# Patient Record
Sex: Female | Born: 1955
Health system: Southern US, Community
[De-identification: ages and names within clinical notes are randomized; demographics above are authoritative.]

## PROBLEM LIST (undated history)

## (undated) DIAGNOSIS — D649 Anemia, unspecified: Secondary | ICD-10-CM

## (undated) DIAGNOSIS — Z87442 Personal history of urinary calculi: Secondary | ICD-10-CM

## (undated) DIAGNOSIS — J45909 Unspecified asthma, uncomplicated: Secondary | ICD-10-CM

## (undated) DIAGNOSIS — J449 Chronic obstructive pulmonary disease, unspecified: Secondary | ICD-10-CM

## (undated) DIAGNOSIS — F32A Depression, unspecified: Secondary | ICD-10-CM

## (undated) DIAGNOSIS — I10 Essential (primary) hypertension: Secondary | ICD-10-CM

## (undated) DIAGNOSIS — K219 Gastro-esophageal reflux disease without esophagitis: Secondary | ICD-10-CM

## (undated) DIAGNOSIS — F419 Anxiety disorder, unspecified: Secondary | ICD-10-CM

## (undated) DIAGNOSIS — E039 Hypothyroidism, unspecified: Secondary | ICD-10-CM

## (undated) HISTORY — PX: ABDOMINAL HYSTERECTOMY: SHX81

## (undated) HISTORY — PX: CHOLECYSTECTOMY: SHX55

---

## 2002-11-28 ENCOUNTER — Encounter: Admission: RE | Admit: 2002-11-28 | Discharge: 2002-11-28 | Payer: Self-pay | Admitting: Family Medicine

## 2002-11-28 ENCOUNTER — Encounter: Payer: Self-pay | Admitting: Family Medicine

## 2002-12-03 ENCOUNTER — Encounter: Payer: Self-pay | Admitting: Family Medicine

## 2002-12-03 ENCOUNTER — Encounter: Admission: RE | Admit: 2002-12-03 | Discharge: 2002-12-03 | Payer: Self-pay | Admitting: Family Medicine

## 2006-11-20 ENCOUNTER — Inpatient Hospital Stay (HOSPITAL_COMMUNITY): Admission: EM | Admit: 2006-11-20 | Discharge: 2006-11-30 | Payer: Self-pay | Admitting: Emergency Medicine

## 2006-11-23 ENCOUNTER — Ambulatory Visit: Payer: Self-pay | Admitting: Internal Medicine

## 2006-11-27 ENCOUNTER — Encounter (INDEPENDENT_AMBULATORY_CARE_PROVIDER_SITE_OTHER): Payer: Self-pay | Admitting: *Deleted

## 2006-12-12 ENCOUNTER — Ambulatory Visit: Payer: Self-pay | Admitting: Internal Medicine

## 2006-12-12 LAB — CONVERTED CEMR LAB
ALT: 32 units/L (ref 0–40)
AST: 22 units/L (ref 0–37)
Alkaline Phosphatase: 129 units/L — ABNORMAL HIGH (ref 39–117)
Total Protein: 7.1 g/dL (ref 6.0–8.3)

## 2007-05-17 ENCOUNTER — Ambulatory Visit: Payer: Self-pay | Admitting: Internal Medicine

## 2007-06-14 ENCOUNTER — Encounter: Admission: RE | Admit: 2007-06-14 | Discharge: 2007-06-14 | Payer: Self-pay | Admitting: Family Medicine

## 2010-08-17 ENCOUNTER — Emergency Department (HOSPITAL_COMMUNITY): Admission: EM | Admit: 2010-08-17 | Discharge: 2010-08-18 | Payer: Self-pay | Admitting: Emergency Medicine

## 2011-03-25 NOTE — Discharge Summary (Signed)
NAME:  Gwendolyn [name], Gwendolyn [name]            ACCOUNT NO.:  [id number]   MEDICAL RECORD NO.:  [id number]          PATIENT TYPE:  INP   LOCATION:  5731                         FACILITY:  [facility]   PHYSICIAN:  Madaline Savage, MD        DATE OF BIRTH:  Aug 22, 1956   DATE OF ADMISSION:  11/19/2006  DATE OF DISCHARGE:                               DISCHARGE SUMMARY   INTERIM DISCHARGE SUMMARY   Date discharge yet to be determined.   PRIMARY CARE PHYSICIAN:  Dr. Artis Flock.   CONSULTATION IN THE HOSPITAL:  1. Dr. Rennis Harding from surgery.  2. Dr. Leone Payor from gastroenterology.   DISCHARGE DIAGNOSES:  1. Acute biliary pancreatitis.  2. Cholelithiasis status post laparoscopic cholecystectomy.  3. Hypothyroidism.  4. Malnutrition.  5. Tobacco abuse.   DISCHARGE MEDICATIONS:  This will be dictated at the time of discharge.   HISTORY OF PRESENT ILLNESS:  Gwendolyn [name] is a 55 year old lady who  presented with epigastric pain and nausea and vomiting which started the  day prior to admission. She states she had a similar episode around  Christmas, but it was a lesser intensity.   The initial work up showed a lipase of 1700 and her white count was  elevated at 19.2.   She had an ultrasound of the gallbladder which showed gallstones.   She was admitted with the diagnosis of gallstone pancreatitis and GI and  surgery was consulted.   PROCEDURES DONE IN THE HOSPITAL:  1. She had laparoscopic cholecystectomy done on the 20th of January      2008 by Dr. Jamey Ripa.  2. She had an ultrasound of the abdomen done on the 14th of January      2008 which showed cholelithiasis, left kidney lower pole calculus.      She had a CT of the abdomen done on the 14th of January 2008 which      showed edema and fluid within the anterior hepatorenal space which      is most likely secondary to pancreatitis. There was no evidence of      necrosis or peripancreatic fluid collection. Gallstones without      evidence of cholecystitis.  Bilateral nonobstructive renal calculi      and small amount of ascites.  3. She had a repeat CT scan done on the 16th of January 2008 which      showed interval worsening of acute pancreatitis compared to the CT      2 days ago.  4. In the intraoperative cholangiogram done on the 20th of January      2008, it showed lack of opacification of the distal common duct,      but there was no flow of contrast into the duodenum. There is a      suggestion of multiple, small poorly visualized filling defects in      the distal common duct suspicious for retained ductal stones.   PROBLEM LIST:  1. Biliary pancreatitis. This is a 55 year old lady who came in with      abdominal pain, nausea and vomiting, elevated amylase, lipase. She  was admitted. She was given bowel rest. Gastroenterology and      surgery was consulted. Surgery wanted to go ahead and take the      gallbladder out as she had gallstones. She was given intravenous      fluids and we started total parenteral nutrition on her. We kept      her nothing by mouth. We repeated a second CT scan which showed      worsening of the pancreatitis, but she clinically improved and on      the 20th of January 2008, the cholecystectomy was done. An      intraoperative cholangiogram was done which showed multiple, small      filling defects which is suspicious of retained stones. So,      gastroenterology was reconsulted to do a possible ERCP. At this      point of time, gastroenterology is planning to do the ERCP and we      will await the results of the ERCP at this point of time.  2. Cholelithiasis. She did not have any evidence of cholecystitis, but      with her biliary pancreatitis, we did a laparoscopic      cholecystectomy on her. She is doing well postoperatively.  3. Hypothyroidism. Will continue on Synthroid as before.  4. Nutrition. At this point of time, she is on total parenteral      nutrition. We are waiting for the ERCP to be  done. Once the ERCP is      done and she clinically improves, we will start her on by mouth      diet and we will wean off the total parenteral nutrition as she      increases her by mouth intake.   DISPOSITION:  This will be dictated in detail at the time of discharge.  At this time, we are waiting on the ERCP and for to improve her by mouth  intake. Please see the final dictation at the time of discharge for a  complete list of discharge diagnoses and discharge medications.      Madaline Savage, MD  Electronically Signed     PKN/MEDQ  D:  11/28/2006  T:  11/28/2006  Job:  [id number]

## 2011-03-25 NOTE — Consult Note (Signed)
NAME:  Gwendolyn [name], Gwendolyn [name]            ACCOUNT NO.:  [id number]   MEDICAL RECORD NO.:  [id number]          PATIENT TYPE:  INP   LOCATION:  5731                         FACILITY:  [facility]   PHYSICIAN:  Iva Boop, MD,FACGDATE OF BIRTH:  31-Oct-1956   DATE OF CONSULTATION:  11/20/2006  DATE OF DISCHARGE:                                 CONSULTATION   Requesting physician is Dr. Corky Downs, [facility] hospitalist, who is  admitting for  Dr. Artis Flock.   REASON FOR CONSULTATION:  Pancreatitis.   ASSESSMENT:  A 55 year old white woman with cholelithiasis and what  looks like gallstone pancreatitis. She does not appear to have  choledochal lithiasis. CT scan has shown changes of pancreatitis but no  dilated bile duct either.   RECOMMENDATIONS/PLAN:  1. Agree with your current care.  2. Would proceed with surgical consultation as I think cholecystectomy      would be appropriate.  3. No role for ERCP at this time, but we will monitor.  4. She is over 55 and colon cancer screening ought to be considered at      some point when she recovers. She does have a family history of      carcinoid tumors of the gut in her mother, maternal grandfather,      and an uncle. I do not think this changes any of  her screening      indications. The chart did indicate a family history of colon      cancer, but it is carcinoid.   HISTORY OF PRESENT ILLNESS:  A 55 year old white woman that presented  with epigastric pain that started the day before admission (January 13  or January 14) associated with nausea and vomiting. She had been having  some symptoms on and off postprandial in nature. She vomited twice on  the day of admission. Her symptoms really all began intermittently with  the pain at around Christmastime. She was brought to the emergency room  complaining of severe epigastric and left upper quadrant pain. She says  she feels about the same at this point though somewhat better perhaps.  She did vomit  today after drinking CT contrast.   DRUG ALLERGIES:  MORPHINE.   MEDICATIONS:  1. Lovenox 40 mg daily.  2. Synthroid 150 mcg daily.  3. Nicoderm patch 21 mg daily.  4. Protonix 40 mg IV daily.  5. Dilaudid.  6. Zofran,  7. Phenergan p.r.n.   PAST MEDICAL HISTORY:  Hypothyroidism, smoker, cesarean section x2,  total abdominal hysterectomy, prior lithotripsy for kidney stones.   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 She smokes a pack per day.   FAMILY HISTORY:  Carcinoid tumor is as above.   REVIEW OF SYSTEMS:  She feels a little weak. She has those things  mentioned above. All other systems are negative.   PHYSICAL EXAMINATION:  GENERAL:  Reveals a slightly ill-appearing middle-  aged white woman.  VITAL SIGNS:  Temperature 98.7, pulse 87, respirations 16, blood  pressure 118/68.  HEENT:  Eyes anicteric. ENT:  Normal mouth and  pharynx.  NECK:  Supple, no thyroid mass.  CHEST:  Clear.  HEART:  S1, S2, no murmurs, rubs or gallop.  ABDOMEN:  Soft. It is mildly distended. Bowel sounds are  quiet/hypoactive. She is tender in the epigastrium and left upper  quadrant without guarding or rebound.  EXTREMITIES:  Show no cyanosis, clubbing or edema.  NEUROLOGICAL/PSYCHIATRIC:  She is alert and oriented x3.  LYMPH NODES:  No neck or supraclavicular nodes palpated.   LABORATORY STUDIES:  Lipase was 1796 and 1185 today. Bilirubin 0.8-0.7,  alk phos 89-70. AST 88-51, ALT 132-96. White blood cell count is 19,000,  hemoglobin 16, platelets 248. BMET was normal except glucose 112. She  has a cardiac panel that is negative. Urinalysis shows 3-6 white cells,  a few squames, small amount of leukocytes on the dip, 30 mg of protein.  Specific gravity 1.023.   I appreciate the opportunity to care for this patient.      Iva Boop, MD,FACG  Electronically Signed     CEG/MEDQ  D:  11/20/2006  T:  11/21/2006  Job:  [id number]   cc:   Mobolaji  B. Corky Downs, M.D.  Hulan Saas, M.D.

## 2011-03-25 NOTE — Discharge Summary (Signed)
NAME:  Gwendolyn [name], Gwendolyn [name]            ACCOUNT NO.:  [id number]   MEDICAL RECORD NO.:  [id number]          PATIENT TYPE:  INP   LOCATION:  5734                         FACILITY:  [facility]   PHYSICIAN:  Beckey Rutter, MD  DATE OF BIRTH:  1956/01/28   DATE OF ADMISSION:  11/19/2006  DATE OF DISCHARGE:  11/30/2006                               DISCHARGE SUMMARY   ADDENDUM:   HOSPITAL COURSE:  This is the addendum to the discharge summary dictated  previously by Dr. Oneita Hurt from the period of November 28, 2006 up to  the discharge date on December 01, 2006.  The patient was continued in TNA.  The TNA was stopped when the patient  was eating and she was tolerating p.o. intake in a good manner.  The  patient had ERCP done with removal of common bile duct stone.   DISCHARGE DIAGNOSIS:  Please refer to the previously dictated discharge  summary.   DISCHARGE MEDICATIONS:  1. Vicodin for pain control p.r.n.  2. Prevacid 30 mg p.o. daily.   PLAN:  The patient is stable for discharge today to follow up with her  primary and her gastroenterologist as discussed with her.      Beckey Rutter, MD  Electronically Signed     EME/MEDQ  D:  02/02/2007  T:  02/03/2007  Job:  [id number]

## 2011-03-25 NOTE — Op Note (Signed)
NAME:  Gwendolyn [name], Gwendolyn [name]            ACCOUNT NO.:  [id number]   MEDICAL RECORD NO.:  [id number]          PATIENT TYPE:  INP   LOCATION:  5731                         FACILITY:  [facility]   PHYSICIAN:  Currie Paris, M.D.DATE OF BIRTH:  02-18-1956   DATE OF PROCEDURE:  11/26/2006  DATE OF DISCHARGE:                               OPERATIVE REPORT   PREOPERATIVE DIAGNOSIS:  Biliary pancreatitis, cholelithiasis.   POSTOPERATIVE DIAGNOSIS:  Biliary pancreatitis, cholelithiasis with  acute cholecystitis.   OPERATION:  Laparoscopic cholecystectomy with operative cholangiogram.   SURGEON:  Dr. Jamey Ripa   ASSISTANT:  Dr. Abbey Chatters   ANESTHESIA:  General.   CLINICAL HISTORY:  This is a 50-year lady recently presented with  biliary pancreatitis.  Clinically she was improving, although her lipase  and white count were still slightly elevated.  She was having no pain,  was hungry, and abdomen was benign.  She is known to have gallstones.  We elected at this time to proceed with her laparoscopic  cholecystectomy.   DESCRIPTION OF PROCEDURE:  The patient was seen in the holding area.  She had no further questions.  She is taken into the operating room  after satisfactory general endotracheal anesthesia had been obtained the  abdomen was prepped and draped.  The time-out occurred.   0.25% plain Marcaine was used for each incision.  The umbilical incision  was made, the fascia opened and the peritoneal cavity entered.  A  pursestring was placed, Hasson introduced and the abdomen insufflated to  15.   There were a few omental adhesions to the midline from prior surgery but  I was able to get around those easily and visualized the upper abdomen.  With the patient reverse Trendelenburg and tilted to the left I was able  to place a 10/11 trocar in the epigastrium and two 5 mm laterally under  direct vision.  The gallbladder was chronically inflamed with some edema  and some signs of early  inflammation as well.   Multiple omental adhesions were taken down and the gallbladder was able  to be retracted over the liver.  I was able to open the peritoneum on  either side of the triangle of Calot and identify nice long cystic duct  and cystic artery.  I placed a single clip on the cystic artery in its  mid position and one on the cystic duct at the junction with the  gallbladder.   A Cook catheter was introduced percutaneously.  The cystic duct was  opened and the catheter placed in it and held with a clip.   Angiography showed good filling of the common duct and hepatic radicals.  We had no definite filling defects and a fairly long segment of cystic  duct.  There was initially no flow of dye into the duodenum.  I could  not see definite stone distally however.   We injected 1 ampule of Glucagon and I redid the films at 2 and 4  minutes post injection and again found no dye going into the duodenum.  I thought this represented spasm but there could have been some small  stones as well.   The cystic duct catheter was removed and three clips placed across the  stay side of the cystic duct being sure we had these well across.  The  cystic duct was divided.  Additional clips were placed on the cystic  artery and it was divided.  Small posterior branch was identified,  clipped and divided and the gallbladder removed from below to above with  coagulation current cautery.   I placed it in a bag temporarily and then irrigated and made sure the  bed of the gallbladder was completely dry.  Once that was the case.  I  brought this out the umbilical port.   We reoccluded the umbilical port and did a final irrigation final check  for hemostasis and again things remained dry with no evidence of  bleeding or bile leaks.  The lateral ports were removed under direct  vision.  There was no bleeding.  The umbilical port was closed with the  pursestring keeping the camera in epigastric port  to be sure no bowel  got incorporated in the closure.  The abdomen was then deflated through  the epigastric port.  Skin was closed with 4-0 Monocryl subcuticular  plus Dermabond.   The patient tolerated the procedure well.  There were no operative  complications.  All counts were correct.      Currie Paris, M.D.  Electronically Signed     CJS/MEDQ  D:  11/26/2006  T:  11/26/2006  Job:  [id number]   cc:   Iva Boop, MD,FACG  Quita Skye. Artis Flock, M.D.

## 2011-03-25 NOTE — Consult Note (Signed)
NAME:  Gwendolyn [name], Gwendolyn [name]            ACCOUNT NO.:  [id number]   MEDICAL RECORD NO.:  [id number]          PATIENT TYPE:  INP   LOCATION:  5731                         FACILITY:  [facility]   PHYSICIAN:  Revonda Standard L. Rennis Harding, N.P. DATE OF BIRTH:  December 09, 1955   DATE OF CONSULTATION:  DATE OF DISCHARGE:                                 CONSULTATION   CONSULTING SURGEON:  Dr. Zachery Dakins.   PRIMARY CARE PHYSICIAN:  Dr. Penni Bombard.   GASTROINTESTINAL PHYSICIAN:  Dr. Leone Payor.   REASON FOR CONSULTATION:  Biliary pancreatitis.   HISTORY OF PRESENT ILLNESS:  Gwendolyn [name] is a 50-year female patient,  history of hypothyroidism, who has been experiencing intermittent  abdominal pain with nausea, at least 4 to 5 episodes since Christmas of  this year.  She had a significant episode on November 19, 2006 after  eating.  This prompted an evaluation at the ER, because of severe left  upper quadrant pain.  Evaluation there revealed a lipase greater than  1,700, white count 19,200.  The patient was admitted by [facility].  Diagnostic workup included ultrasound and CT of the  abdomen and pelvis on January 14.  Both of these were consistent with  pancreatitis, no necrosis, and cholelithiasis without evidence of acute  cholecystitis.  Gastroenterology was consulted, and after their  evaluation they recommended surgical evaluation for biliary  pancreatitis.   REVIEW OF SYSTEMS:  As above, the patient was having fevers at home  without chills or myalgias, otherwise noncontributory.   PAST MEDICAL HISTORY:  Hypothyroidism.   PAST SURGICAL HISTORY:  C section x2 and hysterectomy.   SOCIAL HISTORY:  Positive for tobacco.  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   FAMILY HISTORY:  Noncontributory.   ALLERGIES:  Morphine.   CURRENT MEDICATIONS:  Include:  1. Synthroid.  2. Nicotine patch.  3. Subcu Lovenox for DVT prophylaxis/  4. Protonix IV and several p.r.n. medications.   PHYSICAL EXAM:   GENERAL:  Pleasant female patient still complaining of  left upper quadrant pain, although decreased from prior to admission.  VITAL SIGNS:  Temperature 98.5, BP 133/63, pulse 89 and regular,  respirations 20.  NEURO:  The patient is alert, oriented x3, moving all extremities x4  without focal deficits.  HEENT:  Head normocephalic, sclera non-injected, nonicteric.  NECK:  Supple.  No adenopathy.  CHEST:  Bilateral lung sounds clear to auscultation.  Respiratory effort  is non-labored.  She is on room air.  CARDIAC:  S1, S2 without rubs, murmurs, thrills or gallops.  No JVD.  Pulses is regular, non-tachycardiac.  ABDOMEN:  Soft, nondistended, tender in the left upper quadrant with  mild guarding, no rebounding.  Bowel sounds are present.  No obvious  hepatoplenomegaly, masses or bruits on clinical exam.  EXTREMITIES:  Symmetrical in appearance without edema, cyanosis or  clubbing.  Pulses are palpable bilaterally.   LABORATORY:  Hemoglobin A1c is 5.4.  Urine cultures pending.  Urinalysis  is consistent with borderline UTI.  Lipase is down to 1,185.  AST was  88, now 51.  ALT 132, now 96.  Total bilirubin  has been normal today to  0.7.  Sodium 137, potassium 4.0, CO2 26, glucose 112, BUN 15, creatinine  0.72.  White count on admission 19,200, hemoglobin 16.1, platelets  248,000.  Diagnostic CT and ultrasound of the abdomen and pelvis region  again showed stones without evidence of cholecystitis and pancreatitis.   IMPRESSION:  1. Acute biliary pancreatitis.  2. Cholelithiasis without evidence of acute cholecystitis.  3. Hypothyroidism.   PLAN:  1. Will proceed to the OR later this week, after the patient's      pancreatitis improves.  2. Will go ahead and check CMET, CBC, lipase and amylase today, as      well as in the morning.  3. Recommend continuing n.p.o. status for now.      Allison L. Rennis Harding, N.P.     ALE/MEDQ  D:  11/21/2006  T:  11/21/2006  Job:  [id number]   cc:    Dr. Penni Bombard

## 2011-03-25 NOTE — Consult Note (Signed)
NAME:  Gwendolyn [name], Gwendolyn [name]            ACCOUNT NO.:  [id number]   MEDICAL RECORD NO.:  [id number]          PATIENT TYPE:  INP   LOCATION:  5731                         FACILITY:  [facility]   PHYSICIAN:  Hedwig Morton. Juanda Chance, MD     DATE OF BIRTH:  1956/06/08   DATE OF CONSULTATION:  DATE OF DISCHARGE:                                 CONSULTATION   NAME OF PROCEDURE:  ERCP, sphincterotomy, and stone extraction.   INDICATIONS:  This 55 year old female was hospitalized with acute  biliary pancreatitis.  She was treated with conservative measures, which  included bowel rest, antibiotics, and central TNA.  She underwent  cholecystectomy, laparoscopic cholecystotomy for cholelithiasis 3 days  ago.  Intraoperative cholangiogram showed failure of the radial PEG  material to reach the duodenum raising a question of retaining common  bile duct stones versus pancreatitis obstructing the common bile duct.  Preoperatively, her liver function test and pancreatic enzymes improved,  but since the surgery the amylase as well as the enzymes have been  elevated.  Patient is undergoing ERCP to rule out possibility of  retained common bile duct stone.   ENDOSCOPE:  Olympus single-channel side-viewing duodenoscope.   SEDATION:  Versed 18 mg IV, fentanyl 175 mcg IV, and Glucagon 0.5 mg IV.   PROCEDURE IN DETAIL:  Olympus single-channel side-viewing duodenoscope  passed blindly through the posterior pharynx to the esophagus, through  the stomach, and into the duodenum.  A minor papilla was identified  lying proximally to the major papilla.  Major papilla was visualized  immediately and was rather edematous.  The duodenum itself appeared  normal.  It was cannulated without difficulty.  Bile was exuding from  the papilla.  Common bile duct was cannulated with a guidewire, and a  cholangiogram showed normal-sized common bile duct with questionable  retained distal common bile duct stone.  A standard sphincterotomy was  done over the guidewire, and a 15-mm Wilson-Coojk  balloon passed into  the common bile duct under fluoroscopic guidance. The bile duct was  swept several time with the recovery of a solitary 8-mm stone.  Photographs of the stone were obtained.  Additionally, 2 sweeps of the  bile duct showed the common bile duct to be free of any retained  material.  At that point, procedure was terminated.  Patient tolerated  the procedure well.   IMPRESSION:  1. Choledocholithiasis.  2. Status post endoscopic retrograde cholangiopancreatography with      sphincterotomy and sweep of the bile duct      with a 15-mm balloon.  3. Intentional non visualization of the main pancreatic duct.   PLAN:  See patient's chart for further plan of treatment.      Hedwig Morton. Juanda Chance, MD  Electronically Signed     DMB/MEDQ  D:  11/28/2006  T:  11/28/2006  Job:  [id number]   cc:   Currie Paris, M.D.  Quita Skye Artis Flock, M.D.

## 2011-03-25 NOTE — Assessment & Plan Note (Signed)
[facility]                         GASTROENTEROLOGY OFFICE NOTE   NAME:[name], Gwendolyn LAFAVE                   MRN:          [id number]  DATE:12/12/2006                            DOB:          12-28-1955    CHIEF COMPLAINT:  Followup after pancreatitis, chololithiasis, and  choledocholithiasis.   Gwendolyn [name] had a cholecystectomy. She had a stone removed at ERCP by  Dr. Juanda Chance on November 28, 2006. After that hospitalization from January  13, to January 23 she has done well. She feels well at this point. Her  LFTs were up at the time of discharge and have not yet been rechecked.  She is back to work with Dr. Artis Flock.   PAST MEDICAL HISTORY:  1. Recent pancreatitis, secondary to chololithiasis and      choledocholithiasis status post successful cholecystectomy in ERCP      with removal of common bile duct stone.  2. Hypothyroidism.  3. Smoker.  4. Caesarian section x2.  5. Total abdominal hysterectomy.  6. Prior lithotripsy for nephrolithiasis.   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   FAMILY HISTORY:  Mother had a carcinoid tumor, father has had colon  polyps. Her maternal grandfather and an uncle had carcinoid as well.   PHYSICAL EXAMINATION:  She is in no acute distress, height 4 feet 11.5  inches, weight 143 pounds, blood pressure 110/70, pulse 85.   ASSESSMENT:  1. As above she is recovering from her pancreatitis and      choledocholithiasis, and chololithiasis and is recovering from      surgery.  2. She is at an appropriate age to have a screening colonoscopy.   PLAN:  1. Check LFTs today.  2. Keep her follow up with Dr. Jamey Ripa as planned.  3. She will call back to arrange a colonoscopy. She does not need to      see me again, she can come to the nurse and set that up directly.     Iva Boop, MD,FACG  Electronically Signed    CEG/MedQ  DD: 12/12/2006  DT:  12/12/2006  Job #: [id number]   cc:   Currie Paris, M.D.

## 2011-03-25 NOTE — Consult Note (Signed)
NAME:  Gwendolyn [name], Gwendolyn [name]            ACCOUNT NO.:  [id number]   MEDICAL RECORD NO.:  [id number]          PATIENT TYPE:  INP   LOCATION:  5731                         FACILITY:  [facility]   PHYSICIAN:  Anselm Pancoast. Weatherly, M.D.DATE OF BIRTH:  [birth year]   DATE OF CONSULTATION:  DATE OF DISCHARGE:                                 CONSULTATION   CHIEF COMPLAINT:  Pancreatitis/gallstones.   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  The patient states ever  since Christmas, she has had episodes of epigastric pain that would be  quite uncomfortable, and then the pain would appear to subside.  Because  of the increasing pain, she was admitted by Dr. Penni Bombard on the 13th with  laboratory studies that showed an elevated lipase.  She had an  ultrasound of the abdomen which showed gallstones, but a very small  common bile duct and no definite stone in her common bile duct.  As far  as on the initial ultrasound, the pancreas body was thought to be  unremarkable, but she was quite gaseous and it was not visualized very  well.  She has a past history of having problems with a goiter, from  which she was on hyperthyroid treatment of radioactive iodine and is  presently controlled with Synthroid 150 mcg a day.  She says __________  hyperactivity.  The patient is widowed, she has 2 grandchildren, and she  works with Dr. Penni Bombard.  She does smoke and does not use alcohol.  She  has no history of hypertension, hyperlipidemia or __________.   On laboratory studies on admission, she had a markedly elevated white  count of 19,200 __________ was not elevated, and her alkaline  phosphatase and SGPT  mildly elevated.  She was admitted and placed on  intravenous fluids and a gastroenterology consultation was requested,  and she was seen by Dr. Leone Payor yesterday, who  thought that this was  kind of a straight-forward gallstone pancreatitis with no indication for  a  ERCP, and he called and asked me to see the patient the following  [name].  I did see Gwendolyn [name].  She said that the pain  appears to be possibly a little better.  She is still bothered with  epigastric pain, more to the left of midline, and her laboratory  studies, it does appear that her amylase is definitely decreasing.  Amylase was about 500 today and the lipase was over 2000, which dropped  to about 129.  Her white count, however, was still significantly  elevated, but on physical exam, she is not acutely tender over or around  her gallbladder and this appears to be acute pancreatitis that is  resolving.  I would recommend that:  1. We continue to keep her n.p.o., keep her well-hydrated with IV      fluids, and if amylase was significantly close or only mildly      elevated, I would proceed on with an urgent laparoscopic  cholecystectomy and cholangiogram, probably on about Thursday.  If      she was having more pain in the right upper quadrant with elevated      white count, we possibly would need to proceed sooner, but      clinically, I do not think she has acute cholecystitis, although      she has acute pancreatitis, which enzymatically appears to be      improving.  The patient is n.p.o. with ice chips.  We would recheck      her first thing in the [name], and then hopefully can continue      with this      probable cholecystectomy on Thursday.  The patient is in agreement      with this.  Whether she is actually significantly improved or      whether her pain is decreased because of her pain medication which      is a little more frequently given, I am not sure.           ______________________________  Anselm Pancoast. Zachery Dakins, M.D.     WJW/MEDQ  D:  11/21/2006  T:  11/22/2006  Job:  [id number]

## 2011-03-25 NOTE — H&P (Signed)
NAME:  TAYELOR, OSBORNE            ACCOUNT NO.:  [id number]   MEDICAL RECORD NO.:  [id number]          PATIENT TYPE:  EMS   LOCATION:  MAJO                         FACILITY:  [facility]   PHYSICIAN:  Mobolaji B. Bakare, M.D.DATE OF BIRTH:  Mar 08, 1956   DATE OF ADMISSION:  11/19/2006  DATE OF DISCHARGE:                              HISTORY & PHYSICAL   PRIMARY CARE PHYSICIAN:  Dr. Artis Flock.   CHIEF COMPLAINT:  Epigastric pain.   HISTORY OF PRESENT ILLNESS:  Ms. Digiulio is a 55 year old Caucasian  female presenting with epigastric pain which started yesterday.  It is  nonradiating.  It is associated with nausea and vomiting.  She vomited  twice yesterday and has been unable to keep anything down.  No  accompanying diarrhea.  There is no chest pain or diaphoresis.  The pain  is rated 7/10.  It is chronic in nature and was only relieved by  analgesia in the emergency room.  The patient had similar episode of  pain but obviously to a lesser degree over Christmas but it went away.  She had oatmeal for breakfast prior to onset of symptoms and she has not  had lunch or supper.   Here in the emergency room, the patient had initial blood work that  revealed lipase of over 1000.  She had an ultrasound of the abdomen  which showed cholelithiasis but the common bile duct is 4 mm and there  is choledocholithiasis.  The pancreas body was said to be normal.  The  pancreatic tail was obscured by gas.   REVIEW OF SYSTEMS:  The patient had accompanying fever earlier today but  no chills.  She did not check her temperature then.  There was no chest  pain, shortness of breath, no diaphoresis and no headaches or diarrhea.  She has no dysuria or urgency.   PAST MEDICAL HISTORY:  Hypothyroidism.   PAST SURGICAL HISTORY:  1. Two cesarean sections.  2. Hysterectomy.   CURRENT MEDICATIONS:  Synthroid 150 mcg daily.   ALLERGIES:  MORPHINE causes hyperactivity.   FAMILY HISTORY:  Mother passed away from  colon cancer at the age of 36.  Father is alive.  He has diabetic and end-stage renal disease.  He is on  hemodialysis.   SOCIAL HISTORY:  The patient is a widowed.  She has two grown up  children.  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  She smokes  cigarettes one pack per day and she has been smoking since the age of  16.  She does not drink alcohol.  She drinks occasionally.   INITIAL VITALS:  Temperature 97.5, blood pressure 124/70, pulse of 82,  respiratory rate of 24, O2 saturation 97%.  EXAMINATION:  She is awake, alert, oriented to time, place and person.  Normocephalic and atraumatic head.  Pupils are equal, round, and  reactive to light.  Extraocular muscle movement intact.  She is  nonicteric.  LUNGS:  Clear clinically to auscultation.  CVS:  S1-S2 regular.  No murmur, no gallop.  ABDOMEN:  Obese, soft.  There is epigastric tenderness.  No rebound or  guarding.  Bowel sounds present.  EXTREMITIES:  No pedal edema or calf tenderness.  Dorsalis pedis pulses  2+ bilaterally.  CNS:  There is no focal neurological deficit.   INITIAL LABORATORY DATA:  Lipase 1796, albumin 4.0, total protein 6.6,  AST 88, ALT 132, alkaline phosphate 89, total bilirubin 0.8.  White cell  19.2, hemoglobin 16.1, hematocrit 47.3, platelets 246, MCV 89.8.  Absolute granulocyte count is 17.6.  Urinalysis clear and negative for  nitrites.  There are small leukocytes.  Microscopy showed mainly urinary  epithelial cells, many bacteria and white cells of 3-6.  Sodium 136,  potassium 3.5, chloride 103, BUN 16, creatinine is unavailable.  Abdominal x-ray showed left lower pole 2.5 mm renal calculus, mild  bibasilar and subsegmental atelectasis, low lung volumes, no significant  dilated bowels or abdominal air fluid levels noted.  Ultrasound of the  abdomen showed cholelithiasis.  The left kidney lower pole calculus  showed on conventional radiography was not evident on the sonography.  The pancreatic  tail, mid and distal abdominal aorta are obscured due to  overlying bowel gas.  The body of the pancreas is normal.  The common  bile duct measures 4 mm.  No visualized choledocholithiasis or  intrahepatic bile duct dilatation.   ASSESSMENT/PLAN:  Ms. Kley is a 55 year old Caucasian female  __________ who is presenting with epigastric pain.  She has elevated  lipase of 1796.  Ultrasound of the gallbladder showed gallstones without  choledocholithiasis.  She has leukocytosis of 19,000.   ADMISSION DIAGNOSES:  1. Gallstone pancreatitis:  We will keep the patient n.p.o. and IV      fluid normal saline at 150 mL per hour and Dilaudid 0.5 to 1 mg IV      q.4h. p.r.n. pain, Phenergan 12.5 mg q.4h. p.r.n.  We will check      fasting lipid profile.  We will check lipase in the morning.  We      will obtain GI and surgical consults.  2. Elevated transaminases likely secondary to gallstones:  The patient      probably has passed a stone.  We will monitor liver function tests.  3. Cholelithiasis:  The patient will eventually need gallbladder      surgery.  4. Mild pyuria:  The patient is asymptomatic.  The UA showed a lot of      epithelial cells which may suggest inappropriate collection.  We      will repeat urinalysis and send for culture.  I am holding off      starting antibiotics at this point.  5. Tobacco abuse:  We will place on nicotine patch 21 mg daily and      offer tobacco cessation counseling.  6. Hypothyroidism:  We will resume Synthroid.      Mobolaji B. Corky Downs, M.D.  Electronically Signed     MBB/MEDQ  D:  11/20/2006  T:  11/20/2006  Job:  [id number]   cc:   Quita Skye. Artis Flock, M.D.

## 2014-07-22 ENCOUNTER — Other Ambulatory Visit: Payer: Self-pay

## 2014-07-22 DIAGNOSIS — Z1231 Encounter for screening mammogram for malignant neoplasm of breast: Secondary | ICD-10-CM

## 2014-08-12 ENCOUNTER — Ambulatory Visit: Payer: Self-pay

## 2016-02-15 ENCOUNTER — Other Ambulatory Visit: Payer: Self-pay | Admitting: Internal Medicine

## 2016-02-15 DIAGNOSIS — R52 Pain, unspecified: Secondary | ICD-10-CM

## 2016-02-18 ENCOUNTER — Ambulatory Visit
Admission: RE | Admit: 2016-02-18 | Discharge: 2016-02-18 | Disposition: A | Discharge status: Home / Self Care | Payer: BLUE CROSS/BLUE SHIELD | Source: Ambulatory Visit | Attending: Internal Medicine | Admitting: Internal Medicine

## 2016-02-18 DIAGNOSIS — R52 Pain, unspecified: Secondary | ICD-10-CM

## 2018-11-07 HISTORY — PX: OTHER SURGICAL HISTORY: SHX169

## 2018-11-07 HISTORY — PX: COLONOSCOPY: SHX174

## 2020-09-15 DIAGNOSIS — R10815 Periumbilic abdominal tenderness: Secondary | ICD-10-CM | POA: Diagnosis not present

## 2020-09-16 ENCOUNTER — Ambulatory Visit: Payer: Self-pay | Admitting: Surgery

## 2020-09-16 DIAGNOSIS — K429 Umbilical hernia without obstruction or gangrene: Secondary | ICD-10-CM | POA: Diagnosis not present

## 2020-10-12 DIAGNOSIS — E039 Hypothyroidism, unspecified: Secondary | ICD-10-CM | POA: Diagnosis not present

## 2020-10-12 DIAGNOSIS — I119 Hypertensive heart disease without heart failure: Secondary | ICD-10-CM | POA: Diagnosis not present

## 2020-10-12 DIAGNOSIS — K429 Umbilical hernia without obstruction or gangrene: Secondary | ICD-10-CM | POA: Diagnosis not present

## 2020-10-26 ENCOUNTER — Encounter (HOSPITAL_COMMUNITY): Payer: Self-pay

## 2020-10-26 ENCOUNTER — Other Ambulatory Visit (HOSPITAL_COMMUNITY)
Admission: RE | Admit: 2020-10-26 | Discharge: 2020-10-26 | Disposition: A | Discharge status: Home / Self Care | Payer: PPO | Source: Ambulatory Visit | Attending: Surgery | Admitting: Surgery

## 2020-10-26 ENCOUNTER — Encounter (HOSPITAL_COMMUNITY)
Admission: RE | Admit: 2020-10-26 | Discharge: 2020-10-26 | Disposition: A | Discharge status: Home / Self Care | Payer: PPO | Source: Ambulatory Visit | Attending: Surgery | Admitting: Surgery

## 2020-10-26 ENCOUNTER — Other Ambulatory Visit: Payer: Self-pay

## 2020-10-26 DIAGNOSIS — Z01818 Encounter for other preprocedural examination: Secondary | ICD-10-CM | POA: Insufficient documentation

## 2020-10-26 DIAGNOSIS — Z79899 Other long term (current) drug therapy: Secondary | ICD-10-CM | POA: Insufficient documentation

## 2020-10-26 DIAGNOSIS — E039 Hypothyroidism, unspecified: Secondary | ICD-10-CM | POA: Insufficient documentation

## 2020-10-26 DIAGNOSIS — K429 Umbilical hernia without obstruction or gangrene: Secondary | ICD-10-CM | POA: Insufficient documentation

## 2020-10-26 DIAGNOSIS — Z8719 Personal history of other diseases of the digestive system: Secondary | ICD-10-CM | POA: Insufficient documentation

## 2020-10-26 DIAGNOSIS — K219 Gastro-esophageal reflux disease without esophagitis: Secondary | ICD-10-CM | POA: Insufficient documentation

## 2020-10-26 DIAGNOSIS — Z791 Long term (current) use of non-steroidal anti-inflammatories (NSAID): Secondary | ICD-10-CM | POA: Insufficient documentation

## 2020-10-26 DIAGNOSIS — Z79890 Hormone replacement therapy (postmenopausal): Secondary | ICD-10-CM | POA: Insufficient documentation

## 2020-10-26 DIAGNOSIS — F32A Depression, unspecified: Secondary | ICD-10-CM | POA: Insufficient documentation

## 2020-10-26 DIAGNOSIS — F419 Anxiety disorder, unspecified: Secondary | ICD-10-CM | POA: Insufficient documentation

## 2020-10-26 DIAGNOSIS — I10 Essential (primary) hypertension: Secondary | ICD-10-CM | POA: Insufficient documentation

## 2020-10-26 DIAGNOSIS — I444 Left anterior fascicular block: Secondary | ICD-10-CM | POA: Insufficient documentation

## 2020-10-26 DIAGNOSIS — Z90710 Acquired absence of both cervix and uterus: Secondary | ICD-10-CM | POA: Insufficient documentation

## 2020-10-26 DIAGNOSIS — J449 Chronic obstructive pulmonary disease, unspecified: Secondary | ICD-10-CM | POA: Insufficient documentation

## 2020-10-26 DIAGNOSIS — J45909 Unspecified asthma, uncomplicated: Secondary | ICD-10-CM | POA: Insufficient documentation

## 2020-10-26 DIAGNOSIS — Z20822 Contact with and (suspected) exposure to covid-19: Secondary | ICD-10-CM | POA: Diagnosis not present

## 2020-10-26 DIAGNOSIS — Z9049 Acquired absence of other specified parts of digestive tract: Secondary | ICD-10-CM | POA: Insufficient documentation

## 2020-10-26 DIAGNOSIS — D649 Anemia, unspecified: Secondary | ICD-10-CM | POA: Insufficient documentation

## 2020-10-26 HISTORY — DX: Gastro-esophageal reflux disease without esophagitis: K21.9

## 2020-10-26 HISTORY — DX: Anemia, unspecified: D64.9

## 2020-10-26 HISTORY — DX: Personal history of urinary calculi: Z87.442

## 2020-10-26 HISTORY — DX: Chronic obstructive pulmonary disease, unspecified: J44.9

## 2020-10-26 HISTORY — DX: Hypothyroidism, unspecified: E03.9

## 2020-10-26 HISTORY — DX: Depression, unspecified: F32.A

## 2020-10-26 HISTORY — DX: Essential (primary) hypertension: I10

## 2020-10-26 HISTORY — DX: Anxiety disorder, unspecified: F41.9

## 2020-10-26 HISTORY — DX: Unspecified asthma, uncomplicated: J45.909

## 2020-10-26 LAB — CBC
HCT: 43.8 % (ref 36.0–46.0)
Hemoglobin: 14.8 g/dL (ref 12.0–15.0)
MCH: 30.2 pg (ref 26.0–34.0)
MCHC: 33.8 g/dL (ref 30.0–36.0)
MCV: 89.4 fL (ref 80.0–100.0)
Platelets: 258 10*3/uL (ref 150–400)
RBC: 4.9 MIL/uL (ref 3.87–5.11)
RDW: 13 % (ref 11.5–15.5)
WBC: 10.1 10*3/uL (ref 4.0–10.5)
nRBC: 0 % (ref 0.0–0.2)

## 2020-10-26 LAB — BASIC METABOLIC PANEL
Anion gap: 11 (ref 5–15)
BUN: 16 mg/dL (ref 8–23)
CO2: 25 mmol/L (ref 22–32)
Calcium: 9.2 mg/dL (ref 8.9–10.3)
Chloride: 102 mmol/L (ref 98–111)
Creatinine, Ser: 0.71 mg/dL (ref 0.44–1.00)
GFR, Estimated: >60 mL/min (ref >60)
Glucose, Bld: 157 mg/dL — ABNORMAL HIGH (ref 70–99)
Potassium: 3.5 mmol/L (ref 3.5–5.1)
Sodium: 138 mmol/L (ref 135–145)

## 2020-10-26 NOTE — Progress Notes (Addendum)
Anesthesia Chart Review:  Case: [id number] Date/Time: 10/29/20 1445   Procedure: OPEN UMBILICAL HERNIA REPAIR WITH MESH (N/A )   Anesthesia type: Choice   Pre-op diagnosis: UMBILICAL HERNIA   Location: [facility] OR ROOM 09 / [facility]   Surgeons: Stechschulte, Nickola Major, MD      DISCUSSION: Patient is 64 year old female scheduled for the above procedure.  History includes smoking, COPD, HTN, asthma, GERD, hypothyroidism, anxiety, depression, anemia, gastric ulcer (2020), cholecystectomy, hysterectomy.   PAT VS showed: BP 188/81 & Pulse 106 @ 1400 BP 209/79 & Pulse 88 @ 1430 BP 211/85 Pulse 98 @ 1452 Patient stated she took BP meds (amlodipine, Edarbi) at 0600 on day of PAT appointment.  She reported BP typically higher at doctor's appointments. 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  She thinks SBP reading are usually ~ 150 but can be higher in the mornings. She denied chest pain and SOB. She does not exercise but says she can vacuum, mop, sweep without CV symptoms.   Given her elevated BP, she contacted Dr. Mellody Drown while still at her PAT visit and was advised to increase amlodipine from 5 mg per day to 10 mg. She is going to have a BP recheck at his office on 10/27/20. Copy of EKG also forwarded, although appears stable without significant ST/T waves changes. She reported was going to fax 10/27/20 BP results, but no fax received. Only voicemail option when I attempted to call her after 5:00 PM and at 6:00 PM on 10/27/20.    10/26/20 presurgical COVID-19 test was negative.   ADDENDUM 10/28/20 9:49 AM: I spoke with patient. She says that she has been having BP checks with Jilda Panda, MD daily since 10/26/20. He was increased amlodipine to 10 mg daily and changed Edarbi to [employer] 40/25 mg daily (medication list updated). BP before medications this AM was 202/98. She has taken meds and will see Dr. Mellody Drown later today for BP recheck. She is becoming increasingly stressed  about her BP and how it may effect surgery plans. Advised that if Dr. Mellody Drown is concerned about proceeding with surgery, then to please contact surgeon's office. Otherwise, she will get BP on arrival and we'll re-evaluate at that time. She said she can check BP at home, so if her SBP is ~ 200 after amlodipine on the morning of surgery then encouraged her to call [facility] to inquire if anesthesiologist wanted her to still hold Edarbi (or [employer]). Her surgery is not until the afternoon. I updated [facility] triage nurse about PAT BP readings and patient with following with PCP for medication adjustments.   VS: BP (!) 211/85   Pulse 98   Temp 36.7 C (Oral)   Resp 19   Ht 4' 11.5" (1.511 m)   Wt 77.9 kg   SpO2 98%   BMI 34.12 kg/m   PROVIDERS: Jilda Panda, MD is PCP    LABS: Labs reviewed: Acceptable for surgery. (all labs ordered are listed, but only abnormal results are displayed)  Labs Reviewed  BASIC METABOLIC PANEL - Abnormal; Notable for the following components:      Result Value   Glucose, Bld 157 (*)    All other components within normal limits  CBC    EKG: 10/26/20:  Normal sinus rhythm Left anterior fascicular block Abnormal ECG No significant change since last tracing Confirmed by Glori Bickers [phone]) on 10/27/2020 12:29:03 AM  CV: N/A  Past Medical History:  Diagnosis Date  .  Anemia   . Anxiety   . Asthma   . COPD (chronic obstructive pulmonary disease) ([place])   . Depression   . GERD (gastroesophageal reflux disease)   . History of kidney stones   . Hypertension   . Hypothyroidism     Past Surgical History:  Procedure Laterality Date  . ABDOMINAL HYSTERECTOMY    . Cesarean Section     (2)  . CHOLECYSTECTOMY    . COLONOSCOPY  2020  . Gastric Ulcer  2020    MEDICATIONS: . Acetaminophen-Caffeine (EXCEDRIN ASPIRIN FREE PO)  . amLODipine (NORVASC) 5 MG tablet  . Azilsartan Medoxomil (EDARBI) 40 MG TABS  . Dexlansoprazole (DEXILANT) 30 MG capsule   . escitalopram (LEXAPRO) 10 MG tablet  . HYDROcodone-acetaminophen (NORCO/VICODIN) 5-325 MG tablet  . ibuprofen (ADVIL) 200 MG tablet  . levothyroxine (SYNTHROID) 137 MCG tablet   No current facility-administered medications for this encounter.    Myra Gianotti, PA-C Surgical Short Stay/Anesthesiology [facility] Phone [phone] [facility] Phone [phone] 10/27/2020 6:00 PM

## 2020-10-26 NOTE — Progress Notes (Signed)
[id number], [place] [address] Phone: [phone] Fax: [phone]      Your procedure is scheduled on Thursday, December 23rd.  Report to [facility] Main Entrance "A" at 1:00 PM, and check in at the Admitting office.  Call this number if you have problems the morning of surgery:  [phone]  Call [phone] if you have any questions prior to your surgery date Monday-Friday 8am-4pm    Remember:  Do not eat after midnight the night before your surgery  You may drink clear liquids until 12:00 PM the afternoon of your surgery.   Clear liquids allowed are: Water, Non-Citrus Juices (without pulp), Carbonated Beverages, Clear Tea, Black Coffee Only, and Gatorade    Take these medicines the morning of surgery with A SIP OF WATER   Amlodipine (Norvasc)  Dexlansoprazole (Dexilant)  Hydrocodone-Acetaminophen (Norco)  Levothyroxine (Synthroid)  As of today, STOP taking any Aspirin (unless otherwise instructed by your surgeon) Aleve, Naproxen, Ibuprofen, Motrin, Advil, Goody's, BC's, all herbal medications, fish oil, and all vitamins.                      Do not wear jewelry, make up, or nail polish            Do not wear lotions, powders, perfumes, or deodorant.            Do not shave 48 hours prior to surgery.             Do not bring valuables to the hospital.            [facility] is not responsible for any belongings or valuables.  Do NOT Smoke (Tobacco/Vaping) or drink Alcohol 24 hours prior to your procedure If you use a CPAP at night, you may bring all equipment for your overnight stay.   Contacts, glasses, dentures or bridgework may not be worn into surgery.      For patients admitted to the hospital, discharge time will be determined by your treatment team.   Patients discharged the day of surgery will not be allowed to drive home, and someone needs  to stay with them for 24 hours.    Special instructions:   [place]- Preparing For Surgery  Before surgery, you can play an important role. Because skin is not sterile, your skin needs to be as free of germs as possible. You can reduce the number of germs on your skin by washing with CHG (chlorahexidine gluconate) Soap before surgery.  CHG is an antiseptic cleaner which kills germs and bonds with the skin to continue killing germs even after washing.    Oral Hygiene is also important to reduce your risk of infection.  Remember - BRUSH YOUR TEETH THE MORNING OF SURGERY WITH YOUR REGULAR TOOTHPASTE  Please do not use if you have an allergy to CHG or antibacterial soaps. If your skin becomes reddened/irritated stop using the CHG.  Do not shave (including legs and underarms) for at least 48 hours prior to first CHG shower. It is OK to shave your face.  Please follow these instructions carefully.   1. Shower the NIGHT BEFORE SURGERY and the MORNING OF SURGERY with CHG Soap.   2. If you chose to wash your hair, wash your hair first as usual with your normal shampoo.  3. After you shampoo, rinse your hair and body  thoroughly to remove the shampoo.  4. Use CHG as you would any other liquid soap. You can apply CHG directly to the skin and wash gently with a scrungie or a clean washcloth.   5. Apply the CHG Soap to your body ONLY FROM THE NECK DOWN.  Do not use on open wounds or open sores. Avoid contact with your eyes, ears, mouth and genitals (private parts). Wash Face and genitals (private parts)  with your normal soap.   6. Wash thoroughly, paying special attention to the area where your surgery will be performed.  7. Thoroughly rinse your body with warm water from the neck down.  8. DO NOT shower/wash with your normal soap after using and rinsing off the CHG Soap.  9. Pat yourself dry with a CLEAN TOWEL.  10. Wear CLEAN PAJAMAS to bed the night before surgery  11. Place CLEAN SHEETS  on your bed the night of your first shower and DO NOT SLEEP WITH PETS.   Day of Surgery: Wear Clean/Comfortable clothing the morning of surgery Do not apply any deodorants/lotions.   Remember to brush your teeth WITH YOUR REGULAR TOOTHPASTE.   Please read over the following fact sheets that you were given.

## 2020-10-26 NOTE — Progress Notes (Addendum)
PCP - Jilda Panda Cardiologist - denies  Chest x-ray - n/a EKG - 10-26-20   ERAS Protcol - yes, no drink ordered or given   COVID TEST- 10-26-20   Anesthesia review: yes Abnormal EKG BP 188/81 & Pulse 106 @ 1400 BP 209/79 & Pulse 88 @ 1430 BP 211/85& Pulse 98 @ 1452 Patient stated she took BP meds at 0600 on day of PAT appointment.   Gwendolyn [name] notified.  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 Patient called Dr. Mellody Drown while at PAT appointment in regards to BP.  Per patient's conversation, increase Amlodipine by 5mg .  Will recheck BP tomorrow morning (10-27-20) at Dr. Adela Ports office.  Patient to fax BP results after its checked.     Patient denies shortness of breath, fever, cough and chest pain at PAT appointment   All instructions explained to the patient, with a verbal understanding of the material. Patient agrees to go over the instructions while at home for a better understanding. Patient also instructed to self quarantine after being tested for COVID-19. The opportunity to ask questions was provided.

## 2020-10-27 LAB — SARS CORONAVIRUS 2 (TAT 6-24 HRS): SARS Coronavirus 2: NEGATIVE

## 2020-10-27 NOTE — Anesthesia Preprocedure Evaluation (Addendum)
Anesthesia Evaluation  Patient identified by MRN, date of birth, ID band Patient awake    Reviewed: Allergy & Precautions, NPO status , Patient's Chart, lab work & pertinent test results  Airway Mallampati: I  TM Distance: >3 FB Neck ROM: Full    Dental   Pulmonary asthma , Current Smoker,    Pulmonary exam normal        Cardiovascular hypertension, Pt. on medications Normal cardiovascular exam     Neuro/Psych Anxiety Depression    GI/Hepatic GERD  Medicated and Controlled,  Endo/Other    Renal/GU      Musculoskeletal   Abdominal   Peds  Hematology   Anesthesia Other Findings   Reproductive/Obstetrics                            Anesthesia Physical Anesthesia Plan  ASA: III  Anesthesia Plan: General   Post-op Pain Management:    Induction: Intravenous  PONV Risk Score and Plan: 2  Airway Management Planned: Oral ETT  Additional Equipment:   Intra-op Plan:   Post-operative Plan: Extubation in OR  Informed Consent: I have reviewed the patients History and Physical, chart, labs and discussed the procedure including the risks, benefits and alternatives for the proposed anesthesia with the patient or authorized representative who has indicated his/her understanding and acceptance.       Plan Discussed with: CRNA and Surgeon  Anesthesia Plan Comments: (See PAT note written by Myra Gianotti, PA-C. Recent medication adjustments by PCP for HTN. )      Anesthesia Quick Evaluation

## 2020-10-29 ENCOUNTER — Ambulatory Visit (HOSPITAL_COMMUNITY): Payer: PPO | Admitting: Certified Registered Nurse Anesthetist

## 2020-10-29 ENCOUNTER — Ambulatory Visit (HOSPITAL_COMMUNITY)
Admission: RE | Admit: 2020-10-29 | Discharge: 2020-10-29 | Disposition: A | Discharge status: Home / Self Care | Payer: PPO | Attending: Surgery | Admitting: Surgery

## 2020-10-29 ENCOUNTER — Other Ambulatory Visit: Payer: Self-pay

## 2020-10-29 ENCOUNTER — Encounter (HOSPITAL_COMMUNITY)
Admission: RE | Disposition: A | Discharge status: Home / Self Care | Payer: Self-pay | Source: Home / Self Care | Attending: Surgery

## 2020-10-29 ENCOUNTER — Encounter (HOSPITAL_COMMUNITY): Payer: Self-pay | Admitting: Surgery

## 2020-10-29 ENCOUNTER — Ambulatory Visit (HOSPITAL_COMMUNITY): Payer: PPO | Admitting: Vascular Surgery

## 2020-10-29 DIAGNOSIS — K429 Umbilical hernia without obstruction or gangrene: Secondary | ICD-10-CM | POA: Diagnosis not present

## 2020-10-29 DIAGNOSIS — K432 Incisional hernia without obstruction or gangrene: Secondary | ICD-10-CM | POA: Diagnosis not present

## 2020-10-29 DIAGNOSIS — F418 Other specified anxiety disorders: Secondary | ICD-10-CM | POA: Diagnosis not present

## 2020-10-29 DIAGNOSIS — F17210 Nicotine dependence, cigarettes, uncomplicated: Secondary | ICD-10-CM | POA: Insufficient documentation

## 2020-10-29 DIAGNOSIS — Z79890 Hormone replacement therapy (postmenopausal): Secondary | ICD-10-CM | POA: Diagnosis not present

## 2020-10-29 DIAGNOSIS — I10 Essential (primary) hypertension: Secondary | ICD-10-CM | POA: Diagnosis not present

## 2020-10-29 DIAGNOSIS — Z79899 Other long term (current) drug therapy: Secondary | ICD-10-CM | POA: Diagnosis not present

## 2020-10-29 DIAGNOSIS — J45909 Unspecified asthma, uncomplicated: Secondary | ICD-10-CM | POA: Diagnosis not present

## 2020-10-29 HISTORY — PX: UMBILICAL HERNIA REPAIR: SHX196

## 2020-10-29 SURGERY — REPAIR, HERNIA, UMBILICAL, ADULT
Anesthesia: General | Site: Abdomen

## 2020-10-29 MED ORDER — CHLORHEXIDINE GLUCONATE CLOTH 2 % EX PADS: 6.0000 | MEDICATED_PAD | Freq: Once | CUTANEOUS | Status: DC

## 2020-10-29 MED ORDER — CHLORHEXIDINE GLUCONATE 0.12 % MT SOLN
15.0000 mL | Freq: Once | OROMUCOSAL | Status: AC
  Administered 2020-10-29: 15 mL via OROMUCOSAL
  Filled 2020-10-29: qty 15

## 2020-10-29 MED ORDER — PHENYLEPHRINE HCL-NACL 10-0.9 MG/250ML-% IV SOLN
INTRAVENOUS | Status: DC | PRN
  Administered 2020-10-29: 50 ug/min via INTRAVENOUS

## 2020-10-29 MED ORDER — SUGAMMADEX SODIUM 200 MG/2ML IV SOLN
INTRAVENOUS | Status: DC | PRN
  Administered 2020-10-29: 200 mg via INTRAVENOUS

## 2020-10-29 MED ORDER — MIDAZOLAM HCL 2 MG/2ML IJ SOLN
INTRAMUSCULAR | Status: AC
  Filled 2020-10-29: qty 2

## 2020-10-29 MED ORDER — PHENYLEPHRINE HCL (PRESSORS) 10 MG/ML IV SOLN
INTRAVENOUS | Status: AC
  Filled 2020-10-29: qty 1

## 2020-10-29 MED ORDER — DEXAMETHASONE SODIUM PHOSPHATE 10 MG/ML IJ SOLN
INTRAMUSCULAR | Status: AC
  Filled 2020-10-29: qty 1

## 2020-10-29 MED ORDER — HYDROMORPHONE HCL 1 MG/ML IJ SOLN
INTRAMUSCULAR | Status: AC
  Administered 2020-10-29: 0.25 mg via INTRAVENOUS
  Filled 2020-10-29: qty 1

## 2020-10-29 MED ORDER — LIDOCAINE 2% (20 MG/ML) 5 ML SYRINGE
INTRAMUSCULAR | Status: AC
  Filled 2020-10-29: qty 5

## 2020-10-29 MED ORDER — PHENYLEPHRINE 40 MCG/ML (10ML) SYRINGE FOR IV PUSH (FOR BLOOD PRESSURE SUPPORT)
PREFILLED_SYRINGE | INTRAVENOUS | Status: AC
  Filled 2020-10-29: qty 10

## 2020-10-29 MED ORDER — ACETAMINOPHEN 500 MG PO TABS: 1000.0000 mg | ORAL_TABLET | Freq: Four times a day (QID) | ORAL | 0 refills | Status: AC | PRN

## 2020-10-29 MED ORDER — ONDANSETRON HCL 4 MG/2ML IJ SOLN
INTRAMUSCULAR | Status: DC | PRN
  Administered 2020-10-29: 4 mg via INTRAVENOUS

## 2020-10-29 MED ORDER — CHLORHEXIDINE GLUCONATE 0.12 % MT SOLN: 15.0000 mL | Freq: Once | OROMUCOSAL | Status: DC

## 2020-10-29 MED ORDER — ONDANSETRON HCL 4 MG/2ML IJ SOLN
INTRAMUSCULAR | Status: AC
  Filled 2020-10-29: qty 2

## 2020-10-29 MED ORDER — PROPOFOL 10 MG/ML IV BOLUS
INTRAVENOUS | Status: DC | PRN
  Administered 2020-10-29: 100 mg via INTRAVENOUS

## 2020-10-29 MED ORDER — MIDAZOLAM HCL 5 MG/5ML IJ SOLN
INTRAMUSCULAR | Status: DC | PRN
  Administered 2020-10-29: 2 mg via INTRAVENOUS

## 2020-10-29 MED ORDER — EPHEDRINE SULFATE 50 MG/ML IJ SOLN
INTRAMUSCULAR | Status: DC | PRN
  Administered 2020-10-29 (×2): 15 mg via INTRAVENOUS

## 2020-10-29 MED ORDER — ACETAMINOPHEN 500 MG PO TABS
1000.0000 mg | ORAL_TABLET | ORAL | Status: AC
  Administered 2020-10-29: 1000 mg via ORAL
  Filled 2020-10-29: qty 2

## 2020-10-29 MED ORDER — BUPIVACAINE-EPINEPHRINE 0.5% -1:200000 IJ SOLN
INTRAMUSCULAR | Status: DC | PRN
  Administered 2020-10-29: 30 mL

## 2020-10-29 MED ORDER — IBUPROFEN 800 MG PO TABS: 800.0000 mg | ORAL_TABLET | Freq: Three times a day (TID) | ORAL | 0 refills | Status: AC

## 2020-10-29 MED ORDER — CELECOXIB 200 MG PO CAPS
400.0000 mg | ORAL_CAPSULE | ORAL | Status: AC
  Administered 2020-10-29: 400 mg via ORAL
  Filled 2020-10-29: qty 2

## 2020-10-29 MED ORDER — PROPOFOL 10 MG/ML IV BOLUS
INTRAVENOUS | Status: AC
  Filled 2020-10-29: qty 20

## 2020-10-29 MED ORDER — OXYCODONE-ACETAMINOPHEN 5-325 MG PO TABS: 1.0000 | ORAL_TABLET | ORAL | 0 refills | Status: DC | PRN

## 2020-10-29 MED ORDER — ORAL CARE MOUTH RINSE: 15.0000 mL | Freq: Once | OROMUCOSAL | Status: AC

## 2020-10-29 MED ORDER — HYDROMORPHONE HCL 1 MG/ML IJ SOLN
0.2500 mg | INTRAMUSCULAR | Status: DC | PRN
  Administered 2020-10-29: 0.5 mg via INTRAVENOUS

## 2020-10-29 MED ORDER — ONDANSETRON HCL 4 MG/2ML IJ SOLN: 4.0000 mg | Freq: Once | INTRAMUSCULAR | Status: DC | PRN

## 2020-10-29 MED ORDER — CEFAZOLIN SODIUM-DEXTROSE 2-4 GM/100ML-% IV SOLN
2.0000 g | INTRAVENOUS | Status: AC
  Administered 2020-10-29: 2 g via INTRAVENOUS
  Filled 2020-10-29: qty 100

## 2020-10-29 MED ORDER — LACTATED RINGERS IV SOLN: INTRAVENOUS | Status: DC

## 2020-10-29 MED ORDER — ROCURONIUM BROMIDE 10 MG/ML (PF) SYRINGE
PREFILLED_SYRINGE | INTRAVENOUS | Status: DC | PRN
  Administered 2020-10-29: 70 mg via INTRAVENOUS

## 2020-10-29 MED ORDER — PHENYLEPHRINE 40 MCG/ML (10ML) SYRINGE FOR IV PUSH (FOR BLOOD PRESSURE SUPPORT)
PREFILLED_SYRINGE | INTRAVENOUS | Status: DC | PRN
  Administered 2020-10-29 (×2): 160 ug via INTRAVENOUS

## 2020-10-29 MED ORDER — 0.9 % SODIUM CHLORIDE (POUR BTL) OPTIME
TOPICAL | Status: DC | PRN
  Administered 2020-10-29: 1000 mL

## 2020-10-29 MED ORDER — FENTANYL CITRATE (PF) 250 MCG/5ML IJ SOLN
INTRAMUSCULAR | Status: AC
  Filled 2020-10-29: qty 5

## 2020-10-29 MED ORDER — DEXAMETHASONE SODIUM PHOSPHATE 10 MG/ML IJ SOLN
INTRAMUSCULAR | Status: DC | PRN
  Administered 2020-10-29: 5 mg via INTRAVENOUS

## 2020-10-29 MED ORDER — GABAPENTIN 300 MG PO CAPS
300.0000 mg | ORAL_CAPSULE | ORAL | Status: AC
  Administered 2020-10-29: 300 mg via ORAL
  Filled 2020-10-29: qty 1

## 2020-10-29 MED ORDER — MEPERIDINE HCL 25 MG/ML IJ SOLN
6.2500 mg | INTRAMUSCULAR | Status: DC | PRN
Start: 2020-10-29 — End: 2020-10-30

## 2020-10-29 MED ORDER — FENTANYL CITRATE (PF) 250 MCG/5ML IJ SOLN
INTRAMUSCULAR | Status: DC | PRN
  Administered 2020-10-29: 100 ug via INTRAVENOUS

## 2020-10-29 MED ORDER — LIDOCAINE 2% (20 MG/ML) 5 ML SYRINGE
INTRAMUSCULAR | Status: DC | PRN
  Administered 2020-10-29: 100 mg via INTRAVENOUS

## 2020-10-29 MED ORDER — ORAL CARE MOUTH RINSE: 15.0000 mL | Freq: Once | OROMUCOSAL | Status: DC

## 2020-10-29 SURGICAL SUPPLY — 37 items
ADH SKN CLS APL DERMABOND .7 (GAUZE/BANDAGES/DRESSINGS) ×1
APL PRP STRL LF DISP 70% ISPRP (MISCELLANEOUS) ×1
BLADE CLIPPER SURG (BLADE) IMPLANT
CANISTER SUCT 3000ML PPV (MISCELLANEOUS) IMPLANT
CHLORAPREP W/TINT 26 (MISCELLANEOUS) ×2 IMPLANT
COVER SURGICAL LIGHT HANDLE (MISCELLANEOUS) ×2 IMPLANT
COVER WAND RF STERILE (DRAPES) IMPLANT
DECANTER SPIKE VIAL GLASS SM (MISCELLANEOUS) ×2 IMPLANT
DERMABOND ADVANCED (GAUZE/BANDAGES/DRESSINGS) ×1
DERMABOND ADVANCED .7 DNX12 (GAUZE/BANDAGES/DRESSINGS) ×1 IMPLANT
DRAPE LAPAROTOMY 100X72 PEDS (DRAPES) ×2 IMPLANT
ELECT REM PT RETURN 9FT ADLT (ELECTROSURGICAL) ×2
ELECTRODE REM PT RTRN 9FT ADLT (ELECTROSURGICAL) ×1 IMPLANT
GLOVE SURG SIGNA 7.5 PF LTX (GLOVE) ×2 IMPLANT
GOWN STRL REUS W/ TWL LRG LVL3 (GOWN DISPOSABLE) ×1 IMPLANT
GOWN STRL REUS W/ TWL XL LVL3 (GOWN DISPOSABLE) ×1 IMPLANT
GOWN STRL REUS W/TWL LRG LVL3 (GOWN DISPOSABLE) ×2
GOWN STRL REUS W/TWL XL LVL3 (GOWN DISPOSABLE) ×2
KIT BASIN OR (CUSTOM PROCEDURE TRAY) ×2 IMPLANT
KIT TURNOVER KIT B (KITS) ×2 IMPLANT
MESH BARD SOFT 6X6IN (Mesh General) ×1 IMPLANT
NDL HYPO 25GX1X1/2 BEV (NEEDLE) ×1 IMPLANT
NEEDLE HYPO 25GX1X1/2 BEV (NEEDLE) ×2 IMPLANT
NS IRRIG 1000ML POUR BTL (IV SOLUTION) ×2 IMPLANT
PACK GENERAL/GYN (CUSTOM PROCEDURE TRAY) ×2 IMPLANT
PAD ARMBOARD 7.5X6 YLW CONV (MISCELLANEOUS) ×2 IMPLANT
PENCIL SMOKE EVACUATOR (MISCELLANEOUS) ×2 IMPLANT
SUT MNCRL AB 4-0 PS2 18 (SUTURE) ×2 IMPLANT
SUT NOVA 0 T19/GS 22DT (SUTURE) ×1 IMPLANT
SUT NOVA NAB DX-16 0-1 5-0 T12 (SUTURE) ×2 IMPLANT
SUT PDS AB 2-0 CT2 27 (SUTURE) ×2 IMPLANT
SUT VIC AB 2-0 SH 18 (SUTURE) ×1 IMPLANT
SUT VIC AB 3-0 SH 27 (SUTURE) ×2
SUT VIC AB 3-0 SH 27X BRD (SUTURE) ×1 IMPLANT
SYR CONTROL 10ML LL (SYRINGE) ×2 IMPLANT
TOWEL GREEN STERILE (TOWEL DISPOSABLE) ×2 IMPLANT
TOWEL GREEN STERILE FF (TOWEL DISPOSABLE) ×2 IMPLANT

## 2020-10-29 NOTE — Discharge Instructions (Signed)
VENTRAL HERNIA REPAIR POST OPERATIVE INSTRUCTIONS  Thinking Clearly  . The anesthesia may cause you to feel different for 1 or 2 days. Do not drive, drink alcohol, or make any big decisions for at least 2 days.  Nutrition . When you wake up, you will be able to drink small amounts of liquid. If you do not feel sick, you can slowly advance your diet to regular foods. . Continue to drink lots of fluids, usually about 8 to 10 glasses per day. . Eat a high-fiber diet so you don't strain during bowel movements. . High-Fiber Foods o Foods high in fiber include beans, bran cereals and whole-grain breads, peas, dried fruit (figs, apricots, and dates), raspberries, blackberries, strawberries, sweet corn, broccoli, baked potatoes with skin, plums, pears, apples, greens, and nuts. Activity . Slowly increase your activity. Be sure to get up and walk every hour or so to prevent blood clots. . No heavy lifting or strenuous activity for 4 weeks following surgery to prevent hernias at your incision sites or recurrence of your hernia. . It is normal to feel tired. You may need more sleep than usual.  Get your rest but make sure to get up and move around frequently to prevent blood clots and pneumonia.  Work and Return to [employer] . You can go back to work when you feel well enough. Discuss the timing with your surgeon. . You can usually go back to school or work 1 week or less after an laparoscopic or an open repair. . If your work requires heavy lifting or strenuous activity you need to be placed on light duty for 4 weeks following surgery. . You can return to gym class, sports or other physical activities 4 weeks after surgery.  Wound Care . You may experience significant bruising throughout the abdominal wall that may track down into the groin including into the scrotum in males.  Rest, elevating the groin and scrotum above the level of the heart, ice and compression with tight fitting underwear or an  abdominal binder can help.  . Always wash your hands before and after touching near your incision site. . Do not soak in a bathtub until cleared at your follow up appointment. You may take a shower 24 hours after surgery. . A small amount of drainage from the incision is normal. If the drainage is thick and yellow or the site is red, you may have an infection, so call your surgeon. . If you have a drain in one of your incisions, it will be taken out in office when the drainage stops. . Steri-Strips will fall off in 7 to 10 days or they will be removed during your first office visit. . If you have dermabond glue covering over the incision, allow the glue to flake off on its own. . Protect the new skin, especially from the sun. The sun can burn and cause darker scarring. . Your scar will heal in about 4 to 6 weeks and will become softer and continue to fade over the next year.  The cosmetic appearance of the incisions will improve over the course of the first year after surgery. . Sensation around your incision will return in a few weeks or months.  Bowel Movements . After intestinal surgery, you may have loose watery stools for several days. If watery diarrhea lasts longer than 3 days, contact your surgeon. . Pain medication (narcotics) can cause constipation. Increase the fiber in your diet with high-fiber foods if you are  constipated. You can take an over the counter stool softener like Colace to avoid constipation.  Additional over the counter medications can also be used if Colace isn't sufficient (for example, Milk of Magnesia or Miralax).  Pain . The amount of pain is different for each person. Some people need only 1 to 3 doses of pain control medication, while others need more. . Take alternating doses of tylenol and ibuprofen around the clock for the first five days following surgery.  This will provide a baseline of pain control and help with inflammation.  Take the narcotic pain medication  in addition if needed for severe pain.  Contact Your Surgeon at [phone], if you have: . Pain that will not go away . Pain that gets worse . A fever of more than 101F (38.3C) . Repeated vomiting . Swelling, redness, bleeding, or bad-smelling drainage from your wound site . Strong abdominal pain . No bowel movement or unable to pass gas for 3 days . Watery diarrhea lasting longer than 3 days  Pain Control . The goal of pain control is to minimize pain, keep you moving and help you heal. Your surgical team will work with you on your pain plan. Most often a combination of therapies and medications are used to control your pain. You may also be given medication (local anesthetic) at the surgical site. This may help control your pain for several days. . Extreme pain puts extra stress on your body at a time when your body needs to focus on healing. Do not wait until your pain has reached a level "10" or is unbearable before telling your doctor or nurse. It is much easier to control pain before it becomes severe. . Following a laparoscopic procedure, pain is sometimes felt in the shoulder. This is due to the gas inserted into your abdomen during the procedure. Moving and walking helps to decrease the gas and the right shoulder pain.  . Use the guide below for ways to manage your post-operative pain. Learn more by going to facs.org/safepaincontrol.  How Intense Is My Pain Common Therapies to Feel Better       I hardly notice my pain, and it does not interfere with my activities.  I notice my pain and it distracts me, but I can still do activities (sitting up, walking, standing).  Non-Medication Therapies  Ice (in a bag, applied over clothing at the surgical site), elevation, rest, meditation, massage, distraction (music, TV, play) walking and mild exercise Splinting the abdomen with pillows +  Non-Opioid Medications Acetaminophen (Tylenol) Non-steroidal anti-inflammatory drugs  (NSAIDS) Aspirin, Ibuprofen (Motrin, Advil) Naproxen (Aleve) Take these as needed, when you feel pain. Both acetaminophen and NSAIDs help to decrease pain and swelling (inflammation).      My pain is hard to ignore and is more noticeable even when I rest.  My pain interferes with my usual activities.  Non-Medication Therapies  +  Non-Opioid medications  Take on a regular schedule (around-the-clock) instead of as needed. (For example, Tylenol every 6 hours at 9:00 am, 3:00 pm, 9:00 pm, 3:00 am and Motrin every 6 hours at 12:00 am, 6:00 am, 12:00 pm, 6:00 pm)         I am focused on my pain, and I am not doing my daily activities.  I am groaning in pain, and I cannot sleep. I am unable to do anything.  My pain is as bad as it could be, and nothing else matters.  Non-Medication Therapies  +  Around-the-Clock Non-Opioid Medications  +  Short-acting opioids  Opioids should be used with other medications to manage severe pain. Opioids block pain and give a feeling of euphoria (feel high). Addiction, a serious side effect of opioids, is rare with short-term (a few days) use.  Examples of short-acting opioids include: Tramadol (Ultram), Hydrocodone (Norco, Vicodin), Hydromorphone (Dilaudid), Oxycodone (Oxycontin)     The above directions have been adapted from the [employer] Surgical Patient Education Program.  Please refer to the ACS website if needed: http://kaiser.net/   Ivar Drape, MD [facility] [place] [address], [address], [place], [place]  [address] ?  [address] [address], [place], [place]   [address] [phone] ? [phone] ? FAX [phone] Web site: [email or web address]

## 2020-10-29 NOTE — Op Note (Signed)
Patient: Gwendolyn [name] (09/16/1956, [id number])  Date of Surgery: 10/29/2020   Preoperative Diagnosis: Periumbilical incisional hernia  Postoperative Diagnosis:  Periumbilical incisional hernia  Surgical Procedure:  Incisional hernia repair with mesh Conley Simmonds Stoppa) Bilateral posterior rectus sheath myofascial advancement flaps  Operative Team Members:   Rohail Klees, Nickola Major, MD - Primary   Anesthesiologist: Lillia Abed, MD CRNA: Alain Marion, CRNA; Imagene Riches, CRNA   Anesthesia: General   Fluids:  Total I/O In: 800 [I.V.:800] Out: 5 [id number]  Complications: None  Drains:  none   Specimen: none  Disposition:  PACU - hemodynamically stable.  Plan of Care: Discharge to home after PACU   Indications for Procedure: Gwendolyn [name] is a 64 y.o. female who presented with a periumbilical incisional hernia from a previous lower midline open hysterectomy incision.  I recommended incisional hernia repair with mesh.  The procedure itself as well as its risks, benefits, and alternatives were discussed with the patient.  After complete discussion all questions answered the patient granted consent to proceed.  Findings:  Hernia Location: umbilical  Hernia Size:  3cm tall  x 4 cm wide   Mesh Size &Type:  10cm x 10 cm Bard Soft Mesh (Mid-weight, wide-pore polypropylene) Mesh Position: Retromuscular Myofascial Releases: Bilateral rectus myofascial release (Rives-Stoppa)  Description of Procedure:   On the date stated above patient taken the operating room suite placed supine position.  General endotracheal anesthesia was induced.  SCDs were placed prior to the case start, antibiotics were given prior to the case start.  Patient's abdomen was prepped and draped in usual sterile fashion.  We completed a timeout verifying the correct patient, position, procedure, and equipment needed for the case.  I began by making elliptical incision vertically on the abdominal wall  excising the attenuated skin overlying the hernia as well as some of her previous scar.  I dissected down through the subcutaneous tissues to identify the base of the hernia defect.  The umbilical stalk was divided using electrocautery releasing the umbilicus from the abdominal wall.  I worked to define the hernia defect.  There was a small hernia defect at the base the umbilicus and a additional defect just inferiorly to this likely at the superiormost aspect of the previous fascial incision from her previous hysterectomy.  The hernia was reduced by was unable to avoid opening the peritoneum.  The preperitoneal space was obliterated from her previous scar in this area so I decided to proceed with a reduced open retromuscular repair of this hernia defect.  A rectus myofascial release (Rives-Stoppa) was performed on the LEFT side. The posterior rectus sheath was incised just lateral to the hernia edge.  This incision in the posterior rectus sheath was extended both cephalad and caudal to the hernia defect.  Dissection was carried out laterally in the retromuscular plane to the edge of the rectus sheath peeling away the posterior rectus sheath from the underside of the rectus muscle. The segmental innervation to the rectus muscle was preserved.  The rectus myofascial release accomplished medialization of the posterior rectus sheath towards the midline and release of the rectus muscle from its encasement in the rectus sheath, allowing for widening of the rectus muscle and advancement towards the midline.  A rectus myofascial release (Rives-Stoppa) was performed on the RIGHT side. The posterior rectus sheath was incised just lateral to the hernia edge.  This incision in the posterior rectus sheath was extended both cephalad and caudal to the hernia defect.  Dissection  was carried out laterally in the retromuscular plane to the edge of the rectus sheath peeling away the posterior rectus sheath from the underside of  the rectus muscle. The segmental innervation to the rectus muscle was preserved.  The rectus myofascial release accomplished medialization of the posterior rectus sheath towards the midline and release of the rectus muscle from its encasement in the rectus sheath, allowing for widening of the rectus muscle and advancement towards the midline.   The posterior fascia was reapproximated in the midline with a continuous 2-0 PDS suture.   With the posterior fascia closed, I measured the retromuscular space for the mesh.  A 10 cm x 10 cm piece of Bard soft mesh was placed in the retromuscular space.  This completely filled the space and did not require fixation.  The mesh was irrigated with saline irrigation and then we direct our attention to closing the midline.  The hernia defect was measured to be 4 cm wide by 3 cm tall.  The anterior fascia was closed in the midline using interrupted figure-of-eight 0 Novafil suture.  A Vicryl suture was used to tag the umbilical stalk back down to the anterior abdominal fascia and the subcutaneous tissues were reapproximated using Vicryl suture.  The skin was closed using 4-0 Monocryl and Dermabond was applied.  All sponge and needle counts were correct at the end this case.  [name], MD General, Bariatric, & Minimally Invasive Surgery [facility], [place]

## 2020-10-29 NOTE — Anesthesia Procedure Notes (Signed)
Procedure Name: Intubation Date/Time: 10/29/2020 4:00 PM Performed by: Imagene Riches, CRNA Pre-anesthesia Checklist: Patient identified, Emergency Drugs available, Suction available and Patient being monitored Patient Re-evaluated:Patient Re-evaluated prior to induction Oxygen Delivery Method: Circle System Utilized Preoxygenation: Pre-oxygenation with 100% oxygen Induction Type: IV induction Ventilation: Mask ventilation without difficulty and Oral airway inserted - appropriate to patient size Laryngoscope Size: Sabra Heck and 2 Grade View: Grade I Tube type: Oral Tube size: 7.0 mm Number of attempts: 1 Airway Equipment and Method: Stylet and Oral airway Placement Confirmation: ETT inserted through vocal cords under direct vision,  positive ETCO2 and breath sounds checked- equal and bilateral Secured at: 21 cm Tube secured with: Tape Dental Injury: Teeth and Oropharynx as per pre-operative assessment

## 2020-10-29 NOTE — Transfer of Care (Signed)
Immediate Anesthesia Transfer of Care Note  Patient: Gwendolyn [name]  Procedure(s) Performed: OPEN UMBILICAL HERNIA REPAIR WITH MESH (N/A Abdomen)  Patient Location: PACU  Anesthesia Type:General  Level of Consciousness: awake, alert  and oriented  Airway & Oxygen Therapy: Patient Spontanous Breathing and Patient connected to face mask oxygen  Post-op Assessment: Report given to RN and Post -op Vital signs reviewed and stable  Post vital signs: Reviewed and stable  Last Vitals:  Vitals Value Taken Time  BP 168/75 10/29/20 1736  Temp    Pulse 69 10/29/20 1739  Resp 21 10/29/20 1739  SpO2 96 % 10/29/20 1739  Vitals shown include unvalidated device data.  Last Pain:  Vitals:   10/29/20 1316  TempSrc: Oral  PainSc: 0-No pain      Patients Stated Pain Goal: 5 (93/81/82 9937)  Complications: No complications documented.

## 2020-10-29 NOTE — H&P (Signed)
Admitting Physician: Nickola Major Kenley Troop  Service: General surgery  CC: Umbilical hernia  Subjective   HPI: Gwendolyn [name] is an 64 y.o. female who is here for periumbilical incisional hernia repair  Past Medical History:  Diagnosis Date  . Anemia   . Anxiety   . Asthma   . COPD (chronic obstructive pulmonary disease) ([facility])   . Depression   . GERD (gastroesophageal reflux disease)   . History of kidney stones   . Hypertension   . Hypothyroidism     Past Surgical History:  Procedure Laterality Date  . ABDOMINAL HYSTERECTOMY    . Cesarean Section     (2)  . CHOLECYSTECTOMY    . COLONOSCOPY  2020  . Gastric Ulcer  2020    No family history on file.  Social:  reports that she has been smoking cigarettes. She has been smoking about 1.00 pack per day. She has never used smokeless tobacco. She reports that she does not drink alcohol and does not use drugs.  Allergies:  Allergies  Allergen Reactions  . Biaxin [Clarithromycin] Other (See Comments)    Vertigo  . Floxacillin [Flucloxacillin] Other (See Comments)    Vertigo  . Morphine And Related Other (See Comments)    Headache    Medications: Current Outpatient Medications  Medication Instructions  . Acetaminophen-Caffeine (EXCEDRIN ASPIRIN FREE PO) 1 tablet, Oral, Daily PRN  . amLODipine (NORVASC) 10 mg, Oral, Daily  . Azilsartan-Chlorthalidone 40-25 MG TABS 1 tablet, Oral, Daily  . Dexilant 30 mg, Oral, Daily PRN  . escitalopram (LEXAPRO) 10 mg, Oral, Daily at bedtime  . HYDROcodone-acetaminophen (NORCO/VICODIN) 5-325 MG tablet 0.25-0.5 tablets, Oral, Every 6 hours PRN  . ibuprofen (ADVIL) 200 mg, Oral, At bedtime PRN  . levothyroxine (SYNTHROID) 137 mcg, Oral, Daily before breakfast    ROS - all of the below systems have been reviewed with the patient and positives are indicated with bold text General: chills, fever or night sweats Eyes: blurry vision or double vision ENT: epistaxis or sore  throat Allergy/Immunology: itchy/watery eyes or nasal congestion Hematologic/Lymphatic: bleeding problems, blood clots or swollen lymph nodes Endocrine: temperature intolerance or unexpected weight changes Breast: new or changing breast lumps or nipple discharge Resp: cough, shortness of breath, or wheezing CV: chest pain or dyspnea on exertion GI: as per HPI GU: dysuria, trouble voiding, or hematuria MSK: joint pain or joint stiffness Neuro: TIA or stroke symptoms Derm: pruritus and skin lesion changes Psych: anxiety and depression  Objective   PE There were no vitals taken for this visit. Constitutional: NAD; conversant; no deformities Eyes: Moist conjunctiva; no lid lag; anicteric; PERRL Neck: Trachea midline; no thyromegaly Lungs: Normal respiratory effort; no tactile fremitus CV: RRR; no palpable thrills; no pitting edema GI: Periumbilical incisional hernia present; no palpable hepatosplenomegaly MSK: Normal range of motion of extremities; no clubbing/cyanosis Psychiatric: Appropriate affect; alert and oriented x3 Lymphatic: No palpable cervical or axillary lymphadenopathy  No results found for this or any previous visit (from the past 24 hour(s)).  Imaging Orders  No imaging studies ordered today     Assessment and Plan   Gwendolyn [name] is an 64 y.o. female with periumbilical incisional hernia.  I recommended open umbilical hernia repair with mesh.  The procedure itself as well as its risk, benefits alternatives of been discussed with the patient.  Patient granted consent to proceed.  We will proceed to the operating room as scheduled.  [name], M.D. General, Bariatric and Minimally Invasive Surgery  [facility] Use [email or web address] to contact on call provider

## 2020-10-30 NOTE — Anesthesia Postprocedure Evaluation (Signed)
Anesthesia Post Note  Patient: Gwendolyn [name]  Procedure(s) Performed: OPEN UMBILICAL HERNIA REPAIR WITH MESH (N/A Abdomen)     Patient location during evaluation: PACU Anesthesia Type: General Level of consciousness: awake and alert Pain management: pain level controlled Vital Signs Assessment: post-procedure vital signs reviewed and stable Respiratory status: spontaneous breathing, nonlabored ventilation, respiratory function stable and patient connected to nasal cannula oxygen Cardiovascular status: blood pressure returned to baseline and stable Postop Assessment: no apparent nausea or vomiting Anesthetic complications: no   No complications documented.  Last Vitals:  Vitals:   10/29/20 1837 10/29/20 1855  BP: (!) 127/53 (!) 130/56  Pulse: 69 63  Resp: 17 14  Temp: 36.7 C 36.7 C  SpO2: 94% 91%    Last Pain:  Vitals:   10/29/20 1837  TempSrc:   PainSc: 5                  [name]

## 2020-11-01 ENCOUNTER — Encounter (HOSPITAL_COMMUNITY): Payer: Self-pay | Admitting: Surgery

## 2021-06-09 ENCOUNTER — Other Ambulatory Visit: Payer: Self-pay

## 2021-06-09 ENCOUNTER — Ambulatory Visit (INDEPENDENT_AMBULATORY_CARE_PROVIDER_SITE_OTHER): Payer: Medicare Other | Admitting: Otolaryngology

## 2021-06-09 DIAGNOSIS — H906 Mixed conductive and sensorineural hearing loss, bilateral: Secondary | ICD-10-CM | POA: Diagnosis not present

## 2021-06-09 DIAGNOSIS — H6503 Acute serous otitis media, bilateral: Secondary | ICD-10-CM | POA: Diagnosis not present

## 2021-06-09 MED ORDER — TRIAMCINOLONE ACETONIDE 55 MCG/ACT NA AERO: 2.0000 | INHALATION_SPRAY | Freq: Every day | NASAL | 12 refills | Status: DC

## 2021-06-09 MED ORDER — AMOXICILLIN-POT CLAVULANATE 875-125 MG PO TABS: 1.0000 | ORAL_TABLET | Freq: Two times a day (BID) | ORAL | 0 refills | Status: DC

## 2021-06-09 NOTE — Progress Notes (Signed)
HPI: Gwendolyn [name] is a 65 y.o. 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  She is always had intermittent problems with her right ear.  She apparently had a previous perforation of the right ear and had a tympanoplasty performed on the right side over 30 years ago.  She is able to to hear little bit better when her ears "pop".  She initially has some wax buildup in her ears and had her ears irrigated.  Past Medical History:  Diagnosis Date   Anemia    Anxiety    Asthma    COPD (chronic obstructive pulmonary disease) ([facility])    Depression    GERD (gastroesophageal reflux disease)    History of kidney stones    Hypertension    Hypothyroidism    Past Surgical History:  Procedure Laterality Date   ABDOMINAL HYSTERECTOMY     Cesarean Section     (2)   CHOLECYSTECTOMY     COLONOSCOPY  2020   Gastric Ulcer  [id number]   UMBILICAL HERNIA REPAIR N/A 10/29/2020   Procedure: OPEN UMBILICAL HERNIA REPAIR WITH MESH;  Surgeon: Stechschulte, Nickola Major, MD;  Location: [facility] OR;  Service: General;  Laterality: N/A;   Social History   Socioeconomic History   Marital status: Widowed    Spouse name: Not on file   Number of children: Not on file   Years of education: Not on file   Highest education level: Not on file  Occupational History   Not on file  Tobacco Use   Smoking status: Every Day    Packs/day: 1.00    Types: Cigarettes   Smokeless tobacco: Never  Vaping Use   Vaping Use: Never used  Substance and Sexual Activity   Alcohol use: Never   Drug use: Never   Sexual activity: Not on file    Comment: hysterectomy  Other Topics Concern   Not on file  Social History Narrative   Not on file   Social Determinants of Health   Financial Resource Strain: Not on file  Food Insecurity: Not on file  Transportation Needs: Not on file  Physical Activity: Not on file  Stress: Not on file  Social  Connections: Not on file   No family history on file. Allergies  Allergen Reactions   Biaxin [Clarithromycin] Other (See Comments)    Vertigo   Floxacillin [Flucloxacillin] Other (See Comments)    Vertigo   Morphine And Related Other (See Comments)    Headache   Prior to Admission medications   Medication Sig Start Date End Date Taking? Authorizing Provider  acetaminophen (TYLENOL) 500 MG tablet Take 2 tablets (1,000 mg total) by mouth every 6 (six) hours as needed. 10/29/20 10/29/21  Stechschulte, Nickola Major, MD  amLODipine (NORVASC) 10 MG tablet Take 10 mg by mouth daily. 10/26/20   [provider]  Azilsartan-Chlorthalidone 40-25 MG TABS Take 1 tablet by mouth daily. 10/27/20   [provider]  Dexlansoprazole (DEXILANT) 30 MG capsule Take 30 mg by mouth daily as needed (reflux).    [provider]  escitalopram (LEXAPRO) 10 MG tablet Take 10 mg by mouth at bedtime.    [provider]  levothyroxine (SYNTHROID) 137 MCG tablet Take 137 mcg by mouth daily before breakfast.    [provider]  nebivolol (BYSTOLIC) 10 MG tablet Take 10 mg by mouth daily.    [provider]  oxyCODONE-acetaminophen (PERCOCET) 5-325 MG tablet Take 1 tablet by mouth every 4 (four) hours as needed for severe pain. 10/29/20 10/29/21  Stechschulte, Nickola Major, MD     Positive ROS: Otherwise negative  All other systems have been reviewed and were otherwise negative with the exception of those mentioned in the HPI and as above.  Physical Exam: Constitutional: Alert, well-appearing, no acute distress Ears: External ears without lesions or tenderness. Ear canals are clear bilaterally with no significant wax buildup.  The right TM is very thickened and retracted.  The left TM is not as retracted but has a serous otitis.  On tuning fork testing BC was greater than AC on the right side and AC was slightly better than BC on the left side.  I was able to insufflate some  air behind both TMs in the office today with improved hearing. Nasal: External nose without lesions. Septum with mild deformity and moderate rhinitis.  No obvious mucopurulent discharge noted from the middle meatus.  No polyps..  Oral: Lips and gums without lesions. Tongue and palate mucosa without lesions. Posterior oropharynx clear. Neck: No palpable adenopathy or masses Respiratory: Breathing comfortably  Skin: No facial/neck lesions or rash noted.  Procedures  Assessment: Bilateral serous otitis media worse on the right side.  Plan: Placed her on Nasacort 2 sprays each nostril at night along with Augmentin 875 mg twice daily for 10 days. She will follow-up in 3 weeks for recheck and hearing test.   Radene Journey, MD   CC:

## 2021-07-01 ENCOUNTER — Ambulatory Visit (INDEPENDENT_AMBULATORY_CARE_PROVIDER_SITE_OTHER): Payer: Medicare Other | Admitting: Otolaryngology

## 2021-07-01 ENCOUNTER — Other Ambulatory Visit: Payer: Self-pay

## 2021-07-01 DIAGNOSIS — H6523 Chronic serous otitis media, bilateral: Secondary | ICD-10-CM | POA: Diagnosis not present

## 2021-07-01 DIAGNOSIS — H906 Mixed conductive and sensorineural hearing loss, bilateral: Secondary | ICD-10-CM | POA: Diagnosis not present

## 2021-07-01 DIAGNOSIS — H6983 Other specified disorders of Eustachian tube, bilateral: Secondary | ICD-10-CM

## 2021-07-01 MED ORDER — CIPROFLOXACIN-DEXAMETHASONE 0.3-0.1 % OT SUSP: 4.0000 [drp] | Freq: Two times a day (BID) | OTIC | 0 refills | Status: DC

## 2021-07-01 NOTE — Progress Notes (Addendum)
HPI: Gwendolyn [name] is a 65 y.o. female who returns today for evaluation of hearing.  She has always had more trouble hearing from her right ear compared to the left.  Especially since she had a bad ear infection with a ruptured right TM.  She states that she is able to pop her left ear but is unable to pop her right ear.  She has been using nasal steroid sprays recently on a regular basis. Audiologic testing today demonstrated type B tympanograms bilaterally with conductive hearing loss in both ears but low bit more pronounced on the right side.  Also the bone scores are worse on the right ear compared to the left SRT's were 60 DB on the right and 25 DB on the left..  Past Medical History:  Diagnosis Date   Anemia    Anxiety    Asthma    COPD (chronic obstructive pulmonary disease) (HCC)    Depression    GERD (gastroesophageal reflux disease)    History of kidney stones    Hypertension    Hypothyroidism    Past Surgical History:  Procedure Laterality Date   ABDOMINAL HYSTERECTOMY     Cesarean Section     (2)   CHOLECYSTECTOMY     COLONOSCOPY  2020   Gastric Ulcer  [id number]   UMBILICAL HERNIA REPAIR N/A 10/29/2020   Procedure: OPEN UMBILICAL HERNIA REPAIR WITH MESH;  Surgeon: Stechschulte, Nickola Major, MD;  Location: [facility] OR;  Service: General;  Laterality: N/A;   Social History   Socioeconomic History   Marital status: Widowed    Spouse name: Not on file   Number of children: Not on file   Years of education: Not on file   Highest education level: Not on file  Occupational History   Not on file  Tobacco Use   Smoking status: Every Day    Packs/day: 1.00    Types: Cigarettes   Smokeless tobacco: Never  Vaping Use   Vaping Use: Never used  Substance and Sexual Activity   Alcohol use: Never   Drug use: Never   Sexual activity: Not on file    Comment: hysterectomy  Other Topics Concern   Not on file  Social History Narrative   Not on file   Social Determinants of Health    Financial Resource Strain: Not on file  Food Insecurity: Not on file  Transportation Needs: Not on file  Physical Activity: Not on file  Stress: Not on file  Social Connections: Not on file   No family history on file. Allergies  Allergen Reactions   Biaxin [Clarithromycin] Other (See Comments)    Vertigo   Floxacillin [Flucloxacillin] Other (See Comments)    Vertigo   Morphine And Related Other (See Comments)    Headache   Prior to Admission medications   Medication Sig Start Date End Date Taking? Authorizing Provider  acetaminophen (TYLENOL) 500 MG tablet Take 2 tablets (1,000 mg total) by mouth every 6 (six) hours as needed. 10/29/20 10/29/21  Stechschulte, Nickola Major, MD  amLODipine (NORVASC) 10 MG tablet Take 10 mg by mouth daily. 10/26/20   [provider]  amoxicillin-clavulanate (AUGMENTIN) 875-125 MG tablet Take 1 tablet by mouth 2 (two) times daily. 06/09/21   Rozetta Nunnery, MD  Azilsartan-Chlorthalidone 40-25 MG TABS Take 1 tablet by mouth daily. 10/27/20   [provider]  Dexlansoprazole (DEXILANT) 30 MG capsule Take 30 mg by mouth daily as needed (reflux).    [provider]  escitalopram (LEXAPRO) 10 MG tablet Take 10 mg by mouth at bedtime.    [provider]  levothyroxine (SYNTHROID) 137 MCG tablet Take 137 mcg by mouth daily before breakfast.    [provider]  nebivolol (BYSTOLIC) 10 MG tablet Take 10 mg by mouth daily.    [provider]  oxyCODONE-acetaminophen (PERCOCET) 5-325 MG tablet Take 1 tablet by mouth every 4 (four) hours as needed for severe pain. 10/29/20 10/29/21  Stechschulte, Nickola Major, MD  triamcinolone (NASACORT) 55 MCG/ACT AERO nasal inhaler Place 2 sprays into the nose daily. 2 sprays each nostril at night 06/09/21   Rozetta Nunnery, MD     Positive ROS: Otherwise negative  All other systems have been reviewed and were otherwise negative with the exception of those mentioned in the  HPI and as above.  Physical Exam: Constitutional: Alert, well-appearing, no acute distress Ears: External ears without lesions or tenderness. Ear canals are clear bilaterally.  Both TMs are retracted with the right TM be a little thicker. Nasal: External nose without lesions. Septum with mild deformity and moderate rhinitis.  On nasopharyngoscopy was performed today to evaluate the nasopharynx and eustachian tube region.  Eustachian tube regions were unobstructed bilaterally although she did have mild rhinitis.  No discharge from eustachian tube and no nasopharyngeal abnormalities noted.. Clear nasal passages Oral: Lips and gums without lesions. Tongue and palate mucosa without lesions. Posterior oropharynx clear. Neck: No palpable adenopathy or masses Respiratory: Breathing comfortably  Skin: No facial/neck lesions or rash noted.  Procedures  Assessment: Bilateral otitis media with effusions with larger conductive loss in the right ear. Asymmetric sensorineural hearing loss worse in the right ear.  Plan: Recommended continue use of nasal steroid spray and continue to try to pop her ears on a regular basis. I think she would benefit from having a T-tube placed in the right ear as this is her worst hearing ear and she has difficulty equalizing pressure in the right ear. She will follow-up in the office next week to have a T-tube placed on the right side. Sent a prescription for Ciprodex into her pharmacy today.   Radene Journey, MD

## 2021-07-05 ENCOUNTER — Ambulatory Visit (INDEPENDENT_AMBULATORY_CARE_PROVIDER_SITE_OTHER): Payer: Medicare Other | Admitting: Otolaryngology

## 2021-07-05 ENCOUNTER — Other Ambulatory Visit: Payer: Self-pay

## 2021-07-05 DIAGNOSIS — H9072 Mixed conductive and sensorineural hearing loss, unilateral, left ear, with unrestricted hearing on the contralateral side: Secondary | ICD-10-CM

## 2021-07-05 NOTE — Progress Notes (Signed)
HPI: Gwendolyn [name] is a 65 y.o. female who returns today for evaluation of right ear hearing loss.  She has had previous surgery on the right ear a number of years ago and has always had poor hearing in the right ear.  She used to be able to "pop" her ear with slightly improved hearing but over the past several months she has been unable to "pop" the ear.  On previous exam she has a retracted right TM which is very thickened with tympanosclerosis.  She is previously been using nasal steroid spray without much benefit. Audiologic testing demonstrated a 20-35 DB conductive loss in the right ear with a minimal conductive loss in the left ear in addition to some underlying sensorineural hearing loss in the right ear more pronounced in the left ear. She presented today for possible placement of myringotomy tube.  Past Medical History:  Diagnosis Date   Anemia    Anxiety    Asthma    COPD (chronic obstructive pulmonary disease) (HCC)    Depression    GERD (gastroesophageal reflux disease)    History of kidney stones    Hypertension    Hypothyroidism    Past Surgical History:  Procedure Laterality Date   ABDOMINAL HYSTERECTOMY     Cesarean Section     (2)   CHOLECYSTECTOMY     COLONOSCOPY  2020   Gastric Ulcer  [id number]   UMBILICAL HERNIA REPAIR N/A 10/29/2020   Procedure: OPEN UMBILICAL HERNIA REPAIR WITH MESH;  Surgeon: Stechschulte, Nickola Major, MD;  Location: [facility] OR;  Service: General;  Laterality: N/A;   Social History   Socioeconomic History   Marital status: Widowed    Spouse name: Not on file   Number of children: Not on file   Years of education: Not on file   Highest education level: Not on file  Occupational History   Not on file  Tobacco Use   Smoking status: Every Day    Packs/day: 1.00    Types: Cigarettes   Smokeless tobacco: Never  Vaping Use   Vaping Use: Never used  Substance and Sexual Activity   Alcohol use: Never   Drug use: Never   Sexual activity: Not on  file    Comment: hysterectomy  Other Topics Concern   Not on file  Social History Narrative   Not on file   Social Determinants of Health   Financial Resource Strain: Not on file  Food Insecurity: Not on file  Transportation Needs: Not on file  Physical Activity: Not on file  Stress: Not on file  Social Connections: Not on file   No family history on file. Allergies  Allergen Reactions   Biaxin [Clarithromycin] Other (See Comments)    Vertigo   Floxacillin [Flucloxacillin] Other (See Comments)    Vertigo   Morphine And Related Other (See Comments)    Headache   Prior to Admission medications   Medication Sig Start Date End Date Taking? Authorizing Provider  acetaminophen (TYLENOL) 500 MG tablet Take 2 tablets (1,000 mg total) by mouth every 6 (six) hours as needed. 10/29/20 10/29/21  Stechschulte, Nickola Major, MD  amLODipine (NORVASC) 10 MG tablet Take 10 mg by mouth daily. 10/26/20   [provider]  amoxicillin-clavulanate (AUGMENTIN) 875-125 MG tablet Take 1 tablet by mouth 2 (two) times daily. 06/09/21   Rozetta Nunnery, MD  Azilsartan-Chlorthalidone 40-25 MG TABS Take 1 tablet by mouth daily. 10/27/20   [provider]  ciprofloxacin-dexamethasone (CIPRODEX) OTIC suspension  Place 4 drops into the right ear 2 (two) times daily. 4 drops in the right ear twice daily after the tube is placed in the ear. 07/01/21   Rozetta Nunnery, MD  Dexlansoprazole (DEXILANT) 30 MG capsule Take 30 mg by mouth daily as needed (reflux).    [provider]  escitalopram (LEXAPRO) 10 MG tablet Take 10 mg by mouth at bedtime.    [provider]  levothyroxine (SYNTHROID) 137 MCG tablet Take 137 mcg by mouth daily before breakfast.    [provider]  nebivolol (BYSTOLIC) 10 MG tablet Take 10 mg by mouth daily.    [provider]  oxyCODONE-acetaminophen (PERCOCET) 5-325 MG tablet Take 1 tablet by mouth every 4 (four) hours as needed for  severe pain. 10/29/20 10/29/21  Stechschulte, Nickola Major, MD  triamcinolone (NASACORT) 55 MCG/ACT AERO nasal inhaler Place 2 sprays into the nose daily. 2 sprays each nostril at night 06/09/21   Rozetta Nunnery, MD     Positive ROS: Otherwise negative  All other systems have been reviewed and were otherwise negative with the exception of those mentioned in the HPI and as above.  Physical Exam: Constitutional: Alert, well-appearing, no acute distress Ears: External ears without lesions or tenderness. Ear canals are clear bilaterally.  The left TM is relatively clear.  The right TM is retracted and very thickened with tympanosclerosis. Procedure.  Using a topical phenol anesthesia and topical Xylocaine in the ear canal attempted myringotomy was performed but the TM was very thickened and I cannot successfully perform a myringotomy in the office.  When I felt like I performed a myringotomy there was very thickened almost granulation appearing tissue in the middle ear space that bled.  This was controlled with topical Xylocaine with epi.  I could not successfully performed a myringotomy and tube placement in the office today and placed Ciprodex drops in her ear.  I was unable to insufflate air into the middle ear space myself. Nasal: External nose without lesions. Septum with minimal deformity and mild rhinitis. Clear nasal passages Oral: Lips and gums without lesions. Tongue and palate mucosa without lesions. Posterior oropharynx clear. Neck: No palpable adenopathy or masses Respiratory: Breathing comfortably  Skin: No facial/neck lesions or rash noted.  Procedures  Assessment: Right ear mixed hearing loss with SRT of 60 dB. Left ear hearing loss with SRT of 25 DB.  Plan: Reviewed with her that I was unable to perform a myringotomy tube placement in the office today and recommended use of nasal steroid spray which she is already doing as well as to try to pop her ears and perhaps try the "ear  popper" or Eustachi which is a similar instrument. Recommended using the antibiotic eardrops tonight and if she has any further bleeding or pain in the ear. I discussed with her that at this point would recommend proceeding with use of hearing aid in the right ear and possible in the left ear depending on audiologist recommendation. She will follow-up as needed.   Radene Journey, MD

## 2021-07-06 ENCOUNTER — Encounter (INDEPENDENT_AMBULATORY_CARE_PROVIDER_SITE_OTHER): Payer: Self-pay

## 2021-08-10 ENCOUNTER — Encounter (HOSPITAL_COMMUNITY): Payer: Self-pay

## 2021-08-10 ENCOUNTER — Other Ambulatory Visit: Payer: Self-pay

## 2021-08-10 ENCOUNTER — Emergency Department (HOSPITAL_COMMUNITY)
Admission: EM | Admit: 2021-08-10 | Discharge: 2021-08-10 | Disposition: A | Discharge status: Home / Self Care | Payer: Medicare Other | Attending: Emergency Medicine | Admitting: Emergency Medicine

## 2021-08-10 ENCOUNTER — Emergency Department (HOSPITAL_COMMUNITY): Payer: Medicare Other

## 2021-08-10 DIAGNOSIS — J45909 Unspecified asthma, uncomplicated: Secondary | ICD-10-CM | POA: Diagnosis not present

## 2021-08-10 DIAGNOSIS — E039 Hypothyroidism, unspecified: Secondary | ICD-10-CM | POA: Diagnosis not present

## 2021-08-10 DIAGNOSIS — R0602 Shortness of breath: Secondary | ICD-10-CM | POA: Insufficient documentation

## 2021-08-10 DIAGNOSIS — Z20822 Contact with and (suspected) exposure to covid-19: Secondary | ICD-10-CM | POA: Diagnosis not present

## 2021-08-10 DIAGNOSIS — J449 Chronic obstructive pulmonary disease, unspecified: Secondary | ICD-10-CM | POA: Diagnosis not present

## 2021-08-10 DIAGNOSIS — F17210 Nicotine dependence, cigarettes, uncomplicated: Secondary | ICD-10-CM | POA: Diagnosis not present

## 2021-08-10 DIAGNOSIS — J441 Chronic obstructive pulmonary disease with (acute) exacerbation: Secondary | ICD-10-CM

## 2021-08-10 DIAGNOSIS — R059 Cough, unspecified: Secondary | ICD-10-CM | POA: Insufficient documentation

## 2021-08-10 DIAGNOSIS — Z79899 Other long term (current) drug therapy: Secondary | ICD-10-CM | POA: Diagnosis not present

## 2021-08-10 DIAGNOSIS — I10 Essential (primary) hypertension: Secondary | ICD-10-CM | POA: Insufficient documentation

## 2021-08-10 DIAGNOSIS — J189 Pneumonia, unspecified organism: Secondary | ICD-10-CM

## 2021-08-10 LAB — COMPREHENSIVE METABOLIC PANEL
ALT: 20 U/L (ref 0–44)
AST: 20 U/L (ref 15–41)
Albumin: 4.5 g/dL (ref 3.5–5.0)
Alkaline Phosphatase: 60 U/L (ref 38–126)
Anion gap: 7 (ref 5–15)
BUN: 12 mg/dL (ref 8–23)
CO2: 25 mmol/L (ref 22–32)
Calcium: 9.4 mg/dL (ref 8.9–10.3)
Chloride: 110 mmol/L (ref 98–111)
Creatinine, Ser: 0.66 mg/dL (ref 0.44–1.00)
GFR, Estimated: >60 mL/min (ref >60)
Glucose, Bld: 113 mg/dL — ABNORMAL HIGH (ref 70–99)
Potassium: 3.9 mmol/L (ref 3.5–5.1)
Sodium: 142 mmol/L (ref 135–145)
Total Bilirubin: 0.3 mg/dL (ref 0.3–1.2)
Total Protein: 7.6 g/dL (ref 6.5–8.1)

## 2021-08-10 LAB — URINALYSIS, ROUTINE W REFLEX MICROSCOPIC
Bilirubin Urine: NEGATIVE
Glucose, UA: NEGATIVE mg/dL
Hgb urine dipstick: NEGATIVE
Ketones, ur: NEGATIVE mg/dL
Nitrite: NEGATIVE
Protein, ur: NEGATIVE mg/dL
Specific Gravity, Urine: 1.009 (ref 1.005–1.030)
pH: 6 (ref 5.0–8.0)

## 2021-08-10 LAB — CBC WITH DIFFERENTIAL/PLATELET
Abs Immature Granulocytes: 0.05 10*3/uL (ref 0.00–0.07)
Basophils Absolute: 0.1 10*3/uL (ref 0.0–0.1)
Basophils Relative: 1 %
Eosinophils Absolute: 0.1 10*3/uL (ref 0.0–0.5)
Eosinophils Relative: 1 %
HCT: 26.8 % — ABNORMAL LOW (ref 36.0–46.0)
Hemoglobin: 7.9 g/dL — ABNORMAL LOW (ref 12.0–15.0)
Immature Granulocytes: 1 %
Lymphocytes Relative: 26 %
Lymphs Abs: 2.5 10*3/uL (ref 0.7–4.0)
MCH: 24 pg — ABNORMAL LOW (ref 26.0–34.0)
MCHC: 29.5 g/dL — ABNORMAL LOW (ref 30.0–36.0)
MCV: 81.5 fL (ref 80.0–100.0)
Monocytes Absolute: 0.8 10*3/uL (ref 0.1–1.0)
Monocytes Relative: 8 %
Neutro Abs: 6.1 10*3/uL (ref 1.7–7.7)
Neutrophils Relative %: 63 %
Platelets: 309 10*3/uL (ref 150–400)
RBC: 3.29 MIL/uL — ABNORMAL LOW (ref 3.87–5.11)
RDW: 15.9 % — ABNORMAL HIGH (ref 11.5–15.5)
WBC: 9.6 10*3/uL (ref 4.0–10.5)
nRBC: 0 % (ref 0.0–0.2)

## 2021-08-10 LAB — BLOOD GAS, VENOUS
Acid-base deficit: 0 mmol/L (ref 0.0–2.0)
Bicarbonate: 24 mmol/L (ref 20.0–28.0)
FIO2: 21
O2 Saturation: 89.9 %
Patient temperature: 98.6
pCO2, Ven: 38.9 mmHg — ABNORMAL LOW (ref 44.0–60.0)
pH, Ven: 7.407 (ref 7.250–7.430)
pO2, Ven: 58.2 mmHg — ABNORMAL HIGH (ref 32.0–45.0)

## 2021-08-10 LAB — RESP PANEL BY RT-PCR (FLU A&B, COVID) ARPGX2
Influenza A by PCR: NEGATIVE
Influenza B by PCR: NEGATIVE
SARS Coronavirus 2 by RT PCR: NEGATIVE

## 2021-08-10 LAB — PHOSPHORUS: Phosphorus: 3.3 mg/dL (ref 2.5–4.6)

## 2021-08-10 LAB — PROTIME-INR
INR: 1.1 (ref 0.8–1.2)
Prothrombin Time: 14 seconds (ref 11.4–15.2)

## 2021-08-10 LAB — BRAIN NATRIURETIC PEPTIDE: B Natriuretic Peptide: 147.8 pg/mL — ABNORMAL HIGH (ref 0.0–100.0)

## 2021-08-10 LAB — MAGNESIUM: Magnesium: 2.2 mg/dL (ref 1.7–2.4)

## 2021-08-10 LAB — TROPONIN I (HIGH SENSITIVITY)
Troponin I (High Sensitivity): 13 ng/L (ref <18)
Troponin I (High Sensitivity): 11 ng/L (ref <18)

## 2021-08-10 MED ORDER — IPRATROPIUM-ALBUTEROL 0.5-2.5 (3) MG/3ML IN SOLN
3.0000 mL | Freq: Once | RESPIRATORY_TRACT | Status: AC
  Administered 2021-08-10: 3 mL via RESPIRATORY_TRACT
  Filled 2021-08-10: qty 3

## 2021-08-10 MED ORDER — DOXYCYCLINE HYCLATE 100 MG PO CAPS: 100.0000 mg | ORAL_CAPSULE | Freq: Two times a day (BID) | ORAL | 0 refills | Status: AC

## 2021-08-10 MED ORDER — PREDNISONE 20 MG PO TABS: 60.0000 mg | ORAL_TABLET | Freq: Every day | ORAL | 0 refills | Status: AC

## 2021-08-10 NOTE — Discharge Instructions (Addendum)
Recommend getting your blood count rechecked later this week or early next week.  Take steroids and antibiotics for presumed COPD exacerbation and pneumonia.

## 2021-08-10 NOTE — ED Provider Notes (Signed)
[facility] DEPT Provider Note   CSN: [id number] Arrival date & time: 08/10/21  0810     History Chief Complaint  Patient presents with   Cough    Gwendolyn [name] is a 65 y.o. female.  HPI Patient is a gradual onset of shortness of breath for the past 3 weeks.  She has started to notice shortness of breath with exertion.  It has been improving with rest.  Patient denies any associated chest pain.  She has not had fevers.  No body aches.  Reports she has had some cough nonproductive.  More recently now she is becoming short of breath at night if she is lying down.  She reports is gotten to the point where she is having to take short breaks at work now because she is getting short of breath just with walking small distances.  She is not experiencing any lower extremity swelling.  No calf pain.  Patient has no prior history of CHF or heart disease.  Patient has been a longtime smoker approximately 1 pack/day.  He denies any formal diagnosis of COPD.  She does not have any inhalers.    Past Medical History:  Diagnosis Date   Anemia    Anxiety    Asthma    COPD (chronic obstructive pulmonary disease) (HCC)    Depression    GERD (gastroesophageal reflux disease)    History of kidney stones    Hypertension    Hypothyroidism     There are no problems to display for this patient.   Past Surgical History:  Procedure Laterality Date   ABDOMINAL HYSTERECTOMY     Cesarean Section     (2)   CHOLECYSTECTOMY     COLONOSCOPY  2020   Gastric Ulcer  9562   UMBILICAL HERNIA REPAIR N/A 10/29/2020   Procedure: OPEN UMBILICAL HERNIA REPAIR WITH MESH;  Surgeon: Stechschulte, Nickola Major, MD;  Location: [facility];  Service: General;  Laterality: N/A;     OB History   No obstetric history on file.     History reviewed. No pertinent family history.  Social History   Tobacco Use   Smoking status: Every Day    Packs/day: 1.00    Types: Cigarettes   Smokeless  tobacco: Never  Vaping Use   Vaping Use: Never used  Substance Use Topics   Alcohol use: Never   Drug use: Never    Home Medications Prior to Admission medications   Medication Sig Start Date End Date Taking? Authorizing Provider  acetaminophen (TYLENOL) 500 MG tablet Take 2 tablets (1,000 mg total) by mouth every 6 (six) hours as needed. 10/29/20 10/29/21  Stechschulte, Nickola Major, MD  amLODipine (NORVASC) 10 MG tablet Take 10 mg by mouth daily. 10/26/20   [provider]  amoxicillin-clavulanate (AUGMENTIN) 875-125 MG tablet Take 1 tablet by mouth 2 (two) times daily. 06/09/21   Rozetta Nunnery, MD  Azilsartan-Chlorthalidone 40-25 MG TABS Take 1 tablet by mouth daily. 10/27/20   [provider]  ciprofloxacin-dexamethasone (CIPRODEX) OTIC suspension Place 4 drops into the right ear 2 (two) times daily. 4 drops in the right ear twice daily after the tube is placed in the ear. 07/01/21   Rozetta Nunnery, MD  Dexlansoprazole (DEXILANT) 30 MG capsule Take 30 mg by mouth daily as needed (reflux).    [provider]  escitalopram (LEXAPRO) 10 MG tablet Take 10 mg by mouth at bedtime.    [provider]  levothyroxine (SYNTHROID)  137 MCG tablet Take 137 mcg by mouth daily before breakfast.    [provider]  nebivolol (BYSTOLIC) 10 MG tablet Take 10 mg by mouth daily.    [provider]  oxyCODONE-acetaminophen (PERCOCET) 5-325 MG tablet Take 1 tablet by mouth every 4 (four) hours as needed for severe pain. 10/29/20 10/29/21  Stechschulte, Nickola Major, MD  triamcinolone (NASACORT) 55 MCG/ACT AERO nasal inhaler Place 2 sprays into the nose daily. 2 sprays each nostril at night 06/09/21   Rozetta Nunnery, MD    Allergies    Biaxin [clarithromycin], Floxacillin [flucloxacillin], and Morphine and related  Review of Systems   Review of Systems 10 systems reviewed and negative except as per HPI Physical Exam Updated Vital Signs BP (!)  211/67 (BP Location: Right Arm)   Pulse 94   Temp 98.2 F (36.8 C) (Oral)   Resp 16   Ht 4' 11.5" (1.511 m)   Wt 77.1 kg   LMP  (LMP Unknown)   SpO2 97%   BMI 33.76 kg/m   Physical Exam Constitutional:      Appearance: Normal appearance.  HENT:     Head: Normocephalic and atraumatic.     Mouth/Throat:     Pharynx: Oropharynx is clear.  Eyes:     Extraocular Movements: Extraocular movements intact.  Cardiovascular:     Rate and Rhythm: Normal rate and regular rhythm.  Pulmonary:     Effort: Pulmonary effort is normal.     Comments: Respiratory distress.  Patient's breath sounds are soft but no gross wheeze or rhonchi Abdominal:     General: There is no distension.     Palpations: Abdomen is soft.     Tenderness: There is no abdominal tenderness. There is no guarding.  Musculoskeletal:        General: No swelling or tenderness.     Right lower leg: No edema.     Left lower leg: No edema.  Skin:    General: Skin is warm and dry.  Neurological:     General: No focal deficit present.     Mental Status: She is alert and oriented to person, place, and time.     Coordination: Coordination normal.  Psychiatric:        Mood and Affect: Mood normal.    ED Results / Procedures / Treatments   Labs (all labs ordered are listed, but only abnormal results are displayed) Labs Reviewed - No data to display  EKG EKG Interpretation  Date/Time:  Tuesday August 10 2021 13:36:40 EDT Ventricular Rate:  88 PR Interval:  156 QRS Duration: 88 QT Interval:  383 QTC Calculation: 464 R Axis:   24 Text Interpretation: Sinus rhythm Probable anteroseptal infarct, old no change from previous Confirmed by Charlesetta Shanks [phone]) on 08/10/2021 5:20:53 PM  Radiology No results found.  Procedures Procedures   Medications Ordered in ED Medications - No data to display  ED Course  I have reviewed the triage vital signs and the nursing notes.  Pertinent labs & imaging results that  were available during my care of the patient were reviewed by me and considered in my medical decision making (see chart for details).    MDM Rules/Calculators/A&P                          Presents with exertional shortness of breath.  We will proceed with diagnostic work-up for CHF, COPD, metabolic or infectious etiology.  Patient is nontoxic.  She does not have oxygen requirement.  We will proceed with DuoNeb given smoking history and soft breath sounds.  Patient labs to return anemic.  Dr. Vernell Barrier assumed care at shift change for further evaluation of anemia and final disposition.  Final Clinical Impression(s) / ED Diagnoses Final diagnoses:  None    Rx / DC Orders ED Discharge Orders     None        Charlesetta Shanks, MD 08/10/21 1722

## 2021-08-10 NOTE — ED Triage Notes (Addendum)
Pt presents with c/o cough and some shortness of breath that started approx 3 weeks. Pt is a smoker. Pt denies any chest pain.

## 2021-08-10 NOTE — ED Provider Notes (Signed)
Patient signed out to me here with shortness of breath and cough.  Chest x-ray appears to be with mild pneumonia.  Seems like she is having some COPD symptoms.  Hemoglobin is 7.9.  This is significantly down from hemoglobin from last year.  She states that she has a history of iron/ferritin issues and follows with GI for this.  She intermittently takes medication for this.  She has brown stool on exam.  She denies any black or bloody stools.  Possible that her blood counts being low could be causing some of her symptoms as well recommend close follow-up for repeat blood work and that she discuss restarting some of her iron medications with primary doctor and GI.  Overall she appears well.  No concern for acute GI bleed.  Discharged in good condition.  Understands return precautions.  This chart was dictated using voice recognition software.  Despite best efforts to proofread,  errors can occur which can change the documentation meaning.    Lennice Sites, DO 08/10/21 1800

## 2021-08-15 ENCOUNTER — Other Ambulatory Visit: Payer: Self-pay

## 2021-08-15 ENCOUNTER — Emergency Department (HOSPITAL_COMMUNITY)
Admission: EM | Admit: 2021-08-15 | Discharge: 2021-08-15 | Disposition: A | Discharge status: Home / Self Care | Payer: Medicare Other | Attending: Emergency Medicine | Admitting: Emergency Medicine

## 2021-08-15 ENCOUNTER — Encounter (HOSPITAL_COMMUNITY): Payer: Self-pay | Admitting: Emergency Medicine

## 2021-08-15 DIAGNOSIS — R220 Localized swelling, mass and lump, head: Secondary | ICD-10-CM | POA: Diagnosis not present

## 2021-08-15 DIAGNOSIS — R0602 Shortness of breath: Secondary | ICD-10-CM | POA: Diagnosis present

## 2021-08-15 DIAGNOSIS — Z5321 Procedure and treatment not carried out due to patient leaving prior to being seen by health care provider: Secondary | ICD-10-CM | POA: Insufficient documentation

## 2021-08-15 LAB — CBC WITH DIFFERENTIAL/PLATELET
Abs Immature Granulocytes: 0.22 10*3/uL — ABNORMAL HIGH (ref 0.00–0.07)
Basophils Absolute: 0.1 10*3/uL (ref 0.0–0.1)
Basophils Relative: 1 %
Eosinophils Absolute: 0.1 10*3/uL (ref 0.0–0.5)
Eosinophils Relative: 0 %
HCT: 29.4 % — ABNORMAL LOW (ref 36.0–46.0)
Hemoglobin: 8.6 g/dL — ABNORMAL LOW (ref 12.0–15.0)
Immature Granulocytes: 1 %
Lymphocytes Relative: 13 %
Lymphs Abs: 2.2 10*3/uL (ref 0.7–4.0)
MCH: 23.2 pg — ABNORMAL LOW (ref 26.0–34.0)
MCHC: 29.3 g/dL — ABNORMAL LOW (ref 30.0–36.0)
MCV: 79.2 fL — ABNORMAL LOW (ref 80.0–100.0)
Monocytes Absolute: 0.8 10*3/uL (ref 0.1–1.0)
Monocytes Relative: 5 %
Neutro Abs: 13.6 10*3/uL — ABNORMAL HIGH (ref 1.7–7.7)
Neutrophils Relative %: 80 %
Platelets: 385 10*3/uL (ref 150–400)
RBC: 3.71 MIL/uL — ABNORMAL LOW (ref 3.87–5.11)
RDW: 16.6 % — ABNORMAL HIGH (ref 11.5–15.5)
WBC: 17 10*3/uL — ABNORMAL HIGH (ref 4.0–10.5)
nRBC: 0 % (ref 0.0–0.2)

## 2021-08-15 LAB — BASIC METABOLIC PANEL
Anion gap: 9 (ref 5–15)
BUN: 21 mg/dL (ref 8–23)
CO2: 24 mmol/L (ref 22–32)
Calcium: 9.1 mg/dL (ref 8.9–10.3)
Chloride: 104 mmol/L (ref 98–111)
Creatinine, Ser: 0.91 mg/dL (ref 0.44–1.00)
GFR, Estimated: >60 mL/min (ref >60)
Glucose, Bld: 100 mg/dL — ABNORMAL HIGH (ref 70–99)
Potassium: 4.4 mmol/L (ref 3.5–5.1)
Sodium: 137 mmol/L (ref 135–145)

## 2021-08-15 MED ORDER — FAMOTIDINE 20 MG PO TABS
20.0000 mg | ORAL_TABLET | Freq: Once | ORAL | Status: DC
Start: 2021-08-15 — End: 2021-08-15

## 2021-08-15 MED ORDER — DIPHENHYDRAMINE HCL 25 MG PO CAPS
50.0000 mg | ORAL_CAPSULE | Freq: Once | ORAL | Status: DC
Start: 2021-08-15 — End: 2021-08-15

## 2021-08-15 MED ORDER — METHYLPREDNISOLONE SODIUM SUCC 125 MG IJ SOLR
125.0000 mg | Freq: Once | INTRAMUSCULAR | Status: DC
Start: 2021-08-15 — End: 2021-08-15

## 2021-08-15 NOTE — ED Notes (Signed)
Called pt no answer and I do not see pt in lobby

## 2021-08-15 NOTE — ED Triage Notes (Signed)
Pt states she feels like her tongue is swelling since last night and she believes it is related to her toothpaste since brushing her teeth last night.  Pt is taking Doxycycline and Prednisone since diagnosed with pneumonia on 10/4.  Speaking in complete sentences.

## 2021-08-15 NOTE — ED Provider Notes (Signed)
Emergency Medicine Provider Triage Evaluation Note  Gwendolyn [name] , a 65 y.o. female  was evaluated in triage.  Pt complains of shortness of breath, tongue swelling, onset last night, concern for allergic rxn.  Patient is on antibiotics for pneumonia following recent hospital admission.  Also used a new toothpaste last night which contained scope, unsure if this was the cause.  Symptoms progressively worsening, has not taken anything at home prior to arrival.  Review of Systems  Positive: Shortness of breath, tongue swelling Negative: Rash, itching, vomiting  Physical Exam  LMP  (LMP Unknown)  Gen:   Awake, no distress, slightly tachypneic although respirations unlabored. Resp:  Lungs clear to auscultation, no wheezing MSK:   Moves extremities without difficulty  Other:  Patient appears uncomfortable although no significant swelling observed.  Medical Decision Making  Medically screening exam initiated at 9:33 AM.  Appropriate orders placed.  Gwendolyn [name] was informed that the remainder of the evaluation will be completed by another provider, this initial triage assessment does not replace that evaluation, and the importance of remaining in the ED until their evaluation is complete.  IM solumedrol, PO benadryl and pepcid ordered   Tacy Learn, PA-C 08/15/21 5993    Regan Lemming, MD 08/15/21 [phone]

## 2021-08-27 ENCOUNTER — Emergency Department (HOSPITAL_COMMUNITY)
Admission: EM | Admit: 2021-08-27 | Discharge: 2021-08-27 | Disposition: A | Discharge status: Home / Self Care | Payer: Medicare Other | Attending: Emergency Medicine | Admitting: Emergency Medicine

## 2021-08-27 ENCOUNTER — Encounter (HOSPITAL_COMMUNITY): Payer: Self-pay | Admitting: Emergency Medicine

## 2021-08-27 ENCOUNTER — Other Ambulatory Visit: Payer: Self-pay | Admitting: Internal Medicine

## 2021-08-27 ENCOUNTER — Ambulatory Visit
Admission: RE | Admit: 2021-08-27 | Discharge: 2021-08-27 | Disposition: A | Discharge status: Home / Self Care | Payer: Medicare Other | Source: Ambulatory Visit | Attending: Internal Medicine | Admitting: Internal Medicine

## 2021-08-27 ENCOUNTER — Other Ambulatory Visit: Payer: Self-pay

## 2021-08-27 DIAGNOSIS — I779 Disorder of arteries and arterioles, unspecified: Secondary | ICD-10-CM

## 2021-08-27 DIAGNOSIS — F17210 Nicotine dependence, cigarettes, uncomplicated: Secondary | ICD-10-CM | POA: Insufficient documentation

## 2021-08-27 DIAGNOSIS — R109 Unspecified abdominal pain: Secondary | ICD-10-CM

## 2021-08-27 DIAGNOSIS — Z79899 Other long term (current) drug therapy: Secondary | ICD-10-CM | POA: Insufficient documentation

## 2021-08-27 DIAGNOSIS — E785 Hyperlipidemia, unspecified: Secondary | ICD-10-CM | POA: Diagnosis not present

## 2021-08-27 DIAGNOSIS — E039 Hypothyroidism, unspecified: Secondary | ICD-10-CM | POA: Insufficient documentation

## 2021-08-27 DIAGNOSIS — I70213 Atherosclerosis of native arteries of extremities with intermittent claudication, bilateral legs: Secondary | ICD-10-CM | POA: Diagnosis not present

## 2021-08-27 DIAGNOSIS — K219 Gastro-esophageal reflux disease without esophagitis: Secondary | ICD-10-CM | POA: Diagnosis not present

## 2021-08-27 DIAGNOSIS — J449 Chronic obstructive pulmonary disease, unspecified: Secondary | ICD-10-CM | POA: Insufficient documentation

## 2021-08-27 DIAGNOSIS — J45909 Unspecified asthma, uncomplicated: Secondary | ICD-10-CM | POA: Diagnosis not present

## 2021-08-27 DIAGNOSIS — K5792 Diverticulitis of intestine, part unspecified, without perforation or abscess without bleeding: Secondary | ICD-10-CM

## 2021-08-27 DIAGNOSIS — R1010 Upper abdominal pain, unspecified: Secondary | ICD-10-CM

## 2021-08-27 DIAGNOSIS — I10 Essential (primary) hypertension: Secondary | ICD-10-CM | POA: Insufficient documentation

## 2021-08-27 DIAGNOSIS — I359 Nonrheumatic aortic valve disorder, unspecified: Secondary | ICD-10-CM | POA: Insufficient documentation

## 2021-08-27 DIAGNOSIS — K529 Noninfective gastroenteritis and colitis, unspecified: Secondary | ICD-10-CM | POA: Insufficient documentation

## 2021-08-27 DIAGNOSIS — I700 Atherosclerosis of aorta: Secondary | ICD-10-CM | POA: Diagnosis not present

## 2021-08-27 LAB — COMPREHENSIVE METABOLIC PANEL
ALT: 21 U/L (ref 0–44)
AST: 20 U/L (ref 15–41)
Albumin: 4 g/dL (ref 3.5–5.0)
Alkaline Phosphatase: 60 U/L (ref 38–126)
Anion gap: 8 (ref 5–15)
BUN: 8 mg/dL (ref 8–23)
CO2: 23 mmol/L (ref 22–32)
Calcium: 8.9 mg/dL (ref 8.9–10.3)
Chloride: 105 mmol/L (ref 98–111)
Creatinine, Ser: 0.7 mg/dL (ref 0.44–1.00)
GFR, Estimated: >60 mL/min (ref >60)
Glucose, Bld: 114 mg/dL — ABNORMAL HIGH (ref 70–99)
Potassium: 3.6 mmol/L (ref 3.5–5.1)
Sodium: 136 mmol/L (ref 135–145)
Total Bilirubin: 0.4 mg/dL (ref 0.3–1.2)
Total Protein: 6.5 g/dL (ref 6.5–8.1)

## 2021-08-27 LAB — CBC
HCT: 30.5 % — ABNORMAL LOW (ref 36.0–46.0)
Hemoglobin: 8.7 g/dL — ABNORMAL LOW (ref 12.0–15.0)
MCH: 22.8 pg — ABNORMAL LOW (ref 26.0–34.0)
MCHC: 28.5 g/dL — ABNORMAL LOW (ref 30.0–36.0)
MCV: 79.8 fL — ABNORMAL LOW (ref 80.0–100.0)
Platelets: 302 10*3/uL (ref 150–400)
RBC: 3.82 MIL/uL — ABNORMAL LOW (ref 3.87–5.11)
RDW: 18 % — ABNORMAL HIGH (ref 11.5–15.5)
WBC: 10 10*3/uL (ref 4.0–10.5)
nRBC: 0 % (ref 0.0–0.2)

## 2021-08-27 LAB — URINALYSIS, ROUTINE W REFLEX MICROSCOPIC
Bacteria, UA: NONE SEEN
Bilirubin Urine: NEGATIVE
Glucose, UA: NEGATIVE mg/dL
Hgb urine dipstick: NEGATIVE
Ketones, ur: NEGATIVE mg/dL
Nitrite: NEGATIVE
Protein, ur: NEGATIVE mg/dL
Specific Gravity, Urine: >1.046 — ABNORMAL HIGH (ref 1.005–1.030)
pH: 6 (ref 5.0–8.0)

## 2021-08-27 LAB — LACTIC ACID, PLASMA: Lactic Acid, Venous: 1.1 mmol/L (ref 0.5–1.9)

## 2021-08-27 LAB — TSH: TSH: 0.706 u[IU]/mL (ref 0.350–4.500)

## 2021-08-27 LAB — LIPASE, BLOOD: Lipase: 28 U/L (ref 11–51)

## 2021-08-27 MED ORDER — CIPROFLOXACIN HCL 500 MG PO TABS: 500.0000 mg | ORAL_TABLET | Freq: Two times a day (BID) | ORAL | 0 refills | Status: DC

## 2021-08-27 MED ORDER — METRONIDAZOLE 500 MG PO TABS: 500.0000 mg | ORAL_TABLET | Freq: Two times a day (BID) | ORAL | 0 refills | Status: DC

## 2021-08-27 MED ORDER — LABETALOL HCL 5 MG/ML IV SOLN
20.0000 mg | Freq: Once | INTRAVENOUS | Status: DC
Start: 2021-08-27 — End: 2021-08-27
  Filled 2021-08-27: qty 4

## 2021-08-27 MED ORDER — IOPAMIDOL (ISOVUE-370) INJECTION 76%
100.0000 mL | Freq: Once | INTRAVENOUS | Status: AC | PRN
  Administered 2021-08-27: 100 mL via INTRAVENOUS

## 2021-08-27 NOTE — Discharge Instructions (Addendum)
Please take Cipro and Flagyl for possible colitis. Follow-up as Dr. Lou Cal advised regarding your ultrasound of your carotid arteries Please take your home blood pressure medications and have this rechecked as an outpatient Have your blood count rechecked by your doctor in the next week. Return to the emergency department if you are having any worsening symptoms at any time. Do not smoke cigarettes

## 2021-08-27 NOTE — ED Notes (Signed)
Vascular at bedside

## 2021-08-27 NOTE — ED Triage Notes (Signed)
Pt with abdominal pain and diarrhea x 1 day. Had abdominal CT done today and states she was sent here for colitis and abdominal blockage.

## 2021-08-27 NOTE — ED Provider Notes (Addendum)
[facility] EMERGENCY DEPARTMENT Provider Note   CSN: [id number] Arrival date & time: 08/27/21  1124     History No chief complaint on file.   Gwendolyn [name] is a 65 y.o. female.  HPI 65 year old female smoker, history of COPD, history of hypertension and hypothyroidism who presents today secondary to abnormal CT scan.  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  She states that yesterday she had some crampy abdominal pain and he ordered outpatient CAT scan.  The outpatient CAT scan reveals almost complete occlusion of her abdominal aorta at the SMA level.  Patient endorses that she has had some leg weakness with walking recently.  + Mild pain.  She also has recently had pneumonia and has been informed of anemia.  She reports that she became anemic with a bleed from a polyp 2 years ago.  She thought that had been corrected.  However, when she was seen for pneumonia that was a long ED in October she was noted to again be anemic with a hemoglobin of 8.6.  She states that at that time her stool was checked and she had not noted any bleeding.  She has since been started back on iron and stools have been dark since then.  Has not had any bright red blood.  Denies fever, chills, nausea, vomiting, diarrhea, UTI symptoms.    Past Medical History:  Diagnosis Date   Anemia    Anxiety    Asthma    COPD (chronic obstructive pulmonary disease) (HCC)    Depression    GERD (gastroesophageal reflux disease)    History of kidney stones    Hypertension    Hypothyroidism     There are no problems to display for this patient.   Past Surgical History:  Procedure Laterality Date   ABDOMINAL HYSTERECTOMY     Cesarean Section     (2)   CHOLECYSTECTOMY     COLONOSCOPY  2020   Gastric Ulcer  8676   UMBILICAL HERNIA REPAIR N/A 10/29/2020   Procedure: OPEN UMBILICAL HERNIA REPAIR WITH MESH;  Surgeon: Stechschulte, Nickola Major, MD;  Location: [facility];  Service:  General;  Laterality: N/A;     OB History   No obstetric history on file.     No family history on file.  Social History   Tobacco Use   Smoking status: Every Day    Packs/day: 1.00    Types: Cigarettes   Smokeless tobacco: Never  Vaping Use   Vaping Use: Never used  Substance Use Topics   Alcohol use: Never   Drug use: Never    Home Medications Prior to Admission medications   Medication Sig Start Date End Date Taking? Authorizing Provider  acetaminophen (TYLENOL) 500 MG tablet Take 2 tablets (1,000 mg total) by mouth every 6 (six) hours as needed. 10/29/20 10/29/21  Stechschulte, Nickola Major, MD  amLODipine (NORVASC) 10 MG tablet Take 10 mg by mouth daily. 10/26/20   [provider]  Azilsartan-Chlorthalidone 40-25 MG TABS Take 1 tablet by mouth daily. 10/27/20   [provider]  ciprofloxacin-dexamethasone (CIPRODEX) OTIC suspension Place 4 drops into the right ear 2 (two) times daily. 4 drops in the right ear twice daily after the tube is placed in the ear. 07/01/21   Rozetta Nunnery, MD  Dexlansoprazole (DEXILANT) 30 MG capsule Take 30 mg by mouth daily as needed (reflux).    [provider]  escitalopram (LEXAPRO) 10 MG tablet Take  10 mg by mouth at bedtime.    [provider]  levothyroxine (SYNTHROID) 137 MCG tablet Take 137 mcg by mouth daily before breakfast.    [provider]  nebivolol (BYSTOLIC) 10 MG tablet Take 10 mg by mouth daily.    [provider]  oxyCODONE-acetaminophen (PERCOCET) 5-325 MG tablet Take 1 tablet by mouth every 4 (four) hours as needed for severe pain. 10/29/20 10/29/21  Stechschulte, Nickola Major, MD  triamcinolone (NASACORT) 55 MCG/ACT AERO nasal inhaler Place 2 sprays into the nose daily. 2 sprays each nostril at night 06/09/21   Rozetta Nunnery, MD    Allergies    Doxycycline, Biaxin [clarithromycin], Floxacillin [flucloxacillin], and Morphine and related  Review of Systems   Review  of Systems  All other systems reviewed and are negative.  Physical Exam Updated Vital Signs BP (!) 239/83 (BP Location: Right Arm)   Pulse (!) 113   Temp 99.1 F (37.3 C) (Oral)   Resp 18   LMP  (LMP Unknown)   SpO2 100%   Physical Exam Vitals and nursing note reviewed.  Constitutional:      Appearance: Normal appearance. She is obese.     Comments: Pale  HENT:     Head: Normocephalic.     Right Ear: External ear normal.     Nose: Nose normal.     Mouth/Throat:     Mouth: Mucous membranes are dry.     Pharynx: Oropharynx is clear.  Eyes:     Extraocular Movements: Extraocular movements intact.     Pupils: Pupils are equal, round, and reactive to light.     Comments: Proptotic  Cardiovascular:     Rate and Rhythm: Normal rate and regular rhythm.     Heart sounds: Normal heart sounds.     Comments: Lateral femoral pulses are strong Palpable left DP pulse Right DP is not palpable and not able to obtain Doppler Doppler obtained of right popliteal Pulmonary:     Effort: Pulmonary effort is normal.  Abdominal:     General: Bowel sounds are normal. There is distension.     Tenderness: There is no abdominal tenderness. There is no right CVA tenderness, left CVA tenderness or guarding.  Musculoskeletal:        General: Normal range of motion.     Cervical back: Normal range of motion.  Skin:    General: Skin is warm and dry.  Neurological:     General: No focal deficit present.     Mental Status: She is alert.  Psychiatric:        Mood and Affect: Mood normal.        Behavior: Behavior normal.    ED Results / Procedures / Treatments   Labs (all labs ordered are listed, but only abnormal results are displayed) Labs Reviewed  COMPREHENSIVE METABOLIC PANEL - Abnormal; Notable for the following components:      Result Value   Glucose, Bld 114 (*)    All other components within normal limits  CBC - Abnormal; Notable for the following components:   RBC 3.82 (*)     Hemoglobin 8.7 (*)    HCT 30.5 (*)    MCV 79.8 (*)    MCH 22.8 (*)    MCHC 28.5 (*)    RDW 18.0 (*)    All other components within normal limits  URINALYSIS, ROUTINE W REFLEX MICROSCOPIC - Abnormal; Notable for the following components:   Specific Gravity, Urine >1.046 (*)  Leukocytes,Ua SMALL (*)    All other components within normal limits  LIPASE, BLOOD  LACTIC ACID, PLASMA    EKG None  Radiology CT ABDOMEN PELVIS W CONTRAST  Addendum Date: 08/27/2021   ADDENDUM REPORT: 08/27/2021 11:41 ADDENDUM: Study discussed by telephone with Dr. Jilda Panda on 08/27/2021 at 1057 hours. Electronically Signed   By: Genevie Ann M.D.   On: 08/27/2021 11:41   Result Date: 08/27/2021 CLINICAL DATA:  65 year old female with 2 days of epigastric pain, bloating. History of umbilical hernia repair, appendectomy, hysterectomy, cholecystectomy. EXAM: CT ABDOMEN AND PELVIS WITH CONTRAST TECHNIQUE: Multidetector CT imaging of the abdomen and pelvis was performed using the standard protocol following bolus administration of intravenous contrast. CONTRAST:  197mL ISOVUE-370 IOPAMIDOL (ISOVUE-370) INJECTION 76% COMPARISON:  11/22/2006 CT Abdomen. FINDINGS: Lower chest: Mild cardiac enlargement since 2008, heart size now at the upper limits of normal. Trace pericardial effusion, but probably physiologic. However, there are also trace bilateral layering pleural effusions more so on the right. Mild superimposed lung base scarring or atelectasis. Hepatobiliary: Absent gallbladder. Liver enhancement is within normal limits. No biliary ductal dilatation, although there is mild inflammatory stranding at the porta hepatis such as on coronal image 75. See also large bowel details below. Pancreas: Negative. Spleen: Negative. Adrenals/Urinary Tract: Negative adrenal glands. Kidneys are nonobstructed with symmetric enhancement. Bilateral nephrolithiasis, bulky in the right lower pole where developing staghorn calculus is noted up  to 18 mm on coronal image 106. Mild inflammatory stranding layering along the anterior right pararenal space corresponds to that about the hepatic flexure, described below. On the delayed images renal contrast excretion is symmetric. Proximal ureters are normal. Numerous pelvic phleboliths. Unremarkable bladder. Stomach/Bowel: Decompressed descending and rectosigmoid colon. Redundant transverse colon also mostly decompressed. In the right upper quadrant where the hepatic flexure is in proximity to the porta hepatis there is mild inflammatory stranding (series 2 images 30-33. This might be a mild regional colitis, uncertain. The ascending colon appears more normal. The cecum is on a lax mesentery located anteriorly. Terminal ileum is decompressed and negative. No other large bowel inflammation. No dilated small bowel. Decompressed stomach. Duodenum appears within normal limits. No free air. No free fluid. Chronic postoperative changes to the umbilicus. Vascular/Lymphatic: Very severe calcified atherosclerosis of the proximal abdominal aorta, including bulky plaque likely crossing high-grade stenosis of the celiac axis (series 2, image 20), and near aortic occlusion at the level of the SMA origin (image 24). Despite this, there is maintained contrast within the lumen of the more distal aorta and the major aortic branches remain patent. There is likely high-grade renal artery origin stenosis also. Similar advanced calcified atherosclerosis of the Common iliac arteries greater on the left with high-grade stenosis on series 2, image 41. But the major arterial structures in the pelvis remain patent. Portal venous system is patent. No lymphadenopathy identified. But there are multiple dystrophic calcifications in the mesentery around the lesser sac where pancreatitis was demonstrated in 2008. These are likely postinflammatory and some may be chronically calcified lymph nodes. Reproductive: Surgically absent. Other: No  pelvic free fluid. Musculoskeletal: No acute osseous abnormality identified. IMPRESSION: 1. Very severe calcified atherosclerosis of the abdominal aorta with NEAR AORTIC OCCLUSION at the SMA level. Associated High-grade Stenosis of nearly all major aortic branch origins. And high-grade stenosis of the left Common iliac artery also due to bulky calcified plaque. Recommend Vascular Surgery Consultation. Aortic Atherosclerosis (ICD10-I70.0). 2. Superimposed mild inflammatory stranding in the right upper quadrant, near the porta hepatis  and hepatic flexure of colon. This might be a mild Acute Colitis - perhaps a low-grade ischemic colitis in light of #1, uncertain. 3. Trace bilateral pleural effusions, larger on the right. Cardiac enlargement since 2008, borderline cardiomegaly with trace pericardial fluid. 4. No other acute or inflammatory process identified in the abdomen or pelvis. Bilateral nephrolithiasis with developing staghorn calculus in the right lower pole. Chronic postinflammatory calcification of upper mesenteric lymph nodes, an area affected by acute pancreatitis in 2008. Electronically Signed: By: Genevie Ann M.D. On: 08/27/2021 10:55    Procedures Procedures   Medications Ordered in ED Medications - No data to display  ED Course  I have reviewed the triage vital signs and the nursing notes.  Pertinent labs & imaging results that were available during my care of the patient were reviewed by me and considered in my medical decision making (see chart for details).    MDM Rules/Calculators/A&P                           1 abdominal pain CT reviewed and discussed with Dr. Scot Dock who will see her for vascular surgery Dr. Scot Dock saw and evaluated patient.  He feels that this is an incidental finding is not causing patient's current symptoms.  He has arranged for outpatient rotted artery studies and given her follow-up regarding this. However she has had some nausea and vomiting and there is a  question of colitis.  Plan Cipro and Flagyl 2-anemia noted is stable from earlier this month.  Repeat Hemoccult ordered.  Patient advised that her hemoglobin is currently stable but will need to have this evaluated ongoing as outpatient 3 patient is very hypertensive-patient made queried regarding which medications that she is taking when she last took her medications. We discussed that she has had ongoing hypertensive.  Her blood pressure does tend to be elevated when she is in the ED.  She will take her home medications and have this rechecked by her primary care physician. Final Clinical Impression(s) / ED Diagnoses Final diagnoses:  Pain of upper abdomen  Colitis  Disease of the aorta [facility])    Rx / DC Orders ED Discharge Orders     None        Pattricia Boss, MD 08/27/21 1400    Pattricia Boss, MD 08/27/21 [phone]

## 2021-08-27 NOTE — ED Notes (Signed)
Pt d/c home per MD order. Discharge summary reviewed with pt, pt verbalizes understanding. No s/s of acute distress noted at discharge,. Ambulatory off unit. Reports son is discharge ride home.

## 2021-08-27 NOTE — Consult Note (Signed)
ASSESSMENT & PLAN   AORTIC ATHEROSCLEROSIS: This patient has an unusual finding on her CT scan.  She has a large bulky calcific plaque at the level of the celiac artery, superior mesenteric artery, and renal arteries.  There is however good flow into the mesenteric arteries and the IMA is patent.  She denies any postprandial abdominal pain or weight loss so I do not think she is having any symptoms from mesenteric ischemia.  She has some mild calf claudication but the symptoms have been present for years and are stable.  From my standpoint she does not need to be admitted.  We have discussed importance of tobacco cessation.  I also encouraged her to walk is much as possible.  If she subsequently developed symptoms related to mesenteric ischemia given the large bulky calcific plaque I suspect that she would not be a candidate for angioplasty and stenting.  The only surgical option could potentially be aortic endarterectomy which would require a thoracoabdominal approach.  If this were the case she would have to be referred to [facility].  I will plan on seeing her back in 6 months with follow-up ABIs.  BILATERAL CAROTID BRUITS: On exam she has bilateral carotid bruits.  She is asymptomatic.  I have ordered a carotid duplex scan.  REASON FOR CONSULT:    Aortic atherosclerosis.  The consult is requested by Dr. Pattricia Boss.  HPI:   Gwendolyn [name] is a 65 y.o. female who developed gradual onset of epigastric pain about 2 days ago which she describes as a bloating feeling.  This has improved.  She denies any postprandial abdominal pain prior to this.  She denies any weight loss.  She does describe bilateral calf claudication which is brought on by ambulation and relieved with rest.  This has been present for many years and has been stable.  She denies any history of stroke, TIAs, expressive or receptive aphasia, or amaurosis fugax.  Her risk factors for peripheral vascular disease include  hypertension, hypercholesterolemia, and tobacco use.  She smokes a pack per day cigarettes and has been smoking for many years.  She denies any history of diabetes or family history of premature cardiovascular disease.  Past Medical History:  Diagnosis Date   Anemia    Anxiety    Asthma    COPD (chronic obstructive pulmonary disease) (HCC)    Depression    GERD (gastroesophageal reflux disease)    History of kidney stones    Hypertension    Hypothyroidism     History reviewed. No pertinent family history.  SOCIAL HISTORY: Social History   Tobacco Use   Smoking status: Every Day    Packs/day: 1.00    Types: Cigarettes   Smokeless tobacco: Never  Substance Use Topics   Alcohol use: Never    Allergies  Allergen Reactions   Doxycycline Anaphylaxis   Biaxin [Clarithromycin] Other (See Comments)    Vertigo   Floxacillin [Flucloxacillin] Other (See Comments)    Vertigo   Morphine And Related Other (See Comments)    Headache    Current Facility-Administered Medications  Medication Dose Route Frequency Provider Last Rate Last Admin   labetalol (NORMODYNE) injection 20 mg  20 mg Intravenous Once Pattricia Boss, MD       Current Outpatient Medications  Medication Sig Dispense Refill   ciprofloxacin (CIPRO) 500 MG tablet Take 1 tablet (500 mg total) by mouth every 12 (twelve) hours. 10 tablet 0   metroNIDAZOLE (FLAGYL) 500 MG tablet Take  1 tablet (500 mg total) by mouth 2 (two) times daily. 14 tablet 0   acetaminophen (TYLENOL) 500 MG tablet Take 2 tablets (1,000 mg total) by mouth every 6 (six) hours as needed. 50 tablet 0   amLODipine (NORVASC) 10 MG tablet Take 10 mg by mouth daily.     Azilsartan-Chlorthalidone 40-25 MG TABS Take 1 tablet by mouth daily.     ciprofloxacin-dexamethasone (CIPRODEX) OTIC suspension Place 4 drops into the right ear 2 (two) times daily. 4 drops in the right ear twice daily after the tube is placed in the ear. 7.5 mL 0   Dexlansoprazole  (DEXILANT) 30 MG capsule Take 30 mg by mouth daily as needed (reflux).     escitalopram (LEXAPRO) 10 MG tablet Take 10 mg by mouth at bedtime.     levothyroxine (SYNTHROID) 137 MCG tablet Take 137 mcg by mouth daily before breakfast.     nebivolol (BYSTOLIC) 10 MG tablet Take 10 mg by mouth daily.     oxyCODONE-acetaminophen (PERCOCET) 5-325 MG tablet Take 1 tablet by mouth every 4 (four) hours as needed for severe pain. 10 tablet 0   triamcinolone (NASACORT) 55 MCG/ACT AERO nasal inhaler Place 2 sprays into the nose daily. 2 sprays each nostril at night 1 each 12    REVIEW OF SYSTEMS:  [X]  denotes positive finding, [ ]  denotes negative finding Cardiac  Comments:  Chest pain or chest pressure:    Shortness of breath upon exertion: x   Short of breath when lying flat: x   Irregular heart rhythm:        Vascular    Pain in calf, thigh, or hip brought on by ambulation: x   Pain in feet at night that wakes you up from your sleep:     Blood clot in your veins:    Leg swelling:         Pulmonary    Oxygen at home:    Productive cough:     Wheezing:         Neurologic    Sudden weakness in arms or legs:     Sudden numbness in arms or legs:     Sudden onset of difficulty speaking or slurred speech:    Temporary loss of vision in one eye:     Problems with dizziness:         Gastrointestinal    Blood in stool:     Vomited blood:         Genitourinary    Burning when urinating:     Blood in urine:        Psychiatric    Major depression:         Hematologic    Bleeding problems:    Problems with blood clotting too easily:        Skin    Rashes or ulcers:        Constitutional    Fever or chills:    -  PHYSICAL EXAM:   Vitals:   08/27/21 1131 08/27/21 1305 08/27/21 1345  BP: (!) 239/83 (!) 224/82 (!) 221/81  Pulse: (!) 113 94 100  Resp: 18 20 20   Temp: 99.1 F (37.3 C)    TempSrc: Oral    SpO2: 100% 98% 99%   There is no height or weight on file to calculate  BMI. GENERAL: The patient is a well-nourished female, in no acute distress. The vital signs are documented above. CARDIAC: There is a regular rate and rhythm.  VASCULAR: She has bilateral carotid bruits. On the right side, she has a palpable femoral pulse that is slightly diminished.  I cannot palpate a popliteal or pedal pulses.  She does have a barely biphasic dorsalis pedis and posterior tibial signal with the Doppler. On the left side, she has a palpable femoral pulse that is diminished.  I cannot palpate popliteal or pedal pulses.  She has a barely biphasic posterior tibial and dorsalis pedis signal with the Doppler. PULMONARY: There is good air exchange bilaterally without wheezing or rales. ABDOMEN: Soft and non-tender with normal pitched bowel sounds.  MUSCULOSKELETAL: There are no major deformities. NEUROLOGIC: No focal weakness or paresthesias are detected. SKIN: There are no ulcers or rashes noted. PSYCHIATRIC: The patient has a normal affect.   DATA:    LABS: I reviewed her labs from today.  Her creatinine is 0.7.  GFR is greater than 60.  White blood cell count 10.0.  Hemoglobin 8.7.  Platelets 302,000.  CT ANGIOGRAM: I reviewed the images of her CT angiogram.  She has severe atherosclerosis of the abdominal aorta.  She has significant calcific disease.  Although there is a large bulky calcific plaque at the level of the celiac SMA and renals, there is flow into the celiac and SMA.  The IMA is also patent.  There is mild inflammatory stranding in the right upper quadrant which could represent mild acute colitis.

## 2021-08-27 NOTE — ED Provider Notes (Signed)
Emergency Medicine Provider Triage Evaluation Note  GIANNY KILLMAN , a 65 y.o. female  was evaluated in triage.  Pt complains of abd pain x 2 days. Hx of smoking, HTN, HLD. Recent dx of CAP on Doxy. OP CT scan shows colitis and severe mesenteric artery disease  Review of Systems  Positive: Abd pain Negative: vomiting  Physical Exam  BP (!) 239/83 (BP Location: Right Arm)   Pulse (!) 113   Temp 99.1 F (37.3 C) (Oral)   Resp 18   LMP  (LMP Unknown)   SpO2 100%  Gen:   Awake, no distress   Resp:  Normal effort  MSK:   Moves extremities without difficulty  Other:  TTP RUQ  Medical Decision Making  Medically screening exam initiated at 11:39 AM.  Appropriate orders placed.  LYNNET HEFLEY was informed that the remainder of the evaluation will be completed by another provider, this initial triage assessment does not replace that evaluation, and the importance of remaining in the ED until their evaluation is complete.  Abd tenderness. Labs ordered.   Margarita Mail, PA-C 08/27/21 [place], [place], DO 08/27/21 1208

## 2021-09-06 ENCOUNTER — Ambulatory Visit: Payer: Medicare Other | Admitting: Cardiology

## 2021-09-06 ENCOUNTER — Other Ambulatory Visit: Payer: Self-pay

## 2021-09-06 ENCOUNTER — Encounter: Payer: Self-pay | Admitting: Cardiology

## 2021-09-06 VITALS — BP 178/66 | HR 82 | Temp 98.1°F | Resp 17 | Ht 59.5 in | Wt 169.0 lb

## 2021-09-06 DIAGNOSIS — I739 Peripheral vascular disease, unspecified: Secondary | ICD-10-CM

## 2021-09-06 DIAGNOSIS — I701 Atherosclerosis of renal artery: Secondary | ICD-10-CM

## 2021-09-06 DIAGNOSIS — Q251 Coarctation of aorta: Secondary | ICD-10-CM

## 2021-09-06 DIAGNOSIS — I10 Essential (primary) hypertension: Secondary | ICD-10-CM

## 2021-09-06 DIAGNOSIS — R0609 Other forms of dyspnea: Secondary | ICD-10-CM

## 2021-09-06 DIAGNOSIS — R0989 Other specified symptoms and signs involving the circulatory and respiratory systems: Secondary | ICD-10-CM

## 2021-09-06 MED ORDER — ISOSORBIDE DINITRATE 30 MG PO TABS: 30.0000 mg | ORAL_TABLET | Freq: Three times a day (TID) | ORAL | 2 refills | Status: DC

## 2021-09-06 MED ORDER — HYDRALAZINE HCL 50 MG PO TABS: 50.0000 mg | ORAL_TABLET | Freq: Three times a day (TID) | ORAL | 2 refills | Status: DC

## 2021-09-06 NOTE — Progress Notes (Signed)
Primary Physician/Referring:  Jilda Panda, MD  Patient ID: Gwendolyn [name], female    DOB: [birth year], 65 y.o.   MRN: [id number]  Chief Complaint  Patient presents with   New Patient (Initial Visit)   Follow-up   Claudication   HPI:    Gwendolyn [name]  is a 65 y.o. Caucasian female patient with longstanding history of hypertension, hyperlipidemia, tobacco use disorder, staghorn renal calculus, seen in the emergency room for abdominal discomfort on 08/27/2021 and CT of the abdomen and reviewed severe abdominal aortic stenosis and renal artery stenosis along with mild colitis.  She is now referred to me for evaluation of vascular disease.  She has bilateral thigh and also calf claudication with walking ongoing for several months and has remained stable.  Denies any ulcerations or rest pain.  She has chronic shortness of breath and dyspnea on exertion.  Fortunately she has not had any GI bleed, she remained stable otherwise.  She is tolerating statins, and in spite of multiple blood pressure medications, states that her blood pressure continues to remain high.  She is smoking about 1 pack of cigarettes a day.  Denies chest pain or palpitations.  Past Medical History:  Diagnosis Date   Anemia    Anxiety    Asthma    COPD (chronic obstructive pulmonary disease) (HCC)    Depression    GERD (gastroesophageal reflux disease)    History of kidney stones    Hypertension    Hypothyroidism    Past Surgical History:  Procedure Laterality Date   ABDOMINAL HYSTERECTOMY     Cesarean Section     (2)   CHOLECYSTECTOMY     COLONOSCOPY  2020   Gastric Ulcer  0240   UMBILICAL HERNIA REPAIR N/A 10/29/2020   Procedure: OPEN UMBILICAL HERNIA REPAIR WITH MESH;  Surgeon: Stechschulte, Nickola Major, MD;  Location: [facility];  Service: General;  Laterality: N/A;   Family History  Problem Relation Age of Onset   Colon cancer Mother    Diabetes Father    Diabetes Sister    Breast cancer Sister 40    Hypertension Sister     Social History   Tobacco Use   Smoking status: Every Day    Packs/day: 1.00    Years: 40.00    Pack years: 40.00    Types: Cigarettes   Smokeless tobacco: Never  Substance Use Topics   Alcohol use: Never   Marital Status: Widowed  ROS  Review of Systems  Cardiovascular:  Positive for claudication (bilateral thigh and calf). Negative for chest pain, dyspnea on exertion and leg swelling.  Respiratory:  Positive for cough.   Gastrointestinal:  Negative for melena.  Objective  Blood pressure (!) 178/66, pulse 82, temperature 98.1 F (36.7 C), temperature source Temporal, resp. rate 17, height 4' 11.5" (1.511 m), weight 169 lb (76.7 kg), SpO2 96 %. Body mass index is 33.56 kg/m.  Vitals with BMI 09/06/2021 09/06/2021 08/27/2021  Height - 4' 11.5" -  Weight - 169 lbs -  BMI - 97.35 -  Systolic 329 924 268  Diastolic 66 81 81  Pulse 82 86 100    Physical Exam Neck:     Vascular: Carotid bruit (bilateral) present. No JVD.  Cardiovascular:     Rate and Rhythm: Normal rate and regular rhythm.     Pulses: Intact distal pulses.          Radial pulses are 2+ on the right side and 2+ on the left  side.       Femoral pulses are 0 on the right side and 0 on the left side.      Popliteal pulses are 1+ on the right side and 1+ on the left side.       Dorsalis pedis pulses are 1+ on the right side and 1+ on the left side.       Posterior tibial pulses are 0 on the right side and 0 on the left side.     Heart sounds: Normal heart sounds. No murmur heard.   No gallop.  Pulmonary:     Effort: Pulmonary effort is normal.     Breath sounds: Normal breath sounds.  Abdominal:     General: Bowel sounds are normal.     Palpations: Abdomen is soft.  Musculoskeletal:     Right lower leg: No edema.     Left lower leg: No edema.  Skin:    Capillary Refill: Capillary refill takes less than 2 seconds.     Laboratory examination:   Recent Labs    08/10/21 1325  08/15/21 0937 08/27/21 1139  NA 142 137 136  K 3.9 4.4 3.6  CL 110 104 105  CO2 25 24 23   GLUCOSE 113* 100* 114*  BUN 12 21 8   CREATININE 0.66 0.91 0.70  CALCIUM 9.4 9.1 8.9  GFRNONAA >60 >60 >60   estimated creatinine clearance is 63.4 mL/min (by C-G formula based on SCr of 0.7 mg/dL).  CMP Latest Ref Rng & Units 08/27/2021 08/15/2021 08/10/2021  Glucose 70 - 99 mg/dL 114(H) 100(H) 113(H)  BUN 8 - 23 mg/dL 8 21 12   Creatinine 0.44 - 1.00 mg/dL 0.70 0.91 0.66  Sodium 135 - 145 mmol/L 136 137 142  Potassium 3.5 - 5.1 mmol/L 3.6 4.4 3.9  Chloride 98 - 111 mmol/L 105 104 110  CO2 22 - 32 mmol/L 23 24 25   Calcium 8.9 - 10.3 mg/dL 8.9 9.1 9.4  Total Protein 6.5 - 8.1 g/dL 6.5 - 7.6  Total Bilirubin 0.3 - 1.2 mg/dL 0.4 - 0.3  Alkaline Phos 38 - 126 U/L 60 - 60  AST 15 - 41 U/L 20 - 20  ALT 0 - 44 U/L 21 - 20   CBC Latest Ref Rng & Units 08/27/2021 08/15/2021 08/10/2021  WBC 4.0 - 10.5 K/uL 10.0 17.0(H) 9.6  Hemoglobin 12.0 - 15.0 g/dL 8.7(L) 8.6(L) 7.9(L)  Hematocrit 36.0 - 46.0 % 30.5(L) 29.4(L) 26.8(L)  Platelets 150 - 400 K/uL 302 385 309    Lipid Panel No results for input(s): CHOL, TRIG, LDLCALC, VLDL, HDL, CHOLHDL, LDLDIRECT in the last 8760 hours. Lipid Panel  No results found for: CHOL, TRIG, HDL, CHOLHDL, VLDL, LDLCALC, LDLDIRECT, LABVLDL   HEMOGLOBIN A1C No results found for: HGBA1C, MPG TSH Recent Labs    08/27/21 1351  TSH 0.706    External labs:   NA Medications and allergies   Allergies  Allergen Reactions   Doxycycline Anaphylaxis   Biaxin [Clarithromycin] Other (See Comments)    Vertigo   Floxacillin [Flucloxacillin] Other (See Comments)    Vertigo   Morphine And Related Other (See Comments)    Headache     Medication prior to this encounter:   Outpatient Medications Prior to Visit  Medication Sig Dispense Refill   acetaminophen (TYLENOL) 500 MG tablet Take 2 tablets (1,000 mg total) by mouth every 6 (six) hours as needed. 50 tablet 0    amLODipine (NORVASC) 5 MG tablet Take 10 mg by mouth  daily.     atorvastatin (LIPITOR) 80 MG tablet Take 80 mg by mouth daily.     Azilsartan Medoxomil (EDARBI) 80 MG TABS Take 1 tablet by mouth daily.     Iron-FA-B Cmp-C-Biot-Probiotic (FUSION PLUS) CAPS Take by mouth.     labetalol (NORMODYNE) 100 MG tablet Take 300 mg by mouth 2 (two) times daily. 300 MG IN THE AM 200 MG IN THE PM     levothyroxine (SYNTHROID) 150 MCG tablet Take 150 mcg by mouth daily before breakfast.     triamcinolone (NASACORT) 55 MCG/ACT AERO nasal inhaler Place 2 sprays into the nose daily. 2 sprays each nostril at night 1 each 12   amLODipine (NORVASC) 10 MG tablet Take 10 mg by mouth daily.     Azilsartan-Chlorthalidone 40-25 MG TABS Take 1 tablet by mouth daily.     ciprofloxacin (CIPRO) 500 MG tablet Take 1 tablet (500 mg total) by mouth every 12 (twelve) hours. 10 tablet 0   ciprofloxacin-dexamethasone (CIPRODEX) OTIC suspension Place 4 drops into the right ear 2 (two) times daily. 4 drops in the right ear twice daily after the tube is placed in the ear. 7.5 mL 0   Dexlansoprazole (DEXILANT) 30 MG capsule Take 30 mg by mouth daily as needed (reflux).     escitalopram (LEXAPRO) 10 MG tablet Take 10 mg by mouth at bedtime.     levothyroxine (SYNTHROID) 137 MCG tablet Take 137 mcg by mouth daily before breakfast.     metroNIDAZOLE (FLAGYL) 500 MG tablet Take 1 tablet (500 mg total) by mouth 2 (two) times daily. 14 tablet 0   nebivolol (BYSTOLIC) 10 MG tablet Take 10 mg by mouth daily.     oxyCODONE-acetaminophen (PERCOCET) 5-325 MG tablet Take 1 tablet by mouth every 4 (four) hours as needed for severe pain. 10 tablet 0   No facility-administered medications prior to visit.     Medication list after today's encounter   Current Outpatient Medications  Medication Instructions   acetaminophen (TYLENOL) 1,000 mg, Oral, Every 6 hours PRN   amLODipine (NORVASC) 10 mg, Oral, Daily   atorvastatin (LIPITOR) 80 mg, Oral,  Daily   Azilsartan Medoxomil (EDARBI) 80 MG TABS 1 tablet, Oral, Daily   hydrALAZINE (APRESOLINE) 50 mg, Oral, 3 times daily   Iron-FA-B Cmp-C-Biot-Probiotic (FUSION PLUS) CAPS Oral   isosorbide dinitrate (ISORDIL) 30 mg, Oral, 3 times daily   labetalol (NORMODYNE) 300 mg, Oral, 2 times daily, 300 MG IN THE AM 200 MG IN THE PM   levothyroxine (SYNTHROID) 150 mcg, Oral, Daily before breakfast   triamcinolone (NASACORT) 55 MCG/ACT AERO nasal inhaler 2 sprays, Nasal, Daily, 2 sprays each nostril at night    Radiology:   CT of the abdomen and pelvis 08/27/2021: 1. Very severe calcified atherosclerosis of the abdominal aorta with NEAR AORTIC OCCLUSION at the SMA level. Associated High-grade Stenosis of nearly all major aortic branch origins. And high-grade stenosis of the left Common iliac artery also due to bulky calcified plaque. Recommend Vascular Surgery Consultation. Aortic Atherosclerosis (ICD10-I70.0).   2. Superimposed mild inflammatory stranding in the right upper quadrant, near the porta hepatis and hepatic flexure of colon. This might be a mild Acute Colitis - perhaps a low-grade ischemic colitis in light of #1, uncertain.   3. Trace bilateral pleural effusions, larger on the right. Cardiac enlargement since 2008, borderline cardiomegaly with trace pericardial fluid.   4. No other acute or inflammatory process identified in the abdomen or pelvis. Bilateral nephrolithiasis with developing staghorn  calculus in the right lower pole. Chronic postinflammatory calcification of upper mesenteric lymph nodes, an area affected by acute pancreatitis in 2008.  Cardiac Studies:   NA  EKG:   EKG 09/06/2021: Normal sinus rhythm at rate of 81 bpm, left atrial enlargement, poor R wave progression, cannot exclude anteroseptal infarct old.  No evidence of ischemia.    Assessment     ICD-10-CM   1. Abdominal aortic stenosis  Q25.1     2. Claudication in peripheral vascular disease  (HCC)  I73.9 PCV LOWER ARTERIAL (BILATERAL)    3. Resistant hypertension  I10 EKG 12-Lead    hydrALAZINE (APRESOLINE) 50 MG tablet    isosorbide dinitrate (ISORDIL) 30 MG tablet    4. Dyspnea on exertion  R06.09 PCV ECHOCARDIOGRAM COMPLETE    PCV MYOCARDIAL PERFUSION WITH LEXISCAN    5. Bilateral carotid bruits  R09.89 PCV CAROTID DUPLEX (BILATERAL)    6. Renal artery stenosis (HCC)  I70.1        Medications Discontinued During This Encounter  Medication Reason   amLODipine (NORVASC) 10 MG tablet Error   Azilsartan-Chlorthalidone 40-25 MG TABS Error   ciprofloxacin (CIPRO) 500 MG tablet Error   ciprofloxacin-dexamethasone (CIPRODEX) OTIC suspension Error   Dexlansoprazole (DEXILANT) 30 MG capsule Error   escitalopram (LEXAPRO) 10 MG tablet Error   levothyroxine (SYNTHROID) 137 MCG tablet Error   metroNIDAZOLE (FLAGYL) 500 MG tablet Error   nebivolol (BYSTOLIC) 10 MG tablet Error   oxyCODONE-acetaminophen (PERCOCET) 5-325 MG tablet Error    Meds ordered this encounter  Medications   hydrALAZINE (APRESOLINE) 50 MG tablet    Sig: Take 1 tablet (50 mg total) by mouth 3 (three) times daily.    Dispense:  90 tablet    Refill:  2   isosorbide dinitrate (ISORDIL) 30 MG tablet    Sig: Take 1 tablet (30 mg total) by mouth 3 (three) times daily.    Dispense:  90 tablet    Refill:  2    Orders Placed This Encounter  Procedures   PCV MYOCARDIAL PERFUSION WITH LEXISCAN    Standing Status:   Future    Standing Expiration Date:   11/06/2021   EKG 12-Lead   PCV ECHOCARDIOGRAM COMPLETE    Standing Status:   Future    Standing Expiration Date:   09/06/2022   Recommendations:   RIVA SESMA is a 65 y.o. Caucasian female patient with longstanding history of hypertension, hyperlipidemia, tobacco use disorder, staghorn renal calculus, seen in the emergency room for abdominal discomfort on 08/27/2021 and CT of the abdomen and reviewed severe abdominal aortic stenosis and renal  artery stenosis along with mild colitis.  She is now referred to me for evaluation of vascular disease.  Physical examination reveals bilateral carotid bruit and very feeble pedal pulses and absent femoral pulses.  Fortunately there is no limb threatening ischemia, and in spite of severe aortic stenosis in the abdomen, she still has faint pedal pulses in the form of 1+ DP.  She has chronic dyspnea on exertion, probably related to underlying COPD and ongoing tobacco use disorder.  However underlying CAD needs to be excluded.  Smoking cessation was extensively discussed with the patient.  Obtain Lower extremity arterial duplex/ABI for evaluation of PAD and or claudication. Schedule for carotid duplex for bruit/follow-up surveillance of carotid stenosis. Schedule for a Lexiscan Nuclear stress test to evaluate for myocardial ischemia. Patient unable to do treadmill stress testing due to dyspnea. Will schedule for an echocardiogram.   She  has resistant hypertension, suspect bilateral renal artery stenosis also contributing to this.  I will add isosorbide dinitrate 30 mg 3 times daily along with hydralazine 50 mg 3 times daily.  She may benefit from abdominal aortogram and evaluation of PAD at some point.    Adrian Prows, MD, [facility] 09/06/2021, 1:36 PM Office: [phone]

## 2021-09-09 ENCOUNTER — Ambulatory Visit: Payer: Medicare Other

## 2021-09-09 ENCOUNTER — Other Ambulatory Visit: Payer: Self-pay

## 2021-09-09 DIAGNOSIS — R0989 Other specified symptoms and signs involving the circulatory and respiratory systems: Secondary | ICD-10-CM

## 2021-09-09 DIAGNOSIS — R0609 Other forms of dyspnea: Secondary | ICD-10-CM

## 2021-09-12 NOTE — Progress Notes (Signed)
Carotid artery duplex 09/07/2021:  Duplex suggests stenosis in the right internal carotid artery (1-15%). Duplex suggests stenosis in the right external carotid artery (<50%). Duplex suggests stenosis in the left internal carotid artery (50-69%). Duplex suggests stenosis in the left external carotid artery (<50%). Antegrade right vertebral artery flow. Antegrade left vertebral artery flow. Follow up in six months is appropriate if clinically indicated.

## 2021-09-16 ENCOUNTER — Ambulatory Visit
Admission: RE | Admit: 2021-09-16 | Discharge: 2021-09-16 | Disposition: A | Discharge status: Home / Self Care | Payer: Medicare Other | Source: Ambulatory Visit | Attending: Internal Medicine | Admitting: Internal Medicine

## 2021-09-16 ENCOUNTER — Other Ambulatory Visit: Payer: Medicare Other

## 2021-09-16 ENCOUNTER — Other Ambulatory Visit: Payer: Self-pay

## 2021-09-16 ENCOUNTER — Other Ambulatory Visit: Payer: Self-pay | Admitting: Internal Medicine

## 2021-09-16 DIAGNOSIS — J189 Pneumonia, unspecified organism: Secondary | ICD-10-CM

## 2021-10-20 ENCOUNTER — Other Ambulatory Visit: Payer: Medicare Other

## 2021-10-21 ENCOUNTER — Other Ambulatory Visit: Payer: Medicare Other

## 2021-11-02 ENCOUNTER — Ambulatory Visit: Payer: Medicare Other | Admitting: Cardiology

## 2021-12-05 ENCOUNTER — Other Ambulatory Visit: Payer: Self-pay | Admitting: Cardiology

## 2021-12-05 DIAGNOSIS — I10 Essential (primary) hypertension: Secondary | ICD-10-CM

## 2021-12-17 NOTE — Telephone Encounter (Signed)
From patient.

## 2021-12-30 ENCOUNTER — Other Ambulatory Visit: Payer: Medicare Other

## 2022-01-06 ENCOUNTER — Ambulatory Visit: Payer: Medicare Other | Admitting: Cardiology

## 2022-01-30 ENCOUNTER — Encounter: Payer: Self-pay | Admitting: Cardiology

## 2022-01-30 DIAGNOSIS — R0609 Other forms of dyspnea: Secondary | ICD-10-CM

## 2022-01-30 DIAGNOSIS — I5031 Acute diastolic (congestive) heart failure: Secondary | ICD-10-CM

## 2022-01-30 MED ORDER — FUROSEMIDE 20 MG PO TABS: 20.0000 mg | ORAL_TABLET | Freq: Every day | ORAL | 2 refills | Status: DC | PRN

## 2022-01-30 MED ORDER — POTASSIUM CHLORIDE ER 10 MEQ PO TBCR: 10.0000 meq | EXTENDED_RELEASE_TABLET | Freq: Every day | ORAL | 2 refills | Status: DC | PRN

## 2022-01-30 NOTE — Telephone Encounter (Signed)
ICD-10-CM   ?1. Dyspnea on exertion  R06.09 PCV ECHOCARDIOGRAM COMPLETE  ?  furosemide (LASIX) 20 MG tablet  ?  potassium chloride (KLOR-CON) 10 MEQ tablet  ?  ?2. Acute diastolic heart failure (HCC)  I50.31 furosemide (LASIX) 20 MG tablet  ?  potassium chloride (KLOR-CON) 10 MEQ tablet  ?  ? ?Meds ordered this encounter  ?Medications  ? furosemide (LASIX) 20 MG tablet  ?  Sig: Take 1 tablet (20 mg total) by mouth daily as needed for fluid (shortness of breath annd 2 Lbs wieght gain).  ?  Dispense:  30 tablet  ?  Refill:  2  ? potassium chloride (KLOR-CON) 10 MEQ tablet  ?  Sig: Take 1 tablet (10 mEq total) by mouth daily as needed. With Furosemide only  ?  Dispense:  30 tablet  ?  Refill:  2  ?  ?

## 2022-01-30 NOTE — Telephone Encounter (Signed)
Received fax from Dr. Mellody Drown, markedly elevated BNP. ? ?BMP 01/25/2022: 1697. ? ?Patient needs urgent office visit either with me or with [facility], she also needs an echocardiogram, office visit can be before or after the echocardiogram but needs to be seen urgently within a day or 2. ?

## 2022-01-31 ENCOUNTER — Ambulatory Visit: Payer: Medicare Other | Admitting: Student

## 2022-01-31 ENCOUNTER — Other Ambulatory Visit: Payer: Self-pay

## 2022-01-31 ENCOUNTER — Encounter: Payer: Self-pay | Admitting: Student

## 2022-01-31 VITALS — BP 203/76 | HR 102 | Temp 98.6°F | Resp 16 | Ht 59.5 in | Wt 141.2 lb

## 2022-01-31 DIAGNOSIS — I739 Peripheral vascular disease, unspecified: Secondary | ICD-10-CM

## 2022-01-31 DIAGNOSIS — R0609 Other forms of dyspnea: Secondary | ICD-10-CM

## 2022-01-31 DIAGNOSIS — I10 Essential (primary) hypertension: Secondary | ICD-10-CM

## 2022-01-31 DIAGNOSIS — I5031 Acute diastolic (congestive) heart failure: Secondary | ICD-10-CM

## 2022-01-31 MED ORDER — LABETALOL HCL 200 MG PO TABS: 200.0000 mg | ORAL_TABLET | Freq: Two times a day (BID) | ORAL | 3 refills | Status: DC

## 2022-01-31 MED ORDER — HYDRALAZINE HCL 50 MG PO TABS: ORAL_TABLET | ORAL | 3 refills | Status: DC

## 2022-01-31 NOTE — Progress Notes (Signed)
? ?Primary Physician/Referring:  Jilda Panda, MD ? ?Patient ID: Gwendolyn [name], female    DOB: 11-03-1956, 66 y.o.   MRN: [id number] ? ?Chief Complaint  ?Patient presents with  ? Follow-up  ? doe  ? Shortness of Breath  ? ?HPI:   ? ?Gwendolyn [name]  is a 66 y.o. Caucasian female patient with longstanding history of hypertension, hyperlipidemia, tobacco use disorder, staghorn renal calculus, seen in the emergency room for abdominal discomfort on 08/27/2021 and CT of the abdomen and reviewed severe abdominal aortic stenosis and renal artery stenosis along with mild colitis.  Patient was originally referred to our office for evaluation of vascular disease. ? ?She was last seen by Dr. Einar Gip 09/06/2021 at which time ordered lower arterial duplex, echocardiogram, stress test, and carotid artery duplex.  Also at last office visit started patient on isosorbide dinitrate and hydralazine.  Patient was advised to follow-up in 6 weeks, unfortunately she was lost to follow-up until now.  Our office recently received labs from patient's PCP with elevated BNP at 1697.  Patient reported symptoms of shortness of breath and orthopnea.  She was advised by Dr. Einar Gip to take Lasix 20 mg p.o. daily with potassium and make and urgent office visit.  ? ?Patient now presents for urgent office visit.  Patient reports she has had pneumonia in November and was recently treated for sinus infection with Z-Pak by PCP.  However over the last 2 weeks she has noticed worsening dyspnea on exertion as well as orthopnea.  Denies leg swelling, chest pain, syncope, near syncope.  Unfortunately continues to smoke about a pack per day.  Following last office visit patient tried hydralazine and isosorbide dinitrate as prescribed for about 4 days, she then stopped this due to headache.  She was also recently switched to olmesartan by PCP approximately 1 month ago.  Currently taking labetalol 100 mg once daily as well is Lasix 20 mg p.o.  daily. ? ?Patient's blood pressure is uncontrolled in the office today.  She denies symptoms of TIA or CVA. ? ?Past Medical History:  ?Diagnosis Date  ? Anemia   ? Anxiety   ? Asthma   ? COPD (chronic obstructive pulmonary disease) ([place])   ? Depression   ? GERD (gastroesophageal reflux disease)   ? History of kidney stones   ? Hypertension   ? Hypothyroidism   ? ?Past Surgical History:  ?Procedure Laterality Date  ? ABDOMINAL HYSTERECTOMY    ? Cesarean Section    ? (2)  ? CHOLECYSTECTOMY    ? COLONOSCOPY  2020  ? Gastric Ulcer  2020  ? UMBILICAL HERNIA REPAIR N/A 10/29/2020  ? Procedure: OPEN UMBILICAL HERNIA REPAIR WITH MESH;  Surgeon: Stechschulte, Nickola Major, MD;  Location: [place];  Service: General;  Laterality: N/A;  ? ?Family History  ?Problem Relation Age of Onset  ? Colon cancer Mother   ? Diabetes Father   ? Diabetes Sister   ? Breast cancer Sister 25  ? Hypertension Sister   ?  ?Social History  ? ?Tobacco Use  ? Smoking status: Every Day  ?  Packs/day: 1.00  ?  Years: 40.00  ?  Pack years: 40.00  ?  Types: Cigarettes  ? Smokeless tobacco: Never  ?Substance Use Topics  ? Alcohol use: Never  ? ?Marital Status: Widowed  ?ROS  ?Review of Systems  ?Cardiovascular:  Positive for claudication (bilateral thigh and calf) and orthopnea. Negative for chest pain, leg swelling, near-syncope, paroxysmal nocturnal dyspnea and syncope.  ?  Respiratory:  Positive for cough and shortness of breath.   ?Gastrointestinal:  Negative for melena.  ? ?Objective  ?Blood pressure (!) 203/76, pulse (!) 102, temperature 98.6 ?F (37 ?C), temperature source Temporal, resp. rate 16, height 4' 11.5" (1.511 m), weight 141 lb 3.2 oz (64 kg), SpO2 95 %. Body mass index is 28.04 kg/m?.  ? ?  01/31/2022  ?  2:25 PM 01/31/2022  ?  2:04 PM 01/31/2022  ?  1:58 PM  ?Vitals with BMI  ?Height   4' 11.5"  ?Weight   141 lbs 3 oz  ?BMI   28.05  ?Systolic 956 387 564  ?Diastolic 76 85 81  ?Pulse 102 111 108  ?  ?Physical Exam ?Vitals reviewed.  ?Constitutional:    ?   Appearance: She is obese.  ?Neck:  ?   Vascular: Carotid bruit (bilateral) and JVD present.  ?Cardiovascular:  ?   Rate and Rhythm: Regular rhythm. Tachycardia present.  ?   Pulses: Intact distal pulses.     ?     Radial pulses are 2+ on the right side and 2+ on the left side.  ?     Femoral pulses are 0 on the right side and 0 on the left side. ?     Popliteal pulses are 1+ on the right side and 1+ on the left side.  ?     Dorsalis pedis pulses are 1+ on the right side and 1+ on the left side.  ?     Posterior tibial pulses are 0 on the right side and 0 on the left side.  ?   Heart sounds: Normal heart sounds. No murmur heard. ?  No gallop.  ?Pulmonary:  ?   Effort: Pulmonary effort is normal.  ?   Breath sounds: Normal breath sounds.  ?Musculoskeletal:  ?   Right lower leg: No edema.  ?   Left lower leg: No edema.  ?Skin: ?   Capillary Refill: Capillary refill takes less than 2 seconds.  ?  ? ?Laboratory examination:  ? ?Recent Labs  ?  08/10/21 ?1325 08/15/21 ?3329 08/27/21 ?1139  ?NA 142 137 136  ?K 3.9 4.4 3.6  ?CL 110 104 105  ?CO2 '25 24 23  '$ ?GLUCOSE 113* 100* 114*  ?BUN '12 21 8  '$ ?CREATININE 0.66 0.91 0.70  ?CALCIUM 9.4 9.1 8.9  ?GFRNONAA >60 >60 >60  ? ?CrCl cannot be calculated (Patient's most recent lab result is older than the maximum 21 days allowed.).  ? ?  Latest Ref Rng & Units 08/27/2021  ? 11:39 AM 08/15/2021  ?  9:37 AM 08/10/2021  ?  1:25 PM  ?CMP  ?Glucose 70 - 99 mg/dL 114   100   113    ?BUN 8 - 23 mg/dL '8   21   12    '$ ?Creatinine 0.44 - 1.00 mg/dL 0.70   0.91   0.66    ?Sodium 135 - 145 mmol/L 136   137   142    ?Potassium 3.5 - 5.1 mmol/L 3.6   4.4   3.9    ?Chloride 98 - 111 mmol/L 105   104   110    ?CO2 22 - 32 mmol/L '23   24   25    '$ ?Calcium 8.9 - 10.3 mg/dL 8.9   9.1   9.4    ?Total Protein 6.5 - 8.1 g/dL 6.5    7.6    ?Total Bilirubin 0.3 - 1.2 mg/dL 0.4  0.3    ?Alkaline Phos 38 - 126 U/L 60    60    ?AST 15 - 41 U/L 20    20    ?ALT 0 - 44 U/L 21    20    ? ? ?  Latest Ref Rng &  Units 08/27/2021  ? 11:39 AM 08/15/2021  ?  9:37 AM 08/10/2021  ?  1:25 PM  ?CBC  ?WBC 4.0 - 10.5 K/uL 10.0   17.0   9.6    ?Hemoglobin 12.0 - 15.0 g/dL 8.7   8.6   7.9    ?Hematocrit 36.0 - 46.0 % 30.5   29.4   26.8    ?Platelets 150 - 400 K/uL 302   385   309    ? ? ?Lipid Panel ?No results for input(s): CHOL, TRIG, LDLCALC, VLDL, HDL, CHOLHDL, LDLDIRECT in the last 8760 hours. ?Lipid Panel  ?No results found for: CHOL, TRIG, HDL, CHOLHDL, VLDL, LDLCALC, LDLDIRECT, LABVLDL  ? ?HEMOGLOBIN A1C ?No results found for: HGBA1C, MPG ?TSH ?Recent Labs  ?  08/27/21 ?1351  ?TSH 0.706  ? ? ?External labs:  ?01/25/2022: BNP 1697 ? ?Medications and allergies  ? ?Allergies  ?Allergen Reactions  ? Doxycycline Anaphylaxis  ? Azilsartan   ?  Other reaction(s): cough  ? Biaxin [Clarithromycin] Other (See Comments)  ?  Vertigo  ? Floxacillin [Floxacillin (Flucloxacillin)] Other (See Comments)  ?  Vertigo  ? Morphine And Related Other (See Comments)  ?  Headache  ?  ?Medication prior to this encounter:  ? ?Outpatient Medications Prior to Visit  ?Medication Sig Dispense Refill  ? amLODipine (NORVASC) 5 MG tablet Take 10 mg by mouth daily.    ? atorvastatin (LIPITOR) 80 MG tablet Take 80 mg by mouth daily.    ? Budeson-Glycopyrrol-Formoterol (BREZTRI AEROSPHERE) 160-9-4.8 MCG/ACT AERO Inhale into the lungs.    ? furosemide (LASIX) 20 MG tablet Take 1 tablet (20 mg total) by mouth daily as needed for fluid (shortness of breath annd 2 Lbs wieght gain). 30 tablet 2  ? Iron-FA-B Cmp-C-Biot-Probiotic (FUSION PLUS) CAPS Take by mouth.    ? levothyroxine (SYNTHROID) 150 MCG tablet Take 137 mcg by mouth daily before breakfast.    ? olmesartan (BENICAR) 40 MG tablet Take 40 mg by mouth daily.    ? labetalol (NORMODYNE) 100 MG tablet Take 300 mg by mouth 2 (two) times daily. 300 MG IN THE AM 200 MG IN THE PM    ? potassium chloride (KLOR-CON) 10 MEQ tablet Take 1 tablet (10 mEq total) by mouth daily as needed. With Furosemide only (Patient not  taking: Reported on 01/31/2022) 30 tablet 2  ? Azilsartan Medoxomil (EDARBI) 80 MG TABS Take 1 tablet by mouth daily.    ? hydrALAZINE (APRESOLINE) 50 MG tablet TAKE 1 TABLET(50 MG) BY MOUTH THREE TIMES DAILY 90 tablet 2

## 2022-02-01 ENCOUNTER — Other Ambulatory Visit: Payer: Medicare Other

## 2022-02-01 ENCOUNTER — Emergency Department (HOSPITAL_COMMUNITY): Payer: Medicare Other

## 2022-02-01 ENCOUNTER — Other Ambulatory Visit: Payer: Self-pay

## 2022-02-01 ENCOUNTER — Inpatient Hospital Stay (HOSPITAL_COMMUNITY): Payer: Medicare Other

## 2022-02-01 ENCOUNTER — Inpatient Hospital Stay (HOSPITAL_COMMUNITY)
Admission: EM | Admit: 2022-02-01 | Discharge: 2022-02-07 | DRG: 673 | Disposition: A | Discharge status: Home / Self Care | Payer: Medicare Other | Attending: Internal Medicine | Admitting: Internal Medicine

## 2022-02-01 DIAGNOSIS — Z20822 Contact with and (suspected) exposure to covid-19: Secondary | ICD-10-CM | POA: Diagnosis present

## 2022-02-01 DIAGNOSIS — I509 Heart failure, unspecified: Secondary | ICD-10-CM

## 2022-02-01 DIAGNOSIS — I5033 Acute on chronic diastolic (congestive) heart failure: Secondary | ICD-10-CM | POA: Diagnosis present

## 2022-02-01 DIAGNOSIS — I110 Hypertensive heart disease with heart failure: Secondary | ICD-10-CM | POA: Diagnosis present

## 2022-02-01 DIAGNOSIS — N170 Acute kidney failure with tubular necrosis: Secondary | ICD-10-CM | POA: Diagnosis present

## 2022-02-01 DIAGNOSIS — I161 Hypertensive emergency: Secondary | ICD-10-CM | POA: Diagnosis present

## 2022-02-01 DIAGNOSIS — I739 Peripheral vascular disease, unspecified: Secondary | ICD-10-CM | POA: Diagnosis present

## 2022-02-01 DIAGNOSIS — E872 Acidosis, unspecified: Secondary | ICD-10-CM | POA: Diagnosis present

## 2022-02-01 DIAGNOSIS — Z79890 Hormone replacement therapy (postmenopausal): Secondary | ICD-10-CM | POA: Diagnosis not present

## 2022-02-01 DIAGNOSIS — Q251 Coarctation of aorta: Secondary | ICD-10-CM

## 2022-02-01 DIAGNOSIS — E782 Mixed hyperlipidemia: Secondary | ICD-10-CM | POA: Diagnosis present

## 2022-02-01 DIAGNOSIS — N179 Acute kidney failure, unspecified: Secondary | ICD-10-CM | POA: Diagnosis not present

## 2022-02-01 DIAGNOSIS — Z79899 Other long term (current) drug therapy: Secondary | ICD-10-CM | POA: Diagnosis not present

## 2022-02-01 DIAGNOSIS — F17210 Nicotine dependence, cigarettes, uncomplicated: Secondary | ICD-10-CM | POA: Diagnosis present

## 2022-02-01 DIAGNOSIS — Z800 Family history of malignant neoplasm of digestive organs: Secondary | ICD-10-CM | POA: Diagnosis not present

## 2022-02-01 DIAGNOSIS — Z90710 Acquired absence of both cervix and uterus: Secondary | ICD-10-CM

## 2022-02-01 DIAGNOSIS — Z8249 Family history of ischemic heart disease and other diseases of the circulatory system: Secondary | ICD-10-CM

## 2022-02-01 DIAGNOSIS — R0603 Acute respiratory distress: Secondary | ICD-10-CM

## 2022-02-01 DIAGNOSIS — Z833 Family history of diabetes mellitus: Secondary | ICD-10-CM | POA: Diagnosis not present

## 2022-02-01 DIAGNOSIS — J441 Chronic obstructive pulmonary disease with (acute) exacerbation: Secondary | ICD-10-CM | POA: Diagnosis present

## 2022-02-01 DIAGNOSIS — I2781 Cor pulmonale (chronic): Secondary | ICD-10-CM | POA: Diagnosis present

## 2022-02-01 DIAGNOSIS — E039 Hypothyroidism, unspecified: Secondary | ICD-10-CM | POA: Diagnosis present

## 2022-02-01 DIAGNOSIS — Z7951 Long term (current) use of inhaled steroids: Secondary | ICD-10-CM

## 2022-02-01 DIAGNOSIS — I701 Atherosclerosis of renal artery: Secondary | ICD-10-CM | POA: Diagnosis present

## 2022-02-01 DIAGNOSIS — I160 Hypertensive urgency: Secondary | ICD-10-CM | POA: Diagnosis not present

## 2022-02-01 DIAGNOSIS — Z803 Family history of malignant neoplasm of breast: Secondary | ICD-10-CM

## 2022-02-01 DIAGNOSIS — D638 Anemia in other chronic diseases classified elsewhere: Secondary | ICD-10-CM | POA: Diagnosis present

## 2022-02-01 DIAGNOSIS — D509 Iron deficiency anemia, unspecified: Secondary | ICD-10-CM | POA: Diagnosis present

## 2022-02-01 DIAGNOSIS — R0609 Other forms of dyspnea: Secondary | ICD-10-CM

## 2022-02-01 DIAGNOSIS — E871 Hypo-osmolality and hyponatremia: Secondary | ICD-10-CM | POA: Diagnosis present

## 2022-02-01 DIAGNOSIS — F32A Depression, unspecified: Secondary | ICD-10-CM | POA: Diagnosis present

## 2022-02-01 DIAGNOSIS — J449 Chronic obstructive pulmonary disease, unspecified: Secondary | ICD-10-CM

## 2022-02-01 DIAGNOSIS — J9601 Acute respiratory failure with hypoxia: Secondary | ICD-10-CM | POA: Diagnosis present

## 2022-02-01 DIAGNOSIS — Z8679 Personal history of other diseases of the circulatory system: Secondary | ICD-10-CM

## 2022-02-01 DIAGNOSIS — K219 Gastro-esophageal reflux disease without esophagitis: Secondary | ICD-10-CM | POA: Diagnosis present

## 2022-02-01 LAB — CBC WITH DIFFERENTIAL/PLATELET
Abs Immature Granulocytes: 0.14 10*3/uL — ABNORMAL HIGH (ref 0.00–0.07)
Basophils Absolute: 0.2 10*3/uL — ABNORMAL HIGH (ref 0.0–0.1)
Basophils Relative: 1 %
Eosinophils Absolute: 0.1 10*3/uL (ref 0.0–0.5)
Eosinophils Relative: 1 %
HCT: 35.4 % — ABNORMAL LOW (ref 36.0–46.0)
Hemoglobin: 11.3 g/dL — ABNORMAL LOW (ref 12.0–15.0)
Immature Granulocytes: 1 %
Lymphocytes Relative: 7 %
Lymphs Abs: 1.4 10*3/uL (ref 0.7–4.0)
MCH: 26.3 pg (ref 26.0–34.0)
MCHC: 31.9 g/dL (ref 30.0–36.0)
MCV: 82.5 fL (ref 80.0–100.0)
Monocytes Absolute: 1.4 10*3/uL — ABNORMAL HIGH (ref 0.1–1.0)
Monocytes Relative: 7 %
Neutro Abs: 18 10*3/uL — ABNORMAL HIGH (ref 1.7–7.7)
Neutrophils Relative %: 83 %
Platelets: 242 10*3/uL (ref 150–400)
RBC: 4.29 MIL/uL (ref 3.87–5.11)
RDW: 16.9 % — ABNORMAL HIGH (ref 11.5–15.5)
WBC: 21.2 10*3/uL — ABNORMAL HIGH (ref 4.0–10.5)
nRBC: 0 % (ref 0.0–0.2)

## 2022-02-01 LAB — COMPREHENSIVE METABOLIC PANEL
ALT: 17 U/L (ref 0–44)
AST: 21 U/L (ref 15–41)
Albumin: 4.2 g/dL (ref 3.5–5.0)
Alkaline Phosphatase: 71 U/L (ref 38–126)
Anion gap: 9 (ref 5–15)
BUN: 25 mg/dL — ABNORMAL HIGH (ref 8–23)
CO2: 21 mmol/L — ABNORMAL LOW (ref 22–32)
Calcium: 9.2 mg/dL (ref 8.9–10.3)
Chloride: 105 mmol/L (ref 98–111)
Creatinine, Ser: 1.99 mg/dL — ABNORMAL HIGH (ref 0.44–1.00)
GFR, Estimated: 27 mL/min — ABNORMAL LOW (ref >60)
Glucose, Bld: 159 mg/dL — ABNORMAL HIGH (ref 70–99)
Potassium: 4.4 mmol/L (ref 3.5–5.1)
Sodium: 135 mmol/L (ref 135–145)
Total Bilirubin: 0.6 mg/dL (ref 0.3–1.2)
Total Protein: 6.8 g/dL (ref 6.5–8.1)

## 2022-02-01 LAB — RESP PANEL BY RT-PCR (FLU A&B, COVID) ARPGX2
Influenza A by PCR: NEGATIVE
Influenza B by PCR: NEGATIVE
SARS Coronavirus 2 by RT PCR: NEGATIVE

## 2022-02-01 LAB — I-STAT VENOUS BLOOD GAS, ED
Acid-base deficit: 4 mmol/L — ABNORMAL HIGH (ref 0.0–2.0)
Bicarbonate: 21.9 mmol/L (ref 20.0–28.0)
Calcium, Ion: 1.22 mmol/L (ref 1.15–1.40)
HCT: 32 % — ABNORMAL LOW (ref 36.0–46.0)
Hemoglobin: 10.9 g/dL — ABNORMAL LOW (ref 12.0–15.0)
O2 Saturation: 99 %
Potassium: 4.6 mmol/L (ref 3.5–5.1)
Sodium: 137 mmol/L (ref 135–145)
TCO2: 23 mmol/L (ref 22–32)
pCO2, Ven: 40.4 mmHg — ABNORMAL LOW (ref 44–60)
pH, Ven: 7.342 (ref 7.25–7.43)
pO2, Ven: 123 mmHg — ABNORMAL HIGH (ref 32–45)

## 2022-02-01 LAB — BRAIN NATRIURETIC PEPTIDE: B Natriuretic Peptide: 1788.9 pg/mL — ABNORMAL HIGH (ref 0.0–100.0)

## 2022-02-01 LAB — TSH: TSH: 0.225 u[IU]/mL — ABNORMAL LOW (ref 0.350–4.500)

## 2022-02-01 LAB — TROPONIN I (HIGH SENSITIVITY)
Troponin I (High Sensitivity): 37 ng/L — ABNORMAL HIGH (ref <18)
Troponin I (High Sensitivity): 45 ng/L — ABNORMAL HIGH (ref <18)

## 2022-02-01 LAB — T4, FREE: Free T4: 1.34 ng/dL — ABNORMAL HIGH (ref 0.61–1.12)

## 2022-02-01 LAB — HIV ANTIBODY (ROUTINE TESTING W REFLEX): HIV Screen 4th Generation wRfx: NONREACTIVE

## 2022-02-01 LAB — PROCALCITONIN: Procalcitonin: 0.17 ng/mL

## 2022-02-01 MED ORDER — METHYLPREDNISOLONE SODIUM SUCC 125 MG IJ SOLR: 125.0000 mg | Freq: Once | INTRAMUSCULAR | Status: DC

## 2022-02-01 MED ORDER — FUROSEMIDE 10 MG/ML IJ SOLN
40.0000 mg | Freq: Two times a day (BID) | INTRAMUSCULAR | Status: DC
  Administered 2022-02-01: 40 mg via INTRAVENOUS
  Filled 2022-02-01: qty 4

## 2022-02-01 MED ORDER — HYDRALAZINE HCL 50 MG PO TABS
100.0000 mg | ORAL_TABLET | Freq: Three times a day (TID) | ORAL | Status: DC
  Administered 2022-02-01 – 2022-02-03 (×6): 100 mg via ORAL
  Filled 2022-02-01 (×5): qty 2
  Filled 2022-02-01: qty 4
  Filled 2022-02-01: qty 2

## 2022-02-01 MED ORDER — DEXAMETHASONE SODIUM PHOSPHATE 10 MG/ML IJ SOLN
10.0000 mg | Freq: Once | INTRAMUSCULAR | Status: AC
  Administered 2022-02-01: 10 mg via INTRAVENOUS
  Filled 2022-02-01: qty 1

## 2022-02-01 MED ORDER — ISOSORBIDE MONONITRATE ER 30 MG PO TB24
30.0000 mg | ORAL_TABLET | Freq: Every day | ORAL | Status: DC
  Administered 2022-02-01 – 2022-02-06 (×6): 30 mg via ORAL
  Filled 2022-02-01 (×6): qty 1

## 2022-02-01 MED ORDER — LABETALOL HCL 200 MG PO TABS
200.0000 mg | ORAL_TABLET | Freq: Every day | ORAL | Status: DC
  Administered 2022-02-01 – 2022-02-02 (×2): 200 mg via ORAL
  Filled 2022-02-01 (×2): qty 1

## 2022-02-01 MED ORDER — MAGNESIUM SULFATE 2 GM/50ML IV SOLN: 2.0000 g | Freq: Once | INTRAVENOUS | Status: DC

## 2022-02-01 MED ORDER — BUDESON-GLYCOPYRROL-FORMOTEROL 160-9-4.8 MCG/ACT IN AERO: 2.0000 | INHALATION_SPRAY | Freq: Two times a day (BID) | RESPIRATORY_TRACT | Status: DC

## 2022-02-01 MED ORDER — LABETALOL HCL 200 MG PO TABS
300.0000 mg | ORAL_TABLET | Freq: Every day | ORAL | Status: DC
  Administered 2022-02-02 – 2022-02-03 (×2): 300 mg via ORAL
  Filled 2022-02-01 (×2): qty 1

## 2022-02-01 MED ORDER — SODIUM CHLORIDE 0.9 % IV SOLN: 250.0000 mL | INTRAVENOUS | Status: DC | PRN

## 2022-02-01 MED ORDER — SODIUM CHLORIDE 0.9% FLUSH
3.0000 mL | Freq: Two times a day (BID) | INTRAVENOUS | Status: DC
  Administered 2022-02-01 – 2022-02-03 (×4): 3 mL via INTRAVENOUS

## 2022-02-01 MED ORDER — ONDANSETRON HCL 4 MG/2ML IJ SOLN
4.0000 mg | Freq: Four times a day (QID) | INTRAMUSCULAR | Status: DC | PRN
  Administered 2022-02-02 – 2022-02-06 (×5): 4 mg via INTRAVENOUS
  Filled 2022-02-01 (×6): qty 2

## 2022-02-01 MED ORDER — LEVOFLOXACIN IN D5W 750 MG/150ML IV SOLN
750.0000 mg | Freq: Once | INTRAVENOUS | Status: AC
  Administered 2022-02-01: 750 mg via INTRAVENOUS
  Filled 2022-02-01: qty 150

## 2022-02-01 MED ORDER — UMECLIDINIUM BROMIDE 62.5 MCG/ACT IN AEPB
1.0000 | INHALATION_SPRAY | Freq: Every day | RESPIRATORY_TRACT | Status: DC
  Administered 2022-02-02 – 2022-02-07 (×6): 1 via RESPIRATORY_TRACT
  Filled 2022-02-01: qty 7

## 2022-02-01 MED ORDER — FUROSEMIDE 10 MG/ML IJ SOLN
40.0000 mg | Freq: Once | INTRAMUSCULAR | Status: AC
  Administered 2022-02-01: 40 mg via INTRAVENOUS
  Filled 2022-02-01: qty 4

## 2022-02-01 MED ORDER — ATORVASTATIN CALCIUM 80 MG PO TABS
80.0000 mg | ORAL_TABLET | Freq: Every day | ORAL | Status: DC
  Administered 2022-02-01 – 2022-02-06 (×6): 80 mg via ORAL
  Filled 2022-02-01 (×6): qty 1

## 2022-02-01 MED ORDER — PREDNISONE 20 MG PO TABS
40.0000 mg | ORAL_TABLET | Freq: Every day | ORAL | Status: DC
  Administered 2022-02-02: 40 mg via ORAL
  Filled 2022-02-01: qty 2

## 2022-02-01 MED ORDER — FUSION PLUS PO CAPS: 1.0000 | ORAL_CAPSULE | ORAL | Status: DC

## 2022-02-01 MED ORDER — IRBESARTAN 300 MG PO TABS: 300.0000 mg | ORAL_TABLET | Freq: Every day | ORAL | Status: DC

## 2022-02-01 MED ORDER — LEVOTHYROXINE SODIUM 100 MCG PO TABS
100.0000 ug | ORAL_TABLET | Freq: Every day | ORAL | Status: DC
  Administered 2022-02-02 – 2022-02-07 (×6): 100 ug via ORAL
  Filled 2022-02-01 (×6): qty 1

## 2022-02-01 MED ORDER — AMLODIPINE BESYLATE 10 MG PO TABS
10.0000 mg | ORAL_TABLET | Freq: Every day | ORAL | Status: DC
  Administered 2022-02-02 – 2022-02-07 (×6): 10 mg via ORAL
  Filled 2022-02-01 (×6): qty 1

## 2022-02-01 MED ORDER — LEVOFLOXACIN 750 MG PO TABS: 750.0000 mg | ORAL_TABLET | ORAL | Status: DC

## 2022-02-01 MED ORDER — ALBUTEROL SULFATE (2.5 MG/3ML) 0.083% IN NEBU
2.5000 mg | INHALATION_SOLUTION | RESPIRATORY_TRACT | Status: DC | PRN
  Administered 2022-02-01 – 2022-02-02 (×2): 2.5 mg via RESPIRATORY_TRACT
  Filled 2022-02-01 (×2): qty 3

## 2022-02-01 MED ORDER — ACETAMINOPHEN 325 MG PO TABS
650.0000 mg | ORAL_TABLET | ORAL | Status: DC | PRN
  Administered 2022-02-01 – 2022-02-02 (×2): 650 mg via ORAL
  Filled 2022-02-01 (×2): qty 2

## 2022-02-01 MED ORDER — IPRATROPIUM-ALBUTEROL 0.5-2.5 (3) MG/3ML IN SOLN
3.0000 mL | Freq: Four times a day (QID) | RESPIRATORY_TRACT | Status: DC
  Administered 2022-02-01 – 2022-02-04 (×10): 3 mL via RESPIRATORY_TRACT
  Filled 2022-02-01 (×12): qty 3

## 2022-02-01 MED ORDER — ENOXAPARIN SODIUM 30 MG/0.3ML IJ SOSY
30.0000 mg | PREFILLED_SYRINGE | INTRAMUSCULAR | Status: DC
  Administered 2022-02-01 – 2022-02-06 (×6): 30 mg via SUBCUTANEOUS
  Filled 2022-02-01 (×6): qty 0.3

## 2022-02-01 MED ORDER — ALBUTEROL SULFATE (2.5 MG/3ML) 0.083% IN NEBU
5.0000 mg | INHALATION_SOLUTION | Freq: Once | RESPIRATORY_TRACT | Status: AC
  Administered 2022-02-01: 5 mg via RESPIRATORY_TRACT
  Filled 2022-02-01: qty 6

## 2022-02-01 MED ORDER — SODIUM CHLORIDE 0.9% FLUSH: 3.0000 mL | INTRAVENOUS | Status: DC | PRN

## 2022-02-01 MED ORDER — FLUTICASONE FUROATE-VILANTEROL 200-25 MCG/ACT IN AEPB
1.0000 | INHALATION_SPRAY | Freq: Every day | RESPIRATORY_TRACT | Status: DC
  Administered 2022-02-02 – 2022-02-07 (×6): 1 via RESPIRATORY_TRACT
  Filled 2022-02-01: qty 28

## 2022-02-01 MED ORDER — NICOTINE 21 MG/24HR TD PT24
21.0000 mg | MEDICATED_PATCH | Freq: Every day | TRANSDERMAL | Status: DC
  Administered 2022-02-01 – 2022-02-06 (×6): 21 mg via TRANSDERMAL
  Filled 2022-02-01 (×7): qty 1

## 2022-02-01 NOTE — H&P (Signed)
?History and Physical  ? ? ?Gwendolyn [name]:[id number] DOB: 12-01-1955 DOA: 02/01/2022 ? ?PCP: Jilda Panda, MD (Confirm with patient/family/NH records and if not entered, this has to be entered at [facility] point of entry) ?Patient coming from: Home ? ?I have personally briefly reviewed patient's old medical records in [place] ? ?Chief Complaint: Cough, SOB ? ?HPI: Gwendolyn [name] is a 66 y.o. female with medical history significant of chronic diastolic CHF, HTN, hypothyroidism, COPD/asthma, HLD, presented with increasing cough and shortness of breath. ? ?Breathing symptoms started about 2 weeks ago, went to see PCP and was diagnosed with COPD exacerbation and was given 7 days worth of Z-Pak which she completed yesterday.  She also went to see cardiologist last week and was found her blood pressure significant elevated, labetalol was increased from 200 twice daily to 400 twice daily, and Lasix increased from 20 mg daily to 40 mg daily.  Yesterday, she went back to see cardiology, and her symptoms not improving and her blood pressure remain elevated cardiology sent her to ED.  Her symptoms including dry cough and wheezing, no swelling, no chest pains.  She also has orthopnea, did not sleep for last 2 nights because of the shortness of breath when lying flat.  She continue to smoke 1 pack a day, she does not have nebulizer or albuterol inhaler or home oxygen.  Never seen pulmonology for lung function test. ? ?ED Course: Significant respiratory distress, tachypneic, patient was placed on BiPAP.  Chest x-ray showed bilateral pulmonary edema. ? ?1 dose of 40 mg IV Lasix given, kidney function came back creatinine 1.9 compared to baseline 0.7, 5 months ago. ? ?Review of Systems: As per HPI otherwise 14 point review of systems negative.  ? ? ?Past Medical History:  ?Diagnosis Date  ? Anemia   ? Anxiety   ? Asthma   ? COPD (chronic obstructive pulmonary disease) ([place])   ? Depression   ? GERD (gastroesophageal  reflux disease)   ? History of kidney stones   ? Hypertension   ? Hypothyroidism   ? ? ?Past Surgical History:  ?Procedure Laterality Date  ? ABDOMINAL HYSTERECTOMY    ? Cesarean Section    ? (2)  ? CHOLECYSTECTOMY    ? COLONOSCOPY  2020  ? Gastric Ulcer  2020  ? UMBILICAL HERNIA REPAIR N/A 10/29/2020  ? Procedure: OPEN UMBILICAL HERNIA REPAIR WITH MESH;  Surgeon: Stechschulte, Nickola Major, MD;  Location: [facility];  Service: General;  Laterality: N/A;  ? ? ? reports that she has been smoking cigarettes. She has a 40.00 pack-year smoking history. She has never used smokeless tobacco. She reports that she does not drink alcohol and does not use drugs. ? ?Allergies  ?Allergen Reactions  ? Doxycycline Anaphylaxis  ? Azilsartan   ?  Other reaction(s): cough  ? Biaxin [Clarithromycin] Other (See Comments)  ?  Vertigo  ? Floxacillin [Floxacillin (Flucloxacillin)] Other (See Comments)  ?  Vertigo  ? Morphine And Related Other (See Comments)  ?  Headache  ? ? ?Family History  ?Problem Relation Age of Onset  ? Colon cancer Mother   ? Diabetes Father   ? Diabetes Sister   ? Breast cancer Sister 50  ? Hypertension Sister   ? ? ? ?Prior to Admission medications   ?Medication Sig Start Date End Date Taking? Authorizing Provider  ?amLODipine (NORVASC) 5 MG tablet Take 10 mg by mouth daily. 06/12/21  Yes [provider]  ?atorvastatin (LIPITOR) 80  MG tablet Take 80 mg by mouth daily. 09/01/21  Yes [provider]  ?Budeson-Glycopyrrol-Formoterol (BREZTRI AEROSPHERE) 160-9-4.8 MCG/ACT AERO Inhale 2 puffs into the lungs in the morning and at bedtime.   Yes [provider]  ?furosemide (LASIX) 40 MG tablet Take 40 mg by mouth daily.   Yes [provider]  ?hydrALAZINE (APRESOLINE) 50 MG tablet TAKE 1 TABLET(50 MG) BY MOUTH THREE TIMES DAILY ?Patient taking differently: Take 50 mg by mouth 3 (three) times daily. 01/31/22  Yes Cantwell, Celeste C, PA-C  ?Iron-FA-B Cmp-C-Biot-Probiotic (FUSION PLUS) CAPS Take 1  capsule by mouth every other day.   Yes [provider]  ?labetalol (NORMODYNE) 200 MG tablet Take 1 tablet (200 mg total) by mouth 2 (two) times daily. 300 MG IN THE AM 200 MG IN THE PM ?Patient taking differently: Take 400 mg by mouth 2 (two) times daily. 01/31/22  Yes Cantwell, Celeste C, PA-C  ?levothyroxine (SYNTHROID) 137 MCG tablet Take 137 mcg by mouth daily before breakfast.   Yes [provider]  ?olmesartan (BENICAR) 40 MG tablet Take 40 mg by mouth daily. 12/06/21  Yes [provider]  ?potassium chloride (KLOR-CON) 10 MEQ tablet Take 1 tablet (10 mEq total) by mouth daily as needed. With Furosemide only 01/30/22 04/30/22 Yes Adrian Prows, MD  ?furosemide (LASIX) 20 MG tablet Take 1 tablet (20 mg total) by mouth daily as needed for fluid (shortness of breath annd 2 Lbs wieght gain). ?Patient not taking: Reported on 02/01/2022 01/30/22 04/30/22  Adrian Prows, MD  ? ? ?Physical Exam: ?Vitals:  ? 02/01/22 1518 02/01/22 1519 02/01/22 1619 02/01/22 1700  ?BP: (!) 214/76   (!) 186/70  ?Pulse: (!) 103   93  ?Resp: (!) 23   (!) 24  ?Temp: 97.9 ?F (36.6 ?C)     ?TempSrc: Oral     ?SpO2: 93%  100% 100%  ?Weight:  64 kg    ?Height:  '4\' 11"'$  (1.499 m)    ? ? ?Constitutional: NAD, calm, comfortable ?Vitals:  ? 02/01/22 1518 02/01/22 1519 02/01/22 1619 02/01/22 1700  ?BP: (!) 214/76   (!) 186/70  ?Pulse: (!) 103   93  ?Resp: (!) 23   (!) 24  ?Temp: 97.9 ?F (36.6 ?C)     ?TempSrc: Oral     ?SpO2: 93%  100% 100%  ?Weight:  64 kg    ?Height:  '4\' 11"'$  (1.499 m)    ? ?Eyes: PERRL, lids and conjunctivae normal ?ENMT: Mucous membranes are moist. Posterior pharynx clear of any exudate or lesions.Normal dentition.  ?Neck: normal, supple, no masses, no thyromegaly ?Respiratory: Diminished breathing sound bilaterally, scattered wheezing, no crackles. Normal respiratory effort. No accessory muscle use.  ?Cardiovascular: Regular rate and rhythm, no murmurs / rubs / gallops. No extremity edema. 2+ pedal pulses. No  carotid bruits.  ?Abdomen: no tenderness, no masses palpated. No hepatosplenomegaly. Bowel sounds positive.  ?Musculoskeletal: no clubbing / cyanosis. No joint deformity upper and lower extremities. Good ROM, no contractures. Normal muscle tone.  ?Skin: no rashes, lesions, ulcers. No induration ?Neurologic: CN 2-12 grossly intact. Sensation intact, DTR normal. Strength 5/5 in all 4.  ?Psychiatric: Normal judgment and insight. Alert and oriented x 3. Normal mood.  ? ? ? ?Labs on Admission: I have personally reviewed following labs and imaging studies ? ?CBC: ?Recent Labs  ?Lab 02/01/22 ?1520  ?WBC 21.2*  ?NEUTROABS 18.0*  ?HGB 11.3*  ?HCT 35.4*  ?MCV 82.5  ?PLT 242  ? ?Basic Metabolic Panel: ?No  results for input(s): NA, K, CL, CO2, GLUCOSE, BUN, CREATININE, CALCIUM, MG, PHOS in the last 168 hours. ?GFR: ?CrCl cannot be calculated (Patient's most recent lab result is older than the maximum 21 days allowed.). ?Liver Function Tests: ?No results for input(s): AST, ALT, ALKPHOS, BILITOT, PROT, ALBUMIN in the last 168 hours. ?No results for input(s): LIPASE, AMYLASE in the last 168 hours. ?No results for input(s): AMMONIA in the last 168 hours. ?Coagulation Profile: ?No results for input(s): INR, PROTIME in the last 168 hours. ?Cardiac Enzymes: ?No results for input(s): CKTOTAL, CKMB, CKMBINDEX, TROPONINI in the last 168 hours. ?BNP (last 3 results) ?No results for input(s): PROBNP in the last 8760 hours. ?HbA1C: ?No results for input(s): HGBA1C in the last 72 hours. ?CBG: ?No results for input(s): GLUCAP in the last 168 hours. ?Lipid Profile: ?No results for input(s): CHOL, HDL, LDLCALC, TRIG, CHOLHDL, LDLDIRECT in the last 72 hours. ?Thyroid Function Tests: ?Recent Labs  ?  02/01/22 ?1520 02/01/22 ?1522  ?TSH  --  0.225*  ?FREET4 1.34*  --   ? ?Anemia Panel: ?No results for input(s): VITAMINB12, FOLATE, FERRITIN, TIBC, IRON, RETICCTPCT in the last 72 hours. ?Urine analysis: ?   ?Component Value Date/Time  ? COLORURINE  YELLOW 08/27/2021 1204  ? APPEARANCEUR CLEAR 08/27/2021 1204  ? LABSPEC >1.046 (H) 08/27/2021 1204  ? PHURINE 6.0 08/27/2021 1204  ? GLUCOSEU NEGATIVE 08/27/2021 1204  ? HGBUR NEGATIVE 08/27/2021 1204  ? BI

## 2022-02-01 NOTE — ED Triage Notes (Signed)
Pt BIB EMS from work c/o SOB x 2 weeks, worse while lying down, been sitting up to sleep. Seen cardiology today for CHF workup and Echo, unable to finish due to positional SOB. Initial SPO2 was 82%--> 94% on 5LNC. Denies fever, no chest pain. H/O HTN ? ?212/86 ?HR 96 ?94% on 5LNC ? ?

## 2022-02-01 NOTE — Consult Note (Addendum)
CARDIOLOGY CONSULT NOTE  ?Patient ID: ?Gwendolyn [name] ?MRN: [id number] ?DOB/AGE: [birth year] 66 y.o. ? ?Admit date: 02/01/2022 ?Referring Physician: Zacarias Pontes, ER ?Primary Physician: Jilda Panda, MD ?Reason for Consultation: Shortness of breath ? ?HPI:  ? ?66 year old Caucasian female with resistant hypertension, mixed hyperlipidemia, nicotine dependence, severe distal aortic stenosis, as well as suspected high-grade renal artery stenosis, COPD, HFpEF, admitted with severe shortness of breath. ? ?Patient was seen in our office yesterday by Lawerance Cruel, PA.  Suspecting heart failure exacerbation, Lasix was increased to 40 mg p.o. daily.  Patient was in our office getting echocardiogram today.  However, she was unable to lay flat with severe shortness of breath.  She was sent to emergency room. ? ?On my arrival to the room, patient is sitting up in a tripod position, having difficulty breathing.  Blood pressure is elevated.  She is on supplemental oxygen with oxygen saturation in low 90s.  She denies any fever, chills, but has had dry cough for last several days.  Unfortunately, she continues to smoke half pack a day. ? ?Past Medical History:  ?Diagnosis Date  ? Anemia   ? Anxiety   ? Asthma   ? COPD (chronic obstructive pulmonary disease) ([place])   ? Depression   ? GERD (gastroesophageal reflux disease)   ? History of kidney stones   ? Hypertension   ? Hypothyroidism   ?  ? ?Past Surgical History:  ?Procedure Laterality Date  ? ABDOMINAL HYSTERECTOMY    ? Cesarean Section    ? (2)  ? CHOLECYSTECTOMY    ? COLONOSCOPY  2020  ? Gastric Ulcer  2020  ? UMBILICAL HERNIA REPAIR N/A 10/29/2020  ? Procedure: OPEN UMBILICAL HERNIA REPAIR WITH MESH;  Surgeon: Stechschulte, Nickola Major, MD;  Location: [place];  Service: General;  Laterality: N/A;  ?  ? ? ?Family History  ?Problem Relation Age of Onset  ? Colon cancer Mother   ? Diabetes Father   ? Diabetes Sister   ? Breast cancer Sister 25  ? Hypertension Sister   ?  ? ?Social  History: ?Social History  ? ?Socioeconomic History  ? Marital status: Widowed  ?  Spouse name: Not on file  ? Number of children: 2  ? Years of education: Not on file  ? Highest education level: Not on file  ?Occupational History  ? Not on file  ?Tobacco Use  ? Smoking status: Every Day  ?  Packs/day: 1.00  ?  Years: 40.00  ?  Pack years: 40.00  ?  Types: Cigarettes  ? Smokeless tobacco: Never  ?Vaping Use  ? Vaping Use: Never used  ?Substance and Sexual Activity  ? Alcohol use: Never  ? Drug use: Never  ? Sexual activity: Not on file  ?  Comment: hysterectomy  ?Other Topics Concern  ? Not on file  ?Social History Narrative  ? Not on file  ? ?Social Determinants of Health  ? ?Financial Resource Strain: Not on file  ?Food Insecurity: Not on file  ?Transportation Needs: Not on file  ?Physical Activity: Not on file  ?Stress: Not on file  ?Social Connections: Not on file  ?Intimate Partner Violence: Not on file  ?  ? ?(Not in a hospital admission) ? ? ?Review of Systems  ?Cardiovascular:  Positive for dyspnea on exertion and orthopnea. Negative for chest pain, leg swelling, palpitations and syncope.  ?Respiratory:  Positive for cough and shortness of breath.   ?  ? ?Physical Exam: ?Physical Exam ?Vitals and  nursing note reviewed.  ?Constitutional:   ?   General: She is not in acute distress. ?Neck:  ?   Vascular: No JVD.  ?Cardiovascular:  ?   Rate and Rhythm: Normal rate and regular rhythm.  ?   Heart sounds: Normal heart sounds. No murmur heard. ?Pulmonary:  ?   Effort: Tachypnea, accessory muscle usage and respiratory distress present.  ?   Breath sounds: Normal breath sounds. Decreased air movement present. No wheezing or rales.  ?Musculoskeletal:  ?   Right lower leg: No edema.  ?   Left lower leg: No edema.  ? ? ? ?  ?Lab Results: ?Reviewed and interpreted: ?CBC showing leukocytosis, white count 21 K ? ?Imaging/tests reviewed and independently interpreted: ?Chest x-ray 02/01/2022: ?1. Cardiomegaly with pulmonary  vascular congestion. ?2. Bilateral lower lobe hazy opacities concerning for mild pulmonary ?edema or infiltrate. Small bilateral pleural effusions. ?  ?Personally reviewed and compared with prior chest x-rays.   ?Reticular interstitial pattern more pronounced on the right, worse than before. ? ?Echocardiogram 02/01/2022: ?Study interrupted due to severe shortness of breath. ?Limited images in parasternal long axis show ejection fraction around 45-50%. ? ?Echocardiogram 09/09/2021:  ?Normal LV systolic function with visual EF 55-60%. Left ventricle cavity  ?is normal in size. Mild left ventricular hypertrophy. Normal global wall  ?motion. Doppler evidence of grade II diastolic dysfunction, elevated LAP.  ?Left atrial cavity is severely dilated.  ?Aortic valve sclerosis without stenosis.  ?Mild (Grade I) aortic regurgitation.  ?Mild (Grade I) mitral regurgitation.  ?Mild tricuspid regurgitation. No evidence of pulmonary hypertension.  ?Small circumferential pericardial effusion. There is no hemodynamic  ?significance.  ?IVC is dilated with a respiratory response of >50%.  ?No prior study for comparison. ? ?CT abdomen/pelvis w/ contrast 08/27/2021: ?1. Very severe calcified atherosclerosis of the abdominal aorta with ?NEAR AORTIC OCCLUSION at the SMA level. ?Associated High-grade Stenosis of nearly all major aortic branch ?origins. And high-grade stenosis of the left Common iliac artery ?also due to bulky calcified plaque. ?Recommend Vascular Surgery Consultation. Aortic Atherosclerosis ?(ICD10-I70.0). ?  ?2. Superimposed mild inflammatory stranding in the right upper ?quadrant, near the porta hepatis and hepatic flexure of colon. This ?might be a mild Acute Colitis - perhaps a low-grade ischemic colitis ?in light of #1, uncertain. ?  ?3. Trace bilateral pleural effusions, larger on the right. Cardiac ?enlargement since 2008, borderline cardiomegaly with trace ?pericardial fluid. ?  ?4. No other acute or inflammatory  process identified in the abdomen ?or pelvis. ?Bilateral nephrolithiasis with developing staghorn calculus in the ?right lower pole. ?Chronic postinflammatory calcification of upper mesenteric lymph ?nodes, an area affected by acute pancreatitis in 2008. ?  ? ? ?Cardiac Studies: ? ?Telemetry 02/01/2022: ?No significant arrhythmia ? ?EKG 02/01/2022:: ?Sinus rhythm 90 bpm ?Left atrial enlargement ?LVH ?Possible old anterior infarct ? ?Assessment & Recommendations: ? ? ?66 year old Caucasian female with resistant hypertension, mixed hyperlipidemia, nicotine dependence, severe distal aortic stenosis, as well as suspected high-grade renal artery stenosis, COPD, HFpEF, admitted with acute hypoxic respiratory failure ? ?Acute hypoxic respiratory failure: ?Likely multifactorial, including hypertensive emergency, COPD exacerbation, as well as possible acute on chronic HFpEF ?Patient sitting in tripod position with accessory muscle use with minimal air movement on my exam. ?Leukocytosis on CBC. ?Chest x-ray does show mild interstitial edema, however her presentation is out of proportion to her heart failure. ?Recommend medical admission, BiPAP, bronchodilator treatment. ? ?HFpEF: ?Limited echocardiogram images performed in the office today.  Difficult to evaluate EF, but probably appears 45  to 50%.  No enough information regarding valvular and diastolic function available. ?Echocardiogram in 09/2021 shows normal EF, grade 2 diastolic dysfunction. ?Reasonable to use IV Lasix 40 mg twice daily at this time, pending ongoing management of other causes of respiratory failure, as detailed above. ? ?Hypertensive emergency: ?Underlying suspected high-grade renal artery stenosis. ?Hypertension management is tricky due to renal artery stenosis. ?I reckon labetalol, otherwise necessary for hypertension management, could be contributing to bronchospasm. ?Recommend amlodipine 10 mg home medication, in addition to hydralazine at increased  dose of 100 mg 3 times daily. ?May also need addition of clonidine, if no improvement. ?Finally, recommend renal artery duplex to evaluate for renal artery stenosis.  She may well need treatment for this given

## 2022-02-01 NOTE — ED Provider Notes (Signed)
?[facility] ?Provider Note ? ? ?CSN: [id number] ?Arrival date & time: 02/01/22  1503 ? ?  ? ?History ? ?Chief Complaint  ?Patient presents with  ? Shortness of Breath  ? ? ?Gwendolyn [name] is a 65 y.o. female. ? ?HPI ?Presents after developing dyspnea while attempting to have outpatient echocardiogram. ?She has multiple medical issues including CHF, was seen yesterday by cardiology, sent for today's echo.  She notes that she had medication changes at that time as well.  She has been taking her medication as directed, and recently completed a course of azithromycin.  However, she has had worsening dyspnea for about 2 weeks, particularly symptomatic with horizontal positioning. ?Today orthopnea was too severe for her to complete the study, reportedly saturation went from 94% now 82%.  With 5 L via nasal cannula she achieved 99%.  She arrives via EMS, history is obtained with [facility],/nursing as well. ?  ? ?Home Medications ?Prior to Admission medications   ?Medication Sig Start Date End Date Taking? Authorizing Provider  ?amLODipine (NORVASC) 5 MG tablet Take 10 mg by mouth daily. 06/12/21   [provider]  ?atorvastatin (LIPITOR) 80 MG tablet Take 80 mg by mouth daily. 09/01/21   [provider]  ?Budeson-Glycopyrrol-Formoterol (BREZTRI AEROSPHERE) 160-9-4.8 MCG/ACT AERO Inhale into the lungs.    [provider]  ?furosemide (LASIX) 20 MG tablet Take 1 tablet (20 mg total) by mouth daily as needed for fluid (shortness of breath annd 2 Lbs wieght gain). 01/30/22 04/30/22  Adrian Prows, MD  ?hydrALAZINE (APRESOLINE) 50 MG tablet TAKE 1 TABLET(50 MG) BY MOUTH THREE TIMES DAILY 01/31/22   Cantwell, Celeste C, PA-C  ?Iron-FA-B Cmp-C-Biot-Probiotic (FUSION PLUS) CAPS Take by mouth.    [provider]  ?labetalol (NORMODYNE) 200 MG tablet Take 1 tablet (200 mg total) by mouth 2 (two) times daily. 300 MG IN THE AM 200 MG IN THE PM 01/31/22   Cantwell,  Celeste C, PA-C  ?levothyroxine (SYNTHROID) 150 MCG tablet Take 137 mcg by mouth daily before breakfast.    [provider]  ?olmesartan (BENICAR) 40 MG tablet Take 40 mg by mouth daily. 12/06/21   [provider]  ?potassium chloride (KLOR-CON) 10 MEQ tablet Take 1 tablet (10 mEq total) by mouth daily as needed. With Furosemide only ?Patient not taking: Reported on 01/31/2022 01/30/22 04/30/22  Adrian Prows, MD  ?   ? ?Allergies    ?Doxycycline, Azilsartan, Biaxin [clarithromycin], Floxacillin [floxacillin (flucloxacillin)], and Morphine and related   ? ?Review of Systems   ?Review of Systems  ?Constitutional:   ?     Per HPI, otherwise negative  ?HENT:    ?     Per HPI, otherwise negative  ?Respiratory:    ?     Per HPI, otherwise negative  ?Cardiovascular:   ?     Per HPI, otherwise negative  ?Gastrointestinal:  Negative for vomiting.  ?Endocrine:  ?     Negative aside from HPI  ?Genitourinary:   ?     Neg aside from HPI   ?Musculoskeletal:   ?     Per HPI, otherwise negative  ?Skin: Negative.   ?Neurological:  Negative for syncope.  ? ?Physical Exam ?Updated Vital Signs ?BP (!) 214/76 (BP Location: Right Arm)   Pulse (!) 103   Temp 97.9 ?F (36.6 ?C) (Oral)   Resp (!) 23   Ht '4\' 11"'$  (1.499 m)   Wt 64 kg   LMP  (LMP  Unknown)   SpO2 93%   BMI 28.48 kg/m?  ?Physical Exam ?Vitals and nursing note reviewed.  ?Constitutional:   ?   General: She is not in acute distress. ?   Appearance: She is well-developed.  ?HENT:  ?   Head: Normocephalic and atraumatic.  ?Eyes:  ?   Conjunctiva/sclera: Conjunctivae normal.  ?Cardiovascular:  ?   Rate and Rhythm: Regular rhythm. Tachycardia present.  ?Pulmonary:  ?   Effort: Tachypnea and accessory muscle usage present.  ?   Breath sounds: Decreased breath sounds and wheezing present.  ?Abdominal:  ?   General: There is no distension.  ?Skin: ?   General: Skin is warm and dry.  ?Neurological:  ?   Mental Status: She is alert and oriented to person, place, and  time.  ?   Cranial Nerves: No cranial nerve deficit.  ?Psychiatric:     ?   Mood and Affect: Mood normal.  ? ? ?ED Results / Procedures / Treatments   ?Labs ?(all labs ordered are listed, but only abnormal results are displayed) ?Labs Reviewed  ?RESP PANEL BY RT-PCR (FLU A&B, COVID) [id number]  ?COMPREHENSIVE METABOLIC PANEL  ?CBC WITH DIFFERENTIAL/PLATELET  ?BRAIN NATRIURETIC PEPTIDE  ?TSH  ?T4, FREE  ? ? ?EKG ?None ? ?Radiology ?No results found. ? ?Procedures ?Procedures  ? ? ?Medications Ordered in ED ?Medications  ?furosemide (LASIX) injection 40 mg (40 mg Intravenous Given 02/01/22 1533)  ? ? ?ED Course/ Medical Decision Making/ A&P ?This patient with a Hx of CHF, COPD, hypertension presents to the ED for concern of dyspnea, orthopnea, new oxygen requirement, this involves an extensive number of treatment options, and is a complaint that carries with it a high risk of complications and morbidity.   ? ?The differential diagnosis includes mixed etiology respiratory distress versus CHF exacerbation, less likely pneumonia given the absence of fever, cough ? ? ?Social Determinants of Health: ? ?Age ? ?Additional history obtained: ? ?Additional history and/or information obtained from EMS, cardiology, notable for EMS notes above, cardiology visit yesterday, with change in meds, plan for echo due to concern for worsening heart failure ? ? ?After the initial evaluation, orders, including: Labs, IV Lasix, x-ray were initiated. ? ? ?Patient placed on Cardiac and Pulse-Oximetry Monitors. ?The patient was maintained on a cardiac monitor.  The cardiac monitored showed an rhythm of 110 sinus tach abnormal ?The patient was also maintained on pulse oximetry. The readings were typically 99% 5 L nasal cannula abnormal ? ? ?On repeat evaluation of the patient improved ? ?Lab Tests: ? ?I personally interpreted labs.  The pertinent results include: Findings consistent with hypothyroidism, leukocytosis and BNP, 1800 ? ?Imaging Studies  ordered: ? ?I independently visualized and interpreted imaging which showed cardiomegaly, pulmonary vascular congestion ?I agree with the radiologist interpretation ? ?Consultations Obtained: ? ?I requested consultation with the cardiology,  and discussed lab and imaging findings as well as pertinent plan - they recommend: Admission, BiPAP, previously started ? ?Dispostion / Final MDM: ? ?After consideration of the diagnostic results and the patient's response to treatment, she will require admission.  This adult female presents in respiratory distress, after having saw evaluation as an outpatient, but feeling to be able to complete her studies due to dyspnea, orthopnea. ?Here patient found to have new oxygen requirement, concern for heart failure exacerbation likely exacerbated by COPD.  Patient is mentating appropriately is afebrile, awake and alert, no evidence of bacteremia, sepsis, though she does have mild leukocytosis.  Patient required BiPAP,  Levaquin, steroids, bronchodilators, admission for monitoring, management. ? ?Final Clinical Impression(s) / ED Diagnoses ?Final diagnoses:  ?Respiratory distress  ? ?CRITICAL CARE ?Performed by: Carmin Muskrat ?Total critical care time: 35 minutes ?Critical care time was exclusive of separately billable procedures and treating other patients. ?Critical care was necessary to treat or prevent imminent or life-threatening deterioration. ?Critical care was time spent personally by me on the following activities: development of treatment plan with patient and/or surrogate as well as nursing, discussions with consultants, evaluation of patient's response to treatment, examination of patient, obtaining history from patient or surrogate, ordering and performing treatments and interventions, ordering and review of laboratory studies, ordering and review of radiographic studies, pulse oximetry and re-evaluation of patient's condition. ? ?  ?Carmin Muskrat, MD ?02/01/22  1654 ? ?

## 2022-02-01 NOTE — Progress Notes (Signed)
Placed patient on 2lpm, patient stated she was breathing easier. Will continue to monitor patient. B/S decreased with crackles in lower lobes.  ?

## 2022-02-01 NOTE — Progress Notes (Signed)
Patient placed on Bipap per MD order. ALB neb running through bipap. Vitals are stable. RN made aware. ?

## 2022-02-01 NOTE — ED Notes (Signed)
Angela phlebotomist in room drawing blood. ?

## 2022-02-01 NOTE — ED Notes (Signed)
Patient placed back on BiPAP per RT ?

## 2022-02-01 NOTE — ED Notes (Signed)
[facility] cards to see ?  ?Carmin Muskrat, MD ?02/01/22 1600 ? ?

## 2022-02-01 NOTE — Progress Notes (Signed)
Placed patient back on bipap  

## 2022-02-02 ENCOUNTER — Other Ambulatory Visit (HOSPITAL_COMMUNITY): Payer: Self-pay

## 2022-02-02 ENCOUNTER — Inpatient Hospital Stay (HOSPITAL_COMMUNITY): Payer: Medicare Other

## 2022-02-02 ENCOUNTER — Encounter (HOSPITAL_COMMUNITY): Payer: Self-pay | Admitting: Internal Medicine

## 2022-02-02 DIAGNOSIS — N179 Acute kidney failure, unspecified: Secondary | ICD-10-CM

## 2022-02-02 DIAGNOSIS — I160 Hypertensive urgency: Secondary | ICD-10-CM

## 2022-02-02 DIAGNOSIS — I5033 Acute on chronic diastolic (congestive) heart failure: Secondary | ICD-10-CM | POA: Diagnosis not present

## 2022-02-02 DIAGNOSIS — R0603 Acute respiratory distress: Secondary | ICD-10-CM

## 2022-02-02 LAB — BASIC METABOLIC PANEL
Anion gap: 11 (ref 5–15)
BUN: 33 mg/dL — ABNORMAL HIGH (ref 8–23)
CO2: 17 mmol/L — ABNORMAL LOW (ref 22–32)
Calcium: 8.9 mg/dL (ref 8.9–10.3)
Chloride: 105 mmol/L (ref 98–111)
Creatinine, Ser: 3.14 mg/dL — ABNORMAL HIGH (ref 0.44–1.00)
GFR, Estimated: 16 mL/min — ABNORMAL LOW (ref >60)
Glucose, Bld: 131 mg/dL — ABNORMAL HIGH (ref 70–99)
Potassium: 4.9 mmol/L (ref 3.5–5.1)
Sodium: 133 mmol/L — ABNORMAL LOW (ref 135–145)

## 2022-02-02 LAB — BLOOD GAS, VENOUS
Acid-base deficit: 6 mmol/L — ABNORMAL HIGH (ref 0.0–2.0)
Bicarbonate: 17.6 mmol/L — ABNORMAL LOW (ref 20.0–28.0)
Drawn by: 6775
O2 Saturation: 98.1 %
Patient temperature: 36.6
pCO2, Ven: 28 mmHg — ABNORMAL LOW (ref 44–60)
pH, Ven: 7.4 (ref 7.25–7.43)
pO2, Ven: 111 mmHg — ABNORMAL HIGH (ref 32–45)

## 2022-02-02 LAB — CBC WITH DIFFERENTIAL/PLATELET
Abs Immature Granulocytes: 0.08 10*3/uL — ABNORMAL HIGH (ref 0.00–0.07)
Basophils Absolute: 0 10*3/uL (ref 0.0–0.1)
Basophils Relative: 0 %
Eosinophils Absolute: 0 10*3/uL (ref 0.0–0.5)
Eosinophils Relative: 0 %
HCT: 28.2 % — ABNORMAL LOW (ref 36.0–46.0)
Hemoglobin: 9.1 g/dL — ABNORMAL LOW (ref 12.0–15.0)
Immature Granulocytes: 1 %
Lymphocytes Relative: 7 %
Lymphs Abs: 0.8 10*3/uL (ref 0.7–4.0)
MCH: 26 pg (ref 26.0–34.0)
MCHC: 32.3 g/dL (ref 30.0–36.0)
MCV: 80.6 fL (ref 80.0–100.0)
Monocytes Absolute: 0.5 10*3/uL (ref 0.1–1.0)
Monocytes Relative: 5 %
Neutro Abs: 9.8 10*3/uL — ABNORMAL HIGH (ref 1.7–7.7)
Neutrophils Relative %: 87 %
Platelets: 210 10*3/uL (ref 150–400)
RBC: 3.5 MIL/uL — ABNORMAL LOW (ref 3.87–5.11)
RDW: 17.2 % — ABNORMAL HIGH (ref 11.5–15.5)
WBC: 11.2 10*3/uL — ABNORMAL HIGH (ref 4.0–10.5)
nRBC: 0 % (ref 0.0–0.2)

## 2022-02-02 LAB — PROCALCITONIN: Procalcitonin: 1.37 ng/mL

## 2022-02-02 MED ORDER — BOOST / RESOURCE BREEZE PO LIQD CUSTOM
1.0000 | Freq: Three times a day (TID) | ORAL | Status: DC
  Administered 2022-02-02 – 2022-02-05 (×5): 1 via ORAL

## 2022-02-02 MED ORDER — ADULT MULTIVITAMIN W/MINERALS CH
1.0000 | ORAL_TABLET | Freq: Every day | ORAL | Status: DC
  Administered 2022-02-02 – 2022-02-07 (×6): 1 via ORAL
  Filled 2022-02-02 (×6): qty 1

## 2022-02-02 NOTE — Progress Notes (Signed)
?PROGRESS NOTE ? ? ? ?Gwendolyn [name]  GMW:[id number] DOB: 1956-08-06 DOA: 02/01/2022 ?PCP: Jilda Panda, MD  ?Narrative: 65/F with history of COPD, tobacco abuse, resistant hypertension, high-grade distal aortic stenosis, chronic diastolic CHF presented to the ED 3/28 with worsening dyspnea.  Recently Lasix dose was increased to 40 Mg daily. ?-In the ED she was acutely dyspneic, hypoxic, chest x-ray noted pulmonary vascular congestion and interstitial edema, labs noted BNP of 1788, creatinine of 1.9 up from baseline of 0.7 ? ? ?Subjective: ?-Breathing better today, some nausea, vomited x1 ? ?Assessment and Plan: ? ?Acute hypoxic respiratory failure ?Acute diastolic CHF ?-Recent echo 11/22 noted EF of 55-60% ?-Diuresed with IV Lasix yesterday, urine output not recorded in the ED ?-Hold further diuretics today, will request nephrology input ? ?Acute kidney injury ?-Baseline creatinine around 0.7, this has trended up to 1.99, now 3.1 ?-Possibly cardiorenal ?-Renal ultrasound without hydronephrosis, ARB on hold ?-Renal artery duplex-normal on the right, 1-59% stenosis on the left ?-Severe distal aortic stenosis could potentially contribute, no symptoms of intestinal ischemia at this time, avoid hypotension ? ?Uncontrolled hypertension ?-Improving, continue hydralazine seen, isosorbide, amlodipine, labetalol ? ?COPD ?Tobacco abuse ?-Clinically do not suspect COPD exacerbation at this time, will discontinue Levaquin and monitor ?-Continue ICS/LABA, DuoNebs ? ?Severe distal aortic stenosis ?-Diagnosed on CT: 10/22-noted very severe calcified atherosclerosis of the abdominal aorta with ?NEAR AORTIC OCCLUSION at the SMA level.Associated High-grade Stenosis of nearly all major aortic branch origins. And high-grade stenosis of the left Common iliac artery ?also due to bulky calcified plaque. ?-Saw Dr. Scot Dock in the ED 10/21, he recommended smoking cessation, if she were to develop symptoms of mesenteric ischemia only  surgical option could be aortic endarterectomy at a tertiary care hospital like [facility] ? ?Hypothyroidism ?-Continue Synthroid ? ?DVT prophylaxis: Lovenox ?Code Status: Full code ?Family Communication: Discussed with patient in detail, no family at bedside ?Disposition Plan: To be determined ? ?Consultants:  ?Nephrology, cardiology ? ?Procedures:  ? ?Antimicrobials:  ? ? ?Objective: ?Vitals:  ? 02/02/22 4403 02/02/22 0319 02/02/22 0749 02/02/22 0752  ?BP:  (!) 151/59 (!) 150/54   ?Pulse: 86 85 88   ?Resp: (!) 27 (!) 23 20   ?Temp:  97.9 ?F (36.6 ?C) 98.1 ?F (36.7 ?C)   ?TempSrc:  Axillary Oral   ?SpO2: 98% 100% 99% 98%  ?Weight:      ?Height:      ? ? ?Intake/Output Summary (Last 24 hours) at 02/02/2022 1101 ?Last data filed at 02/02/2022 0556 ?Gross per 24 hour  ?Intake 120 ml  ?Output 0 ml  ?Net 120 ml  ? ?Filed Weights  ? 02/01/22 1519 02/02/22 0024  ?Weight: 64 kg 64.4 kg  ? ? ?Examination: ? ?General exam: Appears calm and comfortable  ?Respiratory system: Clear to auscultation ?Cardiovascular system: S1 & S2 heard, RRR.  ?Abd: nondistended, soft and nontender.Normal bowel sounds heard. ?Central nervous system: Alert and oriented. No focal neurological deficits. ?Extremities: Sno edema ?Skin: No rashes ?Psychiatry: Judgement and insight appear normal. Mood & affect appropriate.  ? ? ? ?Data Reviewed:  ? ?CBC: ?Recent Labs  ?Lab 02/01/22 ?1520 02/01/22 ?1830  ?WBC 21.2*  --   ?NEUTROABS 18.0*  --   ?HGB 11.3* 10.9*  ?HCT 35.4* 32.0*  ?MCV 82.5  --   ?PLT 242  --   ? ?Basic Metabolic Panel: ?Recent Labs  ?Lab 02/01/22 ?1520 02/01/22 ?1830 02/02/22 ?0435  ?NA 135 137 133*  ?K 4.4 4.6 4.9  ?CL 105  --  105  ?CO2 21*  --  17*  ?GLUCOSE 159*  --  131*  ?BUN 25*  --  33*  ?CREATININE 1.99*  --  3.14*  ?CALCIUM 9.2  --  8.9  ? ?GFR: ?Estimated Creatinine Clearance: 14.6 mL/min (A) (by C-G formula based on SCr of 3.14 mg/dL (H)). ?Liver Function Tests: ?Recent Labs  ?Lab 02/01/22 ?1520  ?AST 21  ?ALT 17  ?ALKPHOS 71   ?BILITOT 0.6  ?PROT 6.8  ?ALBUMIN 4.2  ? ?No results for input(s): LIPASE, AMYLASE in the last 168 hours. ?No results for input(s): AMMONIA in the last 168 hours. ?Coagulation Profile: ?No results for input(s): INR, PROTIME in the last 168 hours. ?Cardiac Enzymes: ?No results for input(s): CKTOTAL, CKMB, CKMBINDEX, TROPONINI in the last 168 hours. ?BNP (last 3 results) ?No results for input(s): PROBNP in the last 8760 hours. ?HbA1C: ?No results for input(s): HGBA1C in the last 72 hours. ?CBG: ?No results for input(s): GLUCAP in the last 168 hours. ?Lipid Profile: ?No results for input(s): CHOL, HDL, LDLCALC, TRIG, CHOLHDL, LDLDIRECT in the last 72 hours. ?Thyroid Function Tests: ?Recent Labs  ?  02/01/22 ?1520 02/01/22 ?1522  ?TSH  --  0.225*  ?FREET4 1.34*  --   ? ?Anemia Panel: ?No results for input(s): VITAMINB12, FOLATE, FERRITIN, TIBC, IRON, RETICCTPCT in the last 72 hours. ?Urine analysis: ?   ?Component Value Date/Time  ? COLORURINE YELLOW 08/27/2021 1204  ? APPEARANCEUR CLEAR 08/27/2021 1204  ? LABSPEC >1.046 (H) 08/27/2021 1204  ? PHURINE 6.0 08/27/2021 1204  ? GLUCOSEU NEGATIVE 08/27/2021 1204  ? HGBUR NEGATIVE 08/27/2021 1204  ? [place] NEGATIVE 08/27/2021 1204  ? [place] NEGATIVE 08/27/2021 1204  ? PROTEINUR NEGATIVE 08/27/2021 1204  ? NITRITE NEGATIVE 08/27/2021 1204  ? LEUKOCYTESUR SMALL (A) 08/27/2021 1204  ? ?Sepsis Labs: ?'@LABRCNTIP'$ (procalcitonin:4,lacticidven:4) ? ?) ?Recent Results (from the past 240 hour(s))  ?Resp Panel by RT-PCR (Flu A&B, Covid) Nasopharyngeal Swab     Status: None  ? Collection Time: 02/01/22  3:21 PM  ? Specimen: Nasopharyngeal Swab; Nasopharyngeal(NP) swabs in vial transport medium  ?Result Value Ref Range Status  ? SARS Coronavirus 2 by RT PCR NEGATIVE NEGATIVE Final  ?  Comment: (NOTE) ?SARS-CoV-2 target nucleic acids are NOT DETECTED. ? ?The SARS-CoV-2 RNA is generally detectable in upper respiratory ?specimens during the acute phase of infection. The  lowest ?concentration of SARS-CoV-2 viral copies this assay can detect is ?138 copies/mL. A negative result does not preclude SARS-Cov-2 ?infection and should not be used as the sole basis for treatment or ?other patient management decisions. A negative result may occur with  ?improper specimen collection/handling, submission of specimen other ?than nasopharyngeal swab, presence of viral mutation(s) within the ?areas targeted by this assay, and inadequate number of viral ?copies(<138 copies/mL). A negative result must be combined with ?clinical observations, patient history, and epidemiological ?information. The expected result is Negative. ? ?Fact Sheet for Patients:  ?EntrepreneurPulse.com.au ? ?Fact Sheet for Healthcare Providers:  ?IncredibleEmployment.be ? ?This test is no t yet approved or cleared by the [place] FDA and  ?has been authorized for detection and/or diagnosis of SARS-CoV-2 by ?FDA under an Emergency Use Authorization (EUA). This EUA will remain  ?in effect (meaning this test can be used) for the duration of the ?COVID-19 declaration under Section 564(b)(1) of the Act, 21 ?U.S.C.section 360bbb-3(b)(1), unless the authorization is terminated  ?or revoked sooner.  ? ? ?  ? Influenza A by PCR NEGATIVE NEGATIVE Final  ? Influenza B by PCR  NEGATIVE NEGATIVE Final  ?  Comment: (NOTE) ?The Xpert Xpress SARS-CoV-2/FLU/RSV plus assay is intended as an aid ?in the diagnosis of influenza from Nasopharyngeal swab specimens and ?should not be used as a sole basis for treatment. Nasal washings and ?aspirates are unacceptable for Xpert Xpress SARS-CoV-2/FLU/RSV ?testing. ? ?Fact Sheet for Patients: ?EntrepreneurPulse.com.au ? ?Fact Sheet for Healthcare Providers: ?IncredibleEmployment.be ? ?This test is not yet approved or cleared by the [place] FDA and ?has been authorized for detection and/or diagnosis of SARS-CoV-2 by ?FDA under  an Emergency Use Authorization (EUA). This EUA will remain ?in effect (meaning this test can be used) for the duration of the ?COVID-19 declaration under Section 564(b)(1) of the Act, 21 U.S.C. ?section 360bbb-3(b)(1),

## 2022-02-02 NOTE — Consult Note (Signed)
[facility] ?Renal Consultation Note ? ?Requesting MD: Broadus John ?Indication for Consultation: AKI ? ?HPI:  ?Gwendolyn [name] is a 66 y.o. female with HTN, chronic diastolic heart failure, COPD with continued tobacco abuse.  She reported some SOB for about 2 weeks-   treated for COPD exac and bronchitis without relief, then found to have high BP-  had her labetalol and lasix increased to no avail and was sent to the ER.   CXR showed pulmonary edema and crt noted to be 1.9 compared to 0.7 in October 2022-  initial BP was 214/78-  decreased to nadir  145/73-  no UOP recorded after 40 of lasix IV-  also given steroids/levaquin.  Crt this AM is 3.14.  Renal u/s showed 10.6-11.9 cm kidneys-  normal echogenicity on right, increased on left-  nonobstructing calculi.  Renal artery duplex normal on right- non critical on left. She says her breathing is better but has made essentially no urine since admission.  She was noted to be on benicar as OP but that has been held-  her BP is 353-299 systolic on norvasc 24/ hydralazine 100 and labetalol 300/200 ? ?Creatinine, Ser  ?Date/Time Value Ref Range Status  ?02/02/2022 04:35 AM 3.14 (H) 0.44 - 1.00 mg/dL Final  ?  Comment:  ?  DELTA CHECK NOTED  ?02/01/2022 03:20 PM 1.99 (H) 0.44 - 1.00 mg/dL Final  ?08/27/2021 11:39 AM 0.70 0.44 - 1.00 mg/dL Final  ?08/15/2021 09:37 AM 0.91 0.44 - 1.00 mg/dL Final  ?08/10/2021 01:25 PM 0.66 0.44 - 1.00 mg/dL Final  ?10/26/2020 02:15 PM 0.71 0.44 - 1.00 mg/dL Final  ? ? ? ?PMHx: ?  ?Past Medical History:  ?Diagnosis Date  ? Anemia   ? Anxiety   ? Asthma   ? COPD (chronic obstructive pulmonary disease) ([place])   ? Depression   ? GERD (gastroesophageal reflux disease)   ? History of kidney stones   ? Hypertension   ? Hypothyroidism   ? ? ?Past Surgical History:  ?Procedure Laterality Date  ? ABDOMINAL HYSTERECTOMY    ? Cesarean Section    ? (2)  ? CHOLECYSTECTOMY    ? COLONOSCOPY  2020  ? Gastric Ulcer  2020  ? UMBILICAL HERNIA REPAIR  N/A 10/29/2020  ? Procedure: OPEN UMBILICAL HERNIA REPAIR WITH MESH;  Surgeon: Stechschulte, Nickola Major, MD;  Location: [facility];  Service: General;  Laterality: N/A;  ? ? ?Family Hx:  ?Family History  ?Problem Relation Age of Onset  ? Colon cancer Mother   ? Diabetes Father   ? Diabetes Sister   ? Breast cancer Sister 85  ? Hypertension Sister   ? ? ?Social History:  reports that she has been smoking cigarettes. She has a 40.00 pack-year smoking history. She has never used smokeless tobacco. She reports that she does not drink alcohol and does not use drugs. ? ?Allergies:  ?Allergies  ?Allergen Reactions  ? Doxycycline Anaphylaxis  ? Azilsartan   ?  Other reaction(s): cough  ? Biaxin [Clarithromycin] Other (See Comments)  ?  Vertigo  ? Floxacillin [Floxacillin (Flucloxacillin)] Other (See Comments)  ?  Vertigo  ? Morphine And Related Other (See Comments)  ?  Headache  ? ? ?Medications: ?Prior to Admission medications   ?Medication Sig Start Date End Date Taking? Authorizing Provider  ?amLODipine (NORVASC) 5 MG tablet Take 10 mg by mouth daily. 06/12/21  Yes [provider]  ?atorvastatin (LIPITOR) 80 MG tablet Take 80 mg by mouth daily. 09/01/21  Yes  [provider]  ?Budeson-Glycopyrrol-Formoterol (BREZTRI AEROSPHERE) 160-9-4.8 MCG/ACT AERO Inhale 2 puffs into the lungs in the morning and at bedtime.   Yes [provider]  ?furosemide (LASIX) 40 MG tablet Take 40 mg by mouth daily.   Yes [provider]  ?hydrALAZINE (APRESOLINE) 50 MG tablet TAKE 1 TABLET(50 MG) BY MOUTH THREE TIMES DAILY ?Patient taking differently: Take 50 mg by mouth 3 (three) times daily. 01/31/22  Yes Cantwell, Celeste C, PA-C  ?Iron-FA-B Cmp-C-Biot-Probiotic (FUSION PLUS) CAPS Take 1 capsule by mouth every other day.   Yes [provider]  ?labetalol (NORMODYNE) 200 MG tablet Take 1 tablet (200 mg total) by mouth 2 (two) times daily. 300 MG IN THE AM 200 MG IN THE PM ?Patient taking differently: Take 400  mg by mouth 2 (two) times daily. 01/31/22  Yes Cantwell, Celeste C, PA-C  ?levothyroxine (SYNTHROID) 137 MCG tablet Take 137 mcg by mouth daily before breakfast.   Yes [provider]  ?olmesartan (BENICAR) 40 MG tablet Take 40 mg by mouth daily. 12/06/21  Yes [provider]  ?potassium chloride (KLOR-CON) 10 MEQ tablet Take 1 tablet (10 mEq total) by mouth daily as needed. With Furosemide only 01/30/22 04/30/22 Yes Adrian Prows, MD  ?furosemide (LASIX) 20 MG tablet Take 1 tablet (20 mg total) by mouth daily as needed for fluid (shortness of breath annd 2 Lbs wieght gain). ?Patient not taking: Reported on 02/01/2022 01/30/22 04/30/22  Adrian Prows, MD  ? ? I have reviewed the patient's current medications. ? ?Labs:  ?Results for orders placed or performed during the hospital encounter of 02/01/22 (from the past 48 hour(s))  ?Comprehensive metabolic panel     Status: Abnormal  ? Collection Time: 02/01/22  3:20 PM  ?Result Value Ref Range  ? Sodium 135 135 - 145 mmol/L  ? Potassium 4.4 3.5 - 5.1 mmol/L  ? Chloride 105 98 - 111 mmol/L  ? CO2 21 (L) 22 - 32 mmol/L  ? Glucose, Bld 159 (H) 70 - 99 mg/dL  ?  Comment: Glucose reference range applies only to samples taken after fasting for at least 8 hours.  ? BUN 25 (H) 8 - 23 mg/dL  ? Creatinine, Ser 1.99 (H) 0.44 - 1.00 mg/dL  ? Calcium 9.2 8.9 - 10.3 mg/dL  ? Total Protein 6.8 6.5 - 8.1 g/dL  ? Albumin 4.2 3.5 - 5.0 g/dL  ? AST 21 15 - 41 U/L  ? ALT 17 0 - 44 U/L  ? Alkaline Phosphatase 71 38 - 126 U/L  ? Total Bilirubin 0.6 0.3 - 1.2 mg/dL  ? GFR, Estimated 27 (L) >60 mL/min  ?  Comment: (NOTE) ?Calculated using the CKD-EPI Creatinine Equation (2021) ?  ? Anion gap 9 5 - 15  ?  Comment: Performed at [facility], [place] [address] [place], [place] [address]  ?CBC WITH DIFFERENTIAL     Status: Abnormal  ? Collection Time: 02/01/22  3:20 PM  ?Result Value Ref Range  ? WBC 21.2 (H) 4.0 - 10.5 K/uL  ? RBC 4.29 3.87 - 5.11 MIL/uL  ? Hemoglobin 11.3 (L) 12.0 -  15.0 g/dL  ? HCT 35.4 (L) 36.0 - 46.0 %  ? MCV 82.5 80.0 - 100.0 fL  ? MCH 26.3 26.0 - 34.0 pg  ? MCHC 31.9 30.0 - 36.0 g/dL  ? RDW 16.9 (H) 11.5 - 15.5 %  ? Platelets 242 150 - 400 K/uL  ? nRBC 0.0 0.0 - 0.2 %  ?  Neutrophils Relative % 83 %  ? Neutro Abs 18.0 (H) 1.7 - 7.7 K/uL  ? Lymphocytes Relative 7 %  ? Lymphs Abs 1.4 0.7 - 4.0 K/uL  ? Monocytes Relative 7 %  ? Monocytes Absolute 1.4 (H) 0.1 - 1.0 K/uL  ? Eosinophils Relative 1 %  ? Eosinophils Absolute 0.1 0.0 - 0.5 K/uL  ? Basophils Relative 1 %  ? Basophils Absolute 0.2 (H) 0.0 - 0.1 K/uL  ? Immature Granulocytes 1 %  ? Abs Immature Granulocytes 0.14 (H) 0.00 - 0.07 K/uL  ?  Comment: Performed at [facility], [place] [address] [place], [place] [address]  ?Brain natriuretic peptide     Status: Abnormal  ? Collection Time: 02/01/22  3:20 PM  ?Result Value Ref Range  ? B Natriuretic Peptide 1,788.9 (H) 0.0 - 100.0 pg/mL  ?  Comment: Performed at [facility], [place] [address] [place], [place] [address]  ?T4, free     Status: Abnormal  ? Collection Time: 02/01/22  3:20 PM  ?Result Value Ref Range  ? Free T4 1.34 (H) 0.61 - 1.12 ng/dL  ?  Comment: (NOTE) ?Biotin ingestion may interfere with free T4 tests. If the results are ?inconsistent with the TSH level, previous test results, or the ?clinical presentation, then consider biotin interference. If needed, ?order repeat testing after stopping biotin. ?Performed at [facility], [place] [address], [place], [place] ?[address] ?  ?Resp Panel by RT-PCR (Flu A&B, Covid) Nasopharyngeal Swab     Status: None  ? Collection Time: 02/01/22  3:21 PM  ? Specimen: Nasopharyngeal Swab; Nasopharyngeal(NP) swabs in vial transport medium  ?Result Value Ref Range  ? SARS Coronavirus 2 by RT PCR NEGATIVE NEGATIVE  ?  Comment: (NOTE) ?SARS-CoV-2 target nucleic acids are NOT DETECTED. ? ?The SARS-CoV-2 RNA is generally detectable in upper respiratory ?specimens during the acute phase of infection. The  lowest ?concentration of SARS-CoV-2 viral copies this assay can detect is ?138 copies/mL. A negative result does not preclude SARS-Cov-2 ?infection and should not be used as the sole basis for treatment or ?other patient ma

## 2022-02-02 NOTE — Progress Notes (Signed)
?   02/02/22 2028  ?BiPAP/CPAP/SIPAP  ?BiPAP/CPAP/SIPAP Pt Type Adult  ?Mask Type Full face mask  ?Mask Size Medium  ?Set Rate 10 breaths/min  ?Respiratory Rate 26 breaths/min  ?IPAP 14 cmH20  ?EPAP 6 cmH2O  ?Oxygen Percent 40 %  ?Flow Rate 0 lpm  ?Minute Ventilation 15.5  ?Leak 0  ?Peak Inspiratory Pressure (PIP) 14  ?Tidal Volume (Vt) 600  ?BiPAP/CPAP/SIPAP BiPAP  ?Patient Home Equipment No  ?Auto Titrate No  ?Press High Alarm 25 cmH2O  ?Press Low Alarm 5 cmH2O  ?BiPAP/CPAP /SiPAP Vitals  ?Pulse Rate 97  ?Resp (!) 25  ?SpO2 100 %  ?MEWS Score/Color  ?MEWS Score 1  ?MEWS Score Color Green  ? ?Placed pt. On bipap due to shortness of breath pt. Tolerating above settings.  ?

## 2022-02-02 NOTE — Progress Notes (Signed)
Heart Failure Stewardship Pharmacist Progress Note ? ? ?PCP: Jilda Panda, MD ?PCP-Cardiologist: None  ? ? ?HPI:  ?66 yo F with PMH of COPD, tobacco abuse, HTN, aortic stenosis, and CHF. She presented to the ED on 3/28 with shortness of breath and orthopnea. CXR with cardiomegaly and pulmonary vascular congestion. Her last ECHO from 09/2021 with LVEF of 55-60% and G2DD. ? ?Current HF Medications: ?Other: hydralazine 100 mg TID; Imdur 30 mg daily ? ?Prior to admission HF Medications: ?Diuretic: furosemide 40 mg daily ?ACE/ARB/ARNI: olmesartan 40 mg daily ?Other: hydralazine 50 mg TID ? ?Pertinent Lab Values: ?Serum creatinine 3.14, BUN 33, Potassium 4.9, Sodium 133, BNP 1788.9  ? ?Vital Signs: ?Weight: 142 lbs (admission weight: 142 lbs) ?Blood pressure: 150/50s  ?Heart rate: 80s  ?I/O: not documented ? ?Medication Assistance / Insurance Benefits Check: ?Does the patient have prescription insurance?  Yes ?Type of insurance plan: [facility] Medicare ? ?Outpatient Pharmacy:  ?Prior to admission outpatient pharmacy: [facility] ?Is the patient willing to use [facility] pharmacy at discharge? Yes ?Is the patient willing to transition their outpatient pharmacy to utilize a [facility] outpatient pharmacy?   Pending ?  ? ?Assessment: ?1. Acute on chronic diastolic CHF (EF 93-79%), due to presumed NICM - tobacco and uncontrolled HTN. NYHA class III symptoms. ?- GDMT limited with AKI - SCr 3.14 (baseline <1). Holding further IV lasix today pending renal consult ?- Continue hydralazine 100 mg TID and Imdur 30 mg daily ?  ?Plan: ?1) Medication changes recommended at this time: ?-  GDMT limited with AKI ? ?2) Patient assistance: ?- Lisabeth Register, Jardiance all $47 copay ? ?3)  Education  ?- To be completed prior to discharge ? ?Kerby Nora, PharmD, BCPS ?Heart Failure Stewardship Pharmacist ?Phone [phone] ? ? ?

## 2022-02-02 NOTE — Progress Notes (Signed)
Initial Nutrition Assessment ? ?DOCUMENTATION CODES:  ? ?Not applicable ? ?INTERVENTION:  ? ?Multivitamin w/ minerals daily ?Boost Breeze po TID, each supplement provides 250 kcal and 9 grams of protein ? ?NUTRITION DIAGNOSIS:  ? ?Inadequate oral intake related to nausea as evidenced by per patient/family report. ? ?GOAL:  ? ?Patient will meet greater than or equal to 90% of their needs ? ?MONITOR:  ? ?PO intake, Supplement acceptance, Labs, Weight trends ? ?REASON FOR ASSESSMENT:  ? ?Consult ?Assessment of nutrition requirement/status ? ?ASSESSMENT:  ? ?66 y.o. female presented to the ED with increased cough and shortness of breath. PMH includes COPD, CHF, and HTN. Pt admitted with CHF exacerbation, COPD exacerbation, and AKI.  ? ?Pt sitting in chair. Reports that she had just finished lunch. Reports that she had nausea and vomiting this morning, was given medicine that helped.  ? ?Pt reports that her appetite has been off and on for the past month. Reports that she has had nausea for the past month also impacting her eating due to a change in her medicine. Pt reports a typical intake of: ?Breakfast: Bagel ?Lunch: mashed potatoes or pasta salad ?Dinner: Soup  ?Pt reports that she also eats items such as applesauce and yogurt. ? ?Pt reports that her UBW is in the 150-160# range and that she had lost 20# within the past month. Pt denies any assistance with ambulating. Per EMR, pt has had a 16% weight loss in 5 months, this is clinically significant for time frame. Although, unable to determine if weight loss is related to fluid loss versus actual dry weight loss.  ? ?Discussed ONS with pt. Pt reports that she stays away from milky products. Discussed the use of Boost Breeze that is not milky, pt agreeable to try.  ? ?Medications reviewed and include: Prednisone ?Labs reviewed: Sodium 133, BUN 33, Creatinine 3.14 ? ?NUTRITION - FOCUSED PHYSICAL EXAM: ? ?Flowsheet Row Most Recent Value  ?Orbital Region No depletion   ?Upper Arm Region No depletion  ?Thoracic and Lumbar Region No depletion  ?Buccal Region No depletion  ?Temple Region No depletion  ?Clavicle Bone Region No depletion  ?Clavicle and Acromion Bone Region No depletion  ?Scapular Bone Region No depletion  ?Dorsal Hand No depletion  ?Patellar Region No depletion  ?Anterior Thigh Region No depletion  ?Posterior Calf Region No depletion  ?Edema (RD Assessment) None  ?Hair Reviewed  ?Eyes Reviewed  ?Mouth Reviewed  ?Skin Reviewed  ?Nails Reviewed  ? ?  ? ? ?Diet Order:   ?Diet Order   ? ?       ?  Diet Heart Room service appropriate? Yes; Fluid consistency: Thin; Fluid restriction: 2000 mL Fluid  Diet effective now       ?  ? ?  ?  ? ?  ? ? ?EDUCATION NEEDS:  ? ?No education needs have been identified at this time ? ?Skin:  Skin Assessment: Reviewed RN Assessment ? ?Last BM:  3/28 ? ?Height:  ?Ht Readings from Last 1 Encounters:  ?02/02/22 4' 11.5" (1.511 m)  ? ?Weight:  ?Wt Readings from Last 1 Encounters:  ?02/02/22 64.4 kg  ? ?BMI:  Body mass index is 28.2 kg/m?. ? ?Estimated Nutritional Needs:  ? ?Kcal:  1700-1900 ? ?Protein:  85-100 grams ? ?Fluid:  >/= 1.7 L ? ? ? ?Hermina Barters RD, LDN ?Clinical Dietitian ?See AMiON for contact information.  ? ?

## 2022-02-02 NOTE — TOC Progression Note (Signed)
Transition of Care (TOC) - Progression Note  ? ? ?Patient Details  ?Name: Gwendolyn [name] ?MRN: [id number] ?Date of Birth: [birth year] ? ?Transition of Care (TOC) CM/SW Contact  ?Zenon Mayo, RN ?Phone Number: ?02/02/2022, 4:15 PM ? ?Clinical Narrative:    ?from home, indep, chf ex, AKI, for renal ultrasound, will be here 3 to 4 days til cret improves.  TOC will continue to follow for dc needs.  ? ? ?  ?  ? ?Expected Discharge Plan and Services ?  ?  ?  ?  ?  ?                ?  ?  ?  ?  ?  ?  ?  ?  ?  ?  ? ? ?Social Determinants of Health (SDOH) Interventions ?Food Insecurity Interventions: Intervention Not Indicated ?Financial Strain Interventions: Intervention Not Indicated ?Housing Interventions: Intervention Not Indicated ?Transportation Interventions: Intervention Not Indicated ? ?Readmission Risk Interventions ?   ? View : No data to display.  ?  ?  ?  ? ? ?

## 2022-02-02 NOTE — Progress Notes (Signed)
Subjective:  ?Patient seen and examined at approximately 9:00 AM  ?Patient resting in bed complaining of nausea with single episode of emesis.  ?Reports mild improvement of dyspnea today. Continues to have orthopnea. Presently tolerating room air.  ? ?Intake/Output from previous day: ? ?I/O last 3 completed shifts: ?In: 120 [P.O.:120] ?Out: 0  ?No intake/output data recorded. ? ?Blood pressure (!) 150/54, pulse 88, temperature 98.1 ?F (36.7 ?C), temperature source Oral, resp. rate 20, height 4' 11.5" (1.511 m), weight 64.4 kg, SpO2 98 %. ?Physical Exam ?Vitals and nursing note reviewed.  ?Cardiovascular:  ?   Rate and Rhythm: Normal rate and regular rhythm.  ?   Pulses:     ?     Carotid pulses are  on the right side with bruit and  on the left side with bruit. ?     Dorsalis pedis pulses are 0 on the right side and 0 on the left side.  ?     Posterior tibial pulses are 1+ on the right side and 1+ on the left side.  ?   Heart sounds: S1 normal and S2 normal. No murmur heard. ?  No gallop.  ?Pulmonary:  ?   Effort: Pulmonary effort is normal. No respiratory distress.  ?   Breath sounds: Decreased air movement (bilaterally) present. Wheezing present.  ?Musculoskeletal:  ?   Right lower leg: No edema.  ?   Left lower leg: No edema.  ?Neurological:  ?   Mental Status: She is alert.  ? ?Lab Results: ?BMP ?BNP (last 3 results) ?Recent Labs  ?  08/10/21 ?1325 02/01/22 ?1520  ?BNP 147.8* 1,788.9*  ? ? ?ProBNP (last 3 results) ?No results for input(s): PROBNP in the last 8760 hours. ? ?  Latest Ref Rng & Units 02/02/2022  ?  4:35 AM 02/01/2022  ?  6:30 PM 02/01/2022  ?  3:20 PM  ?BMP  ?Glucose 70 - 99 mg/dL 131    159    ?BUN 8 - 23 mg/dL 33    25    ?Creatinine 0.44 - 1.00 mg/dL 3.14    1.99    ?Sodium 135 - 145 mmol/L 133   137   135    ?Potassium 3.5 - 5.1 mmol/L 4.9   4.6   4.4    ?Chloride 98 - 111 mmol/L 105    105    ?CO2 22 - 32 mmol/L 17    21    ?Calcium 8.9 - 10.3 mg/dL 8.9    9.2    ? ? ?  Latest Ref Rng & Units  02/01/2022  ?  3:20 PM 08/27/2021  ? 11:39 AM 08/10/2021  ?  1:25 PM  ?Hepatic Function  ?Total Protein 6.5 - 8.1 g/dL 6.8   6.5   7.6    ?Albumin 3.5 - 5.0 g/dL 4.2   4.0   4.5    ?AST 15 - 41 U/L $Remo'21   20   20    'junOX$ ?ALT 0 - 44 U/L $Remo'17   21   20    'vJNOI$ ?Alk Phosphatase 38 - 126 U/L 71   60   60    ?Total Bilirubin 0.3 - 1.2 mg/dL 0.6   0.4   0.3    ? ? ?  Latest Ref Rng & Units 02/01/2022  ?  6:30 PM 02/01/2022  ?  3:20 PM 08/27/2021  ? 11:39 AM  ?CBC  ?WBC 4.0 - 10.5 K/uL  21.2   10.0    ?  Hemoglobin 12.0 - 15.0 g/dL 10.9   11.3   8.7    ?Hematocrit 36.0 - 46.0 % 32.0   35.4   30.5    ?Platelets 150 - 400 K/uL  242   302    ? ?Lipid Panel  ?No results found for: CHOL, TRIG, HDL, CHOLHDL, VLDL, LDLCALC, LDLDIRECT ?Cardiac Panel (last 3 results) ?No results for input(s): CKTOTAL, CKMB, TROPONINI, RELINDX in the last 72 hours. ? ?HEMOGLOBIN A1C ?No results found for: HGBA1C, MPG ?TSH ?Recent Labs  ?  08/27/21 ?1351 02/01/22 ?1522  ?TSH 0.706 0.225*  ? ?Imaging: ?Chest x-ray 02/02/2022:  ?Interval improved pulmonary vascular congestion. Small bilateral ?pleural effusions. ? ?CT abdomen/pelvis 08/27/2021:  ?1. Very severe calcified atherosclerosis of the abdominal aorta with ?NEAR AORTIC OCCLUSION at the SMA level. ?Associated High-grade Stenosis of nearly all major aortic branch ?origins. And high-grade stenosis of the left Common iliac artery ?also due to bulky calcified plaque. ?Recommend Vascular Surgery Consultation. Aortic Atherosclerosis ?(ICD10-I70.0). ?  ?2. Superimposed mild inflammatory stranding in the right upper ?quadrant, near the porta hepatis and hepatic flexure of colon. This ?might be a mild Acute Colitis - perhaps a low-grade ischemic colitis ?in light of #1, uncertain. ?  ?3. Trace bilateral pleural effusions, larger on the right. Cardiac ?enlargement since 2008, borderline cardiomegaly with trace ?pericardial fluid. ?  ?4. No other acute or inflammatory process identified in the abdomen ?or pelvis. ?Bilateral  nephrolithiasis with developing staghorn calculus in the ?right lower pole. ?Chronic postinflammatory calcification of upper mesenteric lymph ?nodes, an area affected by acute pancreatitis in 2008. ? ?Renal ultrasound 02/01/2022:  ?1. Increased echogenicity in the left kidney, as can be seen with ?medical renal disease. ?2. Nonobstructing calculi in the bilateral kidneys, measuring up to ?1.8 cm on the right. ? ?Cardiac Studies: ?Renal artery duplex 02/02/2022:  ?Right: Normal right Resisitive Index. No evidence of right renal  ?       artery stenosis. RRV flow present. Normal size right kidney.  ?Left:  Normal left Resistive Index. 1-59% stenosis of the left renal  ?       artery. LRV flow present. Normal size of left kidney.  ? ?Echocardiogram 02/01/2022: ?Study interrupted due to severe shortness of breath. ?Limited images in parasternal long axis show ejection fraction around 45-50%. ?  ?Echocardiogram 09/09/2021:  ?Normal LV systolic function with visual EF 55-60%. Left ventricle cavity  ?is normal in size. Mild left ventricular hypertrophy. Normal global wall  ?motion. Doppler evidence of grade II diastolic dysfunction, elevated LAP.  ?Left atrial cavity is severely dilated.  ?Aortic valve sclerosis without stenosis.  ?Mild (Grade I) aortic regurgitation.  ?Mild (Grade I) mitral regurgitation.  ?Mild tricuspid regurgitation. No evidence of pulmonary hypertension.  ?Small circumferential pericardial effusion. There is no hemodynamic  ?significance.  ?IVC is dilated with a respiratory response of >50%.  ?No prior study for comparison. ?  ?EKG 02/01/2022:: ?Sinus rhythm 90 bpm ?Left atrial enlargement ?LVH ?Possible old anterior infarct ? ?Telemetry:  ?No significant arrhythmias  ? ?No results found for this or any previous visit (from the past 43800 hour(s)). ? ?Scheduled Meds: ? amLODipine  10 mg Oral Daily  ? atorvastatin  80 mg Oral QHS  ? enoxaparin (LOVENOX) injection  30 mg Subcutaneous Q24H  ? fluticasone  furoate-vilanterol  1 puff Inhalation Daily  ? hydrALAZINE  100 mg Oral Q8H  ? ipratropium-albuterol  3 mL Nebulization Q6H  ? isosorbide mononitrate  30 mg Oral Daily  ? labetalol  200 mg Oral QHS  ? labetalol  300 mg Oral Daily  ? [START ON 02/03/2022] levofloxacin  750 mg Oral Q48H  ? levothyroxine  100 mcg Oral QAC breakfast  ? nicotine  21 mg Transdermal Daily  ? predniSONE  40 mg Oral Q breakfast  ? sodium chloride flush  3 mL Intravenous Q12H  ? umeclidinium bromide  1 puff Inhalation Daily  ? ?Continuous Infusions: ? sodium chloride    ? ?PRN Meds:.sodium chloride, acetaminophen, albuterol, ondansetron (ZOFRAN) IV, sodium chloride flush ? ?Assessment/Plan:  ?Acute hypoxic respiratory failure: ?Likely multifactorial, including hypertensive emergency, COPD exacerbation, as well as possible acute on chronic HFpEF.  ?On BiPAP at night. Presently tolerating room air ?Dyspnea improving ?Chest x-ray with improvement of vascular congestion ?  ?HFpEF: ?Echocardiogram in 09/2021 shows normal EF, grade 2 diastolic dysfunction. ?Will obtain repeat echocardiogram  ?BNP 1788 ?Patient has received 2 doses of Lasix 40 mg IV ?Suspect INO's are not documented accurately in the chart at this time. ?Patient's creatinine continues to increase from 1.99 yesterday to 3.14 today. Baseline approximately 0.7  ?Hold diuretics. Agree with and appreciate nephrology consultation  ?  ?Hypertensive emergency: ?Likely due to underlying severe stenosis of the distal aorta  ?Patient's renal artery duplex also shows evidence of left renal artery stenosis ?Blood pressure is improved compared to presentation ?Patient will receive first dose of amlodipine today ?Continue hydralazine 100 mg p.o. 3 times daily ?Patient remains on labetalol 200 mg p.o. daily at this time, although must consider that this may be contributing to bronchospasm. Will continue for now at she is improving.  ? ?High-grade stenosis of distal aorta:  ?Likely underlying  etiology of resistant hypertension and hypertensive emergency at presentation  ?Continue statin therapy  ?Recommend smoking cessation  ?Evaluated by Dr. Deitra Mayo in 08/2021 when finding was seen on CT

## 2022-02-02 NOTE — Progress Notes (Signed)
Heart Failure Nurse Navigator Progress Note ? ?PCP: Jilda Panda, MD ?PCP-Cardiologist: Einar Gip ?Admission Diagnosis:  ?Admitted from: Office ? ?Presentation:   ?Gwendolyn [name] presented with Shortness of breath last 2 weeks, at MD office, attempting ECHO and became very SOB, has had multiple medicine changes, requiring O2 on admission. IV lasix given, noted to have concern for heart failure exacerbation noted likely exacerbated by her COPD.  ?Spoke with patient she states she takes her medications as prescribed, recently she has had a number of medication changes, her shortness of breath started about 2 weeks following a course of antibiotics, she states she has lost weight due to having diarrhea from the antibiotics. Patient drinks 1 soda a day and watches how much she drinks, cooks at home and doesn't use salt to cook.She did state that she does not weigh herself and hasn't, but will start weighing in daily . Education was completed and verbally understood the importance of the sign and symptoms of heart failure and when to call her doctor.  ?Patient was offered a visit to [facility]  ( 02/14/22) to optimize her medication and a hospital follow up, prior to seeing [facility] on 03/03/22.   ? ? ?ECHO/ LVEF: 55-60% ? ?Clinical Course: ? ?Past Medical History:  ?Diagnosis Date  ? Anemia   ? Anxiety   ? Asthma   ? COPD (chronic obstructive pulmonary disease) ([place])   ? Depression   ? GERD (gastroesophageal reflux disease)   ? History of kidney stones   ? Hypertension   ? Hypothyroidism   ?  ? ?Social History  ? ?Socioeconomic History  ? Marital status: Widowed  ?  Spouse name: Not on file  ? Number of children: 2  ? Years of education: Not on file  ? Highest education level: Associate degree: academic program  ?Occupational History  ? 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  Comment: full time  ?Tobacco Use  ? Smoking status: Every Day  ?  Packs/day: 1.00  ?  Years: 40.00  ?  Pack years: 40.00  ?  Types: Cigarettes  ? Smokeless  tobacco: Never  ?Vaping Use  ? Vaping Use: Never used  ?Substance and Sexual Activity  ? Alcohol use: Never  ? Drug use: Never  ? Sexual activity: Not on file  ?  Comment: hysterectomy  ?Other Topics Concern  ? Not on file  ?Social History Narrative  ? Not on file  ? ?Social Determinants of Health  ? ?Financial Resource Strain: Low Risk   ? Difficulty of Paying Living Expenses: Not hard at all  ?Food Insecurity: No Food Insecurity  ? Worried About Charity fundraiser in the Last Year: Never true  ? Ran Out of Food in the Last Year: Never true  ?Transportation Needs: No Transportation Needs  ? Lack of Transportation (Medical): No  ? Lack of Transportation (Non-Medical): No  ?Physical Activity: Not on file  ?Stress: Not on file  ?Social Connections: Not on file  ? ? ?High Risk Criteria for Readmission and/or Poor Patient Outcomes: ?Heart failure hospital admissions (last 6 months):  1 ?No Show rate: NA ?Difficult social situation: No ?Demonstrates medication adherence: Yes ?Primary Language: English ?Literacy level: Reading, writing, and comprehension ? ?Barriers of Care:   ?NA ? ?Considerations/Referrals:  ? ?Referral made to Heart Failure Pharmacist Stewardship: yes ?Referral made to Heart Failure CSW/NCM TOC: No ?Referral made to Heart & Vascular TOC clinic: Yes, 02/14/22 ? ?Items for Follow-up on DC/TOC: ?Optimize ?Had #  of med changes ? ? ?Earnestine Leys, BSN, RN ?Heart Failure Nurse Navigator ?[phone]   ?

## 2022-02-02 NOTE — Progress Notes (Signed)
OT Screen Note ? ?Patient Details ?Name: Gwendolyn [name] ?MRN: [id number] ?DOB: 1956-04-28 ? ? ?Screened Evaluation: Patient working with PT, PT stating no OT needs. Patient is independent in ADLs, walking without assistive device or oxygen, and still works full time. Please re-consult if other needs arise.  ? ?Gwendolyn Ports E. Ervin Hensley, OTR/L ?Acute Rehabilitation Services ?[phone] ?[phone]      ? ?Gwendolyn [name] ?02/02/2022, 8:55 AM ?

## 2022-02-02 NOTE — Evaluation (Signed)
Physical Therapy Evaluation ?Patient Details ?Name: Gwendolyn [name] ?MRN: [id number] ?DOB: 1956/05/01 ?Today's Date: 02/02/2022 ? ?History of Present Illness ? The pt is a 66 yo female presenting 3/28 with SOB with hypoxia on RA (SpO2 82%). Pt found to have CHF exacerbation and AKI. PMH includes: HTN, HLD, current tobacco use, severe aortic stenosis, COPD, HFpEF, and asthma. ?  ?Clinical Impression ? Pt in bed upon arrival of PT, agreeable to evaluation at this time. 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 The pt now presents with limitations in functional mobility, endurance, and dynamic stability due to above dx, and will continue to benefit from skilled PT to address these deficits. The pt was able to complete sit-stand transfers and short bout of mobility without physical assist to power up, but did need intermittent assist to steady during gait. She preferred at least single UE support at this time for balance and had multiple scissoring steps and x2 LOB with gait. SpO2 remained >96% on RA with all mobility, and was left on RA at end of session. Will continue to benefit from skilled PT acutely to progress endurance, stability, and establish home walking program for continued endurance training. Suspect she will progress well and will be safe to d/c home without follow up therapies once medically stable.  ?   ?   ? ?Recommendations for follow up therapy are one component of a multi-disciplinary discharge planning process, led by the attending physician.  Recommendations may be updated based on patient status, additional functional criteria and insurance authorization. ? ?Follow Up Recommendations No PT follow up ? ?  ?Assistance Recommended at Discharge PRN  ?Patient can return home with the following ? Help with stairs or ramp for entrance ? ?  ?Equipment  Recommendations None recommended by PT  ?Recommendations for Other Services ?    ?  ?Functional Status Assessment Patient has had a recent decline in their functional status and demonstrates the ability to make significant improvements in function in a reasonable and predictable amount of time.  ? ?  ?Precautions / Restrictions Precautions ?Precautions: Fall ?Precaution Comments: on RA through session, watch SpO2 ?Restrictions ?Weight Bearing Restrictions: No  ? ?  ? ?Mobility ? Bed Mobility ?Overal bed mobility: Independent ?  ?  ?  ?  ?  ?  ?  ?  ? ?Transfers ?Overall transfer level: Needs assistance ?Equipment used: None ?Transfers: Sit to/from Stand ?Sit to Stand: Min guard ?  ?  ?  ?  ?  ?General transfer comment: pt reaching for single UE support with initial stand ?  ? ?Ambulation/Gait ?Ambulation/Gait assistance: Min assist ?Gait Distance (Feet): 30 Feet ?Assistive device: None ?Gait Pattern/deviations: Step-through pattern, Decreased stride length, Scissoring, Narrow base of support ?Gait velocity: decreased ?Gait velocity interpretation: <1.31 ft/sec, indicative of household ambulator ?  ?General Gait Details: pt with intermittent minor LOB she was able to correct on her own. x3 scissoring steps and x2 minor LOB. SpO2 96% on RA with gait ? ? ?  ? ?Balance Overall balance assessment: Needs assistance ?Sitting-balance support: No upper extremity supported, Feet supported ?Sitting balance-Leahy Scale: Good ?  ?  ?Standing balance support: Single extremity supported, During functional activity ?Standing balance-Leahy Scale: Poor ?  ?  ?  ?  ?  ?  ?  ?  ?  ?  ?  ?  ?   ? ? ? ?  Pertinent Vitals/Pain Pain Assessment ?Pain Assessment: No/denies pain  ? ? ?Home Living Family/patient expects to be discharged to:: Private residence ?Living Arrangements: Children ?Available Help at Discharge: Family;Available 24 hours/day ?Type of Home: House ?Home Access: Stairs to enter ?Entrance Stairs-Rails: Right;Left ?Entrance  Stairs-Number of Steps: 3 ?  ?Home Layout: One level ?Home Equipment: None ?   ?  ?Prior Function Prior Level of Function : Independent/Modified Independent;Working/employed;Driving ?  ?  ?  ?  ?  ?  ?  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  ? ? ?Hand Dominance  ? Dominant Hand: Right ? ?  ?Extremity/Trunk Assessment  ? Upper Extremity Assessment ?Upper Extremity Assessment: Overall WFL for tasks assessed ?  ? ?Lower Extremity Assessment ?Lower Extremity Assessment: Overall WFL for tasks assessed ?  ? ?Cervical / Trunk Assessment ?Cervical / Trunk Assessment: Normal  ?Communication  ? Communication: No difficulties  ?Cognition Arousal/Alertness: Awake/alert ?Behavior During Therapy: [facility] for tasks assessed/performed ?Overall Cognitive Status: Within Functional Limits for tasks assessed ?  ?  ?  ?  ?  ?  ?  ?  ?  ?  ?  ?  ?  ?  ?  ?  ?  ?  ?  ? ?  ?General Comments General comments (skin integrity, edema, etc.): SpO2 100% on 3L upon my arrival, maintained 96% on RA with mobility ? ?  ?Exercises    ? ?Assessment/Plan  ?  ?PT Assessment Patient needs continued PT services  ?PT Problem List Decreased activity tolerance;Decreased balance;Decreased mobility ? ?   ?  ?PT Treatment Interventions DME instruction;Gait training;Stair training;Functional mobility training;Therapeutic activities;Therapeutic exercise;Balance training;Patient/family education   ? ?PT Goals (Current goals can be found in the Care Plan section)  ?Acute Rehab PT Goals ?Patient Stated Goal: return home and to work ?PT Goal Formulation: With patient ?Time For Goal Achievement: 02/16/22 ?Potential to Achieve Goals: Good ? ?  ?Frequency Min 3X/week ?  ? ? ?   ?AM-PAC PT "6 Clicks" Mobility  ?Outcome Measure Help needed turning from your back to your side while in a flat bed without using bedrails?: None ?Help needed moving from lying on your back to sitting on the side of  a flat bed without using bedrails?: None ?Help needed moving to and from a bed to a chair (including a wheelchair)?: A Little ?Help needed standing up from a chair using your arms (e.g., wheelchair or bedside chair)?: A Little ?Help needed to walk in hospital room?: A Little ?Help needed climbing 3-5 steps with a railing? : A Little ?6 Click Score: 20 ? ?  ?End of Session Equipment Utilized During Treatment: Oxygen ?Activity Tolerance: Patient tolerated treatment well ?Patient left: in bed (with transport) ?Nurse Communication: Mobility status (SpO2 96% on RA) ?PT Visit Diagnosis: Unsteadiness on feet (R26.81) ?  ? ?Time: 7673-4193 ?PT Time Calculation (min) (ACUTE ONLY): 13 min ? ? ?Charges:   PT Evaluation ?$PT Eval Low Complexity: 1 Low ?  ?  ?   ? ? ?West Carbo, PT, DPT  ? ?Acute Rehabilitation Department ?Pager #: [phone] ? ?Gwendolyn [name] ?02/02/2022, 8:35 AM ? ?

## 2022-02-02 NOTE — Progress Notes (Signed)
Heart Failure Navigator Progress Note ? ?Following this hospitalization to assess for HV TOC readiness.  ? ?Will plan to follow, and interview.  ? ? ?Earnestine Leys, BSN, RN ?Heart Failure Nurse Navigator ?[phone]  ?

## 2022-02-03 ENCOUNTER — Ambulatory Visit: Payer: Medicare Other | Admitting: Student

## 2022-02-03 ENCOUNTER — Inpatient Hospital Stay (HOSPITAL_COMMUNITY): Payer: Medicare Other

## 2022-02-03 ENCOUNTER — Encounter (HOSPITAL_COMMUNITY)
Admission: EM | Disposition: A | Discharge status: Home / Self Care | Payer: Self-pay | Source: Home / Self Care | Attending: Internal Medicine

## 2022-02-03 DIAGNOSIS — J441 Chronic obstructive pulmonary disease with (acute) exacerbation: Secondary | ICD-10-CM | POA: Diagnosis not present

## 2022-02-03 DIAGNOSIS — R0603 Acute respiratory distress: Secondary | ICD-10-CM | POA: Diagnosis not present

## 2022-02-03 DIAGNOSIS — I5033 Acute on chronic diastolic (congestive) heart failure: Secondary | ICD-10-CM | POA: Diagnosis not present

## 2022-02-03 DIAGNOSIS — Q251 Coarctation of aorta: Secondary | ICD-10-CM

## 2022-02-03 DIAGNOSIS — I739 Peripheral vascular disease, unspecified: Secondary | ICD-10-CM

## 2022-02-03 DIAGNOSIS — I701 Atherosclerosis of renal artery: Secondary | ICD-10-CM

## 2022-02-03 DIAGNOSIS — N179 Acute kidney failure, unspecified: Secondary | ICD-10-CM

## 2022-02-03 DIAGNOSIS — Z8679 Personal history of other diseases of the circulatory system: Secondary | ICD-10-CM

## 2022-02-03 HISTORY — PX: RENAL ANGIOGRAPHY: CATH118260

## 2022-02-03 HISTORY — PX: PERIPHERAL VASCULAR BALLOON ANGIOPLASTY: CATH118281

## 2022-02-03 HISTORY — PX: ABDOMINAL AORTOGRAM W/LOWER EXTREMITY: CATH118223

## 2022-02-03 LAB — BASIC METABOLIC PANEL
Anion gap: 13 (ref 5–15)
BUN: 54 mg/dL — ABNORMAL HIGH (ref 8–23)
CO2: 19 mmol/L — ABNORMAL LOW (ref 22–32)
Calcium: 8.8 mg/dL — ABNORMAL LOW (ref 8.9–10.3)
Chloride: 99 mmol/L (ref 98–111)
Creatinine, Ser: 5.84 mg/dL — ABNORMAL HIGH (ref 0.44–1.00)
GFR, Estimated: 7 mL/min — ABNORMAL LOW (ref >60)
Glucose, Bld: 107 mg/dL — ABNORMAL HIGH (ref 70–99)
Potassium: 4.9 mmol/L (ref 3.5–5.1)
Sodium: 131 mmol/L — ABNORMAL LOW (ref 135–145)

## 2022-02-03 LAB — CBC
HCT: 28.1 % — ABNORMAL LOW (ref 36.0–46.0)
Hemoglobin: 9.2 g/dL — ABNORMAL LOW (ref 12.0–15.0)
MCH: 26 pg (ref 26.0–34.0)
MCHC: 32.7 g/dL (ref 30.0–36.0)
MCV: 79.4 fL — ABNORMAL LOW (ref 80.0–100.0)
Platelets: 217 10*3/uL (ref 150–400)
RBC: 3.54 MIL/uL — ABNORMAL LOW (ref 3.87–5.11)
RDW: 17.3 % — ABNORMAL HIGH (ref 11.5–15.5)
WBC: 16 10*3/uL — ABNORMAL HIGH (ref 4.0–10.5)
nRBC: 0 % (ref 0.0–0.2)

## 2022-02-03 LAB — POCT ACTIVATED CLOTTING TIME
Activated Clotting Time: 191 seconds
Activated Clotting Time: 221 seconds
Activated Clotting Time: 233 seconds

## 2022-02-03 LAB — PROCALCITONIN: Procalcitonin: 2.74 ng/mL

## 2022-02-03 SURGERY — ABDOMINAL AORTOGRAM W/LOWER EXTREMITY
Anesthesia: LOCAL

## 2022-02-03 MED ORDER — CLOPIDOGREL BISULFATE 300 MG PO TABS
ORAL_TABLET | ORAL | Status: DC | PRN
  Administered 2022-02-03: 600 mg via ORAL

## 2022-02-03 MED ORDER — HEPARIN (PORCINE) IN NACL 1000-0.9 UT/500ML-% IV SOLN
INTRAVENOUS | Status: DC | PRN
  Administered 2022-02-03: 500 mL

## 2022-02-03 MED ORDER — FENTANYL CITRATE (PF) 100 MCG/2ML IJ SOLN
INTRAMUSCULAR | Status: DC | PRN
  Administered 2022-02-03 (×2): 50 ug via INTRAVENOUS

## 2022-02-03 MED ORDER — LIDOCAINE HCL (PF) 1 % IJ SOLN
INTRAMUSCULAR | Status: DC | PRN
  Administered 2022-02-03: 20 mL

## 2022-02-03 MED ORDER — CLOPIDOGREL BISULFATE 300 MG PO TABS
ORAL_TABLET | ORAL | Status: AC
  Filled 2022-02-03: qty 2

## 2022-02-03 MED ORDER — SODIUM CHLORIDE 0.9% FLUSH
3.0000 mL | Freq: Two times a day (BID) | INTRAVENOUS | Status: DC
  Administered 2022-02-04 – 2022-02-05 (×2): 3 mL via INTRAVENOUS

## 2022-02-03 MED ORDER — SIMETHICONE 80 MG PO CHEW
80.0000 mg | CHEWABLE_TABLET | Freq: Four times a day (QID) | ORAL | Status: DC | PRN
  Administered 2022-02-03: 80 mg via ORAL
  Filled 2022-02-03: qty 1

## 2022-02-03 MED ORDER — HEPARIN SODIUM (PORCINE) 1000 UNIT/ML IJ SOLN
INTRAMUSCULAR | Status: AC
  Filled 2022-02-03: qty 10

## 2022-02-03 MED ORDER — FAMOTIDINE IN NACL 20-0.9 MG/50ML-% IV SOLN
INTRAVENOUS | Status: AC | PRN
  Administered 2022-02-03: 20 mg via INTRAVENOUS

## 2022-02-03 MED ORDER — FAMOTIDINE IN NACL 20-0.9 MG/50ML-% IV SOLN
INTRAVENOUS | Status: AC
  Filled 2022-02-03: qty 50

## 2022-02-03 MED ORDER — SODIUM CHLORIDE 0.9 % IV SOLN: INTRAVENOUS | Status: DC

## 2022-02-03 MED ORDER — HYDRALAZINE HCL 20 MG/ML IJ SOLN: 10.0000 mg | INTRAMUSCULAR | Status: DC | PRN

## 2022-02-03 MED ORDER — FENTANYL CITRATE (PF) 100 MCG/2ML IJ SOLN
INTRAMUSCULAR | Status: AC
  Filled 2022-02-03: qty 2

## 2022-02-03 MED ORDER — LIDOCAINE HCL (PF) 1 % IJ SOLN
INTRAMUSCULAR | Status: AC
  Filled 2022-02-03: qty 30

## 2022-02-03 MED ORDER — HEPARIN SODIUM (PORCINE) 1000 UNIT/ML IJ SOLN
INTRAMUSCULAR | Status: DC | PRN
  Administered 2022-02-03: 6000 [IU] via INTRAVENOUS

## 2022-02-03 MED ORDER — ONDANSETRON HCL 4 MG/2ML IJ SOLN
INTRAMUSCULAR | Status: AC
  Filled 2022-02-03: qty 2

## 2022-02-03 MED ORDER — CLOPIDOGREL BISULFATE 75 MG PO TABS
75.0000 mg | ORAL_TABLET | Freq: Every day | ORAL | Status: DC
  Administered 2022-02-04 – 2022-02-07 (×4): 75 mg via ORAL
  Filled 2022-02-03 (×4): qty 1

## 2022-02-03 MED ORDER — SODIUM CHLORIDE 0.9% FLUSH
3.0000 mL | Freq: Two times a day (BID) | INTRAVENOUS | Status: DC
  Administered 2022-02-03 – 2022-02-06 (×5): 3 mL via INTRAVENOUS

## 2022-02-03 MED ORDER — ONDANSETRON HCL 4 MG/2ML IJ SOLN
INTRAMUSCULAR | Status: DC | PRN
Start: 2022-02-03 — End: 2022-02-03
  Administered 2022-02-03: 4 mg via INTRAVENOUS

## 2022-02-03 MED ORDER — FUROSEMIDE 10 MG/ML IJ SOLN
160.0000 mg | Freq: Once | INTRAVENOUS | Status: AC
  Administered 2022-02-03: 160 mg via INTRAVENOUS
  Filled 2022-02-03: qty 16

## 2022-02-03 MED ORDER — SODIUM CHLORIDE 0.9% FLUSH
3.0000 mL | INTRAVENOUS | Status: DC | PRN
Start: 2022-02-03 — End: 2022-02-07

## 2022-02-03 MED ORDER — HEPARIN (PORCINE) IN NACL 1000-0.9 UT/500ML-% IV SOLN
INTRAVENOUS | Status: AC
  Filled 2022-02-03: qty 1000

## 2022-02-03 MED ORDER — LABETALOL HCL 100 MG PO TABS
50.0000 mg | ORAL_TABLET | Freq: Two times a day (BID) | ORAL | Status: DC
  Administered 2022-02-04 (×2): 50 mg via ORAL
  Filled 2022-02-03 (×2): qty 1

## 2022-02-03 MED ORDER — SODIUM CHLORIDE 0.9 % IV SOLN: 250.0000 mL | INTRAVENOUS | Status: DC | PRN

## 2022-02-03 MED ORDER — FUROSEMIDE 10 MG/ML IJ SOLN
80.0000 mg | Freq: Two times a day (BID) | INTRAMUSCULAR | Status: AC
  Administered 2022-02-03: 80 mg via INTRAVENOUS
  Filled 2022-02-03: qty 8

## 2022-02-03 MED ORDER — SODIUM CHLORIDE 0.9 % IV SOLN: INTRAVENOUS | Status: AC

## 2022-02-03 MED ORDER — SODIUM CHLORIDE 0.9% FLUSH: 3.0000 mL | INTRAVENOUS | Status: DC | PRN

## 2022-02-03 SURGICAL SUPPLY — 22 items
BALLN COYOTE OTW 3X60X150 (BALLOONS) ×3
BALLN MUSTANG 12X60X75 (BALLOONS) ×3
BALLOON COYOTE OTW 3X60X150 (BALLOONS) IMPLANT
BALLOON MUSTANG 12X60X75 (BALLOONS) IMPLANT
CATH OMNI FLUSH 5F 65CM (CATHETERS) ×1 IMPLANT
CATH SHOCKWAVE M5 8.0X60 (CATHETERS) ×1 IMPLANT
CATH SOFT-VU 4F 65 STRAIGHT (CATHETERS) IMPLANT
CATH SOFT-VU STRAIGHT 4F 65CM (CATHETERS) ×3
CLOSURE PERCLOSE PROSTYLE (VASCULAR PRODUCTS) ×1 IMPLANT
DEVICE TORQUE .025-.038 (MISCELLANEOUS) ×1 IMPLANT
GUIDEWIRE ANGLED .035X150CM (WIRE) ×1 IMPLANT
KIT ANGIASSIST CO2 SYSTEM (KITS) ×1 IMPLANT
KIT ENCORE 26 ADVANTAGE (KITS) ×1 IMPLANT
KIT MICROPUNCTURE NIT STIFF (SHEATH) ×1 IMPLANT
KIT PV (KITS) ×3 IMPLANT
SHEATH PINNACLE 6F 10CM (SHEATH) ×1 IMPLANT
SHEATH PINNACLE 7F 10CM (SHEATH) ×1 IMPLANT
SYR MEDRAD MARK 7 150ML (SYRINGE) ×3 IMPLANT
TRANSDUCER W/STOPCOCK (MISCELLANEOUS) ×3 IMPLANT
TRAY PV CATH (CUSTOM PROCEDURE TRAY) ×3 IMPLANT
WIRE BENTSON .035X145CM (WIRE) ×1 IMPLANT
WIRE SPARTACORE .014X300CM (WIRE) ×1 IMPLANT

## 2022-02-03 NOTE — Progress Notes (Signed)
?PROGRESS NOTE ? ? ? ?Gwendolyn [name]  FOY:[id number] DOB: 15-Mar-1956 DOA: 02/01/2022 ?PCP: Jilda Panda, MD  ?Narrative: 65/F with history of COPD, tobacco abuse, resistant hypertension, high-grade distal aortic stenosis, chronic diastolic CHF presented to the ED 3/28 with worsening dyspnea.  Recently Lasix dose was increased to 40 Mg daily. ?-In the ED she was acutely dyspneic, hypoxic, chest x-ray noted pulmonary vascular congestion and interstitial edema, labs noted BNP of 1788, creatinine of 1.9 up from baseline of 0.7 ?-now w/ SEVERE OLIGURIC AKI ? ? ?Subjective: ?-Overnight had dyspnea, required BiPAP ? ?Assessment and Plan: ? ?Acute kidney injury, Oliguric ?-Baseline creatinine around 0.7,->1.99 on admission then 3.1, now 5.8 ?-Only 100 mL urine output  ?-Renal ultrasound without hydronephrosis, ARB on hold ?-Renal artery duplex-normal on the right, 1-59% stenosis on the left ?-Has known severe distal aortic stenosis, we suspect this could be contributing to poor renal perfusion, BP has been stable, plan for CO2 angiogram per Dr. Einar Gip today, she is not felt to be a surgical candidate ?-Anticipate need for dialysis in the next 24 to 48 hours, unless creatinine improves ?-Avoid hypotension ? ?Acute hypoxic respiratory failure ?Acute diastolic CHF ?-Recent echo 11/22 noted EF of 55-60% ?-Plan for repeat Lasix today ?-See discussion above ? ?Uncontrolled hypertension ?-Improving, continue hydralazine, isosorbide, amlodipine, labetalol ?-Avoid hypotension ? ?COPD ?Tobacco abuse ?-Clinically do not suspect COPD exacerbation at this time, will discontinue Levaquin and monitor ?-Continue ICS/LABA, DuoNebs ? ?Severe distal aortic stenosis ?-Diagnosed on CT: 10/22-noted very severe calcified atherosclerosis of the abdominal aorta with ?NEAR AORTIC OCCLUSION at the SMA level.Associated High-grade Stenosis of nearly all major aortic branch origins. And high-grade stenosis of the left Common iliac artery ?also due to  bulky calcified plaque. ?-Saw Dr. Scot Dock in the ED 10/21, he recommended smoking cessation, if she were to develop symptoms of mesenteric ischemia only surgical option could be aortic endarterectomy at a tertiary care hospital like [facility] ? ?Hypothyroidism ?-Continue Synthroid ? ?DVT prophylaxis: Lovenox ?Code Status: Full code ?Family Communication: Discussed with patient in detail, no family at bedside ?Disposition Plan: To be determined ? ?Consultants:  ?Nephrology, cardiology ? ?Procedures:  ? ?Antimicrobials:  ? ? ?Objective: ?Vitals:  ? 02/03/22 0431 02/03/22 0500 02/03/22 0736 02/03/22 1100  ?BP: (!) 149/52  (!) 165/54 (!) 137/51  ?Pulse: 85  80 70  ?Resp: (!) 25  15 (!) 24  ?Temp: 98.5 ?F (36.9 ?C)  97.6 ?F (36.4 ?C) 98 ?F (36.7 ?C)  ?TempSrc: Oral  Oral Oral  ?SpO2: 94%  96% 99%  ?Weight:  65.2 kg    ?Height:      ? ? ?Intake/Output Summary (Last 24 hours) at 02/03/2022 1228 ?Last data filed at 02/03/2022 7672 ?Gross per 24 hour  ?Intake 90 ml  ?Output 100 ml  ?Net -10 ml  ? ?Filed Weights  ? 02/01/22 1519 02/02/22 0024 02/03/22 0500  ?Weight: 64 kg 64.4 kg 65.2 kg  ? ? ?Examination: ? ?General exam: Ill-appearing female laying in bed, AAO x2, uncomfortable appearing ?HEENT: Positive JVD ?CVS: S1-S2, regular rhythm ?Lungs: Poor air movement bilaterally ?Abdomen: Soft, nontender, bowel sounds present ?Extremities: No edema  ?Skin: No rashes ?Psychiatry: Judgement and insight appear normal. Mood & affect appropriate.  ? ? ? ?Data Reviewed:  ? ?CBC: ?Recent Labs  ?Lab 02/01/22 ?1520 02/01/22 ?1830 02/02/22 ?1135 02/03/22 ?0947  ?WBC 21.2*  --  11.2* 16.0*  ?NEUTROABS 18.0*  --  9.8*  --   ?HGB 11.3* 10.9* 9.1* 9.2*  ?HCT  35.4* 32.0* 28.2* 28.1*  ?MCV 82.5  --  80.6 79.4*  ?PLT 242  --  210 217  ? ?Basic Metabolic Panel: ?Recent Labs  ?Lab 02/01/22 ?1520 02/01/22 ?1830 02/02/22 ?0435 02/03/22 ?5784  ?NA 135 137 133* 131*  ?K 4.4 4.6 4.9 4.9  ?CL 105  --  105 99  ?CO2 21*  --  17* 19*  ?GLUCOSE 159*  --   131* 107*  ?BUN 25*  --  33* 54*  ?CREATININE 1.99*  --  3.14* 5.84*  ?CALCIUM 9.2  --  8.9 8.8*  ? ?GFR: ?Estimated Creatinine Clearance: 7.9 mL/min (A) (by C-G formula based on SCr of 5.84 mg/dL (H)). ?Liver Function Tests: ?Recent Labs  ?Lab 02/01/22 ?1520  ?AST 21  ?ALT 17  ?ALKPHOS 71  ?BILITOT 0.6  ?PROT 6.8  ?ALBUMIN 4.2  ? ?No results for input(s): LIPASE, AMYLASE in the last 168 hours. ?No results for input(s): AMMONIA in the last 168 hours. ?Coagulation Profile: ?No results for input(s): INR, PROTIME in the last 168 hours. ?Cardiac Enzymes: ?No results for input(s): CKTOTAL, CKMB, CKMBINDEX, TROPONINI in the last 168 hours. ?BNP (last 3 results) ?No results for input(s): PROBNP in the last 8760 hours. ?HbA1C: ?No results for input(s): HGBA1C in the last 72 hours. ?CBG: ?No results for input(s): GLUCAP in the last 168 hours. ?Lipid Profile: ?No results for input(s): CHOL, HDL, LDLCALC, TRIG, CHOLHDL, LDLDIRECT in the last 72 hours. ?Thyroid Function Tests: ?Recent Labs  ?  02/01/22 ?1520 02/01/22 ?1522  ?TSH  --  0.225*  ?FREET4 1.34*  --   ? ?Anemia Panel: ?No results for input(s): VITAMINB12, FOLATE, FERRITIN, TIBC, IRON, RETICCTPCT in the last 72 hours. ?Urine analysis: ?   ?Component Value Date/Time  ? COLORURINE YELLOW 08/27/2021 1204  ? APPEARANCEUR CLEAR 08/27/2021 1204  ? LABSPEC >1.046 (H) 08/27/2021 1204  ? PHURINE 6.0 08/27/2021 1204  ? GLUCOSEU NEGATIVE 08/27/2021 1204  ? HGBUR NEGATIVE 08/27/2021 1204  ? [place] NEGATIVE 08/27/2021 1204  ? [place] NEGATIVE 08/27/2021 1204  ? PROTEINUR NEGATIVE 08/27/2021 1204  ? NITRITE NEGATIVE 08/27/2021 1204  ? LEUKOCYTESUR SMALL (A) 08/27/2021 1204  ? ?Sepsis Labs: ?'@LABRCNTIP'$ (procalcitonin:4,lacticidven:4) ? ?) ?Recent Results (from the past 240 hour(s))  ?Resp Panel by RT-PCR (Flu A&B, Covid) Nasopharyngeal Swab     Status: None  ? Collection Time: 02/01/22  3:21 PM  ? Specimen: Nasopharyngeal Swab; Nasopharyngeal(NP) swabs in vial transport medium   ?Result Value Ref Range Status  ? SARS Coronavirus 2 by RT PCR NEGATIVE NEGATIVE Final  ?  Comment: (NOTE) ?SARS-CoV-2 target nucleic acids are NOT DETECTED. ? ?The SARS-CoV-2 RNA is generally detectable in upper respiratory ?specimens during the acute phase of infection. The lowest ?concentration of SARS-CoV-2 viral copies this assay can detect is ?138 copies/mL. A negative result does not preclude SARS-Cov-2 ?infection and should not be used as the sole basis for treatment or ?other patient management decisions. A negative result may occur with  ?improper specimen collection/handling, submission of specimen other ?than nasopharyngeal swab, presence of viral mutation(s) within the ?areas targeted by this assay, and inadequate number of viral ?copies(<138 copies/mL). A negative result must be combined with ?clinical observations, patient history, and epidemiological ?information. The expected result is Negative. ? ?Fact Sheet for Patients:  ?EntrepreneurPulse.com.au ? ?Fact Sheet for Healthcare Providers:  ?IncredibleEmployment.be ? ?This test is no t yet approved or cleared by the [place] FDA and  ?has been authorized for detection and/or diagnosis of SARS-CoV-2 by ?FDA under an  Emergency Use Authorization (EUA). This EUA will remain  ?in effect (meaning this test can be used) for the duration of the ?COVID-19 declaration under Section 564(b)(1) of the Act, 21 ?U.S.C.section 360bbb-3(b)(1), unless the authorization is terminated  ?or revoked sooner.  ? ? ?  ? Influenza A by PCR NEGATIVE NEGATIVE Final  ? Influenza B by PCR NEGATIVE NEGATIVE Final  ?  Comment: (NOTE) ?The Xpert Xpress SARS-CoV-2/FLU/RSV plus assay is intended as an aid ?in the diagnosis of influenza from Nasopharyngeal swab specimens and ?should not be used as a sole basis for treatment. Nasal washings and ?aspirates are unacceptable for Xpert Xpress SARS-CoV-2/FLU/RSV ?testing. ? ?Fact Sheet for  Patients: ?EntrepreneurPulse.com.au ? ?Fact Sheet for Healthcare Providers: ?IncredibleEmployment.be ? ?This test is not yet approved or cleared by the [place] and ?has

## 2022-02-03 NOTE — Interval H&P Note (Signed)
History and Physical Interval Note: ? ?02/03/2022 ?3:37 PM ? ?Gwendolyn [name]  has presented today for surgery, with the diagnosis of chest pain.  The various methods of treatment have been discussed with the patient and family. After consideration of risks, benefits and other options for treatment, the patient has consented to  Procedure(s): ?ABDOMINAL AORTOGRAM W/LOWER EXTREMITY (N/A) ?RENAL ANGIOGRAPHY (N/A) and possible angioplasty as a surgical intervention.  The patient's history has been reviewed, patient examined, no change in status, stable for surgery.  I have reviewed the patient's chart and labs.  Questions were answered to the patient's satisfaction.   ? ? ?Adrian Prows ? ? ?

## 2022-02-03 NOTE — Progress Notes (Signed)
Subjective:  ?Patient seen and examined at approximately 8:30 AM  ?Patient resting in bed on 3.0 L via [place] ?Cr worsened further to 5.84, nephrology following  ?Urine output in last 24 hours 100 mL ?Blood pressure remains uncontrolled  ? ?Intake/Output from previous day: ? ?I/O last 3 completed shifts: ?In: 210 [P.O.:210] ?Out: 100 [Urine:100] ?Total I/O ?In: 0  ?Out: 100 [Stool:100] ? ?Blood pressure (!) 137/51, pulse 70, temperature 98 ?F (36.7 ?C), temperature source Oral, resp. rate (!) 24, height 4' 11.5" (1.511 m), weight 65.2 kg, SpO2 99 %. ?Physical Exam ?Vitals and nursing note reviewed.  ?Cardiovascular:  ?   Rate and Rhythm: Normal rate and regular rhythm.  ?   Pulses:     ?     Carotid pulses are  on the right side with bruit and  on the left side with bruit. ?     Dorsalis pedis pulses are 0 on the right side and 0 on the left side.  ?     Posterior tibial pulses are 1+ on the right side and 1+ on the left side.  ?   Heart sounds: S1 normal and S2 normal. No murmur heard. ?  No gallop.  ?Pulmonary:  ?   Effort: Pulmonary effort is normal. No respiratory distress.  ?   Breath sounds: Decreased air movement (bilaterally) present. Wheezing present.  ?   Comments: On 3 L via [place] ?Musculoskeletal:  ?   Right lower leg: No edema.  ?   Left lower leg: No edema.  ?Neurological:  ?   Mental Status: She is alert.  ? ?Lab Results: ?BMP ?BNP (last 3 results) ?Recent Labs  ?  08/10/21 ?1325 02/01/22 ?1520  ?BNP 147.8* 1,788.9*  ? ? ? ?ProBNP (last 3 results) ?No results for input(s): PROBNP in the last 8760 hours. ? ?  Latest Ref Rng & Units 02/03/2022  ?  6:05 AM 02/02/2022  ?  4:35 AM 02/01/2022  ?  6:30 PM  ?BMP  ?Glucose 70 - 99 mg/dL 107   131     ?BUN 8 - 23 mg/dL 54   33     ?Creatinine 0.44 - 1.00 mg/dL 5.84   3.14     ?Sodium 135 - 145 mmol/L 131   133   137    ?Potassium 3.5 - 5.1 mmol/L 4.9   4.9   4.6    ?Chloride 98 - 111 mmol/L 99   105     ?CO2 22 - 32 mmol/L 19   17     ?Calcium 8.9 - 10.3 mg/dL 8.8   8.9      ? ? ?  Latest Ref Rng & Units 02/01/2022  ?  3:20 PM 08/27/2021  ? 11:39 AM 08/10/2021  ?  1:25 PM  ?Hepatic Function  ?Total Protein 6.5 - 8.1 g/dL 6.8   6.5   7.6    ?Albumin 3.5 - 5.0 g/dL 4.2   4.0   4.5    ?AST 15 - 41 U/L _0 ?ALT 0 - 44 U/L _1 ?Alk Phosphatase 38 - 126 U/L 71   60   60    ?Total Bilirubin 0.3 - 1.2 mg/dL 0.6   0.4   0.3    ? ? ?  Latest Ref Rng & Units 02/03/2022  ?  6:05 AM 02/02/2022  ? 11:35 AM 02/01/2022  ?  6:30 PM  ?CBC  ?WBC 4.0 - 10.5 K/uL 16.0   11.2     ?Hemoglobin 12.0 - 15.0 g/dL 9.2   9.1   10.9    ?Hematocrit 36.0 - 46.0 % 28.1   28.2   32.0    ?Platelets 150 - 400 K/uL 217   210     ? ?Lipid Panel  ?No results found for: CHOL, TRIG, HDL, CHOLHDL, VLDL, LDLCALC, LDLDIRECT ?Cardiac Panel (last 3 results) ?No results for input(s): CKTOTAL, CKMB, TROPONINI, RELINDX in the last 72 hours. ? ?HEMOGLOBIN A1C ?No results found for: HGBA1C, MPG ?TSH ?Recent Labs  ?  08/27/21 ?1351 02/01/22 ?1522  ?TSH 0.706 0.225*  ? ? ?Imaging: ?Chest x-ray 02/02/2022:  ?Interval improved pulmonary vascular congestion. Small bilateral ?pleural effusions. ? ?CT abdomen/pelvis 08/27/2021:  ?1. Very severe calcified atherosclerosis of the abdominal aorta with ?NEAR AORTIC OCCLUSION at the SMA level. ?Associated High-grade Stenosis of nearly all major aortic branch ?origins. And high-grade stenosis of the left Common iliac artery ?also due to bulky calcified plaque. ?Recommend Vascular Surgery Consultation. Aortic Atherosclerosis ?(ICD10-I70.0). ?  ?2. Superimposed mild inflammatory stranding in the right upper ?quadrant, near the porta hepatis and hepatic flexure of colon. This ?might be a mild Acute Colitis - perhaps a low-grade ischemic colitis ?in light of #1, uncertain. ?  ?3. Trace bilateral pleural effusions, larger on the right. Cardiac ?enlargement since 2008, borderline cardiomegaly with trace ?pericardial fluid. ?  ?4. No other acute or inflammatory process identified in  the abdomen ?or pelvis. ?Bilateral nephrolithiasis with developing staghorn calculus in the ?right lower pole. ?Chronic postinflammatory calcification of upper mesenteric lymph ?nodes, an area affected by acute pancreatitis in 2008. ? ?Renal ultrasound 02/01/2022:  ?1. Increased echogenicity in the left kidney, as can be seen with ?medical renal disease. ?2. Nonobstructing calculi in the bilateral kidneys, measuring up to ?1.8 cm on the right. ? ?Cardiac Studies: ?Renal artery duplex 02/02/2022:  ?Right: Normal right Resisitive Index. No evidence of right renal  ?       artery stenosis. RRV flow present. Normal size right kidney.  ?Left:  Normal left Resistive Index. 1-59% stenosis of the left renal  ?       artery. LRV flow present. Normal size of left kidney.  ? ?Echocardiogram 02/01/2022: ?Study interrupted due to severe shortness of breath. ?Limited images in parasternal long axis show ejection fraction around 45-50%. ?  ?Echocardiogram 09/09/2021:  ?Normal LV systolic function with visual EF 55-60%. Left ventricle cavity  ?is normal in size. Mild left ventricular hypertrophy. Normal global wall  ?motion. Doppler evidence of grade II diastolic dysfunction, elevated LAP.  ?Left atrial cavity is severely dilated.  ?Aortic valve sclerosis without stenosis.  ?Mild (Grade I) aortic regurgitation.  ?Mild (Grade I) mitral regurgitation.  ?Mild tricuspid regurgitation. No evidence of pulmonary hypertension.  ?Small circumferential pericardial effusion. There is no hemodynamic  ?significance.  ?IVC is dilated with a respiratory response of >50%.  ?No prior study for comparison. ?  ?EKG 02/01/2022:: ?Sinus rhythm 90 bpm ?Left atrial enlargement ?LVH ?Possible old anterior infarct ? ?Telemetry:  ?No significant arrhythmias  ? ?No results found for this or any previous visit (from the past 43800 hour(s)). ? ?Scheduled Meds: ? amLODipine  10 mg Oral Daily  ? atorvastatin  80 mg Oral QHS  ? enoxaparin (LOVENOX) injection  30 mg  Subcutaneous Q24H  ? feeding supplement  1 Container Oral TID BM  ? fluticasone furoate-vilanterol  1 puff Inhalation Daily  ?  furosemide  80 mg Intravenous Q12H  ? hydrALAZINE  100 mg Oral Q8H  ? ipratropium-albuterol  3 mL Nebulization Q6H  ? isosorbide mononitrate  30 mg Oral Daily  ? labetalol  200 mg Oral QHS  ? labetalol  300 mg Oral Daily  ? levothyroxine  100 mcg Oral QAC breakfast  ? multivitamin with minerals  1 tablet Oral Daily  ? nicotine  21 mg Transdermal Daily  ? sodium chloride flush  3 mL Intravenous Q12H  ? sodium chloride flush  3 mL Intravenous Q12H  ? umeclidinium bromide  1 puff Inhalation Daily  ? ?Continuous Infusions: ? sodium chloride    ? sodium chloride    ? sodium chloride    ? ?PRN Meds:.sodium chloride, sodium chloride, acetaminophen, albuterol, ondansetron (ZOFRAN) IV, simethicone, sodium chloride flush, sodium chloride flush ? ?Assessment/Plan:  ?Suspect severe abdominal aortic stenosis is potentially underlying etiology of acute on chronic HFpEF, acute chronic cor pulmonale, and acute renal failure.  Therefore decision has been made to do CO2 abdominal aortogram as patient is not a surgical candidate.  ? ?Appreciate nephrology commendations regarding renal function, diuresis, and hypertension management.  Further recommendations pending results of CO2 angiogram. ? ?Patient was seen in collaboration with Dr. Einar Gip. He also reviewed patient's chart and examined the patient. Dr. Einar Gip is in agreement of the plan.  ? ? ?Alethia Berthold, PA-C ?02/03/2022, 2:10 PM ?Office: [phone] ? ?

## 2022-02-03 NOTE — Progress Notes (Signed)
Patient started to vomit. Doctor in room Zofran IV given ?

## 2022-02-03 NOTE — Progress Notes (Addendum)
Suprarenal abdominal aortic stenosis angioplasty 02/03/2022: ? ?Bilateral renal arteries widely patent with mild atherosclerosis.  Suprarenal abdominal aorta severely calcific and stenosed by about 95%.   ? ?Stenosis reduced from 95% to probably insignificant stenosis (<50%).  Angiogram not performed to conserve contrast and would have been poorly visualized with CO2. ?  ?Pressure gradient pre and post angioplasty:  ?Opening right common femoral artery pressure 52/44 mmHg.  Suprarenal thoracic aortic pressure 180/58 mmHg with a mean of 103 mmHg.  Pressure gradient of approximately 130 mmHg.  Closing right femoral artery pressure 121/62 mmHg.  Pressure gradient approximately 30 mmHg post angioplasty. ? ? ?SUPRA RENAL THORACIC PRESSURE ? ?RIGHT COMMON FEMORAL ARTERY PRESSURE PRE. ? ?POST LITHOTRYPSY SHOCKWAVE ANGIOPLASTY ? ?CLOSING PRESSURE IN THE RIGHT COMMON FEMORAL ARTERY ? ?STENOSIS SEVERITY OF THE SUPRARENAL ABDOMINAL AORTA ? ?SHOCKWAVE BALLOON GRADUAL EXPANSION WITH PULSE ULTRASOUND DELIVERY ? ? ? ?FINAL 12X60 MM MUSTANG BALLOON ANGIOPLASTY. ? ? ?Adrian Prows, MD, [facility] ?02/03/2022, 6:07 PM ?Office: [phone] ?Fax: [phone] ?Pager: [phone]  ?

## 2022-02-03 NOTE — Progress Notes (Signed)
?  Echocardiogram ?2D Echocardiogram has been performed. ? ?Gwendolyn [name] ?02/03/2022, 3:19 PM ?

## 2022-02-03 NOTE — H&P (View-Only) (Signed)
Subjective:  ?Patient seen and examined at approximately 8:30 AM  ?Patient resting in bed on 3.0 L via [place] ?Cr worsened further to 5.84, nephrology following  ?Urine output in last 24 hours 100 mL ?Blood pressure remains uncontrolled  ? ?Intake/Output from previous day: ? ?I/O last 3 completed shifts: ?In: 210 [P.O.:210] ?Out: 100 [Urine:100] ?Total I/O ?In: 0  ?Out: 100 [Stool:100] ? ?Blood pressure (!) 137/51, pulse 70, temperature 98 ?F (36.7 ?C), temperature source Oral, resp. rate (!) 24, height 4' 11.5" (1.511 m), weight 65.2 kg, SpO2 99 %. ?Physical Exam ?Vitals and nursing note reviewed.  ?Cardiovascular:  ?   Rate and Rhythm: Normal rate and regular rhythm.  ?   Pulses:     ?     Carotid pulses are  on the right side with bruit and  on the left side with bruit. ?     Dorsalis pedis pulses are 0 on the right side and 0 on the left side.  ?     Posterior tibial pulses are 1+ on the right side and 1+ on the left side.  ?   Heart sounds: S1 normal and S2 normal. No murmur heard. ?  No gallop.  ?Pulmonary:  ?   Effort: Pulmonary effort is normal. No respiratory distress.  ?   Breath sounds: Decreased air movement (bilaterally) present. Wheezing present.  ?   Comments: On 3 L via [place] ?Musculoskeletal:  ?   Right lower leg: No edema.  ?   Left lower leg: No edema.  ?Neurological:  ?   Mental Status: She is alert.  ? ?Lab Results: ?BMP ?BNP (last 3 results) ?Recent Labs  ?  08/10/21 ?1325 02/01/22 ?1520  ?BNP 147.8* 1,788.9*  ? ? ? ?ProBNP (last 3 results) ?No results for input(s): PROBNP in the last 8760 hours. ? ?  Latest Ref Rng & Units 02/03/2022  ?  6:05 AM 02/02/2022  ?  4:35 AM 02/01/2022  ?  6:30 PM  ?BMP  ?Glucose 70 - 99 mg/dL 107   131     ?BUN 8 - 23 mg/dL 54   33     ?Creatinine 0.44 - 1.00 mg/dL 5.84   3.14     ?Sodium 135 - 145 mmol/L 131   133   137    ?Potassium 3.5 - 5.1 mmol/L 4.9   4.9   4.6    ?Chloride 98 - 111 mmol/L 99   105     ?CO2 22 - 32 mmol/L 19   17     ?Calcium 8.9 - 10.3 mg/dL 8.8   8.9      ? ? ?  Latest Ref Rng & Units 02/01/2022  ?  3:20 PM 08/27/2021  ? 11:39 AM 08/10/2021  ?  1:25 PM  ?Hepatic Function  ?Total Protein 6.5 - 8.1 g/dL 6.8   6.5   7.6    ?Albumin 3.5 - 5.0 g/dL 4.2   4.0   4.5    ?AST 15 - 41 U/L _0 ?ALT 0 - 44 U/L _1 ?Alk Phosphatase 38 - 126 U/L 71   60   60    ?Total Bilirubin 0.3 - 1.2 mg/dL 0.6   0.4   0.3    ? ? ?  Latest Ref Rng & Units 02/03/2022  ?  6:05 AM 02/02/2022  ? 11:35 AM 02/01/2022  ?  6:30 PM  ?CBC  ?WBC 4.0 - 10.5 K/uL 16.0   11.2     ?Hemoglobin 12.0 - 15.0 g/dL 9.2   9.1   10.9    ?Hematocrit 36.0 - 46.0 % 28.1   28.2   32.0    ?Platelets 150 - 400 K/uL 217   210     ? ?Lipid Panel  ?No results found for: CHOL, TRIG, HDL, CHOLHDL, VLDL, LDLCALC, LDLDIRECT ?Cardiac Panel (last 3 results) ?No results for input(s): CKTOTAL, CKMB, TROPONINI, RELINDX in the last 72 hours. ? ?HEMOGLOBIN A1C ?No results found for: HGBA1C, MPG ?TSH ?Recent Labs  ?  08/27/21 ?1351 02/01/22 ?1522  ?TSH 0.706 0.225*  ? ? ?Imaging: ?Chest x-ray 02/02/2022:  ?Interval improved pulmonary vascular congestion. Small bilateral ?pleural effusions. ? ?CT abdomen/pelvis 08/27/2021:  ?1. Very severe calcified atherosclerosis of the abdominal aorta with ?NEAR AORTIC OCCLUSION at the SMA level. ?Associated High-grade Stenosis of nearly all major aortic branch ?origins. And high-grade stenosis of the left Common iliac artery ?also due to bulky calcified plaque. ?Recommend Vascular Surgery Consultation. Aortic Atherosclerosis ?(ICD10-I70.0). ?  ?2. Superimposed mild inflammatory stranding in the right upper ?quadrant, near the porta hepatis and hepatic flexure of colon. This ?might be a mild Acute Colitis - perhaps a low-grade ischemic colitis ?in light of #1, uncertain. ?  ?3. Trace bilateral pleural effusions, larger on the right. Cardiac ?enlargement since 2008, borderline cardiomegaly with trace ?pericardial fluid. ?  ?4. No other acute or inflammatory process identified in  the abdomen ?or pelvis. ?Bilateral nephrolithiasis with developing staghorn calculus in the ?right lower pole. ?Chronic postinflammatory calcification of upper mesenteric lymph ?nodes, an area affected by acute pancreatitis in 2008. ? ?Renal ultrasound 02/01/2022:  ?1. Increased echogenicity in the left kidney, as can be seen with ?medical renal disease. ?2. Nonobstructing calculi in the bilateral kidneys, measuring up to ?1.8 cm on the right. ? ?Cardiac Studies: ?Renal artery duplex 02/02/2022:  ?Right: Normal right Resisitive Index. No evidence of right renal  ?       artery stenosis. RRV flow present. Normal size right kidney.  ?Left:  Normal left Resistive Index. 1-59% stenosis of the left renal  ?       artery. LRV flow present. Normal size of left kidney.  ? ?Echocardiogram 02/01/2022: ?Study interrupted due to severe shortness of breath. ?Limited images in parasternal long axis show ejection fraction around 45-50%. ?  ?Echocardiogram 09/09/2021:  ?Normal LV systolic function with visual EF 55-60%. Left ventricle cavity  ?is normal in size. Mild left ventricular hypertrophy. Normal global wall  ?motion. Doppler evidence of grade II diastolic dysfunction, elevated LAP.  ?Left atrial cavity is severely dilated.  ?Aortic valve sclerosis without stenosis.  ?Mild (Grade I) aortic regurgitation.  ?Mild (Grade I) mitral regurgitation.  ?Mild tricuspid regurgitation. No evidence of pulmonary hypertension.  ?Small circumferential pericardial effusion. There is no hemodynamic  ?significance.  ?IVC is dilated with a respiratory response of >50%.  ?No prior study for comparison. ?  ?EKG 02/01/2022:: ?Sinus rhythm 90 bpm ?Left atrial enlargement ?LVH ?Possible old anterior infarct ? ?Telemetry:  ?No significant arrhythmias  ? ?No results found for this or any previous visit (from the past 43800 hour(s)). ? ?Scheduled Meds: ? amLODipine  10 mg Oral Daily  ? atorvastatin  80 mg Oral QHS  ? enoxaparin (LOVENOX) injection  30 mg  Subcutaneous Q24H  ? feeding supplement  1 Container Oral TID BM  ? fluticasone furoate-vilanterol  1 puff Inhalation Daily  ?  furosemide  80 mg Intravenous Q12H  ? hydrALAZINE  100 mg Oral Q8H  ? ipratropium-albuterol  3 mL Nebulization Q6H  ? isosorbide mononitrate  30 mg Oral Daily  ? labetalol  200 mg Oral QHS  ? labetalol  300 mg Oral Daily  ? levothyroxine  100 mcg Oral QAC breakfast  ? multivitamin with minerals  1 tablet Oral Daily  ? nicotine  21 mg Transdermal Daily  ? sodium chloride flush  3 mL Intravenous Q12H  ? sodium chloride flush  3 mL Intravenous Q12H  ? umeclidinium bromide  1 puff Inhalation Daily  ? ?Continuous Infusions: ? sodium chloride    ? sodium chloride    ? sodium chloride    ? ?PRN Meds:.sodium chloride, sodium chloride, acetaminophen, albuterol, ondansetron (ZOFRAN) IV, simethicone, sodium chloride flush, sodium chloride flush ? ?Assessment/Plan:  ?Suspect severe abdominal aortic stenosis is potentially underlying etiology of acute on chronic HFpEF, acute chronic cor pulmonale, and acute renal failure.  Therefore decision has been made to do CO2 abdominal aortogram as patient is not a surgical candidate.  ? ?Appreciate nephrology commendations regarding renal function, diuresis, and hypertension management.  Further recommendations pending results of CO2 angiogram. ? ?Patient was seen in collaboration with Dr. Einar Gip. He also reviewed patient's chart and examined the patient. Dr. Einar Gip is in agreement of the plan.  ? ? ?Alethia Berthold, PA-C ?02/03/2022, 2:10 PM ?Office: [phone] ? ?

## 2022-02-03 NOTE — Progress Notes (Signed)
Subjective:  only 100 of UOP recorded but that is actually more for her.   kidney labs worse-  more difficulty with breathing this AM reportedly on bipap but when I see her in on high flow O2-  can complete sentences-  is nauseated and throwing up at present.  Dr. Einar Gip saw due to this history of aorta stenosis-  is going to do a co2 angio to see if renal arteries are getting flow ? ?Objective ?Vital signs in last 24 hours: ?Vitals:  ? 02/03/22 0233 02/03/22 0431 02/03/22 0500 02/03/22 0736  ?BP:  (!) 149/52  (!) 165/54  ?Pulse: 86 85  80  ?Resp:  (!) 25  15  ?Temp:  98.5 ?F (36.9 ?C)  97.6 ?F (36.4 ?C)  ?TempSrc:  Oral  Oral  ?SpO2:  94%  96%  ?Weight:   65.2 kg   ?Height:      ? ?Weight change: 1.243 kg ? ?Intake/Output Summary (Last 24 hours) at 02/03/2022 0847 ?Last data filed at 02/03/2022 0600 ?Gross per 24 hour  ?Intake 90 ml  ?Output 100 ml  ?Net -10 ml  ? ? ?Assessment/Plan: 66 year old WF with COPD, HTN and diastolic heart failure presenting with AKI along with SOB ?1.Renal- normal crt in October-  she also reports very recent labs where her kidney function was normal.  Now with AKI in the setting of SOB and malignant HTN.  Crt has worsened over the last 48 hours or so-  she was anuric- now oliguric.  Ultrasound is not very remarkable, nor is duplex.  There is no urine for review.   I suspect that this is hemodynamically mediated.  Initially had a malignant HTN- SBP 214, then lowered to 425 systolic with an ARB on board.  Question of this significance of previously described distal aortic dz-  Dr. Einar Gip to do invasive eval today.  Agree with holding further ARB and trying to keep BP in this range even 140- 160 so we can set the stage for the kidneys to recover.  No uop is very concerning-  did make some overnight.  I have told her that we might get to a point of needing to support her with dialysis if she continues to not make urine and labs get worse-  no absolute need right now ?2. Hypertension/volume  - I  understand that initial CXR read as pulmonary edema.  She reports breathing is better and has not had any urine out.  I think her high BP at the time exacerbated the issue but now that her BP is better she is better.  She doesn't seem globally overloaded-  no edema -  air movement is actually better today.  I will add on some lasix though since did make uriine to see if we can augment ?3. Elytes and acidosis-  does have acidosis and K is on the upper limit of normal-  stable so far ?4. Anemia  - hgb 9.1 not helping her SOB-  supportive care ?5. Nausea-  not sure a BUN of 54 would do that-  giving zofran  ?  ? ? ?Louis Meckel  ? ? ?Labs: ?Basic Metabolic Panel: ?Recent Labs  ?Lab 02/01/22 ?1520 02/01/22 ?1830 02/02/22 ?0435 02/03/22 ?9563  ?NA 135 137 133* 131*  ?K 4.4 4.6 4.9 4.9  ?CL 105  --  105 99  ?CO2 21*  --  17* 19*  ?GLUCOSE 159*  --  131* 107*  ?BUN 25*  --  33*  54*  ?CREATININE 1.99*  --  3.14* 5.84*  ?CALCIUM 9.2  --  8.9 8.8*  ? ?Liver Function Tests: ?Recent Labs  ?Lab 02/01/22 ?1520  ?AST 21  ?ALT 17  ?ALKPHOS 71  ?BILITOT 0.6  ?PROT 6.8  ?ALBUMIN 4.2  ? ?No results for input(s): LIPASE, AMYLASE in the last 168 hours. ?No results for input(s): AMMONIA in the last 168 hours. ?CBC: ?Recent Labs  ?Lab 02/01/22 ?1520 02/01/22 ?1830 02/02/22 ?1135 02/03/22 ?2633  ?WBC 21.2*  --  11.2* 16.0*  ?NEUTROABS 18.0*  --  9.8*  --   ?HGB 11.3* 10.9* 9.1* 9.2*  ?HCT 35.4* 32.0* 28.2* 28.1*  ?MCV 82.5  --  80.6 79.4*  ?PLT 242  --  210 217  ? ?Cardiac Enzymes: ?No results for input(s): CKTOTAL, CKMB, CKMBINDEX, TROPONINI in the last 168 hours. ?CBG: ?No results for input(s): GLUCAP in the last 168 hours. ? ?Iron Studies: No results for input(s): IRON, TIBC, TRANSFERRIN, FERRITIN in the last 72 hours. ?Studies/Results: ?DG Chest 1 View ? ?Result Date: 02/02/2022 ?CLINICAL DATA:  Shortness of breath EXAM: CHEST  1 VIEW COMPARISON:  Chest x-ray 02/01/2022 FINDINGS: Cardiomegaly and mediastinum appear stable.  Calcified plaques in the aortic arch. Mild central pulmonary vascular prominence, improved since previous study. Small bilateral pleural effusions with associated atelectasis. No pneumothorax. IMPRESSION: Interval improved pulmonary vascular congestion. Small bilateral pleural effusions. Electronically Signed   By: Ofilia Neas M.D.   On: 02/02/2022 08:27  ? ?US RENAL ? ?Result Date: 02/01/2022 ?CLINICAL DATA:  Acute kidney injury EXAM: RENAL / URINARY TRACT ULTRASOUND COMPLETE COMPARISON:  02/18/2016 abdomen ultrasound FINDINGS: Right Kidney: Renal measurements: 10.6 x 3.9 x 4.5 cm = volume: 98 mL. Echogenicity within normal limits. No mass or hydronephrosis visualized. Shadowing, nonobstructing calculi, measuring up to 1.8 cm. Left Kidney: Renal measurements: 11.9 x 3.8.3 cm = volume: 102 mL. Increased echogenicity. No mass or hydronephrosis visualized. Shadowing, nonobstructing calculi, measuring up to 1.2 cm. Bladder: Appears normal for degree of bladder distention. Other: None. IMPRESSION: 1. Increased echogenicity in the left kidney, as can be seen with medical renal disease. 2. Nonobstructing calculi in the bilateral kidneys, measuring up to 1.8 cm on the right. Electronically Signed   By: Merilyn Baba M.D.   On: 02/01/2022 18:29  ? ?DG Chest Port 1 View ? ?Result Date: 02/01/2022 ?CLINICAL DATA:  Shortness of breath for 2 weeks. Worse when lying down. EXAM: PORTABLE CHEST 1 VIEW COMPARISON:  Chest radiograph dated September 16, 2021 FINDINGS: The heart is enlarged. Atherosclerotic calcification of the aortic arch. Pulmonary vascular congestion and bilateral lower lobe hazy opacities concerning for mild pulmonary edema or infiltrate. Small bilateral pleural effusions. No acute osseous abnormality. IMPRESSION: 1. Cardiomegaly with pulmonary vascular congestion. 2. Bilateral lower lobe hazy opacities concerning for mild pulmonary edema or infiltrate. Small bilateral pleural effusions. Electronically Signed    By: Keane Police D.O.   On: 02/01/2022 15:51  ? ?VAS US RENAL ARTERY DUPLEX ? ?Result Date: 02/02/2022 ?ABDOMINAL VISCERAL Patient Name:  MARQUIA COSTELLO  Date of Exam:   02/02/2022 Medical Rec #: [id number]           Accession #:    [id number] Date of Birth: 1956/03/10          Patient Gender: F Patient Age:   42 years Exam Location:  [facility] Procedure:      VAS US RENAL ARTERY DUPLEX Referring Phys: [id number] [place] -------------------------------------------------------------------------------- Indications: Hypertension High Risk Factors:  Hypertension, Diabetes, current smoker. Other Factors: CHF. Limitations: Air/bowel gas, overlying pulsatile venous flow, patient somnloence, and patient discomfort. Comparison Study: 02/01/2022 Renal ultrasound general showed bilateral,                   non-obstructing calculi. Performing Technologist: Darlin Coco RDMS, RVT  Examination Guidelines: A complete evaluation includes B-mode imaging, spectral Doppler, color Doppler, and power Doppler as needed of all accessible portions of each vessel. Bilateral testing is considered an integral part of a complete examination. Limited examinations for reoccurring indications may be performed as noted.  Duplex Findings: +----------------------+--------+--------+------+--------+ Mesenteric            PSV cm/sEDV cm/sPlaqueComments +----------------------+--------+--------+------+--------+ Aorta Mid                        29                  +----------------------+--------+--------+------+--------+ Celiac Artery Origin                                 +----------------------+--------+--------+------+--------+ Celiac Artery Proximal          221                  +----------------------+--------+--------+------+--------+ SMA Proximal                    492                  +----------------------+--------+--------+------+--------+ SMA Mid                         140                   +----------------------+--------+--------+------+--------+    +------------------+--------+--------+--------------+ Right Renal ArteryPSV cm/sEDV cm/s   Comment     +------------------+--------+--------+--------------+ Origin

## 2022-02-03 NOTE — Progress Notes (Signed)
Physical Therapy Treatment ?Patient Details ?Name: Gwendolyn [name] ?MRN: [id number] ?DOB: 1956/04/28 ?Today's Date: 02/03/2022 ? ? ?History of Present Illness The pt is a 66 yo female presenting 3/28 with SOB with hypoxia on RA (SpO2 82%). Pt found to have CHF exacerbation and AKI. PMH includes: HTN, HLD, current tobacco use, severe aortic stenosis, COPD, HFpEF, and asthma. ? ?  ?PT Comments  ? ? The pt was agreeable to session, was able to complete longer bout of ambulation without assist when using RW. The pt demos improved balance and reduced need for assist with BUE support, and was able to complete ~150 ft hallway ambulation on RA with SpO2 > 94%. The pt continues to demo poor endurance and dynamic stability without UE support for hallway ambulation, and will therefore continue to benefit from skilled PT to progress mobility and stability to return to independence without need for DME.  ? ?Gait Speed: 0.26ms using RW. (Gait speed <0.638m indicates increased risk of falls and dependence in ADLs) ?  ?Recommendations for follow up therapy are one component of a multi-disciplinary discharge planning process, led by the attending physician.  Recommendations may be updated based on patient status, additional functional criteria and insurance authorization. ? ?Follow Up Recommendations ? No PT follow up ?  ?  ?Assistance Recommended at Discharge PRN  ?Patient can return home with the following Help with stairs or ramp for entrance ?  ?Equipment Recommendations ? None recommended by PT  ?  ?Recommendations for Other Services   ? ? ?  ?Precautions / Restrictions Precautions ?Precautions: Fall ?Precaution Comments: on RA through session, watch SpO2 ?Restrictions ?Weight Bearing Restrictions: No  ?  ? ?Mobility ? Bed Mobility ?Overal bed mobility: Independent ?  ?  ?  ?  ?  ?  ?  ?  ? ?Transfers ?Overall transfer level: Needs assistance ?Equipment used: None, Rolling walker (2 wheels) ?Transfers: Sit to/from Stand ?Sit  to Stand: Min guard ?  ?  ?  ?  ?  ?General transfer comment: able to stand without UE support, no overt LOB but slow to power up ?  ? ?Ambulation/Gait ?Ambulation/Gait assistance: Min guard ?Gait Distance (Feet): 150 Feet ?Assistive device: Rolling walker (2 wheels) ?Gait Pattern/deviations: Step-through pattern, Decreased stride length, Narrow base of support ?Gait velocity: 0.42 m/s ?Gait velocity interpretation: 1.31 - 2.62 ft/sec, indicative of limited community ambulator ?  ?General Gait Details: pt with improved stability with RW, able to complete short bouts of ambulation in the room without UE support or assist. VSS on RA with gait ? ? ? ? ?  ?Balance Overall balance assessment: Needs assistance ?Sitting-balance support: No upper extremity supported, Feet supported ?Sitting balance-Leahy Scale: Good ?  ?  ?Standing balance support: Single extremity supported, During functional activity ?Standing balance-Leahy Scale: Poor ?Standing balance comment: BUE for longer gait, no UE or single UE support for short bout of ambulation in the room with increased instability ?  ?  ?  ?  ?  ?  ?  ?  ?  ?  ?  ?  ? ?  ?Cognition Arousal/Alertness: Awake/alert ?Behavior [facility]: [facility] assessed/performed ?Overall Cognitive Status: Within Functional Limits for tasks assessed ?  ?  ?  ?  ?  ?  ?  ?  ?  ?  ?  ?  ?  ?  ?  ?  ?  ?  ?  ? ?  ?Exercises   ? ?  ?General Comments   ?  ?  ? ?  Pertinent Vitals/Pain Pain Assessment ?Pain Assessment: No/denies pain  ? ? ? ?PT Goals (current goals can now be found in the care plan section) Acute Rehab PT Goals ?Patient Stated Goal: return home and to work ?PT Goal Formulation: With patient ?Time For Goal Achievement: 02/16/22 ?Potential to Achieve Goals: Good ?Progress towards PT goals: Progressing toward goals ? ?  ?Frequency ? ? ? Min 3X/week ? ? ? ?  ?PT Plan Current plan remains appropriate  ? ? ?   ?AM-PAC PT "6 Clicks" Mobility   ?Outcome Measure ? Help needed turning  from your back to your side while in a flat bed without using bedrails?: None ?Help needed moving from lying on your back to sitting on the side of a flat bed without using bedrails?: None ?Help needed moving to and from a bed to a chair (including a wheelchair)?: A Little ?Help needed standing up from a chair using your arms (e.g., wheelchair or bedside chair)?: A Little ?Help needed to walk in hospital room?: A Little ?Help needed climbing 3-5 steps with a railing? : A Little ?6 Click Score: 20 ? ?  ?End of Session Equipment Utilized During Treatment: Oxygen ?Activity Tolerance: Patient tolerated treatment well ?Patient left: in chair;with call bell/phone within reach;with family/visitor present ?Nurse Communication: Mobility status (SpO2 95% on RA) ?PT Visit Diagnosis: Unsteadiness on feet (R26.81) ?  ? ? ?Time: 3151-7616 ?PT Time Calculation (min) (ACUTE ONLY): 26 min ? ?Charges:  $Therapeutic Exercise: 23-37 mins          ?          ? ?West Carbo, PT, DPT  ? ?Acute Rehabilitation Department ?Pager #: [phone] ? ? ?[name] ?02/03/2022, 1:17 PM ? ?

## 2022-02-03 NOTE — Progress Notes (Signed)
Heart Failure Stewardship Pharmacist Progress Note ? ? ?PCP: Jilda Panda, MD ?PCP-Cardiologist: None  ? ? ?HPI:  ?66 yo F with PMH of COPD, tobacco abuse, HTN, aortic stenosis, and CHF. She presented to the ED on 3/28 with shortness of breath and orthopnea. CXR with cardiomegaly and pulmonary vascular congestion. Her last ECHO from 09/2021 with LVEF of 55-60% and G2DD. Renal function declining initiating discussions for HD. ? ?Current HF Medications: ?Diuretic: furosemide 80 mg IV BID ?Other: hydralazine 100 mg TID; Imdur 30 mg daily ? ?Prior to admission HF Medications: ?Diuretic: furosemide 40 mg daily ?ACE/ARB/ARNI: olmesartan 40 mg daily ?Other: hydralazine 50 mg TID ? ?Pertinent Lab Values: ?Serum creatinine 5.84 BUN 54, Potassium 4.9, Sodium 131, BNP 1788.9  ? ?Vital Signs: ?Weight: 143 lbs (admission weight: 142 lbs) ?Blood pressure: 150/50s (goal SBP for renal perfusion 140-150s) ?Heart rate: 70-80s  ?I/O: minimal, 100 ml out since midnight  ? ?Medication Assistance / Insurance Benefits Check: ?Does the patient have prescription insurance?  Yes ?Type of insurance plan: [facility] Medicare ? ?Outpatient Pharmacy:  ?[facility] ?Is the patient willing to use [facility] pharmacy at discharge? Yes ?Is the patient willing to transition their outpatient pharmacy to utilize a [facility] outpatient pharmacy?   Pending ?  ? ?Assessment: ?1. Acute on chronic diastolic CHF (EF 81-44%), due to presumed NICM - tobacco and uncontrolled HTN. NYHA class III symptoms. ?- GDMT limited with AKI - SCr 5.84 (baseline <1). Add furosemide 80 mg IV BID to encourage renal recovery ?- Continue hydralazine 100 mg TID and Imdur 30 mg daily ?  ?Plan: ?1) Medication changes recommended at this time: ?-  GDMT limited with AKI ? ?2) Patient assistance: ?- Lisabeth Register, Jardiance all $47 copay ? ?3)  Education  ?- To be completed prior to discharge ? ?Kerby Nora, PharmD, BCPS ?Heart Failure Stewardship  Pharmacist ?Phone [phone] ? ? ?

## 2022-02-04 ENCOUNTER — Encounter (HOSPITAL_COMMUNITY): Payer: Self-pay | Admitting: Cardiology

## 2022-02-04 DIAGNOSIS — R0603 Acute respiratory distress: Secondary | ICD-10-CM | POA: Diagnosis not present

## 2022-02-04 DIAGNOSIS — I5033 Acute on chronic diastolic (congestive) heart failure: Secondary | ICD-10-CM | POA: Diagnosis not present

## 2022-02-04 DIAGNOSIS — Q251 Coarctation of aorta: Secondary | ICD-10-CM

## 2022-02-04 DIAGNOSIS — N179 Acute kidney failure, unspecified: Secondary | ICD-10-CM | POA: Diagnosis not present

## 2022-02-04 LAB — CBC
HCT: 28.4 % — ABNORMAL LOW (ref 36.0–46.0)
Hemoglobin: 9 g/dL — ABNORMAL LOW (ref 12.0–15.0)
MCH: 25.4 pg — ABNORMAL LOW (ref 26.0–34.0)
MCHC: 31.7 g/dL (ref 30.0–36.0)
MCV: 80.2 fL (ref 80.0–100.0)
Platelets: 204 10*3/uL (ref 150–400)
RBC: 3.54 MIL/uL — ABNORMAL LOW (ref 3.87–5.11)
RDW: 17.2 % — ABNORMAL HIGH (ref 11.5–15.5)
WBC: 11.8 10*3/uL — ABNORMAL HIGH (ref 4.0–10.5)
nRBC: 0 % (ref 0.0–0.2)

## 2022-02-04 LAB — BASIC METABOLIC PANEL
Anion gap: 13 (ref 5–15)
BUN: 69 mg/dL — ABNORMAL HIGH (ref 8–23)
CO2: 18 mmol/L — ABNORMAL LOW (ref 22–32)
Calcium: 8.5 mg/dL — ABNORMAL LOW (ref 8.9–10.3)
Chloride: 97 mmol/L — ABNORMAL LOW (ref 98–111)
Creatinine, Ser: 6.77 mg/dL — ABNORMAL HIGH (ref 0.44–1.00)
GFR, Estimated: 6 mL/min — ABNORMAL LOW (ref >60)
Glucose, Bld: 100 mg/dL — ABNORMAL HIGH (ref 70–99)
Potassium: 4.8 mmol/L (ref 3.5–5.1)
Sodium: 128 mmol/L — ABNORMAL LOW (ref 135–145)

## 2022-02-04 LAB — LIPID PANEL
Cholesterol: 126 mg/dL (ref 0–200)
HDL: 27 mg/dL — ABNORMAL LOW (ref >40)
LDL Cholesterol: 70 mg/dL (ref 0–99)
Total CHOL/HDL Ratio: 4.7 RATIO
Triglycerides: 147 mg/dL (ref <150)
VLDL: 29 mg/dL (ref 0–40)

## 2022-02-04 NOTE — TOC Initial Note (Signed)
Transition of Care (TOC) - Initial/Assessment Note  ? ? ?Patient Details  ?Name: Gwendolyn [name] ?MRN: [id number] ?Date of Birth: Sep 21, 1956 ? ?Transition of Care (TOC) CM/SW Contact:    ?Zenon Mayo, RN ?Phone Number: ?02/04/2022, 3:18 PM ? ?Clinical Narrative:                 ?From home with son who is her support system, indep, still drives, she states her [name] will transport her home at dc. She uses bipap at night, cret still up.  TOC will cont to follow for dc needs.  ? ?Expected Discharge Plan: Home/Self Care ?Barriers to Discharge: Continued Medical Work up ? ? ?Patient Goals and CMS Choice ?Patient states their goals for this hospitalization and ongoing recovery are:: return home ?  ?Choice offered to / list presented to : NA ? ?Expected Discharge Plan and Services ?Expected Discharge Plan: Home/Self Care ?In-house Referral: NA ?Discharge Planning Services: CM Consult ?Post Acute Care Choice: NA ?Living arrangements for the past 2 months: [place] ?                ?  ?DME Agency: NA ?  ?  ?  ?HH Arranged: NA ?  ?  ?  ?  ? ?Prior Living Arrangements/Services ?Living arrangements for the past 2 months: [place] ?Lives with:: Adult Children ?Patient language and need for interpreter reviewed:: Yes ?Do you feel safe going back to the place where you live?: Yes      ?Need for Family Participation in Patient Care: Yes (Comment) ?Care giver support system in place?: Yes (comment) ?  ?Criminal Activity/Legal Involvement Pertinent to Current Situation/Hospitalization: No - Comment as needed ? ?Activities of Daily Living ?Home Assistive Devices/Equipment: None ?ADL Screening (condition at time of admission) ?Patient's cognitive ability adequate to safely complete daily activities?: Yes ?Is the patient deaf or have difficulty hearing?: No ?Does the patient have difficulty seeing, even when wearing glasses/contacts?: No ?Does the patient have difficulty concentrating, remembering, or  making decisions?: No ?Patient able to express need for assistance with ADLs?: Yes ?Does the patient have difficulty dressing or bathing?: No ?Independently performs ADLs?: Yes (appropriate for developmental age) ?Does the patient have difficulty walking or climbing stairs?: No ?Weakness of Legs: None ?Weakness of Arms/Hands: None ? ?Permission Sought/Granted ?  ?  ?   ?   ?   ?   ? ?Emotional Assessment ?Appearance:: Appears stated age ?Attitude/Demeanor/Rapport: Engaged ?Affect (typically observed): Appropriate ?Orientation: : Oriented to  Time, Oriented to Situation, Oriented to Place, Oriented to Self ?Alcohol / Substance Use: Not Applicable ?Psych Involvement: No (comment) ? ?Admission diagnosis:  CHF (congestive heart failure) ([place]) [I50.9] ?Respiratory distress [R06.03] ?Patient Active Problem List  ? Diagnosis Date Noted  ? Abdominal aortic stenosis   ? Peripheral artery disease ([place])   ? H/O renal atherosclerosis   ? Respiratory distress   ? AKI (acute kidney injury) ([place])   ? Acute on chronic diastolic CHF (congestive heart failure) ([place]) 02/01/2022  ? COPD with acute exacerbation ([place]) 02/01/2022  ? CHF (congestive heart failure) ([place]) 02/01/2022  ? ?PCP:  Jilda Panda, MD ?Pharmacy:   ?[id number] - , [address] ?[address] ?[place] [address] ?Phone: [phone] Fax: [phone] ? ?[facility] [phone] - [place], [place] ?[place]  [place] [address] ?Phone: [phone] Fax: [phone] ? ?[facility] ?[address] ?[place] [address] ?Phone: [phone] Fax: [phone] ? ? ? ? ?Social Determinants of Health (SDOH) Interventions ?Food Insecurity Interventions: Intervention Not Indicated ?Financial Strain Interventions: Intervention Not Indicated ?Housing Interventions: Intervention Not  Indicated ?Transportation Interventions: Intervention Not Indicated ? ?Readmission Risk Interventions ? ?  02/04/2022  ?  3:13 PM  ?Readmission Risk Prevention Plan  ?Transportation Screening Complete  ?PCP or Specialist Appt within 3-5 Days Complete  ?[place] or Home Care Consult Complete  ?Social Work Consult for [place] Planning/Counseling Complete  ?Palliative Care Screening Not Applicable  ?Medication Review Press photographer) Complete  ? ? ? ?

## 2022-02-04 NOTE — Progress Notes (Signed)
Pt refusing bipap for the night. RT will continue to monitor as needed. ?

## 2022-02-04 NOTE — Progress Notes (Signed)
Mobility Specialist Progress Note: ? ? 02/04/22 1500  ?Mobility  ?Activity Ambulated with assistance in hallway  ?Level of Assistance Standby assist, set-up cues, supervision of patient - no hands on  ?Assistive Device Front wheel walker  ?Distance Ambulated (ft) 240 ft  ?Activity Response Tolerated well  ?$Mobility charge 1 Mobility  ? ?During Mobility: SpO2 90% RA ? ?Pt eager for mobility session. Ambulated at supervision level with RW on RA with no LOB or SOB noted. Pt back in bed with all needs met, visitor present.  ? ?Nelta Numbers ?Acute Rehab ?Phone: [phone] ?Office Phone: [phone] ? ?

## 2022-02-04 NOTE — Care Management Important Message (Signed)
Important Message ? ?Patient Details  ?Name: Gwendolyn [name] ?MRN: [id number] ?Date of Birth: [birth year] ? ? ?Medicare Important Message Given:  Yes ? ? ? ? ?Shelda Altes ?02/04/2022, 7:54 AM ?

## 2022-02-04 NOTE — Progress Notes (Signed)
Heart Failure Stewardship Pharmacist Progress Note ? ? ?PCP: Jilda Panda, MD ?PCP-Cardiologist: None  ? ? ?HPI:  ?66 yo F with PMH of COPD, tobacco abuse, HTN, aortic stenosis, and CHF. She presented to the ED on 3/28 with shortness of breath and orthopnea. CXR with cardiomegaly and pulmonary vascular congestion. Her last ECHO from 09/2021 with LVEF of 55-60% and G2DD. Renal function declining initiating discussions for HD. ? ?Current HF Medications: ?Other: Imdur 30 mg daily ? ?Prior to admission HF Medications: ?Diuretic: furosemide 40 mg daily ?ACE/ARB/ARNI: olmesartan 40 mg daily ?Other: hydralazine 50 mg TID ? ?Pertinent Lab Values: ?Serum creatinine 6.77, BUN 69, Potassium 4.8, Sodium 128, BNP 1788.9  ? ?Vital Signs: ?Weight: 143 lbs (admission weight: 142 lbs) ?Blood pressure: 140/50s (goal SBP for renal perfusion 140-150s) ?Heart rate: 70-80s  ?I/O: minimal, 350 ml out yesterday, -1.4L since midnight. Net -1.4L ? ?Medication Assistance / Insurance Benefits Check: ?Does the patient have prescription insurance?  Yes ?Type of insurance plan: [facility] Medicare ? ?Outpatient Pharmacy:  ?[facility] ?Is the patient willing to use [facility] pharmacy at discharge? Yes ?Is the patient willing to transition their outpatient pharmacy to utilize a [facility] outpatient pharmacy?   Pending ?  ? ?Assessment: ?1. Acute on chronic diastolic CHF (EF 07-62%), due to presumed NICM - tobacco and uncontrolled HTN. NYHA class III symptoms. ?- GDMT limited with AKI - SCr 6.77 (baseline <1) ?- Off hydralazine 100 mg TID  ?- Continue Imdur 30 mg daily ?  ?Plan: ?1) Medication changes recommended at this time: ?-  GDMT limited with AKI ? ?2) Patient assistance: ?- Lisabeth Register, Jardiance all $47 copay ? ?3)  Education  ?- To be completed prior to discharge ? ?Kerby Nora, PharmD, BCPS ?Heart Failure Stewardship Pharmacist ?Phone [phone] ? ? ?

## 2022-02-04 NOTE — Progress Notes (Signed)
Subjective:  ?Patient seen and examined at approximately 8:15 AM  ?Patient resting in bed, tolerating room air  ?Appears much improved compared to yesterday  ?Cr has trended up further, nephrology continues to follow  ?Blood pressure control has improved  ? ?Intake/Output from previous day: ? ?I/O last 3 completed shifts: ?In: 0  ?Out: 1000 [Urine:900; Stool:100] ?Total I/O ?In: 240 [P.O.:240] ?Out: 800 [Urine:800] ? ?Blood pressure (!) 149/45, pulse 74, temperature 98.6 ?F (37 ?C), temperature source Oral, resp. rate 19, height 4' 11.5" (1.511 m), weight 65.2 kg, SpO2 96 %. ?Physical Exam ?Vitals and nursing note reviewed.  ?Cardiovascular:  ?   Rate and Rhythm: Normal rate and regular rhythm.  ?   Pulses:     ?     Carotid pulses are  on the right side with bruit and  on the left side with bruit. ?     Dorsalis pedis pulses are 0 on the right side and 0 on the left side.  ?     Posterior tibial pulses are 1+ on the right side and 1+ on the left side.  ?   Heart sounds: S1 normal and S2 normal. No murmur heard. ?  No gallop.  ?Pulmonary:  ?   Effort: Pulmonary effort is normal. No respiratory distress.  ?   Breath sounds: Decreased air movement (bilaterally) present. No wheezing.  ?   Comments: On room air  ?Musculoskeletal:  ?   Right lower leg: No edema.  ?   Left lower leg: No edema.  ?Neurological:  ?   Mental Status: She is alert.  ? ?Lab Results: ?BMP ?BNP (last 3 results) ?Recent Labs  ?  08/10/21 ?1325 02/01/22 ?1520  ?BNP 147.8* 1,788.9*  ? ? ? ?ProBNP (last 3 results) ?No results for input(s): PROBNP in the last 8760 hours. ? ?  Latest Ref Rng & Units 02/04/2022  ?  4:25 AM 02/03/2022  ?  6:05 AM 02/02/2022  ?  4:35 AM  ?BMP  ?Glucose 70 - 99 mg/dL 100   107   131    ?BUN 8 - 23 mg/dL 69   54   33    ?Creatinine 0.44 - 1.00 mg/dL 6.77   5.84   3.14    ?Sodium 135 - 145 mmol/L 128   131   133    ?Potassium 3.5 - 5.1 mmol/L 4.8   4.9   4.9    ?Chloride 98 - 111 mmol/L 97   99   105    ?CO2 22 - 32 mmol/L $RemoveB'18    19   17    'otXVWymO$ ?Calcium 8.9 - 10.3 mg/dL 8.5   8.8   8.9    ? ? ?  Latest Ref Rng & Units 02/01/2022  ?  3:20 PM 08/27/2021  ? 11:39 AM 08/10/2021  ?  1:25 PM  ?Hepatic Function  ?Total Protein 6.5 - 8.1 g/dL 6.8   6.5   7.6    ?Albumin 3.5 - 5.0 g/dL 4.2   4.0   4.5    ?AST 15 - 41 U/L $Remo'21   20   20    'gebpZ$ ?ALT 0 - 44 U/L $Remo'17   21   20    'dxRCP$ ?Alk Phosphatase 38 - 126 U/L 71   60   60    ?Total Bilirubin 0.3 - 1.2 mg/dL 0.6   0.4   0.3    ? ? ?  Latest Ref Rng & Units 02/04/2022  ?  4:25 AM 02/03/2022  ?  6:05 AM 02/02/2022  ? 11:35 AM  ?CBC  ?WBC 4.0 - 10.5 K/uL 11.8   16.0   11.2    ?Hemoglobin 12.0 - 15.0 g/dL 9.0   9.2   9.1    ?Hematocrit 36.0 - 46.0 % 28.4   28.1   28.2    ?Platelets 150 - 400 K/uL 204   217   210    ? ?Lipid Panel  ?   ?Component Value Date/Time  ? CHOL 126 02/04/2022 0425  ? TRIG 147 02/04/2022 0425  ? HDL 27 (L) 02/04/2022 0425  ? CHOLHDL 4.7 02/04/2022 0425  ? VLDL 29 02/04/2022 0425  ? [place] 70 02/04/2022 0425  ? ?Cardiac Panel (last 3 results) ?No results for input(s): CKTOTAL, CKMB, TROPONINI, RELINDX in the last 72 hours. ? ?HEMOGLOBIN A1C ?No results found for: HGBA1C, MPG ?TSH ?Recent Labs  ?  08/27/21 ?1351 02/01/22 ?1522  ?TSH 0.706 0.225*  ? ? ?Imaging: ?Chest x-ray 02/02/2022:  ?Interval improved pulmonary vascular congestion. Small bilateral ?pleural effusions. ? ?CT abdomen/pelvis 08/27/2021:  ?1. Very severe calcified atherosclerosis of the abdominal aorta with ?NEAR AORTIC OCCLUSION at the SMA level. ?Associated High-grade Stenosis of nearly all major aortic branch ?origins. And high-grade stenosis of the left Common iliac artery ?also due to bulky calcified plaque. ?Recommend Vascular Surgery Consultation. Aortic Atherosclerosis ?(ICD10-I70.0). ?  ?2. Superimposed mild inflammatory stranding in the right upper ?quadrant, near the porta hepatis and hepatic flexure of colon. This ?might be a mild Acute Colitis - perhaps a low-grade ischemic colitis ?in light of #1, uncertain. ?  ?3. Trace  bilateral pleural effusions, larger on the right. Cardiac ?enlargement since 2008, borderline cardiomegaly with trace ?pericardial fluid. ?  ?4. No other acute or inflammatory process identified in the abdomen ?or pelvis. ?Bilateral nephrolithiasis with developing staghorn calculus in the ?right lower pole. ?Chronic postinflammatory calcification of upper mesenteric lymph ?nodes, an area affected by acute pancreatitis in 2008. ? ?Renal ultrasound 02/01/2022:  ?1. Increased echogenicity in the left kidney, as can be seen with ?medical renal disease. ?2. Nonobstructing calculi in the bilateral kidneys, measuring up to ?1.8 cm on the right. ? ?Cardiac Studies: ?Abdominal arteriogram and selective left renal arteriogram and lithotripsy balloon angioplasty of the suprarenal abdominal aorta using shockwave M5 catheter 01/24/2022: ?Abdominal aorta: 2 renal arteries 1 on either sides, mild atherosclerotic changes are present.  Abdominal aorta is diffusely diseased and calcific.  There is a high-grade 90% to 99% stenosis of the suprarenal abdominal aorta with heavy calcification. ?Intervention data: Successful shockwave lithotripsy using a 8.0 x 60 mm shockwave M5 balloon, gradual progression of balloon inflation pressure from 4 atmospheric pressure already up to 10 atm pressure and using a 8.43 mm lumen, followed by balloon angioplasty with a 4.0 x 60 mm Mustang balloon at 4 atmospheric pressure. ?Stenosis reduced from 95% to probably insignificant stenosis (<50%).  Angiogram not performed to conserve contrast and would have been poorly visualized with CO2. ?  ?Pressure gradient pre and post:  ?Opening right common femoral artery pressure 52/44 mmHg.  Suprarenal thoracic aortic pressure 180/58 mmHg with a mean of 103 mmHg.  Pressure gradient of approximately 130 mmHg.  Closing right femoral artery pressure 121/62 mmHg.  Pressure gradient approximately 30 mmHg post angioplasty. ? ?5-10 mL contrast utilized 20 to visualize  abdominal aorta.  Right femoral arterial access closed with Perclose. ? ?Recommendation: Start the patient on Plavix, I expect improvement in renal function, do not  treat hypertension unless blood pressure >160 mmHg.  We will change all the antihypertensive medications. ? ?Renal artery duplex 02/02/2022:  ?Right: Normal right Resisitive Index. No evidence of right renal  ?       artery stenosis. RRV flow present. Normal size right kidney.  ?Left:  Normal left Resistive Index. 1-59% stenosis of the left renal  ?       artery. LRV flow present. Normal size of left kidney.  ? ?Echocardiogram 02/01/2022: ?Study interrupted due to severe shortness of breath. ?Limited images in parasternal long axis show ejection fraction around 45-50%. ?  ?Echocardiogram 09/09/2021:  ?Normal LV systolic function with visual EF 55-60%. Left ventricle cavity  ?is normal in size. Mild left ventricular hypertrophy. Normal global wall  ?motion. Doppler evidence of grade II diastolic dysfunction, elevated LAP.  ?Left atrial cavity is severely dilated.  ?Aortic valve sclerosis without stenosis.  ?Mild (Grade I) aortic regurgitation.  ?Mild (Grade I) mitral regurgitation.  ?Mild tricuspid regurgitation. No evidence of pulmonary hypertension.  ?Small circumferential pericardial effusion. There is no hemodynamic  ?significance.  ?IVC is dilated with a respiratory response of >50%.  ?No prior study for comparison. ?  ?EKG 02/01/2022:: ?Sinus rhythm 90 bpm ?Left atrial enlargement ?LVH ?Possible old anterior infarct ? ?Telemetry:  ?NSR, PVC ? ?Scheduled Meds: ? amLODipine  10 mg Oral Daily  ? atorvastatin  80 mg Oral QHS  ? clopidogrel  75 mg Oral Q breakfast  ? enoxaparin (LOVENOX) injection  30 mg Subcutaneous Q24H  ? feeding supplement  1 Container Oral TID BM  ? fluticasone furoate-vilanterol  1 puff Inhalation Daily  ? ipratropium-albuterol  3 mL Nebulization Q6H  ? isosorbide mononitrate  30 mg Oral Daily  ? labetalol  50 mg Oral BID  ?  levothyroxine  100 mcg Oral QAC breakfast  ? multivitamin with minerals  1 tablet Oral Daily  ? nicotine  21 mg Transdermal Daily  ? sodium chloride flush  3 mL Intravenous Q12H  ? sodium chloride flush  3 mL Cecille Aver

## 2022-02-04 NOTE — Progress Notes (Signed)
?PROGRESS NOTE ? ? ? ?Gwendolyn [name]  YIR:[id number] DOB: 1955-12-08 DOA: 02/01/2022 ?PCP: Jilda Panda, MD  ?Narrative: 65/F with history of COPD, tobacco abuse, resistant hypertension, high-grade distal aortic stenosis, chronic diastolic CHF presented to the ED 3/28 with worsening dyspnea.  Recently Lasix dose was increased to 40 Mg daily. ?-In the ED she was acutely dyspneic, hypoxic, chest x-ray noted pulmonary vascular congestion and interstitial edema, labs noted BNP of 1788, creatinine of 1.9 up from baseline of 0.7 ?- w/ SEVERE OLIGURIC AKI  ?-3/30 underwent angioplasty of suprarenal abdominal aortic stenosis ? ?Subjective: ?-Overnight had dyspnea, required BiPAP ? ?Assessment and Plan: ? ?Acute kidney injury ?-Baseline creatinine around 0.7,->1.9> 3.1>5.8>6.7 ?-Urine output improving, 800 mL yesterday ?-This was felt to be hemodynamically mediated, secondary to high-grade suprarenal abdominal aortic stenosis>95%, ongoing ARB use ?-Improved urine output is reassuring ?-Hopeful for ongoing renal recovery, avoid hypotension ?-Nephrology following, BMP in a.m. ? ?Suprarenal abdominal aortic stenosis ?-High-grade, 90-99%, treated with shockwave lithotripsy, balloon angioplasty stenosis reduced from 95% to<50% per Dr.Ganji 3/30 ?-Continue Plavix ?-Avoid hypotension ? ?Acute hypoxic respiratory failure ?Acute diastolic CHF ?-Recent echo 11/22 noted EF of 55-60% ?-Improving, see discussion above ? ?Uncontrolled hypertension ?-Improving, continue Imdur, amlodipine, labetalol-dose decreased ?-Avoid hypotension, goal BP 140-160 ? ?COPD ?Tobacco abuse ?-Clinically do not suspect COPD exacerbation at this time, will discontinue Levaquin and monitor ?-Continue ICS/LABA, DuoNebs ?-Counseled ? ?Hypothyroidism ?-Continue Synthroid ? ?DVT prophylaxis: Lovenox ?Code Status: Full code ?Family Communication: Discussed with patient in detail, daughter at bedside ?Disposition Plan: To be determined ? ?Consultants:   ?Nephrology, cardiology ? ?Procedures: ? ?Dr.Ganji: Abdominal arteriogram and selective left renal arteriogram and lithotripsy balloon angioplasty of the suprarenal abdominal aorta using shockwave M5 catheter 01/24/2022: ?Abdominal aorta: 2 renal arteries 1 on either sides, mild atherosclerotic changes are present.  Abdominal aorta is diffusely diseased and calcific.  There is a high-grade 90% to 99% stenosis of the suprarenal abdominal aorta with heavy calcification. ?Intervention data: Successful shockwave lithotripsy using a 8.0 x 60 mm shockwave M5 balloon, gradual progression of balloon inflation pressure from 4 atmospheric pressure already up to 10 atm pressure and using a 8.43 mm lumen, followed by balloon angioplasty with a 4.0 x 60 mm Mustang balloon at 4 atmospheric pressure. ?Stenosis reduced from 95% to probably insignificant stenosis (<50%).  Angiogram not performed to conserve contrast and would have been poorly visualized with CO2. ? ?Antimicrobials:  ? ? ?Objective: ?Vitals:  ? 02/04/22 0410 02/04/22 0825 02/04/22 0934 02/04/22 1148  ?BP: (!) 143/51 (!) 149/45  (!) 151/50  ?Pulse: 85 74  74  ?Resp: $Remov'19 19  17  'OqzSrq$ ?Temp: 97.7 ?F (36.5 ?C) 98.6 ?F (37 ?C)  98.5 ?F (36.9 ?C)  ?TempSrc: Oral Oral  Oral  ?SpO2: 93% 93% 96% 95%  ?Weight:      ?Height:      ? ? ?Intake/Output Summary (Last 24 hours) at 02/04/2022 1230 ?Last data filed at 02/04/2022 1151 ?Gross per 24 hour  ?Intake 240 ml  ?Output 2400 ml  ?Net -2160 ml  ? ?Filed Weights  ? 02/03/22 0500 02/04/22 0000 02/04/22 0324  ?Weight: 65.2 kg 65.2 kg 65.2 kg  ? ? ?Examination: ? ?General exam: Chronically ill female sitting up in bed, AAOx3, no distress ?HEENT: Positive JVD ?CVS: S1-S2, regular rhythm ?Lungs: Poor air movement bilaterally ?Abdomen: Soft, nontender, bowel sounds present ?Extremities: No edema ?Skin: No rashes ?Psychiatry: Judgement and insight appear normal. Mood & affect appropriate.  ? ? ? ?Data Reviewed:  ? ?  CBC: ?Recent Labs  ?Lab  02/01/22 ?1520 02/01/22 ?1830 02/02/22 ?1135 02/03/22 ?4536 02/04/22 ?0425  ?WBC 21.2*  --  11.2* 16.0* 11.8*  ?NEUTROABS 18.0*  --  9.8*  --   --   ?HGB 11.3* 10.9* 9.1* 9.2* 9.0*  ?HCT 35.4* 32.0* 28.2* 28.1* 28.4*  ?MCV 82.5  --  80.6 79.4* 80.2  ?PLT 242  --  210 217 204  ? ?Basic Metabolic Panel: ?Recent Labs  ?Lab 02/01/22 ?1520 02/01/22 ?1830 02/02/22 ?0435 02/03/22 ?4680 02/04/22 ?0425  ?NA 135 137 133* 131* 128*  ?K 4.4 4.6 4.9 4.9 4.8  ?CL 105  --  105 99 97*  ?CO2 21*  --  17* 19* 18*  ?GLUCOSE 159*  --  131* 107* 100*  ?BUN 25*  --  33* 54* 69*  ?CREATININE 1.99*  --  3.14* 5.84* 6.77*  ?CALCIUM 9.2  --  8.9 8.8* 8.5*  ? ?GFR: ?Estimated Creatinine Clearance: 6.8 mL/min (A) (by C-G formula based on SCr of 6.77 mg/dL (H)). ?Liver Function Tests: ?Recent Labs  ?Lab 02/01/22 ?1520  ?AST 21  ?ALT 17  ?ALKPHOS 71  ?BILITOT 0.6  ?PROT 6.8  ?ALBUMIN 4.2  ? ?No results for input(s): LIPASE, AMYLASE in the last 168 hours. ?No results for input(s): AMMONIA in the last 168 hours. ?Coagulation Profile: ?No results for input(s): INR, PROTIME in the last 168 hours. ?Cardiac Enzymes: ?No results for input(s): CKTOTAL, CKMB, CKMBINDEX, TROPONINI in the last 168 hours. ?BNP (last 3 results) ?No results for input(s): PROBNP in the last 8760 hours. ?HbA1C: ?No results for input(s): HGBA1C in the last 72 hours. ?CBG: ?No results for input(s): GLUCAP in the last 168 hours. ?Lipid Profile: ?Recent Labs  ?  02/04/22 ?0425  ?CHOL 126  ?HDL 27*  ?[place] 70  ?TRIG 147  ?CHOLHDL 4.7  ? ?Thyroid Function Tests: ?Recent Labs  ?  02/01/22 ?1520 02/01/22 ?1522  ?TSH  --  0.225*  ?FREET4 1.34*  --   ? ?Anemia Panel: ?No results for input(s): VITAMINB12, FOLATE, FERRITIN, TIBC, IRON, RETICCTPCT in the last 72 hours. ?Urine analysis: ?   ?Component Value Date/Time  ? COLORURINE YELLOW 08/27/2021 1204  ? APPEARANCEUR CLEAR 08/27/2021 1204  ? LABSPEC >1.046 (H) 08/27/2021 1204  ? PHURINE 6.0 08/27/2021 1204  ? GLUCOSEU NEGATIVE 08/27/2021  1204  ? HGBUR NEGATIVE 08/27/2021 1204  ? [place] NEGATIVE 08/27/2021 1204  ? [place] NEGATIVE 08/27/2021 1204  ? PROTEINUR NEGATIVE 08/27/2021 1204  ? NITRITE NEGATIVE 08/27/2021 1204  ? LEUKOCYTESUR SMALL (A) 08/27/2021 1204  ? ?Sepsis Labs: ?$RemoveBefore'@LABRCNTIP'UmMyIOtpYxqyf$ (procalcitonin:4,lacticidven:4) ? ?) ?Recent Results (from the past 240 hour(s))  ?Resp Panel by RT-PCR (Flu A&B, Covid) Nasopharyngeal Swab     Status: None  ? Collection Time: 02/01/22  3:21 PM  ? Specimen: Nasopharyngeal Swab; Nasopharyngeal(NP) swabs in vial transport medium  ?Result Value Ref Range Status  ? SARS Coronavirus 2 by RT PCR NEGATIVE NEGATIVE Final  ?  Comment: (NOTE) ?SARS-CoV-2 target nucleic acids are NOT DETECTED. ? ?The SARS-CoV-2 RNA is generally detectable in upper respiratory ?specimens during the acute phase of infection. The lowest ?concentration of SARS-CoV-2 viral copies this assay can detect is ?138 copies/mL. A negative result does not preclude SARS-Cov-2 ?infection and should not be used as the sole basis for treatment or ?other patient management decisions. A negative result may occur with  ?improper specimen collection/handling, submission of specimen other ?than nasopharyngeal swab, presence of viral mutation(s) within the ?areas targeted by this assay, and inadequate number  of viral ?copies(<138 copies/mL). A negative result must be combined with ?clinical observations, patient history, and epidemiological ?information. The expected result is Negative. ? ?Fact Sheet for Patients:  ?EntrepreneurPulse.com.au ? ?Fact Sheet for Healthcare Providers:  ?IncredibleEmployment.be ? ?This test is no t yet approved or cleared by the [place] FDA and  ?has been authorized for detection and/or diagnosis of SARS-CoV-2 by ?FDA under an Emergency Use Authorization (EUA). This EUA will remain  ?in effect (meaning this test can be used) for the duration of the ?COVID-19 declaration under Section  564(b)(1) of the Act, 21 ?U.S.C.section 360bbb-3(b)(1), unless the authorization is terminated  ?or revoked sooner.  ? ? ?  ? Influenza A by PCR NEGATIVE NEGATIVE Final  ? Influenza B by PCR NEGATIVE NEGATIVE Final  ?  Comme

## 2022-02-04 NOTE — Progress Notes (Signed)
Subjective:  procedure yest per Dr. Einar Gip- widely patent renal arteries-  suprarenal AA stenosis was angioplastied.  UOP at least 800-  BUN and crt up but not yet to dangerous levels-  she looks fantastic-  no O2 ? ?Objective ?Vital signs in last 24 hours: ?Vitals:  ? 02/04/22 0000 02/04/22 0324 02/04/22 0410 02/04/22 0825  ?BP: 130/69  (!) 143/51 (!) 149/45  ?Pulse: 75  85 74  ?Resp: _0 ?Temp: 98 ?F (36.7 ?C)  97.7 ?F (36.5 ?C) 98.6 ?F (37 ?C)  ?TempSrc: Oral  Oral Oral  ?SpO2: 93%  93% 93%  ?Weight: 65.2 kg 65.2 kg    ?Height:      ? ?Weight change: 0.027 kg ? ?Intake/Output Summary (Last 24 hours) at 02/04/2022 0910 ?Last data filed at 02/04/2022 6789 ?Gross per 24 hour  ?Intake 240 ml  ?Output 1700 ml  ?Net -1460 ml  ? ? ?Assessment/Plan: 66 year old WF with COPD, HTN and diastolic heart failure presenting with AKI along with SOB ?1.Renal- normal crt in October-  she also reports very recent labs where her kidney function was normal but unable to confirm.  Now with AKI in the setting of SOB and malignant HTN.  Crt has worsened over the last 72 hours or so-  she was anuric- now non oliguric.  Ultrasound is not very remarkable, nor is duplex.  There is no urine for review.   I suspect that this is hemodynamically mediated.  Initially had a malignant HTN- SBP 214, then lowered to 381 systolic with an ARB on board.  Question of this significance of previously described distal aortic dz-  Dr. Einar Gip did angioplasty with good results.   holding further ARB and trying to keep BP in this range even 140- 160 so we can set the stage for the kidneys to recover.  No uop was very concerning-  now clearly nonoliguric and does not seem to be uremic- even though renal function worsened I am hoping will see plateau and improvement soon  no absolute indications for dialysis and hope that we wont need to do ?2. Hypertension/volume  - I understand that initial CXR read as pulmonary edema.  high BP at the time exacerbated the  issue but now that her BP is better she is better.  She doesn't seem globally overloaded-  no edema -  air movement is poor at baseline but clinically looks better.  Got lasix yesterday with good results-  I dont think needs any more right now ?3. Elytes and acidosis-  does have acidosis and K is on the upper limit of normal-  stable so far ?4. Anemia  - hgb 9.0 not helping her SOB-  supportive care ?5. Nausea-  resolved so did not seem to be due to uremia  ?6. Hyponatremia-  due to inability to regulate kidney wise with ATN- I dont think is hypervolemia hyponatremia ?  ? ? ?Louis Meckel  ? ? ?Labs: ?Basic Metabolic Panel: ?Recent Labs  ?Lab 02/02/22 ?0435 02/03/22 ?0605 02/04/22 ?0425  ?NA 133* 131* 128*  ?K 4.9 4.9 4.8  ?CL 105 99 97*  ?CO2 17* 19* 18*  ?GLUCOSE 131* 107* 100*  ?BUN 33* 54* 69*  ?CREATININE 3.14* 5.84* 6.77*  ?CALCIUM 8.9 8.8* 8.5*  ? ?Liver Function Tests: ?Recent Labs  ?Lab 02/01/22 ?1520  ?AST 21  ?ALT 17  ?ALKPHOS 71  ?BILITOT 0.6  ?PROT 6.8  ?ALBUMIN 4.2  ? ?No results for input(s): LIPASE,  AMYLASE in the last 168 hours. ?No results for input(s): AMMONIA in the last 168 hours. ?CBC: ?Recent Labs  ?Lab 02/01/22 ?1520 02/01/22 ?1830 02/02/22 ?1135 02/03/22 ?2774 02/04/22 ?0425  ?WBC 21.2*  --  11.2* 16.0* 11.8*  ?NEUTROABS 18.0*  --  9.8*  --   --   ?HGB 11.3*   < > 9.1* 9.2* 9.0*  ?HCT 35.4*   < > 28.2* 28.1* 28.4*  ?MCV 82.5  --  80.6 79.4* 80.2  ?PLT 242  --  210 217 204  ? < > = values in this interval not displayed.  ? ?Cardiac Enzymes: ?No results for input(s): CKTOTAL, CKMB, CKMBINDEX, TROPONINI in the last 168 hours. ?CBG: ?No results for input(s): GLUCAP in the last 168 hours. ? ?Iron Studies: No results for input(s): IRON, TIBC, TRANSFERRIN, FERRITIN in the last 72 hours. ?Studies/Results: ?PERIPHERAL VASCULAR CATHETERIZATION ? ?Result Date: 02/03/2022 ?Abdominal arteriogram and selective left renal arteriogram and lithotripsy balloon angioplasty of the suprarenal abdominal  aorta using shockwave M5 catheter 01/24/2022: Abdominal aorta: 2 renal arteries 1 on either sides, mild atherosclerotic changes are present.  Abdominal aorta is diffusely diseased and calcific.  There is a high-grade 90% to 99% stenosis of the suprarenal abdominal aorta with heavy calcification. Intervention data: Successful shockwave lithotripsy using a 8.0 x 60 mm shockwave M5 balloon, gradual progression of balloon inflation pressure from 4 atmospheric pressure already up to 10 atm pressure and using a 8.43 mm lumen, followed by balloon angioplasty with a 4.0 x 60 mm Mustang balloon at 4 atmospheric pressure. Stenosis reduced from 95% to probably insignificant stenosis (<50%).  Angiogram not performed to conserve contrast and would have been poorly visualized with CO2. Pressure gradient pre and post: Opening right common femoral artery pressure 52/44 mmHg.  Suprarenal thoracic aortic pressure 180/58 mmHg with a mean of 103 mmHg.  Pressure gradient of approximately 130 mmHg.  Closing right femoral artery pressure 121/62 mmHg.  Pressure gradient approximately 30 mmHg post angioplasty. 5-10 mL contrast utilized 20 to visualize abdominal aorta.  Right femoral arterial access closed with Perclose. Recommendation: Start the patient on Plavix, I expect improvement in renal function, do not treat hypertension unless blood pressure >160 mmHg.  We will change all the antihypertensive medications.  ? ?VAS US RENAL ARTERY DUPLEX ? ?Result Date: 02/02/2022 ?ABDOMINAL VISCERAL Patient Name:  FINOLA ROSAL  Date of Exam:   02/02/2022 Medical Rec #: [id number]           Accession #:    [id number] Date of Birth: September 25, 1956          Patient Gender: F Patient Age:   66 years Exam Location:  [facility] Procedure:      VAS US RENAL ARTERY DUPLEX Referring Phys: [id number] Bison -------------------------------------------------------------------------------- Indications: Hypertension High Risk Factors:  Hypertension, Diabetes, current smoker. Other Factors: CHF. Limitations: Air/bowel gas, overlying pulsatile venous flow, patient somnloence, and patient discomfort. Comparison Study: 02/01/2022 Renal ultrasound general showed bilateral,                   non-obstructing calculi. Performing Technologist: Darlin Coco RDMS, RVT  Examination Guidelines: A complete evaluation includes B-mode imaging, spectral Doppler, color Doppler, and power Doppler as needed of all accessible portions of each vessel. Bilateral testing is considered an integral part of a complete examination. Limited examinations for reoccurring indications may be performed as noted.  Duplex Findings: +----------------------+--------+--------+------+--------+ Mesenteric            PSV  cm/sEDV cm/sPlaqueComments +----------------------+--------+--------+------+--------+ Aorta Mid                        29                  +----------------------+--------+--------+------+--------+ Celiac Artery Origin                                 +----------------------+--------+--------+------+--------+ Celiac Artery Proximal          221                  +----------------------+--------+--------+------+--------+ SMA Proximal                    492                  +----------------------+--------+--------+------+--------+ SMA Mid                         140                  +----------------------+--------+--------+------+--------+    +------------------+--------+--------+--------------+ Right Renal ArteryPSV cm/sEDV cm/s   Comment     +------------------+--------+--------+--------------+ Origin                            Not visualized +------------------+--------+--------+--------------+ Proximal             35      10                  +------------------+--------+--------+--------------+ Mid                  26      10                  +------------------+--------+--------+--------------+ Distal                35      9                   +------------------+--------+--------+--------------+ +-----------------+--------+--------+-------+ Left Renal ArteryPSV cm/sEDV cm/sComment +-----------------+--------+--------+-------+ Origin              96      22           +-----------

## 2022-02-05 DIAGNOSIS — N179 Acute kidney failure, unspecified: Secondary | ICD-10-CM | POA: Diagnosis not present

## 2022-02-05 DIAGNOSIS — I5033 Acute on chronic diastolic (congestive) heart failure: Secondary | ICD-10-CM | POA: Diagnosis not present

## 2022-02-05 DIAGNOSIS — Q251 Coarctation of aorta: Secondary | ICD-10-CM | POA: Diagnosis not present

## 2022-02-05 DIAGNOSIS — R0603 Acute respiratory distress: Secondary | ICD-10-CM | POA: Diagnosis not present

## 2022-02-05 LAB — ECHOCARDIOGRAM COMPLETE
Area-P 1/2: 4.15 cm2
Height: 59.5 in
P 1/2 time: 259 msec
S' Lateral: 3.8 cm
Weight: 2299.84 oz

## 2022-02-05 LAB — GLUCOSE, CAPILLARY: Glucose-Capillary: 108 mg/dL — ABNORMAL HIGH (ref 70–99)

## 2022-02-05 LAB — BASIC METABOLIC PANEL
Anion gap: 11 (ref 5–15)
BUN: 72 mg/dL — ABNORMAL HIGH (ref 8–23)
CO2: 19 mmol/L — ABNORMAL LOW (ref 22–32)
Calcium: 8.6 mg/dL — ABNORMAL LOW (ref 8.9–10.3)
Chloride: 102 mmol/L (ref 98–111)
Creatinine, Ser: 4.83 mg/dL — ABNORMAL HIGH (ref 0.44–1.00)
GFR, Estimated: 9 mL/min — ABNORMAL LOW (ref >60)
Glucose, Bld: 100 mg/dL — ABNORMAL HIGH (ref 70–99)
Potassium: 4.5 mmol/L (ref 3.5–5.1)
Sodium: 132 mmol/L — ABNORMAL LOW (ref 135–145)

## 2022-02-05 MED ORDER — LABETALOL HCL 100 MG PO TABS
100.0000 mg | ORAL_TABLET | Freq: Two times a day (BID) | ORAL | Status: DC
  Administered 2022-02-05 – 2022-02-06 (×4): 100 mg via ORAL
  Filled 2022-02-05 (×4): qty 1

## 2022-02-05 MED ORDER — ASPIRIN EC 81 MG PO TBEC
81.0000 mg | DELAYED_RELEASE_TABLET | Freq: Every day | ORAL | Status: DC
  Administered 2022-02-05 – 2022-02-07 (×3): 81 mg via ORAL
  Filled 2022-02-05 (×3): qty 1

## 2022-02-05 MED ORDER — IPRATROPIUM-ALBUTEROL 0.5-2.5 (3) MG/3ML IN SOLN
3.0000 mL | Freq: Three times a day (TID) | RESPIRATORY_TRACT | Status: DC
Start: 2022-02-05 — End: 2022-02-06
  Administered 2022-02-05 (×2): 3 mL via RESPIRATORY_TRACT
  Filled 2022-02-05 (×4): qty 3

## 2022-02-05 NOTE — Progress Notes (Signed)
Subjective:  UOP 3600-  crt down !  Looks great again  ? ?Objective ?Vital signs in last 24 hours: ?Vitals:  ? 02/05/22 0539 02/05/22 0803 02/05/22 0844 02/05/22 0911  ?BP: (!) 172/53 (!) 170/53  (!) 179/55  ?Pulse: 75 76 76 73  ?Resp: $Remov'20 19 20   'ZnPxQf$ ?Temp: 98.6 ?F (37 ?C) 98.7 ?F (37.1 ?C)    ?TempSrc: Oral Oral    ?SpO2: 100% 96% 97%   ?Weight: 62.8 kg     ?Height:      ? ?Weight change: -2.449 kg ? ?Intake/Output Summary (Last 24 hours) at 02/05/2022 0944 ?Last data filed at 02/05/2022 4665 ?Gross per 24 hour  ?Intake 506 ml  ?Output 2950 ml  ?Net -2444 ml  ? ? ?Assessment/Plan: 66 year old WF with COPD, HTN and diastolic heart failure presenting with AKI along with SOB ?1.Renal- normal crt in October-  she also reports very recent labs where her kidney function was normal but unable to confirm.  Now with AKI in the setting of SOB and malignant HTN.  Crt had worsened over the last 72 hours or so-  she was anuric- now non oliguric.  Ultrasound is not very remarkable, nor is duplex.  There is no urine for review.   I suspect that this is hemodynamically mediated.  Initially had a malignant HTN- SBP 214, then lowered to 993 systolic with an ARB on board.  Question of this significance of previously described distal aortic dz-  Dr. Einar Gip did angioplasty on 3/30 with good results.   holding further ARB and trying to keep BP in this range even 140- 160 so we can set the stage for the kidneys to recover.Today crt is down for the first time ?2. Hypertension/volume  - I understand that initial CXR read as pulmonary edema.  high BP at the time exacerbated the issue but now that her BP is better she is better.  She doesn't seem globally overloaded-  no edema -  air movement is poor at baseline but clinically looks better.  Got lasix last on 3/30  with good results-  none since but  is autodiuresing.  BP higher on norvasc 10-- labetalol 100 BID added on today  ?3. Elytes and acidosis-  does have acidosis and K is on the upper limit  of normal-  stable so far-  anticipate will improve as renal function improves ?4. Anemia  - hgb 9.0 not helping her SOB-  supportive care ?5. Nausea-  resolved so did not seem to be due to uremia  ?6. Hyponatremia-  due to inability to regulate kidney wise with ATN- I dont think is hypervolemia hyponatremia-  is better today  ?  ? ? ?Gwendolyn [name]  ? ? ?Labs: ?Basic Metabolic Panel: ?Recent Labs  ?Lab 02/03/22 ?0605 02/04/22 ?0425 02/05/22 ?5701  ?NA 131* 128* 132*  ?K 4.9 4.8 4.5  ?CL 99 97* 102  ?CO2 19* 18* 19*  ?GLUCOSE 107* 100* 100*  ?BUN 54* 69* 72*  ?CREATININE 5.84* 6.77* 4.83*  ?CALCIUM 8.8* 8.5* 8.6*  ? ?Liver Function Tests: ?Recent Labs  ?Lab 02/01/22 ?1520  ?AST 21  ?ALT 17  ?ALKPHOS 71  ?BILITOT 0.6  ?PROT 6.8  ?ALBUMIN 4.2  ? ?No results for input(s): LIPASE, AMYLASE in the last 168 hours. ?No results for input(s): AMMONIA in the last 168 hours. ?CBC: ?Recent Labs  ?Lab 02/01/22 ?1520 02/01/22 ?1830 02/02/22 ?1135 02/03/22 ?7793 02/04/22 ?0425  ?WBC 21.2*  --  11.2* 16.0*  11.8*  ?NEUTROABS 18.0*  --  9.8*  --   --   ?HGB 11.3*   < > 9.1* 9.2* 9.0*  ?HCT 35.4*   < > 28.2* 28.1* 28.4*  ?MCV 82.5  --  80.6 79.4* 80.2  ?PLT 242  --  210 217 204  ? < > = values in this interval not displayed.  ? ?Cardiac Enzymes: ?No results for input(s): CKTOTAL, CKMB, CKMBINDEX, TROPONINI in the last 168 hours. ?CBG: ?Recent Labs  ?Lab 02/05/22 ?1740  ?GLUCAP 108*  ? ? ?Iron Studies: No results for input(s): IRON, TIBC, TRANSFERRIN, FERRITIN in the last 72 hours. ?Studies/Results: ?PERIPHERAL VASCULAR CATHETERIZATION ? ?Result Date: 02/03/2022 ?Abdominal arteriogram and selective left renal arteriogram and lithotripsy balloon angioplasty of the suprarenal abdominal aorta using shockwave M5 catheter 01/24/2022: Abdominal aorta: 2 renal arteries 1 on either sides, mild atherosclerotic changes are present.  Abdominal aorta is diffusely diseased and calcific.  There is a high-grade 90% to 99% stenosis of the  suprarenal abdominal aorta with heavy calcification. Intervention data: Successful shockwave lithotripsy using a 8.0 x 60 mm shockwave M5 balloon, gradual progression of balloon inflation pressure from 4 atmospheric pressure already up to 10 atm pressure and using a 8.43 mm lumen, followed by balloon angioplasty with a 4.0 x 60 mm Mustang balloon at 4 atmospheric pressure. Stenosis reduced from 95% to probably insignificant stenosis (<50%).  Angiogram not performed to conserve contrast and would have been poorly visualized with CO2. Pressure gradient pre and post: Opening right common femoral artery pressure 52/44 mmHg.  Suprarenal thoracic aortic pressure 180/58 mmHg with a mean of 103 mmHg.  Pressure gradient of approximately 130 mmHg.  Closing right femoral artery pressure 121/62 mmHg.  Pressure gradient approximately 30 mmHg post angioplasty. 5-10 mL contrast utilized 20 to visualize abdominal aorta.  Right femoral arterial access closed with Perclose. Recommendation: Start the patient on Plavix, I expect improvement in renal function, do not treat hypertension unless blood pressure >160 mmHg.  We will change all the antihypertensive medications.   ?Medications: ?Infusions: ? sodium chloride    ? sodium chloride    ? sodium chloride    ? ? ?Scheduled Medications: ? amLODipine  10 mg Oral Daily  ? atorvastatin  80 mg Oral QHS  ? clopidogrel  75 mg Oral Q breakfast  ? enoxaparin (LOVENOX) injection  30 mg Subcutaneous Q24H  ? feeding supplement  1 Container Oral TID BM  ? fluticasone furoate-vilanterol  1 puff Inhalation Daily  ? ipratropium-albuterol  3 mL Nebulization TID  ? isosorbide mononitrate  30 mg Oral Daily  ? labetalol  100 mg Oral BID  ? levothyroxine  100 mcg Oral QAC breakfast  ? multivitamin with minerals  1 tablet Oral Daily  ? nicotine  21 mg Transdermal Daily  ? sodium chloride flush  3 mL Intravenous Q12H  ? sodium chloride flush  3 mL Intravenous Q12H  ? sodium chloride flush  3 mL Intravenous  Q12H  ? umeclidinium bromide  1 puff Inhalation Daily  ? ? have reviewed scheduled and prn medications. ? ?Physical Exam: ?General: sitting up -  no inc WOB-  conversive ?Heart: RRR ?Lungs: moving less air today but no rales ?Abdomen: distended ?Extremities: no peripheral edema  ? ? ? ?02/05/2022,9:44 AM ? LOS: 4 days  ? ?  ? ? ? ? ?

## 2022-02-05 NOTE — Progress Notes (Addendum)
Subjective:  ?Doing very well ?No dyspnea ?Urinating well, Cr coming down ? ? ?Objective:  ?Vital Signs in the last 24 hours: ?Temp:  [98.3 ?F (36.8 ?C)-99.8 ?F (37.7 ?C)] 98.7 ?F (37.1 ?C) (04/01 6294) ?Pulse Rate:  [73-76] 73 (04/01 0911) ?Resp:  [17-20] 20 (04/01 0844) ?BP: (151-179)/(47-55) 179/55 (04/01 0911) ?SpO2:  [95 %-100 %] 97 % (04/01 0844) ?Weight:  [62.8 kg] 62.8 kg (04/01 0539) ? ?Intake/Output from previous day: ?03/31 0701 - 04/01 0700 ?In: 626 [P.O.:626] ?Out: 3600 [Urine:3600] ? ?Physical Exam ?Vitals and nursing note reviewed.  ?Constitutional:   ?   General: She is not in acute distress. ?Neck:  ?   Vascular: No JVD.  ?Cardiovascular:  ?   Rate and Rhythm: Normal rate and regular rhythm.  ?   Pulses:     ?     Femoral pulses are 2+ on the right side and 2+ on the left side. ?     Dorsalis pedis pulses are 2+ on the right side and 2+ on the left side.  ?     Posterior tibial pulses are 2+ on the right side.  ?   Heart sounds: Normal heart sounds. No murmur heard. ?Pulmonary:  ?   Effort: Pulmonary effort is normal.  ?   Breath sounds: Normal breath sounds. No wheezing or rales.  ? ? ?Cardiac Studies: ? ?Imaging/tests reviewed and independently interpreted ?Abdominal arteriogram and selective left renal arteriogram and lithotripsy balloon angioplasty of the suprarenal abdominal aorta using shockwave M5 catheter 02/03/2022: ?Abdominal aorta: 2 renal arteries 1 on either sides, mild atherosclerotic changes are present.  Abdominal aorta is diffusely diseased and calcific.  There is a high-grade 90% to 99% stenosis of the suprarenal abdominal aorta with heavy calcification. ?Intervention data: Successful shockwave lithotripsy using a 8.0 x 60 mm shockwave M5 balloon, gradual progression of balloon inflation pressure from 4 atmospheric pressure already up to 10 atm pressure and using a 8.43 mm lumen, followed by balloon angioplasty with a 4.0 x 60 mm Mustang balloon at 4 atmospheric pressure. ?Stenosis  reduced from 95% to probably insignificant stenosis (<50%).  Angiogram not performed to conserve contrast and would have been poorly visualized with CO2. ?  ?Pressure gradient pre and post:  ?Opening right common femoral artery pressure 52/44 mmHg.  Suprarenal thoracic aortic pressure 180/58 mmHg with a mean of 103 mmHg.  Pressure gradient of approximately 130 mmHg.  Closing right femoral artery pressure 121/62 mmHg.  Pressure gradient approximately 30 mmHg post angioplasty. ? ?5-10 mL contrast utilized 20 to visualize abdominal aorta.  Right femoral arterial access closed with Perclose. ? ?Recommendation: Start the patient on Plavix, I expect improvement in renal function, do not treat hypertension unless blood pressure >160 mmHg.  We will change all the antihypertensive medications. ? ?EKG 02/03/2022: ?Normal sinus rhythm ?Minimal voltage criteria for LVH, may be normal variant ( Cornell product ) ?Nonspecific T wave abnormality ?Abnormal ECG ? ?Telemetry 02/05/2022: ?No significant arhythmia ? ?Tests read by me: ?Echocardiogram 02/03/2022: ? 1. Left ventricular ejection fraction, by estimation, is 50 to 55%. The  ?left ventricle has low normal function. The left ventricle has no regional  ?wall motion abnormalities. There is mild left ventricular hypertrophy.  ?Left ventricular diastolic  ?parameters are consistent with Grade II diastolic dysfunction  ?(pseudonormalization).  ? 2. Right ventricular systolic function is normal. The right ventricular  ?size is normal.  ? 3. Left atrial size was severely dilated.  ? 4. The mitral valve is  normal in structure. Mild mitral valve  ?regurgitation. No evidence of mitral stenosis.  ? 5. The aortic valve is tricuspid. Aortic valve regurgitation is moderate.  ? 6. The inferior vena cava is normal in size with <50% respiratory  ?variability, suggesting right atrial pressure of 8 mmHg.  ? ?Comparison(s): A prior study was performed on 09/09/2022. No significant  ?change.   ? ?Assessment & Recommendations: ? ?66 year old Caucasian female with resistant hypertension, mixed hyperlipidemia, nicotine dependence, COPD, known distal aorta stenosis, presentation on 02/03/2022 with acute on chronic HFpEF, hypertensive emergency ? ?Hypertensive emergency: ?Secondary to functional renal artery stenosis in the setting of severe distal aorta stenosis causing renovascular hypertension ?Now s/p successful Abdominal arteriogram and selective left renal arteriogram and lithotripsy balloon angioplasty of the suprarenal abdominal aorta using shockwave M5 catheter 02/03/2022 ?Remarkable results with now down trending creatinine, excellent urine output. This is profound change from near anuria on presentation.  ?Continue Plavix 75 mg, added Aspirin 81 mg. ?Blood pressures still high, can now resume labetalol at low dose ?Continue amlodipine 10 mg. ?Agree with holding ACEi/ARB until further improvement in Creatinine.  ?Could add Bidil, if necessary ? ?HFpEF: ?Secondary to uncontrolled hypertension. ?Management as above ? ? ?Discussed interpretation of tests and management recommendations with the primary team. ? ?Cardiology will sign off. Has f/u duplex studies and f/u w/Dr Einar Gip scheduled.  ? ? ?Nigel Mormon, MD ?Pager: [phone] ?Office: [phone] ? ?

## 2022-02-05 NOTE — Progress Notes (Signed)
Mobility Specialist Progress Note  ? ? 02/05/22 1422  ?Mobility  ?Activity Ambulated with assistance in hallway  ?Level of Assistance Standby assist, set-up cues, supervision of patient - no hands on  ?Assistive Device Front wheel walker  ?Distance Ambulated (ft) 300 ft  ?Activity Response Tolerated well  ?$Mobility charge 1 Mobility  ? ?During Mobility: 104 HR ?Post-Mobility: 79 HR ? ?Pt received in bed and agreeable. No complaints. Returned to room with call bell in reach and family present.  ? ?Hildred Alamin ?Mobility Specialist  ?  ?

## 2022-02-05 NOTE — Progress Notes (Signed)
?PROGRESS NOTE ? ? ? ?Gwendolyn [name]  UVO:[id number] DOB: 1956/01/20 DOA: 02/01/2022 ?PCP: Jilda Panda, MD  ?Narrative: 65/F with history of COPD, tobacco abuse, resistant hypertension, high-grade distal aortic stenosis, chronic diastolic CHF presented to the ED 3/28 with worsening dyspnea.  Recently Lasix dose was increased to 40 Mg daily. ?-In the ED she was acutely dyspneic, hypoxic, chest x-ray noted pulmonary vascular congestion and interstitial edema, labs noted BNP of 1788, creatinine of 1.9 up from baseline of 0.7 ?- w/ SEVERE OLIGURIC AKI  ?-3/30 underwent angioplasty of suprarenal abdominal aortic stenosis ? ?Subjective: ?-Overnight had dyspnea, required BiPAP ? ?Assessment and Plan: ? ?Acute kidney injury ?-Baseline creatinine around 0.7,->1.9> 3.1>5.8>6.7 ?-This was felt to be hemodynamically mediated, secondary to high-grade suprarenal abdominal aortic stenosis>95%, ongoing ARB use ?-Improving, urine output of 3600 mL yesterday, creatinine trending down 4.8 today ?-Anticipate renal recovery, avoid hypotension ?-Nephrology following, BMP in a.m. ? ?Suprarenal abdominal aortic stenosis ?-High-grade, 90-99%, treated with shockwave lithotripsy, balloon angioplasty stenosis reduced from 95% to<50% per Dr.Ganji 3/30 ?-Continue Plavix ?-Avoid hypotension ? ?Acute hypoxic respiratory failure ?Acute diastolic CHF ?-Recent echo 11/22 noted EF of 55-60% ?-Improving, see discussion above ? ?Uncontrolled hypertension ?-Improving, continue Imdur, amlodipine, labetalol-dose increased to 100 Mg twice daily ?-Avoid hypotension, goal BP 140-160 ? ?COPD ?Tobacco abuse ?-Clinically do not suspect COPD exacerbation at this time, will discontinue Levaquin and monitor ?-Continue ICS/LABA, DuoNebs ?-Counseled ? ?Hypothyroidism ?-Continue Synthroid ? ?DVT prophylaxis: Lovenox ?Code Status: Full code ?Family Communication: Discussed with patient in detail, daughter at bedside ?Disposition Plan: To be  determined ? ?Consultants:  ?Nephrology, cardiology ? ?Procedures: ? ?Dr.Ganji: Abdominal arteriogram and selective left renal arteriogram and lithotripsy balloon angioplasty of the suprarenal abdominal aorta using shockwave M5 catheter 01/24/2022: ?Abdominal aorta: 2 renal arteries 1 on either sides, mild atherosclerotic changes are present.  Abdominal aorta is diffusely diseased and calcific.  There is a high-grade 90% to 99% stenosis of the suprarenal abdominal aorta with heavy calcification. ?Intervention data: Successful shockwave lithotripsy using a 8.0 x 60 mm shockwave M5 balloon, gradual progression of balloon inflation pressure from 4 atmospheric pressure already up to 10 atm pressure and using a 8.43 mm lumen, followed by balloon angioplasty with a 4.0 x 60 mm Mustang balloon at 4 atmospheric pressure. ?Stenosis reduced from 95% to probably insignificant stenosis (<50%).  Angiogram not performed to conserve contrast and would have been poorly visualized with CO2. ? ?Antimicrobials:  ? ? ?Objective: ?Vitals:  ? 02/05/22 0539 02/05/22 0803 02/05/22 7425 02/05/22 0911  ?BP: (!) 172/53 (!) 170/53  (!) 179/55  ?Pulse: 75 76 76 73  ?Resp: $Remov'20 19 20   'tCSkxb$ ?Temp: 98.6 ?F (37 ?C) 98.7 ?F (37.1 ?C)    ?TempSrc: Oral Oral    ?SpO2: 100% 96% 97%   ?Weight: 62.8 kg     ?Height:      ? ? ?Intake/Output Summary (Last 24 hours) at 02/05/2022 1152 ?Last data filed at 02/05/2022 9563 ?Gross per 24 hour  ?Intake 506 ml  ?Output 2150 ml  ?Net -1644 ml  ? ?Filed Weights  ? 02/04/22 0000 02/04/22 0324 02/05/22 0539  ?Weight: 65.2 kg 65.2 kg 62.8 kg  ? ? ?Examination: ? ?General exam: Pleasant female sitting up in bed, AAOx3, no distress ?HEENT: No JVD ?CVS: S1-S2, regular rate rhythm ?Lungs: Poor air movement bilaterally otherwise clear ?Abdomen: Soft, nontender, bowel sounds present ?Extremities: No edema ?Skin: No rashes ?Psychiatry: Judgement and insight appear normal. Mood & affect appropriate.  ? ? ? ?  Data Reviewed:   ? ?CBC: ?Recent Labs  ?Lab 02/01/22 ?1520 02/01/22 ?1830 02/02/22 ?1135 02/03/22 ?4401 02/04/22 ?0425  ?WBC 21.2*  --  11.2* 16.0* 11.8*  ?NEUTROABS 18.0*  --  9.8*  --   --   ?HGB 11.3* 10.9* 9.1* 9.2* 9.0*  ?HCT 35.4* 32.0* 28.2* 28.1* 28.4*  ?MCV 82.5  --  80.6 79.4* 80.2  ?PLT 242  --  210 217 204  ? ?Basic Metabolic Panel: ?Recent Labs  ?Lab 02/01/22 ?1520 02/01/22 ?1830 02/02/22 ?0435 02/03/22 ?0605 02/04/22 ?0425 02/05/22 ?0272  ?NA 135 137 133* 131* 128* 132*  ?K 4.4 4.6 4.9 4.9 4.8 4.5  ?CL 105  --  105 99 97* 102  ?CO2 21*  --  17* 19* 18* 19*  ?GLUCOSE 159*  --  131* 107* 100* 100*  ?BUN 25*  --  33* 54* 69* 72*  ?CREATININE 1.99*  --  3.14* 5.84* 6.77* 4.83*  ?CALCIUM 9.2  --  8.9 8.8* 8.5* 8.6*  ? ?GFR: ?Estimated Creatinine Clearance: 9.4 mL/min (A) (by C-G formula based on SCr of 4.83 mg/dL (H)). ?Liver Function Tests: ?Recent Labs  ?Lab 02/01/22 ?1520  ?AST 21  ?ALT 17  ?ALKPHOS 71  ?BILITOT 0.6  ?PROT 6.8  ?ALBUMIN 4.2  ? ?No results for input(s): LIPASE, AMYLASE in the last 168 hours. ?No results for input(s): AMMONIA in the last 168 hours. ?Coagulation Profile: ?No results for input(s): INR, PROTIME in the last 168 hours. ?Cardiac Enzymes: ?No results for input(s): CKTOTAL, CKMB, CKMBINDEX, TROPONINI in the last 168 hours. ?BNP (last 3 results) ?No results for input(s): PROBNP in the last 8760 hours. ?HbA1C: ?No results for input(s): HGBA1C in the last 72 hours. ?CBG: ?Recent Labs  ?Lab 02/05/22 ?5366  ?GLUCAP 108*  ? ?Lipid Profile: ?Recent Labs  ?  02/04/22 ?0425  ?CHOL 126  ?HDL 27*  ?[place] 70  ?TRIG 147  ?CHOLHDL 4.7  ? ?Thyroid Function Tests: ?No results for input(s): TSH, T4TOTAL, FREET4, T3FREE, THYROIDAB in the last 72 hours. ? ?Anemia Panel: ?No results for input(s): VITAMINB12, FOLATE, FERRITIN, TIBC, IRON, RETICCTPCT in the last 72 hours. ?Urine analysis: ?   ?Component Value Date/Time  ? COLORURINE YELLOW 08/27/2021 1204  ? APPEARANCEUR CLEAR 08/27/2021 1204  ? LABSPEC >1.046 (H)  08/27/2021 1204  ? PHURINE 6.0 08/27/2021 1204  ? GLUCOSEU NEGATIVE 08/27/2021 1204  ? HGBUR NEGATIVE 08/27/2021 1204  ? [place] NEGATIVE 08/27/2021 1204  ? [place] NEGATIVE 08/27/2021 1204  ? PROTEINUR NEGATIVE 08/27/2021 1204  ? NITRITE NEGATIVE 08/27/2021 1204  ? LEUKOCYTESUR SMALL (A) 08/27/2021 1204  ? ?Sepsis Labs: ?$RemoveBefore'@LABRCNTIP'[id number]$ (procalcitonin:4,lacticidven:4) ? ?) ?Recent Results (from the past 240 hour(s))  ?Resp Panel by RT-PCR (Flu A&B, Covid) Nasopharyngeal Swab     Status: None  ? Collection Time: 02/01/22  3:21 PM  ? Specimen: Nasopharyngeal Swab; Nasopharyngeal(NP) swabs in vial transport medium  ?Result Value Ref Range Status  ? SARS Coronavirus 2 by RT PCR NEGATIVE NEGATIVE Final  ?  Comment: (NOTE) ?SARS-CoV-2 target nucleic acids are NOT DETECTED. ? ?The SARS-CoV-2 RNA is generally detectable in upper respiratory ?specimens during the acute phase of infection. The lowest ?concentration of SARS-CoV-2 viral copies this assay can detect is ?138 copies/mL. A negative result does not preclude SARS-Cov-2 ?infection and should not be used as the sole basis for treatment or ?other patient management decisions. A negative result may occur with  ?improper specimen collection/handling, submission of specimen other ?than nasopharyngeal swab, presence of viral mutation(s) within the ?areas  targeted by this assay, and inadequate number of viral ?copies(<138 copies/mL). A negative result must be combined with ?clinical observations, patient history, and epidemiological ?information. The expected result is Negative. ? ?Fact Sheet for Patients:  ?EntrepreneurPulse.com.au ? ?Fact Sheet for Healthcare Providers:  ?IncredibleEmployment.be ? ?This test is no t yet approved or cleared by the [place] FDA and  ?has been authorized for detection and/or diagnosis of SARS-CoV-2 by ?FDA under an Emergency Use Authorization (EUA). This EUA will remain  ?in effect (meaning this test  can be used) for the duration of the ?COVID-19 declaration under Section 564(b)(1) of the Act, 21 ?U.S.C.section 360bbb-3(b)(1), unless the authorization is terminated  ?or revoked sooner.  ? ? ?  ? Influenza A by PCR NEGATIVE NEGATIVE Final

## 2022-02-06 DIAGNOSIS — N179 Acute kidney failure, unspecified: Secondary | ICD-10-CM | POA: Diagnosis not present

## 2022-02-06 DIAGNOSIS — R0603 Acute respiratory distress: Secondary | ICD-10-CM | POA: Diagnosis not present

## 2022-02-06 DIAGNOSIS — Q251 Coarctation of aorta: Secondary | ICD-10-CM | POA: Diagnosis not present

## 2022-02-06 DIAGNOSIS — I5033 Acute on chronic diastolic (congestive) heart failure: Secondary | ICD-10-CM | POA: Diagnosis not present

## 2022-02-06 LAB — BASIC METABOLIC PANEL
Anion gap: 7 (ref 5–15)
BUN: 50 mg/dL — ABNORMAL HIGH (ref 8–23)
CO2: 23 mmol/L (ref 22–32)
Calcium: 8.7 mg/dL — ABNORMAL LOW (ref 8.9–10.3)
Chloride: 104 mmol/L (ref 98–111)
Creatinine, Ser: 2.01 mg/dL — ABNORMAL HIGH (ref 0.44–1.00)
GFR, Estimated: 27 mL/min — ABNORMAL LOW (ref >60)
Glucose, Bld: 99 mg/dL (ref 70–99)
Potassium: 4.5 mmol/L (ref 3.5–5.1)
Sodium: 134 mmol/L — ABNORMAL LOW (ref 135–145)

## 2022-02-06 LAB — CBC
HCT: 30.5 % — ABNORMAL LOW (ref 36.0–46.0)
Hemoglobin: 9.9 g/dL — ABNORMAL LOW (ref 12.0–15.0)
MCH: 25.6 pg — ABNORMAL LOW (ref 26.0–34.0)
MCHC: 32.5 g/dL (ref 30.0–36.0)
MCV: 79 fL — ABNORMAL LOW (ref 80.0–100.0)
Platelets: 204 10*3/uL (ref 150–400)
RBC: 3.86 MIL/uL — ABNORMAL LOW (ref 3.87–5.11)
RDW: 16.9 % — ABNORMAL HIGH (ref 11.5–15.5)
WBC: 11.1 10*3/uL — ABNORMAL HIGH (ref 4.0–10.5)
nRBC: 0 % (ref 0.0–0.2)

## 2022-02-06 MED ORDER — IPRATROPIUM-ALBUTEROL 0.5-2.5 (3) MG/3ML IN SOLN: 3.0000 mL | Freq: Four times a day (QID) | RESPIRATORY_TRACT | Status: DC | PRN

## 2022-02-06 NOTE — Progress Notes (Signed)
Subjective:  UOP 1200-  crt down now to 2-   Looks great again  ? ?Objective ?Vital signs in last 24 hours: ?Vitals:  ? 02/06/22 0500 02/06/22 0520 02/06/22 0709 02/06/22 0802  ?BP:  (!) 169/61 (!) 168/56   ?Pulse:  71 70   ?Resp:  19 20   ?Temp:  98.8 ?F (37.1 ?C) 98.5 ?F (36.9 ?C)   ?TempSrc:  Oral Oral   ?SpO2:  94% 94% 95%  ?Weight: 61.8 kg     ?Height:      ? ?Weight change: -0.953 kg ? ?Intake/Output Summary (Last 24 hours) at 02/06/2022 0913 ?Last data filed at 02/06/2022 0908 ?Gross per 24 hour  ?Intake 660 ml  ?Output 1200 ml  ?Net -540 ml  ? ? ?Assessment/Plan: 66 year old WF with COPD, HTN and diastolic heart failure presenting with AKI along with SOB ?1.Renal- normal crt in October-  she also reports recent labs where kidney function was normal but unable to confirm.  Now with AKI in the setting of SOB and malignant HTN.  Crt had worsened -  she was anuric.  Ultrasound is not very remarkable, nor is duplex.   no urine for review.   I suspected that this was hemodynamically mediated.  Initially had a malignant HTN- SBP 214, then lowered to 099 systolic with an ARB on board.  Question of this significance of previously described distal aortic dz-  Dr. Einar Gip did angioplasty on 3/30 with good results.   holding further ARB and trying to keep BP in this range even 140- 160 so we can set the stage for the kidneys to recover.Today crt is down for the second day in a row ?2. Hypertension/volume  - initial CXR read as pulmonary edema.  high BP at the time exacerbated the issue but once BP was better she was better.  She doesn't seem globally overloaded-  no edema -  air movement is poor at baseline.  Got lasix last on 3/30  with good results-  none since but  is autodiuresing.  BP higher on norvasc 10-- labetalol 100 BID added on yesterday-  could add back ARB at some point but not til renal function is back to normal ?3. Elytes and acidosis-  had acidosis and K on the upper limit of normal-  stable -  anticipate  will improve as renal function improves- it has ?4. Anemia  - hgb 9.0 not helping her SOB-  supportive care ?5. Nausea-  resolved so did not seem to be due to uremia  ?6. Hyponatremia-  due to inability to regulate kidney wise with ATN- I dont think is hypervolemia hyponatremia-  is better  ? ?Anticipate that renal function will continue to improve back to normal-  I have nothing further to add.  Would send her out to follow up with PCP-  if renal function improves to normal will not need renal specific follow up-  would resume ARB once that happens though.  Renal will sign off-  call with questions  ?  ? ? ?Louis Meckel  ? ? ?Labs: ?Basic Metabolic Panel: ?Recent Labs  ?Lab 02/04/22 ?0425 02/05/22 ?8338 02/06/22 ?0448  ?NA 128* 132* 134*  ?K 4.8 4.5 4.5  ?CL 97* 102 104  ?CO2 18* 19* 23  ?GLUCOSE 100* 100* 99  ?BUN 69* 72* 50*  ?CREATININE 6.77* 4.83* 2.01*  ?CALCIUM 8.5* 8.6* 8.7*  ? ?Liver Function Tests: ?Recent Labs  ?Lab 02/01/22 ?1520  ?AST 21  ?ALT  17  ?ALKPHOS 71  ?BILITOT 0.6  ?PROT 6.8  ?ALBUMIN 4.2  ? ?No results for input(s): LIPASE, AMYLASE in the last 168 hours. ?No results for input(s): AMMONIA in the last 168 hours. ?CBC: ?Recent Labs  ?Lab 02/01/22 ?1520 02/01/22 ?1830 02/02/22 ?1135 02/03/22 ?9480 02/04/22 ?0425 02/06/22 ?0448  ?WBC 21.2*  --  11.2* 16.0* 11.8* 11.1*  ?NEUTROABS 18.0*  --  9.8*  --   --   --   ?HGB 11.3*   < > 9.1* 9.2* 9.0* 9.9*  ?HCT 35.4*   < > 28.2* 28.1* 28.4* 30.5*  ?MCV 82.5  --  80.6 79.4* 80.2 79.0*  ?PLT 242  --  210 217 204 204  ? < > = values in this interval not displayed.  ? ?Cardiac Enzymes: ?No results for input(s): CKTOTAL, CKMB, CKMBINDEX, TROPONINI in the last 168 hours. ?CBG: ?Recent Labs  ?Lab 02/05/22 ?1655  ?GLUCAP 108*  ? ? ?Iron Studies: No results for input(s): IRON, TIBC, TRANSFERRIN, FERRITIN in the last 72 hours. ?Studies/Results: ?PCV ECHOCARDIOGRAM COMPLETE ? ?Result Date: 02/05/2022 ?Echocardiogram 02/01/2022: Mildly depressed LV systolic  function with visual EF 40-45%. Left ventricle cavity is normal in size. Moderate concentric remodeling of the left ventricle. Hypokinetic global wall motion. Calculated EF 42%. Trileaflet aortic valve.  Mild (Grade I) aortic regurgitation. Mild aortic valve leaflet calcification. Limited echo with patient discomfort and inability to lay down due to marked dyspnea. Only parasternal views.   ?Medications: ?Infusions: ? sodium chloride    ? sodium chloride    ? sodium chloride    ? ? ?Scheduled Medications: ? amLODipine  10 mg Oral Daily  ? aspirin EC  81 mg Oral Daily  ? atorvastatin  80 mg Oral QHS  ? clopidogrel  75 mg Oral Q breakfast  ? enoxaparin (LOVENOX) injection  30 mg Subcutaneous Q24H  ? feeding supplement  1 Container Oral TID BM  ? fluticasone furoate-vilanterol  1 puff Inhalation Daily  ? ipratropium-albuterol  3 mL Nebulization TID  ? isosorbide mononitrate  30 mg Oral Daily  ? labetalol  100 mg Oral BID  ? levothyroxine  100 mcg Oral QAC breakfast  ? multivitamin with minerals  1 tablet Oral Daily  ? nicotine  21 mg Transdermal Daily  ? sodium chloride flush  3 mL Intravenous Q12H  ? sodium chloride flush  3 mL Intravenous Q12H  ? sodium chloride flush  3 mL Intravenous Q12H  ? umeclidinium bromide  1 puff Inhalation Daily  ? ? have reviewed scheduled and prn medications. ? ?Physical Exam: ?General: sitting up -  no inc WOB-  conversive ?Heart: RRR ?Lungs: moving less air today but no rales ?Abdomen: distended ?Extremities: no peripheral edema  ? ? ? ?02/06/2022,9:13 AM ? LOS: 5 days  ? ?  ? ? ? ? ?

## 2022-02-06 NOTE — Progress Notes (Signed)
?PROGRESS NOTE ? ? ? ?Gwendolyn [name]  NPY:[id number] DOB: [birth year] DOA: 02/01/2022 ?PCP: Jilda Panda, MD  ?Narrative: 65/F with history of COPD, tobacco abuse, resistant hypertension, high-grade distal aortic stenosis, chronic diastolic CHF presented to the ED 3/28 with worsening dyspnea.  Recently Lasix dose was increased to 40 Mg daily. ?-In the ED she was acutely dyspneic, hypoxic, chest x-ray noted pulmonary vascular congestion and interstitial edema, labs noted BNP of 1788, creatinine of 1.9 up from baseline of 0.7 ?- w/ SEVERE OLIGURIC AKI  ?-3/30 underwent angioplasty of suprarenal abdominal aortic stenosis ?-Kidney function continues to improve ? ?Subjective: ?-Feels much better overall, breathing is back to normal, wondering when she can go home ? ?Assessment and Plan: ? ?Acute kidney injury ?-Baseline creatinine around 0.7,->1.9> 3.1>5.8>6.7 ?-This was felt to be hemodynamically mediated, relative hypotension(214>140) and high-grade suprarenal abdominal aortic stenosis>95%, ongoing ARB use ?-Improving, good urine output, creatinine down to 2.0 today ?-Anticipate continued renal recovery, avoid hypotension ?-Discharge planning, home tomorrow ? ?Suprarenal abdominal aortic stenosis ?-High-grade, 90-99%, treated with shockwave lithotripsy, balloon angioplasty stenosis reduced from 95% to<50% per Dr.Ganji 3/30 ?-Continue Plavix ?-Avoid hypotension ? ?Acute hypoxic respiratory failure ?Acute diastolic CHF ?-Recent echo 11/22 noted EF of 55-60% ?-Improving, see discussion above ?-Does not need diuretics ? ?Uncontrolled hypertension ?-Improving, continue Imdur, amlodipine, labetalol-dose increased to 100 Mg twice daily ?-Avoid hypotension, goal BP 140-160 ?-Increase Imdur dose tomorrow if blood pressure remains elevated, hold off on ARB ? ?COPD ?Tobacco abuse ?-Clinically do not suspect COPD exacerbation at this time, will discontinue Levaquin and monitor ?-Continue ICS/LABA,  DuoNebs ?-Counseled ? ?Hypothyroidism ?-Continue Synthroid ? ?DVT prophylaxis: Lovenox ?Code Status: Full code ?Family Communication: Discussed with patient in detail, daughter at bedside yesterday ?Disposition Plan: Home tomorrow ? ?Consultants:  ?Nephrology, cardiology ? ?Procedures: ? ?Dr.Ganji: Abdominal arteriogram and selective left renal arteriogram and lithotripsy balloon angioplasty of the suprarenal abdominal aorta using shockwave M5 catheter 01/24/2022: ?Abdominal aorta: 2 renal arteries 1 on either sides, mild atherosclerotic changes are present.  Abdominal aorta is diffusely diseased and calcific.  There is a high-grade 90% to 99% stenosis of the suprarenal abdominal aorta with heavy calcification. ?Intervention data: Successful shockwave lithotripsy using a 8.0 x 60 mm shockwave M5 balloon, gradual progression of balloon inflation pressure from 4 atmospheric pressure already up to 10 atm pressure and using a 8.43 mm lumen, followed by balloon angioplasty with a 4.0 x 60 mm Mustang balloon at 4 atmospheric pressure. ?Stenosis reduced from 95% to probably insignificant stenosis (<50%).  Angiogram not performed to conserve contrast and would have been poorly visualized with CO2. ? ?Antimicrobials:  ? ? ?Objective: ?Vitals:  ? 02/06/22 0520 02/06/22 0709 02/06/22 0802 02/06/22 1107  ?BP: (!) 169/61 (!) 168/56  (!) 146/50  ?Pulse: 71 70  66  ?Resp: 19 20    ?Temp: 98.8 ?F (37.1 ?C) 98.5 ?F (36.9 ?C)  98.2 ?F (36.8 ?C)  ?TempSrc: Oral Oral  Oral  ?SpO2: 94% 94% 95% 95%  ?Weight:      ?Height:      ? ? ?Intake/Output Summary (Last 24 hours) at 02/06/2022 1159 ?Last data filed at 02/06/2022 0908 ?Gross per 24 hour  ?Intake 660 ml  ?Output 1050 ml  ?Net -390 ml  ? ?Filed Weights  ? 02/04/22 0324 02/05/22 0539 02/06/22 0500  ?Weight: 65.2 kg 62.8 kg 61.8 kg  ? ? ?Examination: ? ?Gen: Awake, Alert, Oriented X 3, chronically ill female ?HEENT: no JVD ?Lungs: Poor air movement otherwise clear ?CVS: S1S2/RRR ?  Abd: soft,  Non tender, non distended, BS present ?Extremities: No edema ?Skin: no new rashes on exposed skin  ? ? ? ?Data Reviewed:  ? ?CBC: ?Recent Labs  ?Lab 02/01/22 ?1520 02/01/22 ?1830 02/02/22 ?1135 02/03/22 ?8366 02/04/22 ?0425 02/06/22 ?0448  ?WBC 21.2*  --  11.2* 16.0* 11.8* 11.1*  ?NEUTROABS 18.0*  --  9.8*  --   --   --   ?HGB 11.3* 10.9* 9.1* 9.2* 9.0* 9.9*  ?HCT 35.4* 32.0* 28.2* 28.1* 28.4* 30.5*  ?MCV 82.5  --  80.6 79.4* 80.2 79.0*  ?PLT 242  --  210 217 204 204  ? ?Basic Metabolic Panel: ?Recent Labs  ?Lab 02/02/22 ?0435 02/03/22 ?0605 02/04/22 ?0425 02/05/22 ?2947 02/06/22 ?0448  ?NA 133* 131* 128* 132* 134*  ?K 4.9 4.9 4.8 4.5 4.5  ?CL 105 99 97* 102 104  ?CO2 17* 19* 18* 19* 23  ?GLUCOSE 131* 107* 100* 100* 99  ?BUN 33* 54* 69* 72* 50*  ?CREATININE 3.14* 5.84* 6.77* 4.83* 2.01*  ?CALCIUM 8.9 8.8* 8.5* 8.6* 8.7*  ? ?GFR: ?Estimated Creatinine Clearance: 22.3 mL/min (A) (by C-G formula based on SCr of 2.01 mg/dL (H)). ?Liver Function Tests: ?Recent Labs  ?Lab 02/01/22 ?1520  ?AST 21  ?ALT 17  ?ALKPHOS 71  ?BILITOT 0.6  ?PROT 6.8  ?ALBUMIN 4.2  ? ?No results for input(s): LIPASE, AMYLASE in the last 168 hours. ?No results for input(s): AMMONIA in the last 168 hours. ?Coagulation Profile: ?No results for input(s): INR, PROTIME in the last 168 hours. ?Cardiac Enzymes: ?No results for input(s): CKTOTAL, CKMB, CKMBINDEX, TROPONINI in the last 168 hours. ?BNP (last 3 results) ?No results for input(s): PROBNP in the last 8760 hours. ?HbA1C: ?No results for input(s): HGBA1C in the last 72 hours. ?CBG: ?Recent Labs  ?Lab 02/05/22 ?6546  ?GLUCAP 108*  ? ?Lipid Profile: ?Recent Labs  ?  02/04/22 ?0425  ?CHOL 126  ?HDL 27*  ?[place] 70  ?TRIG 147  ?CHOLHDL 4.7  ? ?Thyroid Function Tests: ?No results for input(s): TSH, T4TOTAL, FREET4, T3FREE, THYROIDAB in the last 72 hours. ? ?Anemia Panel: ?No results for input(s): VITAMINB12, FOLATE, FERRITIN, TIBC, IRON, RETICCTPCT in the last 72 hours. ?Urine analysis: ?   ?Component  Value Date/Time  ? COLORURINE YELLOW 08/27/2021 1204  ? APPEARANCEUR CLEAR 08/27/2021 1204  ? LABSPEC >1.046 (H) 08/27/2021 1204  ? PHURINE 6.0 08/27/2021 1204  ? GLUCOSEU NEGATIVE 08/27/2021 1204  ? HGBUR NEGATIVE 08/27/2021 1204  ? [place] NEGATIVE 08/27/2021 1204  ? [place] NEGATIVE 08/27/2021 1204  ? PROTEINUR NEGATIVE 08/27/2021 1204  ? NITRITE NEGATIVE 08/27/2021 1204  ? LEUKOCYTESUR SMALL (A) 08/27/2021 1204  ? ?Sepsis Labs: ?$RemoveBefore'@LABRCNTIP'[id number]$ (procalcitonin:4,lacticidven:4) ? ?) ?Recent Results (from the past 240 hour(s))  ?Resp Panel by RT-PCR (Flu A&B, Covid) Nasopharyngeal Swab     Status: None  ? Collection Time: 02/01/22  3:21 PM  ? Specimen: Nasopharyngeal Swab; Nasopharyngeal(NP) swabs in vial transport medium  ?Result Value Ref Range Status  ? SARS Coronavirus 2 by RT PCR NEGATIVE NEGATIVE Final  ?  Comment: (NOTE) ?SARS-CoV-2 target nucleic acids are NOT DETECTED. ? ?The SARS-CoV-2 RNA is generally detectable in upper respiratory ?specimens during the acute phase of infection. The lowest ?concentration of SARS-CoV-2 viral copies this assay can detect is ?138 copies/mL. A negative result does not preclude SARS-Cov-2 ?infection and should not be used as the sole basis for treatment or ?other patient management decisions. A negative result may occur with  ?improper specimen collection/handling, submission of specimen other ?  than nasopharyngeal swab, presence of viral mutation(s) within the ?areas targeted by this assay, and inadequate number of viral ?copies(<138 copies/mL). A negative result must be combined with ?clinical observations, patient history, and epidemiological ?information. The expected result is Negative. ? ?Fact Sheet for Patients:  ?EntrepreneurPulse.com.au ? ?Fact Sheet for Healthcare Providers:  ?IncredibleEmployment.be ? ?This test is no t yet approved or cleared by the [place] FDA and  ?has been authorized for detection and/or diagnosis of  SARS-CoV-2 by ?FDA under an Emergency Use Authorization (EUA). This EUA will remain  ?in effect (meaning this test can be used) for the duration of the ?COVID-19 declaration under Section 564(b)(1) of the Act, 21 ?U.S.C.section 360bb

## 2022-02-06 NOTE — Progress Notes (Signed)
Pt in no resp distress at this time requiring bipap.  No device in room.  RT will cont to monitor. ?

## 2022-02-06 NOTE — Progress Notes (Signed)
Mobility Specialist Progress Note  ? ? 02/06/22 1512  ?Mobility  ?Activity Ambulated independently in hallway  ?Level of Assistance Standby assist, set-up cues, supervision of patient - no hands on  ?Assistive Device None  ?Distance Ambulated (ft) 350 ft  ?Activity Response Tolerated well  ?$Mobility charge 1 Mobility  ? ?Pt received in bed and agreeable. No complaints on walk. Returned to bed with call bell in reach.   ? ?Gwendolyn [name] ?Mobility Specialist  ?  ?

## 2022-02-07 ENCOUNTER — Other Ambulatory Visit (HOSPITAL_COMMUNITY): Payer: Self-pay

## 2022-02-07 ENCOUNTER — Inpatient Hospital Stay (HOSPITAL_COMMUNITY): Payer: Medicare Other

## 2022-02-07 DIAGNOSIS — I739 Peripheral vascular disease, unspecified: Secondary | ICD-10-CM

## 2022-02-07 DIAGNOSIS — N179 Acute kidney failure, unspecified: Secondary | ICD-10-CM | POA: Diagnosis not present

## 2022-02-07 DIAGNOSIS — I5033 Acute on chronic diastolic (congestive) heart failure: Secondary | ICD-10-CM | POA: Diagnosis not present

## 2022-02-07 LAB — URINALYSIS, ROUTINE W REFLEX MICROSCOPIC
Bilirubin Urine: NEGATIVE
Glucose, UA: NEGATIVE mg/dL
Hgb urine dipstick: NEGATIVE
Ketones, ur: NEGATIVE mg/dL
Leukocytes,Ua: NEGATIVE
Nitrite: NEGATIVE
Protein, ur: NEGATIVE mg/dL
Specific Gravity, Urine: 1.012 (ref 1.005–1.030)
pH: 5 (ref 5.0–8.0)

## 2022-02-07 LAB — RENAL FUNCTION PANEL
Albumin: 3.4 g/dL — ABNORMAL LOW (ref 3.5–5.0)
Anion gap: 7 (ref 5–15)
BUN: 36 mg/dL — ABNORMAL HIGH (ref 8–23)
CO2: 24 mmol/L (ref 22–32)
Calcium: 8.5 mg/dL — ABNORMAL LOW (ref 8.9–10.3)
Chloride: 103 mmol/L (ref 98–111)
Creatinine, Ser: 1.26 mg/dL — ABNORMAL HIGH (ref 0.44–1.00)
GFR, Estimated: 47 mL/min — ABNORMAL LOW (ref >60)
Glucose, Bld: 110 mg/dL — ABNORMAL HIGH (ref 70–99)
Phosphorus: 2.5 mg/dL (ref 2.5–4.6)
Potassium: 4.4 mmol/L (ref 3.5–5.1)
Sodium: 134 mmol/L — ABNORMAL LOW (ref 135–145)

## 2022-02-07 LAB — PROTEIN / CREATININE RATIO, URINE
Creatinine, Urine: 71.58 mg/dL
Protein Creatinine Ratio: 0.14 mg/mg{Cre} (ref 0.00–0.15)
Total Protein, Urine: 10 mg/dL

## 2022-02-07 MED ORDER — ISOSORBIDE DINITRATE 10 MG PO TABS: 30.0000 mg | ORAL_TABLET | Freq: Three times a day (TID) | ORAL | Status: DC

## 2022-02-07 MED ORDER — LABETALOL HCL 200 MG PO TABS
200.0000 mg | ORAL_TABLET | Freq: Two times a day (BID) | ORAL | Status: DC
Start: 2022-02-07 — End: 2022-10-21

## 2022-02-07 MED ORDER — FUROSEMIDE 40 MG PO TABS: 20.0000 mg | ORAL_TABLET | Freq: Every day | ORAL | 0 refills | Status: DC

## 2022-02-07 MED ORDER — HYDRALAZINE HCL 50 MG PO TABS: 50.0000 mg | ORAL_TABLET | Freq: Three times a day (TID) | ORAL | Status: DC

## 2022-02-07 MED ORDER — CLOPIDOGREL BISULFATE 75 MG PO TABS
75.0000 mg | ORAL_TABLET | Freq: Every day | ORAL | 0 refills | Status: DC
  Filled 2022-02-07: qty 30, 30d supply, fill #0

## 2022-02-07 MED ORDER — ISOSORBIDE MONONITRATE ER 30 MG PO TB24
45.0000 mg | ORAL_TABLET | Freq: Every day | ORAL | Status: DC
  Administered 2022-02-07: 45 mg via ORAL
  Filled 2022-02-07: qty 2

## 2022-02-07 MED ORDER — ISOSORBIDE DINITRATE 30 MG PO TABS
30.0000 mg | ORAL_TABLET | Freq: Three times a day (TID) | ORAL | 0 refills | Status: DC
  Filled 2022-02-07: qty 90, 30d supply, fill #0

## 2022-02-07 MED ORDER — ISOSORBIDE MONONITRATE ER 30 MG PO TB24
30.0000 mg | ORAL_TABLET | Freq: Every day | ORAL | 0 refills | Status: DC
  Filled 2022-02-07: qty 30, 30d supply, fill #0

## 2022-02-07 MED ORDER — ASPIRIN 81 MG PO TBEC: 81.0000 mg | DELAYED_RELEASE_TABLET | Freq: Every day | ORAL | 11 refills | Status: DC

## 2022-02-07 MED ORDER — LABETALOL HCL 200 MG PO TABS
200.0000 mg | ORAL_TABLET | Freq: Two times a day (BID) | ORAL | Status: DC
Start: 2022-02-07 — End: 2022-02-07
  Administered 2022-02-07: 200 mg via ORAL
  Filled 2022-02-07: qty 1

## 2022-02-07 NOTE — Progress Notes (Signed)
Physical Therapy Treatment ?Patient Details ?Name: Gwendolyn [name] ?MRN: [id number] ?DOB: 05-10-1956 ?Today's Date: 02/07/2022 ? ? ?History of Present Illness The pt is a 66 yo female presenting 3/28 with SOB with hypoxia on RA (SpO2 82%). Pt found to have CHF exacerbation and AKI. PMH includes: HTN, HLD, current tobacco use, severe aortic stenosis, COPD, HFpEF, and asthma. ? ?  ?PT Comments  ? ? The pt was able to demo great progress with mobility, ambulating independently in the room without any evidence of instability, and completing both hallway ambulation and stairs without need for support or any assist. The pt's SpO2 remained >96% with all activity on RA. Pt is safe for d/c home without follow up therapies. All questions answered and pt expressed good understanding of all exercise and mobility recommendations.  ? ?Gait Speed: 0.72ms (Gait speed < 1.06m indicates increased risk of falls) ?   ?Recommendations for follow up therapy are one component of a multi-disciplinary discharge planning process, led by the attending physician.  Recommendations may be updated based on patient status, additional functional criteria and insurance authorization. ? ?Follow Up Recommendations ? No PT follow up ?  ?  ?Assistance Recommended at Discharge PRN  ?Patient can return home with the following Help with stairs or ramp for entrance ?  ?Equipment Recommendations ? None recommended by PT  ?  ?Recommendations for Other Services   ? ? ?  ?Precautions / Restrictions Precautions ?Precautions: Fall ?Precaution Comments: on RA through session, watch SpO2 ?Restrictions ?Weight Bearing Restrictions: No  ?  ? ?Mobility ? Bed Mobility ?Overal bed mobility: Independent ?  ?  ?  ?  ?  ?  ?  ?  ? ?Transfers ?Overall transfer level: Independent ?Equipment used: None ?Transfers: Sit to/from Stand ?  ?  ?  ?  ?  ?  ?  ?  ? ?Ambulation/Gait ?Ambulation/Gait assistance: Independent ?Gait Distance (Feet): 200 Feet ?Assistive device:  None ?Gait Pattern/deviations: WFL(Within Functional Limits), Decreased stride length ?Gait velocity: 0.83 m/s ?Gait velocity interpretation: >2.62 ft/sec, indicative of community ambulatory ?  ?General Gait Details: good stability without DME, no overt LOB. ? ? ?Stairs ?Stairs: Yes ?Stairs assistance: Supervision ?Stair Management: One rail Left, Alternating pattern ?Number of Stairs: 10 ?General stair comments: VSS on RA, SpO2 97% ? ? ? ? ?  ?Balance Overall balance assessment: Mild deficits observed, not formally tested ?  ?  ?  ?  ?  ?  ?  ?  ?  ?  ?  ?  ?  ?  ?  ?  ?  ?  ?  ? ?  ?Cognition Arousal/Alertness: Awake/alert ?Behavior During [facility]: [facility] assessed/performed ?Overall Cognitive Status: Within Functional Limits for tasks assessed ?  ?  ?  ?  ?  ?  ?  ?  ?  ?  ?  ?  ?  ?  ?  ?  ?  ?  ?  ? ?  ?   ?General Comments General comments (skin integrity, edema, etc.): VSS on RA with all activity ?  ?  ? ?Pertinent Vitals/Pain Pain Assessment ?Pain Assessment: No/denies pain  ? ? ? ?PT Goals (current goals can now be found in the care plan section) Acute Rehab PT Goals ?Patient Stated Goal: return home and to work ?PT Goal Formulation: With patient ?Time For Goal Achievement: 02/16/22 ?Potential to Achieve Goals: Good ?Progress towards PT goals: Progressing toward goals ? ?  ?Frequency ? ? ? Min 3X/week ? ? ? ?  ?  PT Plan Current plan remains appropriate  ? ? ?   ?AM-PAC PT "6 Clicks" Mobility   ?Outcome Measure ? Help needed turning from your back to your side while in a flat bed without using bedrails?: None ?Help needed moving from lying on your back to sitting on the side of a flat bed without using bedrails?: None ?Help needed moving to and from a bed to a chair (including a wheelchair)?: None ?Help needed standing up from a chair using your arms (e.g., wheelchair or bedside chair)?: None ?Help needed to walk in hospital room?: A Little ?Help needed climbing 3-5 steps with a railing? : A Little ?6  Click Score: 22 ? ?  ?End of Session   ?Activity Tolerance: Patient tolerated treatment well ?Patient left: in chair;with call bell/phone within reach;with family/visitor present ?Nurse Communication: Mobility status ?PT Visit Diagnosis: Unsteadiness on feet (R26.81) ?  ? ? ?Time: 8811-0315 ?PT Time Calculation (min) (ACUTE ONLY): 9 min ? ?Charges:  $Therapeutic Exercise: 8-22 mins          ?          ? ?Gwendolyn [name], PT, DPT  ? ?Acute Rehabilitation Department ?Pager #: [phone] - [phone] ? ? ?Gwendolyn [name] ?02/07/2022, 9:55 AM ? ?

## 2022-02-07 NOTE — Progress Notes (Signed)
Heart Failure Nurse Navigator Progress Note ? ?Removed HV TOC clinic appt 4/10 as patient has close follow up with Dr. Einar Gip and [facility] 4/27. HFpEF, no social needs noted.  ? ?Pricilla Holm, MSN, RN ?Heart Failure Nurse Navigator ?[phone] ? ?

## 2022-02-07 NOTE — Progress Notes (Signed)
ABI 02/07/2022: ?Right: Resting right ankle-brachial index indicates moderate right lower extremity arterial disease.  ABI at PTA 0.66 and DP 0.65 with biphasic waveforms the right toe-brachial index is abnormal at 0.50. ?? ?Left: Resting left ankle-brachial index indicates moderate left lower extremity arterial disease.  ABI at PT 0.64 and DP 0.61 the left toe-brachial index is abnormal at 0.54.

## 2022-02-07 NOTE — Progress Notes (Signed)
Subjective:  ?She is walking in the room, states that she had a good nights rest and she is ready to go home.  Denies any dyspnea, leg edema. ? ? ?Objective:  ?Vital Signs in the last 24 hours: ?Temp:  [98.2 ?F (36.8 ?C)-99.6 ?F (37.6 ?C)] 98.8 ?F (37.1 ?C) (04/03 1275) ?Pulse Rate:  [66-78] 78 (04/03 0954) ?Resp:  [14-18] 18 (04/03 0954) ?BP: (146-192)/(50-65) 192/64 (04/03 0954) ?SpO2:  [95 %-98 %] 98 % (04/03 0954) ?Weight:  [61.8 kg] 61.8 kg (04/03 0508) ? ?Intake/Output from previous day: ?04/02 0701 - 04/03 0700 ?In: 2 [P.O.:560] ?Out: 1800 [Urine:1800] ? ?Physical Exam ?Vitals and nursing note reviewed.  ?Constitutional:   ?   General: She is not in acute distress. ?Neck:  ?   Vascular: No JVD.  ?Cardiovascular:  ?   Rate and Rhythm: Normal rate and regular rhythm.  ?   Pulses:     ?     Femoral pulses are 2+ on the right side and 2+ on the left side. ?     Dorsalis pedis pulses are 2+ on the right side and 2+ on the left side.  ?     Posterior tibial pulses are 2+ on the right side.  ?   Heart sounds: Normal heart sounds. No murmur heard. ?Pulmonary:  ?   Effort: Pulmonary effort is normal.  ?   Breath sounds: Normal breath sounds. No wheezing or rales.  ? ? ?Cardiac Studies: ? ?Imaging/tests reviewed and independently interpreted ?Abdominal arteriogram and selective left renal arteriogram and lithotripsy balloon angioplasty of the suprarenal abdominal aorta using shockwave M5 catheter 02/03/2022: ?Abdominal aorta: 2 renal arteries 1 on either sides, mild atherosclerotic changes are present.  Abdominal aorta is diffusely diseased and calcific.  There is a high-grade 90% to 99% stenosis of the suprarenal abdominal aorta with heavy calcification. ?Intervention data: Successful shockwave lithotripsy using a 8.0 x 60 mm shockwave M5 balloon, gradual progression of balloon inflation pressure from 4 atmospheric pressure already up to 10 atm pressure and using a 8.43 mm lumen, followed by balloon angioplasty with  a 4.0 x 60 mm Mustang balloon at 4 atmospheric pressure. ?Stenosis reduced from 95% to probably insignificant stenosis (<50%).  Angiogram not performed to conserve contrast and would have been poorly visualized with CO2. ?  ?Pressure gradient pre and post:  ?Opening right common femoral artery pressure 52/44 mmHg.  Suprarenal thoracic aortic pressure 180/58 mmHg with a mean of 103 mmHg.  Pressure gradient of approximately 130 mmHg.  Closing right femoral artery pressure 121/62 mmHg.  Pressure gradient approximately 30 mmHg post angioplasty. ? ?5-10 mL contrast utilized 20 to visualize abdominal aorta.  Right femoral arterial access closed with Perclose. ? ?Recommendation: Start the patient on Plavix, I expect improvement in renal function, do not treat hypertension unless blood pressure >160 mmHg.  We will change all the antihypertensive medications. ? ?EKG 02/03/2022: ?Normal sinus rhythm ?Minimal voltage criteria for LVH, may be normal variant ( Cornell product ) ?Nonspecific T wave abnormality ?Abnormal ECG ? ?Telemetry 02/05/2022: ?No significant arhythmia ? ?Echocardiogram 02/03/2022: ? 1. Left ventricular ejection fraction, by estimation, is 50 to 55%. The  ?left ventricle has low normal function. The left ventricle has no regional  ?wall motion abnormalities. There is mild left ventricular hypertrophy.  ?Left ventricular diastolic  ?parameters are consistent with Grade II diastolic dysfunction  ?(pseudonormalization).  ? 2. Right ventricular systolic function is normal. The right ventricular  ?size is normal.  ? 3.  Left atrial size was severely dilated.  ? 4. The mitral valve is normal in structure. Mild mitral valve  ?regurgitation. No evidence of mitral stenosis.  ? 5. The aortic valve is tricuspid. Aortic valve regurgitation is moderate.  ? 6. The inferior vena cava is normal in size with <50% respiratory  ?variability, suggesting right atrial pressure of 8 mmHg.  ? ?Comparison(s): A prior study was performed  on 09/09/2022. No significant  ?change.  ? ?Assessment & Recommendations: ? ?66 year old Caucasian female with resistant hypertension, mixed hyperlipidemia, nicotine dependence, COPD, known distal aorta stenosis, presentation on 02/03/2022 with acute on chronic HFpEF, hypertensive emergency ? ?Hypertensive emergency secondary to renovascular hypertension and also primary hypertension now resolved: ?HFpEF Secondary to uncontrolled hypertension. ?PAD symptoms of claudication, now she has bounding pulses in the lower extremity ?Hyperlipidemia ?Acute renal failure stage V without dialysis secondary to ATN, now resolved, presently has stage IIIa CKD which I suspect should improve over time. ?Tobacco use disorder ? ?Recommendation: Now that she has been stable for >72 hours, serum creatinine is nicely coming down, I have recommended that we could now try to have optimal blood pressure control.  I have discontinued Imdur and switched her to isosorbide dinitrate 30 mg 3 times daily and added back hydralazine 50 mg p.o. 3 times daily.  Labetalol dosage can be increased to 3 times daily dosing if necessary.  It is okay to discharge her on a higher blood pressure as she has been hypertensive for a very long time, I will initiate ACE inhibitor therapy in the outpatient basis. ? ?I suspect she will need repeat peripheral arteriogram to relook at the aortic stenosis for completion.  I could not see her aorta by angiography due to acute renal failure. ? ?With regard to tobacco use, she is wanting to use Nicorette gum, advised her that she could use the Nicorette gum for about 4 to 6 weeks but no more than that.  She has an appointment to see me on 03/03/2022. ? ?She has been set up for a office visit with [facility] I have no idea why, patient sees me on a regular basis in the office. ?

## 2022-02-07 NOTE — TOC Transition Note (Addendum)
Transition of Care (TOC) - CM/SW Discharge Note ? ? ?Patient Details  ?Name: Gwendolyn [name] ?MRN: [id number] ?Date of Birth: 06-04-1956 ? ?Transition of Care (TOC) CM/SW Contact:  ?Zenon Mayo, RN ?Phone Number: ?02/07/2022, 9:50 AM ? ? ?Clinical Narrative:    ?Patient is for dc today, TOC to bring medications to room prior to dc.  Son will transport home at [employer]. ? ? ?Final next level of care: Home/Self Care ?Barriers to Discharge: Continued Medical Work up ? ? ?Patient Goals and CMS Choice ?Patient states their goals for this hospitalization and ongoing recovery are:: return home ?  ?Choice offered to / list presented to : NA ? ?Discharge Placement ?  ?           ?  ?  ?  ?  ? ?Discharge Plan and Services ?In-house Referral: NA ?Discharge Planning Services: CM Consult ?Post Acute Care Choice: NA          ?  ?DME Agency: NA ?  ?  ?  ?HH Arranged: NA ?  ?  ?  ?  ? ?Social Determinants of Health (SDOH) Interventions ?Food Insecurity Interventions: Intervention Not Indicated ?Financial Strain Interventions: Intervention Not Indicated ?Housing Interventions: Intervention Not Indicated ?Transportation Interventions: Intervention Not Indicated ? ? ?Readmission Risk Interventions ? ?  02/04/2022  ?  3:13 PM  ?Readmission Risk Prevention Plan  ?Transportation Screening Complete  ?PCP or Specialist Appt within 3-5 Days Complete  ?[place] or Home Care Consult Complete  ?Social Work Consult for [place] Planning/Counseling Complete  ?Palliative Care Screening Not Applicable  ?Medication Review Press photographer) Complete  ? ? ? ? ? ?

## 2022-02-07 NOTE — Progress Notes (Signed)
Completed discharge instructions. Explained discharge summary. Reviewed follow up appointments and next medication administration times. Patient verbalized having an understanding of the discharge instructions. Removed patient's central telemetry monitor wasn't on at the time of discharging home. Transported downstairs for discharge home. All belongings to include cell phone, chargers, clothing, and all other items listed on the admission was in patient's care at the time of discharge.  ?

## 2022-02-07 NOTE — Plan of Care (Signed)
?  Problem: Education: ?Goal: Knowledge of General Education information will improve ?Description: Including pain rating scale, medication(s)/side effects and non-pharmacologic comfort measures ?Outcome: Adequate for Discharge ?  ?Problem: Clinical Measurements: ?Goal: Ability to maintain clinical measurements within normal limits will improve ?Outcome: Adequate for Discharge ?Goal: Respiratory complications will improve ?Outcome: Adequate for Discharge ?  ?Problem: Activity: ?Goal: Risk for activity intolerance will decrease ?Outcome: Adequate for Discharge ?  ?Problem: Education: ?Goal: Ability to demonstrate management of disease process will improve ?Outcome: Adequate for Discharge ?Goal: Ability to verbalize understanding of medication therapies will improve ?Outcome: Adequate for Discharge ?Goal: Individualized Educational Video(s) ?Outcome: Adequate for Discharge ?  ?Problem: Activity: ?Goal: Capacity to carry out activities will improve ?Outcome: Adequate for Discharge ?  ?

## 2022-02-07 NOTE — Care Management Important Message (Signed)
Important Message ? ?Patient Details  ?Name: Gwendolyn [name] ?MRN: [id number] ?Date of Birth: 12/17/1955 ? ? ?Medicare Important Message Given:  Yes ? ? ? ? ?Shelda Altes ?02/07/2022, 8:58 AM ?

## 2022-02-07 NOTE — Progress Notes (Signed)
Heart Failure Stewardship Pharmacist Progress Note ? ? ?PCP: Jilda Panda, MD ?PCP-Cardiologist: None  ? ? ?HPI:  ?66 yo F with PMH of COPD, tobacco abuse, HTN, aortic stenosis, and CHF. She presented to the ED on 3/28 with shortness of breath and orthopnea. CXR with cardiomegaly and pulmonary vascular congestion. Her last ECHO from 09/2021 with LVEF of 55-60% and G2DD. Renal recovery significant.  ? ?Current HF Medications: ?Other: Imdur 45 mg daily ? ?Prior to admission HF Medications: ?Diuretic: furosemide 40 mg daily ?ACE/ARB/ARNI: olmesartan 40 mg daily ?Other: hydralazine 50 mg TID ? ?Pertinent Lab Values: ?Serum creatinine 1.26, BUN 36, Potassium 4.4, Sodium 134, BNP 1788.9  ? ?Vital Signs: ?Weight: 136 lbs (admission weight: 142 lbs) ?Blood pressure: 190/50s (goal SBP for renal perfusion 140-150s) ?Heart rate: 70s  ?I/O: -1.3L out yesterday, net -5.6L ? ?Medication Assistance / Insurance Benefits Check: ?Does the patient have prescription insurance?  Yes ?Type of insurance plan: [facility] Medicare ? ?Outpatient Pharmacy:  ?Prior to admission outpatient pharmacy: [facility] ?Is the patient willing to use [facility] pharmacy at discharge? Yes ?Is the patient willing to transition their outpatient pharmacy to utilize a [facility] outpatient pharmacy?   Pending ?  ? ?Assessment: ?1. Acute on chronic diastolic CHF (EF 17-40%), due to presumed NICM - tobacco and uncontrolled HTN. NYHA class III symptoms. ?- GDMT limited with AKI - improved from 6.77 to 1.26 ?- Off hydralazine 100 mg TID - consider resuming to better control BP ?- Agree with increasing to Imdur 45 mg daily ?  ?Plan: ?1) Medication changes recommended at this time: ?-  Resume hydralazine 50 mg TID ? ?2) Patient assistance: ?- Lisabeth Register, Jardiance all $47 copay ? ? ?Kerby Nora, PharmD, BCPS ?Heart Failure Stewardship Pharmacist ?Phone [phone] ? ? ?

## 2022-02-07 NOTE — Progress Notes (Signed)
VASCULAR LAB ? ? ? ?ABIs have been performed. ? ?See CV proc for preliminary results. ? ? ?Shai Rasmussen, RVT ?02/07/2022, 11:41 AM ? ?

## 2022-02-14 ENCOUNTER — Other Ambulatory Visit (HOSPITAL_COMMUNITY): Payer: Self-pay

## 2022-02-14 ENCOUNTER — Telehealth (HOSPITAL_COMMUNITY): Payer: Self-pay

## 2022-02-14 ENCOUNTER — Encounter (HOSPITAL_COMMUNITY): Payer: Medicare Other

## 2022-02-14 NOTE — Telephone Encounter (Signed)
TOC OUTPATIENT PHARMACY FOLLOW-UP ?Call #1 attempted for [facility] pharmacy follow-up.  Left VM Will follow up in 2-3 days.  ? ?

## 2022-02-17 ENCOUNTER — Telehealth (HOSPITAL_COMMUNITY): Payer: Self-pay

## 2022-02-17 NOTE — Telephone Encounter (Signed)
Transitions of Care Pharmacy   Call attempted for a pharmacy transitions of care follow-up. HIPAA appropriate voicemail was left with call back information provided.   Call attempt #2. Will follow-up in 2-3 days.    

## 2022-02-18 ENCOUNTER — Telehealth (HOSPITAL_COMMUNITY): Payer: Self-pay

## 2022-02-18 NOTE — Telephone Encounter (Signed)
Transitions of Care Pharmacy   Call attempted for a pharmacy transitions of care follow-up. HIPAA appropriate voicemail was left with call back information provided.   Call attempt #3. Will no longer attempt follow up for TOC pharmacy.   

## 2022-02-21 ENCOUNTER — Other Ambulatory Visit: Payer: Medicare Other

## 2022-02-21 NOTE — Discharge Summary (Addendum)
Physician Discharge Summary  ?Gwendolyn [name] GYK:[id number] DOB: Jul 03, 1956 DOA: 02/01/2022 ? ?PCP: Jilda Panda, MD ? ?Admit date: 02/01/2022 ?Discharge date: 02/07/2022 ? ?Time spent: 35 minutes ? ?Recommendations for Outpatient Follow-up:  ?Cardiology Dr. Einar Gip in 1 to 2 weeks, please check BMP at follow-up, titrate antihypertensives ? ? ?Discharge Diagnoses:  ?Acute kidney injury ?Suprarenal abdominal aortic stenosis ?  CHF (congestive heart failure) ([place]) ?  Acute on chronic heart failure with preserved ejection fraction ([place]) ?  COPD with acute exacerbation ([place]) ?  Respiratory distress ?  AKI (acute kidney injury) ([place]) ?  Abdominal aortic stenosis ?  Peripheral artery disease ([place]) ?  H/O renal atherosclerosis ? ? ?Discharge Condition: Stable ? ?Diet recommendation: Low-sodium ? ?Filed Weights  ? 02/05/22 0539 02/06/22 0500 02/07/22 0508  ?Weight: 62.8 kg 61.8 kg 61.8 kg  ? ? ?History of present illness:  ?65/F with history of COPD, tobacco abuse, resistant hypertension, high-grade distal aortic stenosis, chronic diastolic CHF presented to the ED 3/28 with worsening dyspnea.  Recently Lasix dose was increased to 40 Mg daily. ?-In the ED she was acutely dyspneic, hypoxic, chest x-ray noted pulmonary vascular congestion and interstitial edema, labs noted BNP of 1788, creatinine of 1.9 up from baseline of 0.7 ?- w/ SEVERE OLIGURIC AKI  ? ?Hospital Course:  ? ?Acute kidney injury ?-Baseline creatinine around 0.7,->1.9> 3.1>5.8>6.7 ?-This was felt to be hemodynamically mediated, relative hypotension(214>140) and high-grade suprarenal abdominal aortic stenosis>95%, ongoing ARB use ?-Improving well, good urine output, creatinine down to 1.2 at discharge ?-Discharged home in a stable condition ?  ?Suprarenal abdominal aortic stenosis ?-High-grade, 90-99%, treated with shockwave lithotripsy, balloon angioplasty stenosis reduced from 95% to<50% per Dr.Ganji 3/30 ?-Continue Plavix ?-Follow-up with Dr. Einar Gip, continue  antiplatelet agents ?  ?Acute hypoxic respiratory failure ?Acute diastolic CHF ?-Recent echo 11/22 noted EF of 55-60% ?-Improving, see discussion above ?-Improved, auto diuresing, discharged on Lasix 20 Mg daily ?  ?Uncontrolled hypertension ?-Now stable, continue amlodipine, labetalol, Isordil and hydralazine ?  ?COPD ?Tobacco abuse ?-Clinically do not suspect COPD exacerbation at this time, antibiotics discontinued ?-Continue ICS/LABA, DuoNebs ?-Counseled regarding smoking cessation ?  ?Hypothyroidism ?-Continue Synthroid ? ? ? ?Consultants:  ?Nephrology, cardiology ?  ?Procedures: ?  ?Dr.Ganji: Abdominal arteriogram and selective left renal arteriogram and lithotripsy balloon angioplasty of the suprarenal abdominal aorta using shockwave M5 catheter 01/24/2022: ?Abdominal aorta: 2 renal arteries 1 on either sides, mild atherosclerotic changes are present.  Abdominal aorta is diffusely diseased and calcific.  There is a high-grade 90% to 99% stenosis of the suprarenal abdominal aorta with heavy calcification. ?Intervention data: Successful shockwave lithotripsy using a 8.0 x 60 mm shockwave M5 balloon, gradual progression of balloon inflation pressure from 4 atmospheric pressure already up to 10 atm pressure and using a 8.43 mm lumen, followed by balloon angioplasty with a 4.0 x 60 mm Mustang balloon at 4 atmospheric pressure. ?Stenosis reduced from 95% to probably insignificant stenosis (<50%).  Angiogram not performed to conserve contrast and would have been poorly visualized with CO2 ? ?Discharge Exam: ?Vitals:  ? 02/07/22 0954 02/07/22 1209  ?BP: (!) 192/64   ?Pulse: 78   ?Resp: 18   ?Temp: 98.8 ?F (37.1 ?C)   ?SpO2: 98% 98%  ? ?  ?Gen: Awake, Alert, Oriented X 3, chronically ill female ?HEENT: no JVD ?Lungs: Poor air movement otherwise clear ?CVS: S1S2/RRR ?Abd: soft, Non tender, non distended, BS present ?Extremities: No edema ?Skin: no new rashes on exposed skin  ? ?Discharge Instructions ? ? ?  Discharge  Instructions   ? ? Diet - low sodium heart healthy   Complete by: As directed ?  ? Increase activity slowly   Complete by: As directed ?  ? ?  ? ?Allergies as of 02/07/2022   ? ?   Reactions  ? Doxycycline Anaphylaxis  ? Azilsartan   ? Other reaction(s): cough  ? Biaxin [clarithromycin] Other (See Comments)  ? Vertigo  ? Floxacillin [floxacillin (flucloxacillin)] Other (See Comments)  ? Vertigo  ? Morphine And Related Other (See Comments)  ? Headache  ? ?  ? ?  ?Medication List  ?  ? ?STOP taking these medications   ? ?olmesartan 40 MG tablet ?Commonly known as: BENICAR ?  ? ?  ? ?TAKE these medications   ? ?amLODipine 5 MG tablet ?Commonly known as: NORVASC ?Take 10 mg by mouth daily. ?  ?aspirin 81 MG EC tablet ?Take 1 tablet (81 mg total) by mouth daily. Swallow whole. ?  ?atorvastatin 80 MG tablet ?Commonly known as: LIPITOR ?Take 80 mg by mouth daily. ?  ?Breztri Aerosphere 160-9-4.8 MCG/ACT Aero ?Generic drug: Budeson-Glycopyrrol-Formoterol ?Inhale 2 puffs into the lungs in the morning and at bedtime. ?  ?clopidogrel 75 MG tablet ?Commonly known as: PLAVIX ?Take 1 tablet (75 mg total) by mouth daily with breakfast. ?  ?furosemide 40 MG tablet ?Commonly known as: LASIX ?Take 0.5 tablets (20 mg total) by mouth daily. ?What changed: how much to take ?  ?Fusion Plus Caps ?Take 1 capsule by mouth every other day. ?  ?hydrALAZINE 50 MG tablet ?Commonly known as: APRESOLINE ?TAKE 1 TABLET(50 MG) BY MOUTH THREE TIMES DAILY ?What changed:  ?how much to take ?how to take this ?when to take this ?additional instructions ?  ?isosorbide dinitrate 30 MG tablet ?Commonly known as: ISORDIL ?Take 1 tablet (30 mg total) by mouth 3 (three) times daily. ?  ?labetalol 200 MG tablet ?Commonly known as: NORMODYNE ?Take 1 tablet (200 mg total) by mouth 2 (two) times daily. 300 MG IN THE AM 200 MG IN THE PM ?What changed:  ?how much to take ?additional instructions ?  ?levothyroxine 137 MCG tablet ?Commonly known as: SYNTHROID ?Take 137  mcg by mouth daily before breakfast. ?  ?potassium chloride 10 MEQ tablet ?Commonly known as: KLOR-CON ?Take 1 tablet (10 mEq total) by mouth daily as needed. With Furosemide only ?  ? ?  ? ?Allergies  ?Allergen Reactions  ? Doxycycline Anaphylaxis  ? Azilsartan   ?  Other reaction(s): cough  ? Biaxin [Clarithromycin] Other (See Comments)  ?  Vertigo  ? Floxacillin [Floxacillin (Flucloxacillin)] Other (See Comments)  ?  Vertigo  ? Morphine And Related Other (See Comments)  ?  Headache  ? ? Follow-up Information   ? ? Ralene Ok, MD Follow up on 02/10/2022.   ?Specialty: Internal Medicine ?Why: 12 noon for hospital follow up ?Contact information: ?[address] ?[place] [address] ?[phone] ? ? ?  ?  ? ? Yates Decamp, MD Follow up on 03/03/2022.   ?Specialty: Cardiology ?Why: 03/03/2022 2:00 PM (Arrive by 1:45 PM) ?Contact information: ?[address] ?[address] ?[place] [address] ?[phone] ? ? ?  ?  ? ?  ?  ? ?  ? ? ? ?The results of significant diagnostics from this hospitalization (including imaging, microbiology, ancillary and laboratory) are listed below for reference.   ? ?Significant Diagnostic Studies: ?DG Chest 1 View ? ?Result Date: 02/02/2022 ?CLINICAL DATA:  Shortness of breath EXAM: CHEST  1 VIEW COMPARISON:  Chest x-ray 02/01/2022 FINDINGS: Cardiomegaly and mediastinum appear stable. Calcified plaques in the aortic arch. Mild central pulmonary vascular prominence, improved since previous study. Small bilateral pleural effusions with associated atelectasis. No pneumothorax. IMPRESSION: Interval improved pulmonary vascular congestion. Small bilateral pleural effusions. Electronically Signed   By: Ofilia Neas M.D.   On: 02/02/2022 08:27  ? ?US RENAL ? ?Result Date: 02/01/2022 ?CLINICAL DATA:  Acute kidney injury EXAM: RENAL / URINARY TRACT ULTRASOUND COMPLETE COMPARISON:  02/18/2016 abdomen ultrasound FINDINGS: Right Kidney: Renal measurements: 10.6 x 3.9 x 4.5 cm = volume: 98 mL.  Echogenicity within normal limits. No mass or hydronephrosis visualized. Shadowing, nonobstructing calculi, measuring up to 1.8 cm. Left Kidney: Renal measurements: 11.9 x 3.8.3 cm = volume: 102 mL. Increased echogen

## 2022-02-23 ENCOUNTER — Other Ambulatory Visit: Payer: Medicare Other

## 2022-02-24 ENCOUNTER — Other Ambulatory Visit: Payer: Medicare Other

## 2022-02-28 ENCOUNTER — Ambulatory Visit: Payer: Medicare Other | Admitting: Cardiology

## 2022-03-03 ENCOUNTER — Ambulatory Visit: Payer: Medicare Other | Admitting: Cardiology

## 2022-03-28 ENCOUNTER — Telehealth: Payer: Self-pay | Admitting: Cardiology

## 2022-03-28 ENCOUNTER — Other Ambulatory Visit: Payer: Self-pay | Admitting: Student

## 2022-03-28 ENCOUNTER — Other Ambulatory Visit: Payer: Medicare Other

## 2022-03-28 DIAGNOSIS — R0609 Other forms of dyspnea: Secondary | ICD-10-CM

## 2022-03-28 NOTE — Telephone Encounter (Signed)
Hey can you attach an order for her stress test? She's here right now for her stress test and there is no order.

## 2022-04-04 NOTE — Progress Notes (Deleted)
Primary Physician/Referring:  Jilda Panda, MD  Patient ID: Gwendolyn [name], female    DOB: 01-Mar-1956, 66 y.o.   MRN: [id number]  No chief complaint on file.  HPI:    Gwendolyn [name]  is a 66 y.o. Caucasian female patient with longstanding history of hypertension, hyperlipidemia, tobacco use disorder, staghorn renal calculus, seen in the emergency room for abdominal discomfort on 08/27/2021 and CT of the abdomen and reviewed severe abdominal aortic stenosis and renal artery stenosis along with mild colitis.  She is now referred to me for evaluation of vascular disease.  She has bilateral thigh and also calf claudication with walking ongoing for several months and has remained stable.  Denies any ulcerations or rest pain.  She has chronic shortness of breath and dyspnea on exertion.  Fortunately she has not had any GI bleed, she remained stable otherwise.  She is tolerating statins, and in spite of multiple blood pressure medications, states that her blood pressure continues to remain high.  She is smoking about 1 pack of cigarettes a day.  Denies chest pain or palpitations.  Past Medical History:  Diagnosis Date   Anemia    Anxiety    Asthma    COPD (chronic obstructive pulmonary disease) (HCC)    Depression    GERD (gastroesophageal reflux disease)    History of kidney stones    Hypertension    Hypothyroidism    Past Surgical History:  Procedure Laterality Date   ABDOMINAL AORTOGRAM W/LOWER EXTREMITY N/A 02/03/2022   Procedure: ABDOMINAL AORTOGRAM W/LOWER EXTREMITY;  Surgeon: Adrian Prows, MD;  Location: [facility];  Service: Cardiovascular;  Laterality: N/A;   ABDOMINAL HYSTERECTOMY     Cesarean Section     (2)   CHOLECYSTECTOMY     COLONOSCOPY  2020   Gastric Ulcer  2020   PERIPHERAL VASCULAR BALLOON ANGIOPLASTY  02/03/2022   Procedure: PERIPHERAL VASCULAR BALLOON ANGIOPLASTY;  Surgeon: Adrian Prows, MD;  Location: [facility];  Service: Cardiovascular;;    RENAL ANGIOGRAPHY N/A 02/03/2022   Procedure: RENAL ANGIOGRAPHY;  Surgeon: Adrian Prows, MD;  Location: [facility];  Service: Cardiovascular;  Laterality: N/A;   UMBILICAL HERNIA REPAIR N/A 10/29/2020   Procedure: OPEN UMBILICAL HERNIA REPAIR WITH MESH;  Surgeon: Stechschulte, Nickola Major, MD;  Location: [facility];  Service: General;  Laterality: N/A;   Family History  Problem Relation Age of Onset   Colon cancer Mother    Diabetes Father    Diabetes Sister    Breast cancer Sister 50   Hypertension Sister     Social History   Tobacco Use   Smoking status: Every Day    Packs/day: 1.00    Years: 40.00    Pack years: 40.00    Types: Cigarettes   Smokeless tobacco: Never  Substance Use Topics   Alcohol use: Never   Marital Status: Widowed  ROS  Review of Systems  Cardiovascular:  Positive for claudication (bilateral thigh and calf). Negative for chest pain, dyspnea on exertion and leg swelling.  Respiratory:  Positive for cough.   Gastrointestinal:  Negative for melena.  Objective  There were no vitals taken for this visit. There is no height or weight on file to calculate BMI.     02/07/2022    9:54 AM 02/07/2022    5:08 AM 02/07/2022    3:50 AM  Vitals with BMI  Weight  136 lbs 3 oz   BMI  40.98   Systolic 119  809  Diastolic 64  58  Pulse 78  72    Physical Exam Neck:     Vascular: Carotid bruit (bilateral) present. No JVD.  Cardiovascular:     Rate and Rhythm: Normal rate and regular rhythm.     Pulses: Intact distal pulses.          Radial pulses are 2+ on the right side and 2+ on the left side.       Femoral pulses are 0 on the right side and 0 on the left side.      Popliteal pulses are 1+ on the right side and 1+ on the left side.       Dorsalis pedis pulses are 1+ on the right side and 1+ on the left side.       Posterior tibial pulses are 0 on the right side and 0 on the left side.     Heart sounds: Normal heart sounds. No murmur heard.   No gallop.  Pulmonary:      Effort: Pulmonary effort is normal.     Breath sounds: Normal breath sounds.  Abdominal:     General: Bowel sounds are normal.     Palpations: Abdomen is soft.  Musculoskeletal:     Right lower leg: No edema.     Left lower leg: No edema.  Skin:    Capillary Refill: Capillary refill takes less than 2 seconds.     Laboratory examination:   Recent Labs    02/05/22 0346 02/06/22 0448 02/07/22 0259  NA 132* 134* 134*  K 4.5 4.5 4.4  CL 102 104 103  CO2 19* 23 24  GLUCOSE 100* 99 110*  BUN 72* 50* 36*  CREATININE 4.83* 2.01* 1.26*  CALCIUM 8.6* 8.7* 8.5*  GFRNONAA 9* 27* 47*   CrCl cannot be calculated (Patient's most recent lab result is older than the maximum 21 days allowed.).     Latest Ref Rng & Units 02/07/2022    2:59 AM 02/06/2022    4:48 AM 02/05/2022    3:46 AM  CMP  Glucose 70 - 99 mg/dL 110   99   100    BUN 8 - 23 mg/dL 36   50   72    Creatinine 0.44 - 1.00 mg/dL 1.26   2.01   4.83    Sodium 135 - 145 mmol/L 134   134   132    Potassium 3.5 - 5.1 mmol/L 4.4   4.5   4.5    Chloride 98 - 111 mmol/L 103   104   102    CO2 22 - 32 mmol/L _0 Calcium 8.9 - 10.3 mg/dL 8.5   8.7   8.6        Latest Ref Rng & Units 02/06/2022    4:48 AM 02/04/2022    4:25 AM 02/03/2022    6:05 AM  CBC  WBC 4.0 - 10.5 K/uL 11.1   11.8   16.0    Hemoglobin 12.0 - 15.0 g/dL 9.9   9.0   9.2    Hematocrit 36.0 - 46.0 % 30.5   28.4   28.1    Platelets 150 - 400 K/uL 204   204   217      Lipid Panel Recent Labs    02/04/22 0425  CHOL 126  TRIG 147  LDLCALC 70  VLDL 29  HDL 27*  CHOLHDL 4.7   Lipid Panel  Component Value Date/Time   CHOL 126 02/04/2022 0425   TRIG 147 02/04/2022 0425   HDL 27 (L) 02/04/2022 0425   CHOLHDL 4.7 02/04/2022 0425   VLDL 29 02/04/2022 0425   LDLCALC 70 02/04/2022 0425     HEMOGLOBIN A1C No results found for: HGBA1C, MPG TSH Recent Labs    08/27/21 1351 02/01/22 1522  TSH 0.706 0.225*    External labs:    NA Medications and allergies   Allergies  Allergen Reactions   Doxycycline Anaphylaxis   Azilsartan     Other reaction(s): cough   Biaxin [Clarithromycin] Other (See Comments)    Vertigo   Floxacillin [Floxacillin (Flucloxacillin)] Other (See Comments)    Vertigo   Morphine And Related Other (See Comments)    Headache     Medication list after today's encounter   Current Outpatient Medications:    amLODipine (NORVASC) 5 MG tablet, Take 10 mg by mouth daily., Disp: , Rfl:    aspirin EC 81 MG EC tablet, Take 1 tablet (81 mg total) by mouth daily. Swallow whole., Disp: 30 tablet, Rfl: 11   atorvastatin (LIPITOR) 80 MG tablet, Take 80 mg by mouth daily., Disp: , Rfl:    Budeson-Glycopyrrol-Formoterol (BREZTRI AEROSPHERE) 160-9-4.8 MCG/ACT AERO, Inhale 2 puffs into the lungs in the morning and at bedtime., Disp: , Rfl:    clopidogrel (PLAVIX) 75 MG tablet, Take 1 tablet (75 mg total) by mouth daily with breakfast., Disp: 30 tablet, Rfl: 0   furosemide (LASIX) 40 MG tablet, Take 0.5 tablets (20 mg total) by mouth daily., Disp: , Rfl: 0   hydrALAZINE (APRESOLINE) 50 MG tablet, TAKE 1 TABLET(50 MG) BY MOUTH THREE TIMES DAILY (Patient taking differently: Take 50 mg by mouth 3 (three) times daily.), Disp: 90 tablet, Rfl: 3   Iron-FA-B Cmp-C-Biot-Probiotic (FUSION PLUS) CAPS, Take 1 capsule by mouth every other day., Disp: , Rfl:    isosorbide dinitrate (ISORDIL) 30 MG tablet, Take 1 tablet (30 mg total) by mouth 3 (three) times daily., Disp: 90 tablet, Rfl: 0   labetalol (NORMODYNE) 200 MG tablet, Take 1 tablet (200 mg total) by mouth 2 (two) times daily. 300 MG IN THE AM 200 MG IN THE PM, Disp: , Rfl:    levothyroxine (SYNTHROID) 137 MCG tablet, Take 137 mcg by mouth daily before breakfast., Disp: , Rfl:    potassium chloride (KLOR-CON) 10 MEQ tablet, Take 1 tablet (10 mEq total) by mouth daily as needed. With Furosemide only, Disp: 30 tablet, Rfl: 2   Radiology:   CT of the abdomen and  pelvis 08/27/2021: 1. Very severe calcified atherosclerosis of the abdominal aorta with NEAR AORTIC OCCLUSION at the SMA level. Associated High-grade Stenosis of nearly all major aortic branch origins. And high-grade stenosis of the left Common iliac artery also due to bulky calcified plaque. Recommend Vascular Surgery Consultation. Aortic Atherosclerosis (ICD10-I70.0).   2. Superimposed mild inflammatory stranding in the right upper quadrant, near the porta hepatis and hepatic flexure of colon. This might be a mild Acute Colitis - perhaps a low-grade ischemic colitis in light of #1, uncertain.   3. Trace bilateral pleural effusions, larger on the right. Cardiac enlargement since 2008, borderline cardiomegaly with trace pericardial fluid.   4. No other acute or inflammatory process identified in the abdomen or pelvis. Bilateral nephrolithiasis with developing staghorn calculus in the right lower pole. Chronic postinflammatory calcification of upper mesenteric lymph nodes, an area affected by acute pancreatitis in 2008.  Cardiac Studies:   PCV ECHOCARDIOGRAM  COMPLETE 09/09/2021  Normal LV systolic function with visual EF 55-60%. Left ventricle cavity is normal in size. Mild left ventricular hypertrophy. Normal global wall motion. Doppler evidence of grade II diastolic dysfunction, elevated LAP. Left atrial cavity is severely dilated. Aortic valve sclerosis without stenosis. Mild (Grade I) aortic regurgitation. Mild (Grade I) mitral regurgitation. Mild tricuspid regurgitation. No evidence of pulmonary hypertension. Small circumferential pericardial effusion. There is no hemodynamic significance. IVC is dilated with a respiratory response of >50%. No prior study for comparison.  Carotid artery duplex 09/07/2021:  Duplex suggests stenosis in the right internal carotid artery (1-15%). Duplex suggests stenosis in the right external carotid artery (<50%). Duplex suggests stenosis in  the left internal carotid artery (50-69%). Duplex suggests stenosis in the left external carotid artery (<50%). Antegrade right vertebral artery flow. Antegrade left vertebral artery flow. Follow up in six months is appropriate if clinically indicated.  PCV ECHOCARDIOGRAM COMPLETE 02/04/2022  Mildly depressed LV systolic function with visual EF 40-45%. Left ventricle cavity is normal in size. Moderate concentric remodeling of the left ventricle. Hypokinetic global wall motion. Calculated EF 42%. Trileaflet aortic valve.  Mild (Grade I) aortic regurgitation. Mild aortic valve leaflet calcification. Limited echo with patient discomfort and inability to lay down due to marked dyspnea. Only parasternal views.  Abdominal arteriogram and selective left renal arteriogram and lithotripsy balloon angioplasty of the suprarenal abdominal aorta using shockwave M5 catheter 01/24/2022: Abdominal aorta: 2 renal arteries 1 on either sides, mild atherosclerotic changes are present.  Abdominal aorta is diffusely diseased and calcific.  There is a high-grade 90% to 99% stenosis of the suprarenal abdominal aorta with heavy calcification. Intervention data: Successful shockwave lithotripsy using a 8.0 x 60 mm shockwave M5 balloon, gradual progression of balloon inflation pressure from 4 atmospheric pressure already up to 10 atm pressure and using a 8.43 mm lumen, followed by balloon angioplasty with a 4.0 x 60 mm Mustang balloon at 4 atmospheric pressure. Stenosis reduced from 95% to probably insignificant stenosis (<50%).  Angiogram not performed to conserve contrast and would have been poorly visualized with CO2.   Pressure gradient pre and post:  Opening right common femoral artery pressure 52/44 mmHg.  Suprarenal thoracic aortic pressure 180/58 mmHg with a mean of 103 mmHg.  Pressure gradient of approximately 130 mmHg.  Closing right femoral artery pressure 121/62 mmHg.  Pressure gradient approximately 30 mmHg post  angioplasty.    ABI 02/07/2022: Right: Resting right ankle-brachial index indicates moderate right lower extremity arterial disease.  ABI at PTA 0.66 and DP 0.65 with biphasic waveforms the right toe-brachial index is abnormal at 0.50.   Left: Resting left ankle-brachial index indicates moderate left lower extremity arterial disease.  ABI at PT 0.64 and DP 0.61 the left toe-brachial index is abnormal at 0.54.  PCV MYOCARDIAL PERFUSION Exercise nuclear stress test 03/28/2022: Normal ECG stress. The patient exercised for 2 minutes and 59 seconds of a Bruce protocol, achieving approximately 4.64 METs and achieved 88% MPHR. Stress terminated due to fatigue. The heart rate response was accelerated. Exercise capacity was severely reduced. The baseline blood pressure was 200/70 mmHg and increased to 230/76 mmHg, which is a normal response to exercise. Myocardial perfusion is normal. The LV is mildly dilated in both rest and stress. Overall LV systolic function is mildly abnormal without regional wall motion abnormalities. Stress LV EF: 43%. No previous exam available for comparison. Intermediate risk. Findings may suggest non ischemic cardiomyopathy.  EKG:   *** EKG 09/06/2021: Normal sinus rhythm at  rate of 81 bpm, left atrial enlargement, poor R wave progression, cannot exclude anteroseptal infarct old.  No evidence of ischemia.    Assessment     ICD-10-CM   1. Abdominal aortic stenosis  Q25.1     2. Claudication in peripheral vascular disease (HCC)  I73.9     3. Dyspnea on exertion  R06.09     4. Asymptomatic bilateral carotid artery stenosis  I65.23        There are no discontinued medications.   No orders of the defined types were placed in this encounter.   No orders of the defined types were placed in this encounter.  Recommendations:   ARAMIS ZOBEL is a 66 y.o. Caucasian female patient with longstanding history of hypertension, hyperlipidemia, tobacco use disorder,  staghorn renal calculus, seen in the emergency room for abdominal discomfort on 08/27/2021 and CT of the abdomen and reviewed severe abdominal aortic stenosis and renal artery stenosis along with mild colitis.  She is now referred to me for evaluation of vascular disease.  Physical examination reveals bilateral carotid bruit and very feeble pedal pulses and absent femoral pulses.  Fortunately there is no limb threatening ischemia, and in spite of severe aortic stenosis in the abdomen, she still has faint pedal pulses in the form of 1+ DP.  She has chronic dyspnea on exertion, probably related to underlying COPD and ongoing tobacco use disorder.  However underlying CAD needs to be excluded.  Smoking cessation was extensively discussed with the patient.  Obtain Lower extremity arterial duplex/ABI for evaluation of PAD and or claudication. Schedule for carotid duplex for bruit/follow-up surveillance of carotid stenosis. Schedule for a Lexiscan Nuclear stress test to evaluate for myocardial ischemia. Patient unable to do treadmill stress testing due to dyspnea. Will schedule for an echocardiogram.   She has resistant hypertension, suspect bilateral renal artery stenosis also contributing to this.  I will add isosorbide dinitrate 30 mg 3 times daily along with hydralazine 50 mg 3 times daily.  She may benefit from abdominal aortogram and evaluation of PAD at some point.    Adrian Prows, MD, [facility] 04/04/2022, 7:52 PM Office: [phone]

## 2022-04-05 ENCOUNTER — Ambulatory Visit: Payer: Medicare Other | Admitting: Cardiology

## 2022-04-05 DIAGNOSIS — I6523 Occlusion and stenosis of bilateral carotid arteries: Secondary | ICD-10-CM

## 2022-04-05 DIAGNOSIS — I739 Peripheral vascular disease, unspecified: Secondary | ICD-10-CM

## 2022-04-05 DIAGNOSIS — Q251 Coarctation of aorta: Secondary | ICD-10-CM

## 2022-04-05 DIAGNOSIS — R0609 Other forms of dyspnea: Secondary | ICD-10-CM

## 2022-05-12 ENCOUNTER — Encounter (HOSPITAL_COMMUNITY): Payer: Self-pay | Admitting: Emergency Medicine

## 2022-05-12 ENCOUNTER — Emergency Department (HOSPITAL_COMMUNITY)
Admission: EM | Admit: 2022-05-12 | Discharge: 2022-05-13 | Disposition: A | Discharge status: Home / Self Care | Payer: Medicare Other | Attending: Emergency Medicine | Admitting: Emergency Medicine

## 2022-05-12 ENCOUNTER — Other Ambulatory Visit: Payer: Self-pay

## 2022-05-12 DIAGNOSIS — I10 Essential (primary) hypertension: Secondary | ICD-10-CM | POA: Insufficient documentation

## 2022-05-12 DIAGNOSIS — D649 Anemia, unspecified: Secondary | ICD-10-CM | POA: Diagnosis not present

## 2022-05-12 DIAGNOSIS — Z79899 Other long term (current) drug therapy: Secondary | ICD-10-CM | POA: Diagnosis not present

## 2022-05-12 DIAGNOSIS — Z7902 Long term (current) use of antithrombotics/antiplatelets: Secondary | ICD-10-CM | POA: Insufficient documentation

## 2022-05-12 DIAGNOSIS — R040 Epistaxis: Secondary | ICD-10-CM | POA: Insufficient documentation

## 2022-05-12 DIAGNOSIS — J45909 Unspecified asthma, uncomplicated: Secondary | ICD-10-CM | POA: Insufficient documentation

## 2022-05-12 DIAGNOSIS — E039 Hypothyroidism, unspecified: Secondary | ICD-10-CM | POA: Insufficient documentation

## 2022-05-12 DIAGNOSIS — J449 Chronic obstructive pulmonary disease, unspecified: Secondary | ICD-10-CM | POA: Insufficient documentation

## 2022-05-12 DIAGNOSIS — Z7985 Long-term (current) use of injectable non-insulin antidiabetic drugs: Secondary | ICD-10-CM | POA: Insufficient documentation

## 2022-05-12 MED ORDER — SILVER NITRATE-POT NITRATE 75-25 % EX MISC
1.0000 | Freq: Once | CUTANEOUS | Status: AC
  Administered 2022-05-13: 1 via TOPICAL
  Filled 2022-05-12: qty 1

## 2022-05-12 MED ORDER — LIDOCAINE-EPINEPHRINE 1 %-1:100000 IJ SOLN
10.0000 mL | Freq: Once | INTRAMUSCULAR | Status: AC
  Administered 2022-05-13: 10 mL
  Filled 2022-05-12: qty 1

## 2022-05-12 MED ORDER — TRANEXAMIC ACID FOR EPISTAXIS
500.0000 mg | Freq: Once | TOPICAL | Status: AC
  Administered 2022-05-13: 500 mg via TOPICAL
  Filled 2022-05-12: qty 10

## 2022-05-12 NOTE — ED Triage Notes (Signed)
Pt reported to ED with c/o uncontrollable nose bleed for over one hour. Pt denies taking blood thinners, states she had a bleed earlier this morning and that was controlled. States EMS came this evening and provided "nasal spray" that did not slow bleeding.

## 2022-05-12 NOTE — ED Provider Notes (Signed)
Emergency Department Provider Note   I have reviewed the triage vital signs and the nursing notes.   HISTORY  Chief Complaint Epistaxis   HPI Gwendolyn [name] is a 66 y.o. female presents to the ED with epistaxis for the last several hours. Patient with no prior history of similar but notes that she was using a heater at work today and wonders if the air dried out her nose. She is not on anticoagulation. No injury. She called EMS and they gave afrin but bleeding continues.   Past Medical History:  Diagnosis Date   Anemia    Anxiety    Asthma    COPD (chronic obstructive pulmonary disease) (HCC)    Depression    GERD (gastroesophageal reflux disease)    History of kidney stones    Hypertension    Hypothyroidism     Review of Systems  Constitutional: No fever/chills Eyes: No visual changes. ENT: No sore throat. Positive epistaxis.  Cardiovascular: Denies chest pain. Respiratory: Denies shortness of breath. Gastrointestinal: No abdominal pain.  No nausea, no vomiting.  No diarrhea. Musculoskeletal: Negative for back pain. Skin: Negative for rash. Neurological: Negative for headaches.  ____________________________________________   PHYSICAL EXAM:  VITAL SIGNS: ED Triage Vitals  Enc Vitals Group     BP 05/12/22 2006 (!) 199/64     Pulse Rate 05/12/22 2006 84     Resp 05/12/22 2006 (!) 22     Temp 05/12/22 2036 98 F (36.7 C)     Temp Source 05/12/22 2036 Oral     SpO2 05/12/22 2006 98 %   Constitutional: Alert and oriented. Well appearing and in no acute distress. Eyes: Conjunctivae are normal.  Head: Atraumatic. Nose: Oozing blood from the right nostril.  Mouth/Throat: Mucous membranes are moist.  Oropharynx non-erythematous. Neck: No stridor.   Cardiovascular: Normal rate, regular rhythm. Good peripheral circulation. Grossly normal heart sounds.   Respiratory: Normal respiratory effort.   Gastrointestinal: No distention.  Musculoskeletal: No gross  deformities of extremities. Neurologic:  Normal speech and language.  Skin:  Skin is warm, dry and intact. No rash noted.   ____________________________________________   PROCEDURES  Procedure(s) performed:   .Epistaxis Management  Date/Time: 05/13/2022 12:05 AM  Performed by: Margette Fast, MD Authorized by: Margette Fast, MD   Consent:    Consent obtained:  Verbal   Consent given by:  Patient   Risks, benefits, and alternatives were discussed: yes     Risks discussed:  Bleeding Universal protocol:    Patient identity confirmed:  Verbally with patient Anesthesia:    Anesthesia method:  Topical application   Topical anesthetic:  Epinephrine and lidocaine gel Procedure details:    Treatment site:  R anterior   Treatment method:  Silver nitrate and nasal balloon   Treatment complexity:  Limited   Treatment episode: initial   Post-procedure details:    Assessment:  Bleeding stopped   Procedure completion:  Tolerated well, no immediate complications    ____________________________________________   INITIAL IMPRESSION / ASSESSMENT AND PLAN / ED COURSE  Pertinent labs & imaging results that were available during my care of the patient were reviewed by me and considered in my medical decision making (see chart for details).   This patient is Presenting for Evaluation of epistaxis, which does require a range of treatment options, and is a complaint that involves a high risk of morbidity and mortality.  The Differential Diagnoses include anterior nosebleed, posterior nosebleed, coagulation disorder, anemia.  Critical  Interventions-    Medications  lidocaine-EPINEPHrine (XYLOCAINE W/EPI) 1 %-1:100000 (with pres) injection 10 mL (10 mLs Infiltration Given by Other 05/13/22 0011)  silver nitrate applicators applicator 1 Application (1 Application Topical Given by Other 05/13/22 0012)  tranexamic acid (CYKLOKAPRON) 1000 MG/10ML topical solution 500 mg (500 mg Topical Given by  Other 05/13/22 0014)    Reassessment after intervention: Bleeding stopped.   Medical Decision Making: Summary:  Patient presents emergency department with epistaxis.  After interventions as above I was able to have the epistaxis stopped.  Gave contact information for the ENT on-call.  No abnormal vital signs or symptoms to suggest severe anemia requiring blood transfusion although labs considered.  Disposition: discharge  ____________________________________________  FINAL CLINICAL IMPRESSION(S) / ED DIAGNOSES  Final diagnoses:  Anterior epistaxis    Note:  This document was prepared using Dragon voice recognition software and may include unintentional dictation errors.  Nanda Quinton, MD, [facility] Emergency Medicine    Elleanor Guyett, [facility], MD 05/16/22 [phone]

## 2022-05-12 NOTE — ED Provider Triage Note (Signed)
Emergency Medicine Provider Triage Evaluation Note  Gwendolyn [name] , a 66 y.o. female  was evaluated in triage.  Pt complains of epistaxis.  Started having nosebleed 1 hour ago.  EMS gave her Afrin.  Continues to have bleeding.  No anticoagulant use.  Review of Systems  Positive: Epistaxis Negative: Trauma  Physical Exam  BP (!) 175/71   Pulse 91   Temp 98 F (36.7 C) (Oral)   Resp 16   LMP  (LMP Unknown)   SpO2 99%  Gen:   Awake, no distress   Resp:  Normal effort  MSK:   Moves extremities without difficulty  Other:  Currently having right-sided epistaxis  Medical Decision Making  Medically screening exam initiated at 8:42 PM.  Appropriate orders placed.  ABIA MONACO was informed that the remainder of the evaluation will be completed by another provider, this initial triage assessment does not replace that evaluation, and the importance of remaining in the ED until their evaluation is complete.  Informed patient that she should blow her nose and spray 2 more sprays of Afrin.  Informed her that she will need to hold pressure.  Informed nurse that she will need to be roomed soon due to ongoing bleeding.   Delia Heady, PA-C 05/12/22 2043

## 2022-05-13 ENCOUNTER — Emergency Department (HOSPITAL_COMMUNITY)
Admission: EM | Admit: 2022-05-13 | Discharge: 2022-05-13 | Disposition: A | Discharge status: Home / Self Care | Payer: Medicare Other | Source: Home / Self Care | Attending: Emergency Medicine | Admitting: Emergency Medicine

## 2022-05-13 ENCOUNTER — Encounter (HOSPITAL_COMMUNITY): Payer: Self-pay | Admitting: Emergency Medicine

## 2022-05-13 ENCOUNTER — Other Ambulatory Visit: Payer: Self-pay

## 2022-05-13 DIAGNOSIS — J449 Chronic obstructive pulmonary disease, unspecified: Secondary | ICD-10-CM | POA: Insufficient documentation

## 2022-05-13 DIAGNOSIS — D649 Anemia, unspecified: Secondary | ICD-10-CM | POA: Insufficient documentation

## 2022-05-13 DIAGNOSIS — J45909 Unspecified asthma, uncomplicated: Secondary | ICD-10-CM | POA: Insufficient documentation

## 2022-05-13 DIAGNOSIS — R040 Epistaxis: Secondary | ICD-10-CM | POA: Insufficient documentation

## 2022-05-13 DIAGNOSIS — Z7982 Long term (current) use of aspirin: Secondary | ICD-10-CM | POA: Insufficient documentation

## 2022-05-13 DIAGNOSIS — Z79899 Other long term (current) drug therapy: Secondary | ICD-10-CM | POA: Insufficient documentation

## 2022-05-13 DIAGNOSIS — I10 Essential (primary) hypertension: Secondary | ICD-10-CM | POA: Insufficient documentation

## 2022-05-13 DIAGNOSIS — Z7902 Long term (current) use of antithrombotics/antiplatelets: Secondary | ICD-10-CM | POA: Insufficient documentation

## 2022-05-13 LAB — CBC
HCT: 23.4 % — ABNORMAL LOW (ref 36.0–46.0)
Hemoglobin: 7.2 g/dL — ABNORMAL LOW (ref 12.0–15.0)
MCH: 22.6 pg — ABNORMAL LOW (ref 26.0–34.0)
MCHC: 30.8 g/dL (ref 30.0–36.0)
MCV: 73.4 fL — ABNORMAL LOW (ref 80.0–100.0)
Platelets: 277 10*3/uL (ref 150–400)
RBC: 3.19 MIL/uL — ABNORMAL LOW (ref 3.87–5.11)
RDW: 18.8 % — ABNORMAL HIGH (ref 11.5–15.5)
WBC: 9.4 10*3/uL (ref 4.0–10.5)
nRBC: 0 % (ref 0.0–0.2)

## 2022-05-13 MED ORDER — HYDRALAZINE HCL 25 MG PO TABS
50.0000 mg | ORAL_TABLET | Freq: Once | ORAL | Status: AC
  Administered 2022-05-13: 50 mg via ORAL
  Filled 2022-05-13: qty 2

## 2022-05-13 MED ORDER — CEPHALEXIN 500 MG PO CAPS: 500.0000 mg | ORAL_CAPSULE | Freq: Four times a day (QID) | ORAL | 0 refills | Status: AC

## 2022-05-13 NOTE — Discharge Instructions (Signed)

## 2022-05-13 NOTE — ED Notes (Signed)
Patient verbalizes understanding of d/c instructions. Opportunities for questions and answers were provided. Pt d/c from ED and ambulated to lobby where son is picking her up.

## 2022-05-13 NOTE — ED Triage Notes (Signed)
Pt returned to ED for re-assessment of nosebleed. Pt states that nose began bleeding as soon as she was discharged from this facility. Nose does not appear to be bleeding at time of triage, but pt noted to continually put tissue at nare to "apply pressure".

## 2022-05-13 NOTE — ED Provider Notes (Addendum)
[facility] EMERGENCY DEPARTMENT Provider Note   CSN: [id number] Arrival date & time: 05/13/22  8250     History  Chief Complaint  Patient presents with   Epistaxis    Gwendolyn [name] is a 66 y.o. female.   Epistaxis   Patient has a history of hypertension, asthma, COPD, GERD, depression, chronic Bacot use who presents to the ED with epistaxis.  Patient started having a nosebleed last evening.  Patient noticed bleeding coming from the right nares.  The bleeding persisted for a couple of hours.  She ended up calling EMS.  They gave her an Afrin spray but her bleeding persisted.  She was evaluated in the ED and was treated with tranexamic acid, and silver nitrate application.  Patient states they attempted nasal packing but she sneezed it out immediately.  Bleeding stopped when she left but patient states when she went home the bleeding returned again.  The bleeding has stopped at this point but she is concerned that she could be anemic as she has a history of this and duration of the bleeding concerns her.  Home Medications Prior to Admission medications   Medication Sig Start Date End Date Taking? Authorizing Provider  cephALEXin (KEFLEX) 500 MG capsule Take 1 capsule (500 mg total) by mouth 4 (four) times daily for 5 days. 05/13/22 05/18/22 Yes Dorie Rank, MD  amLODipine (NORVASC) 5 MG tablet Take 10 mg by mouth daily. 06/12/21   [provider]  aspirin EC 81 MG EC tablet Take 1 tablet (81 mg total) by mouth daily. Swallow whole. 02/07/22   Domenic Polite, MD  atorvastatin (LIPITOR) 80 MG tablet Take 80 mg by mouth daily. 09/01/21   [provider]  Budeson-Glycopyrrol-Formoterol (BREZTRI AEROSPHERE) 160-9-4.8 MCG/ACT AERO Inhale 2 puffs into the lungs in the morning and at bedtime.    [provider]  clopidogrel (PLAVIX) 75 MG tablet Take 1 tablet (75 mg total) by mouth daily with breakfast. 02/08/22   Domenic Polite, MD  furosemide (LASIX)  40 MG tablet Take 0.5 tablets (20 mg total) by mouth daily. 02/07/22   Domenic Polite, MD  hydrALAZINE (APRESOLINE) 50 MG tablet TAKE 1 TABLET(50 MG) BY MOUTH THREE TIMES DAILY Patient taking differently: Take 50 mg by mouth 3 (three) times daily. 01/31/22   Cantwell, Celeste C, PA-C  Iron-FA-B Cmp-C-Biot-Probiotic (FUSION PLUS) CAPS Take 1 capsule by mouth every other day.    [provider]  isosorbide dinitrate (ISORDIL) 30 MG tablet Take 1 tablet (30 mg total) by mouth 3 (three) times daily. 02/07/22   Domenic Polite, MD  labetalol (NORMODYNE) 200 MG tablet Take 1 tablet (200 mg total) by mouth 2 (two) times daily. 300 MG IN THE AM 200 MG IN THE PM 02/07/22   Domenic Polite, MD  levothyroxine (SYNTHROID) 137 MCG tablet Take 137 mcg by mouth daily before breakfast.    [provider]  potassium chloride (KLOR-CON) 10 MEQ tablet Take 1 tablet (10 mEq total) by mouth daily as needed. With Furosemide only 01/30/22 04/30/22  Adrian Prows, MD      Allergies    Doxycycline, Azilsartan, Biaxin [clarithromycin], Floxacillin [floxacillin (flucloxacillin)], and Morphine and related    Review of Systems   Review of Systems  HENT:  Positive for nosebleeds.     Physical Exam Updated Vital Signs BP (!) 177/59   Pulse 70   Temp 97.8 F (36.6 C)   Resp 16   LMP  (LMP Unknown)   SpO2 98%  Physical Exam Vitals and nursing note reviewed.  Constitutional:      General: She is not in acute distress.    Appearance: She is well-developed.  HENT:     Head: Normocephalic and atraumatic.     Right Ear: External ear normal.     Left Ear: External ear normal.     Nose:     Comments: Status post silver nitrate application right nares, no active bleeding noted, no blood in the oropharynx Eyes:     General: No scleral icterus.       Right eye: No discharge.        Left eye: No discharge.     Conjunctiva/sclera: Conjunctivae normal.  Neck:     Trachea: No tracheal deviation.   Cardiovascular:     Rate and Rhythm: Normal rate.  Pulmonary:     Effort: Pulmonary effort is normal. No respiratory distress.     Breath sounds: Normal breath sounds. No stridor.  Abdominal:     General: There is no distension.  Musculoskeletal:        General: No swelling or deformity.     Cervical back: Neck supple.  Skin:    General: Skin is warm and dry.     Findings: No rash.  Neurological:     Mental Status: She is alert.     Cranial Nerves: Cranial nerve deficit: no gross deficits.     ED Results / Procedures / Treatments   Labs (all labs ordered are listed, but only abnormal results are displayed) Labs Reviewed  CBC - Abnormal; Notable for the following components:      Result Value   RBC 3.19 (*)    Hemoglobin 7.2 (*)    HCT 23.4 (*)    MCV 73.4 (*)    MCH 22.6 (*)    RDW 18.8 (*)    All other components within normal limits    EKG None  Radiology No results found.  Procedures .Epistaxis Management  Date/Time: 05/13/2022 11:36 AM  Performed by: Dorie Rank, MD Authorized by: Dorie Rank, MD   Consent:    Consent obtained:  Verbal   Consent given by:  Patient Anesthesia:    Anesthesia method:  Topical application   Topical anesthetic:  Benzocaine gel Procedure details:    Treatment site: Right side.   Repair method: 7.5 cm Rhinostat.   Treatment complexity:  Limited Post-procedure details:    Procedure completion:  Tolerated well, no immediate complications     Medications Ordered in ED Medications - No data to display  ED Course/ Medical Decision Making/ A&P Clinical Course as of 05/13/22 1134  Fri May 13, 2022  1002 CBC(!) Hemoglobin dropped 2 points since previous [JK]  1037 Discussed with Dr Redmond Baseman, would recommend placing nasal packing with her drop in hemoglobin, will follow up in the office, hold plavix [JK]  1134 Nasal packing placed.  No recurrent bleeding. [JK]    Clinical Course User Index [JK] Dorie Rank, MD                            Medical Decision Making Problems Addressed: Anemia, unspecified type: chronic illness or injury with exacerbation, progression, or side effects of treatment Hypertension, unspecified type: chronic illness or injury with exacerbation, progression, or side effects of treatment  Amount and/or Complexity of Data Reviewed Labs: ordered. Decision-making details documented in ED Course.  Risk Prescription drug management.   Patient presented to the  ED for recurrent nosebleed.  By the time patient was seen the bleeding had resolved again.  She was concerned however because of the recurrent nature of her bleeding she is already anemic at baseline.  Hemoglobin was checked and it is down in the 7 range.  Patient has had a significant drop in her hemoglobin.  She is on iron supplements and is scheduled to have an iron infusion.  No signs of active bleeding at this time so I do not feel that she requires a blood transfusion or admission to the hospital.  I discussed case with Dr. Redmond Baseman and he recommended nasal packing as she certainly is right on the border requiring transfusion.  Patient agreed and nasal packing was placed without difficulty.  No recurrent bleeding.  Patient will continue to hold her aspirin and Plavix which she has already done.  Close outpatient follow-up with ENT.  Warning signs precautions discussed.  Evaluation and diagnostic testing in the emergency department does not suggest an emergent condition requiring admission or immediate intervention beyond what has been performed at this time.  The patient is safe for discharge and has been instructed to return immediately for worsening symptoms, change in symptoms or any other concerns.   Additional dose of hydralazine given for the patient's hypertension while she was in the ED    Final Clinical Impression(s) / ED Diagnoses Final diagnoses:  Epistaxis  Anemia, unspecified type    Rx / DC Orders ED Discharge Orders           Ordered    cephALEXin (KEFLEX) 500 MG capsule  4 times daily        05/13/22 1133              Dorie Rank, MD 05/13/22 1138    Dorie Rank, MD 05/13/22 1140

## 2022-05-13 NOTE — ED Notes (Signed)
Pt verbalized understanding of discharge instructions. Opportunity for questions provided.  

## 2022-05-15 ENCOUNTER — Emergency Department (HOSPITAL_COMMUNITY)
Admission: EM | Admit: 2022-05-15 | Discharge: 2022-05-15 | Disposition: A | Discharge status: Home / Self Care | Payer: Medicare Other | Attending: Emergency Medicine | Admitting: Emergency Medicine

## 2022-05-15 ENCOUNTER — Other Ambulatory Visit: Payer: Self-pay

## 2022-05-15 ENCOUNTER — Encounter (HOSPITAL_COMMUNITY): Payer: Self-pay

## 2022-05-15 DIAGNOSIS — J449 Chronic obstructive pulmonary disease, unspecified: Secondary | ICD-10-CM | POA: Diagnosis not present

## 2022-05-15 DIAGNOSIS — J45909 Unspecified asthma, uncomplicated: Secondary | ICD-10-CM | POA: Diagnosis not present

## 2022-05-15 DIAGNOSIS — E039 Hypothyroidism, unspecified: Secondary | ICD-10-CM | POA: Insufficient documentation

## 2022-05-15 DIAGNOSIS — I10 Essential (primary) hypertension: Secondary | ICD-10-CM | POA: Diagnosis not present

## 2022-05-15 DIAGNOSIS — R040 Epistaxis: Secondary | ICD-10-CM | POA: Insufficient documentation

## 2022-05-15 DIAGNOSIS — Z7982 Long term (current) use of aspirin: Secondary | ICD-10-CM | POA: Insufficient documentation

## 2022-05-15 DIAGNOSIS — D649 Anemia, unspecified: Secondary | ICD-10-CM | POA: Insufficient documentation

## 2022-05-15 DIAGNOSIS — Z7902 Long term (current) use of antithrombotics/antiplatelets: Secondary | ICD-10-CM | POA: Diagnosis not present

## 2022-05-15 DIAGNOSIS — Z79899 Other long term (current) drug therapy: Secondary | ICD-10-CM | POA: Insufficient documentation

## 2022-05-15 LAB — BASIC METABOLIC PANEL
Anion gap: 10 (ref 5–15)
BUN: 9 mg/dL (ref 8–23)
CO2: 22 mmol/L (ref 22–32)
Calcium: 8.8 mg/dL — ABNORMAL LOW (ref 8.9–10.3)
Chloride: 101 mmol/L (ref 98–111)
Creatinine, Ser: 0.79 mg/dL (ref 0.44–1.00)
GFR, Estimated: >60 mL/min (ref >60)
Glucose, Bld: 122 mg/dL — ABNORMAL HIGH (ref 70–99)
Potassium: 3.3 mmol/L — ABNORMAL LOW (ref 3.5–5.1)
Sodium: 133 mmol/L — ABNORMAL LOW (ref 135–145)

## 2022-05-15 LAB — CBC WITH DIFFERENTIAL/PLATELET
Abs Immature Granulocytes: 0.04 10*3/uL (ref 0.00–0.07)
Basophils Absolute: 0.1 10*3/uL (ref 0.0–0.1)
Basophils Relative: 1 %
Eosinophils Absolute: 0.1 10*3/uL (ref 0.0–0.5)
Eosinophils Relative: 1 %
HCT: 22.8 % — ABNORMAL LOW (ref 36.0–46.0)
Hemoglobin: 7 g/dL — ABNORMAL LOW (ref 12.0–15.0)
Immature Granulocytes: 1 %
Lymphocytes Relative: 25 %
Lymphs Abs: 2 10*3/uL (ref 0.7–4.0)
MCH: 22.7 pg — ABNORMAL LOW (ref 26.0–34.0)
MCHC: 30.7 g/dL (ref 30.0–36.0)
MCV: 73.8 fL — ABNORMAL LOW (ref 80.0–100.0)
Monocytes Absolute: 0.7 10*3/uL (ref 0.1–1.0)
Monocytes Relative: 9 %
Neutro Abs: 5 10*3/uL (ref 1.7–7.7)
Neutrophils Relative %: 63 %
Platelets: 302 10*3/uL (ref 150–400)
RBC: 3.09 MIL/uL — ABNORMAL LOW (ref 3.87–5.11)
RDW: 19.2 % — ABNORMAL HIGH (ref 11.5–15.5)
WBC: 7.9 10*3/uL (ref 4.0–10.5)
nRBC: 0 % (ref 0.0–0.2)

## 2022-05-15 MED ORDER — LIDOCAINE-EPINEPHRINE (PF) 2 %-1:200000 IJ SOLN
20.0000 mL | Freq: Once | INTRAMUSCULAR | Status: AC
  Administered 2022-05-15: 20 mL
  Filled 2022-05-15: qty 20

## 2022-05-15 MED ORDER — TRANEXAMIC ACID FOR EPISTAXIS
500.0000 mg | Freq: Once | TOPICAL | Status: AC
  Administered 2022-05-15: 500 mg via TOPICAL
  Filled 2022-05-15: qty 10

## 2022-05-15 MED ORDER — OXYMETAZOLINE HCL 0.05 % NA SOLN
1.0000 | Freq: Once | NASAL | Status: AC
  Administered 2022-05-15: 1 via NASAL
  Filled 2022-05-15: qty 30

## 2022-05-15 NOTE — ED Triage Notes (Signed)
Patient complains of recurrent nosebleed from right nare after having rhino rocket placed on Friday. States that she can feel the blood going down the back of her throat

## 2022-05-15 NOTE — Discharge Instructions (Signed)
Please follow-up with ENT on Tuesday. Return for any recurrence of bleeding, symptoms of anemia such as lightheadedness, fatigue, shortness of breath.

## 2022-05-15 NOTE — ED Notes (Signed)
Called lab to add on orders to blood received.

## 2022-05-15 NOTE — ED Provider Notes (Signed)
[facility] EMERGENCY DEPARTMENT Provider Note   CSN: [id number] Arrival date & time: 05/15/22  0932     History  No chief complaint on file.   Gwendolyn [name] is a 66 y.o. female.  HPI  66 year old female with medical history significant for hypertension, asthma, COPD, GERD, hypothyroidism, anxiety, depression, anemia, recurrent nosebleeds who presents to the emergency department with some bleeding around her right nare.  She was seen in the emergency department for nosebleed on 7/6 and 05/13/2022 for epistaxis.  She states she is she has an upcoming appointment with ENT next Tuesday.  She has been having some bleeding around her nasal packing in the right nare and ENT told her to come to the emergency department for potential exchange of the packing.  She denies any difficulty breathing or swallowing.  Bleeding has stopped at this point.  She was on Plavix but states that she had to stop taking the medication due to recurrent nosebleeds.  Home Medications Prior to Admission medications   Medication Sig Start Date End Date Taking? Authorizing Provider  amLODipine (NORVASC) 5 MG tablet Take 10 mg by mouth daily. 06/12/21   [provider]  aspirin EC 81 MG EC tablet Take 1 tablet (81 mg total) by mouth daily. Swallow whole. 02/07/22   Domenic Polite, MD  atorvastatin (LIPITOR) 80 MG tablet Take 80 mg by mouth daily. 09/01/21   [provider]  Budeson-Glycopyrrol-Formoterol (BREZTRI AEROSPHERE) 160-9-4.8 MCG/ACT AERO Inhale 2 puffs into the lungs in the morning and at bedtime.    [provider]  cephALEXin (KEFLEX) 500 MG capsule Take 1 capsule (500 mg total) by mouth 4 (four) times daily for 5 days. 05/13/22 05/18/22  Dorie Rank, MD  clopidogrel (PLAVIX) 75 MG tablet Take 1 tablet (75 mg total) by mouth daily with breakfast. 02/08/22   Domenic Polite, MD  furosemide (LASIX) 40 MG tablet Take 0.5 tablets (20 mg total) by mouth daily. 02/07/22   Domenic Polite, MD  hydrALAZINE (APRESOLINE) 50 MG tablet TAKE 1 TABLET(50 MG) BY MOUTH THREE TIMES DAILY Patient taking differently: Take 50 mg by mouth 3 (three) times daily. 01/31/22   Cantwell, Celeste C, PA-C  Iron-FA-B Cmp-C-Biot-Probiotic (FUSION PLUS) CAPS Take 1 capsule by mouth every other day.    [provider]  isosorbide dinitrate (ISORDIL) 30 MG tablet Take 1 tablet (30 mg total) by mouth 3 (three) times daily. 02/07/22   Domenic Polite, MD  labetalol (NORMODYNE) 200 MG tablet Take 1 tablet (200 mg total) by mouth 2 (two) times daily. 300 MG IN THE AM 200 MG IN THE PM 02/07/22   Domenic Polite, MD  levothyroxine (SYNTHROID) 137 MCG tablet Take 137 mcg by mouth daily before breakfast.    [provider]  potassium chloride (KLOR-CON) 10 MEQ tablet Take 1 tablet (10 mEq total) by mouth daily as needed. With Furosemide only 01/30/22 04/30/22  Adrian Prows, MD      Allergies    Doxycycline, Azilsartan, Biaxin [clarithromycin], Floxacillin [floxacillin (flucloxacillin)], and Morphine and related    Review of Systems   Review of Systems  HENT:  Positive for nosebleeds.   All other systems reviewed and are negative.   Physical Exam Updated Vital Signs BP (!) 155/108   Pulse 79   Temp 97.6 F (36.4 C)   Resp 14   LMP  (LMP Unknown)   SpO2 100%  Physical Exam Vitals and nursing note reviewed.  Constitutional:      General:  She is not in acute distress. HENT:     Head: Normocephalic and atraumatic.     Comments: Right anterior nasal packing in place, no active bleeding noted Eyes:     Conjunctiva/sclera: Conjunctivae normal.     Pupils: Pupils are equal, round, and reactive to light.  Cardiovascular:     Rate and Rhythm: Normal rate and regular rhythm.  Pulmonary:     Effort: Pulmonary effort is normal. No respiratory distress.  Abdominal:     General: There is no distension.     Tenderness: There is no guarding.  Musculoskeletal:        General: No deformity or  signs of injury.     Cervical back: Neck supple.  Skin:    Findings: No lesion or rash.  Neurological:     General: No focal deficit present.     Mental Status: She is alert. Mental status is at baseline.     ED Results / Procedures / Treatments   Labs (all labs ordered are listed, but only abnormal results are displayed) Labs Reviewed  CBC WITH DIFFERENTIAL/PLATELET - Abnormal; Notable for the following components:      Result Value   RBC 3.09 (*)    Hemoglobin 7.0 (*)    HCT 22.8 (*)    MCV 73.8 (*)    MCH 22.7 (*)    RDW 19.2 (*)    All other components within normal limits  BASIC METABOLIC PANEL - Abnormal; Notable for the following components:   Sodium 133 (*)    Potassium 3.3 (*)    Glucose, Bld 122 (*)    Calcium 8.8 (*)    All other components within normal limits    EKG None  Radiology No results found.  Procedures .Epistaxis Management  Date/Time: 05/15/2022 1:10 PM  Performed by: Regan Lemming, MD Authorized by: Regan Lemming, MD       Medications Ordered in ED Medications  tranexamic acid (CYKLOKAPRON) 1000 MG/10ML topical solution 500 mg (500 mg Topical Given 05/15/22 1301)  lidocaine-EPINEPHrine (XYLOCAINE W/EPI) 2 %-1:200000 (PF) injection 20 mL (20 mLs Other Given 05/15/22 1301)  oxymetazoline (AFRIN) 0.05 % nasal spray 1 spray (1 spray Each Nare Given 05/15/22 1301)    ED Course/ Medical Decision Making/ A&P                           Medical Decision Making Amount and/or Complexity of Data Reviewed Labs: ordered.  Risk OTC drugs. Prescription drug management.   66 year old female with medical history significant for hypertension, asthma, COPD, GERD, hypothyroidism, anxiety, depression, anemia, recurrent nosebleeds who presents to the emergency department with some bleeding around her right nare.  She was seen in the emergency department for nosebleed on 7/6 and 05/13/2022 for epistaxis.  She states she is she has an upcoming appointment with  ENT next Tuesday.  She has been having some bleeding around her nasal packing in the right nare and ENT told her to come to the emergency department for potential exchange of the packing.  She denies any difficulty breathing or swallowing.  Bleeding has stopped at this point.  She is on Plavix.  On arrival, the patient was vitally stable, mildly hypertensive BP 149/51.  The patient's nasal packing was removed.  She blew a large clot from her right nare and a mixture of Afrin, TXA and lidocaine with epinephrine was nebulized into the nostril.  Packing via [employer] was readministered with subsequent  hemostasis with no further bleeding noted.  The patient had no bleeding in her oropharynx.  She was observed for roughly an hour status post nasal packing with no recurrence of her bleed.  CBC was checked and revealed a hemoglobin of 7.0, slight decrease from 7.2 a few days ago.  The patient denies any symptoms of anemia at this time.  She is quite pale.  I do not think the patient requires an emergent blood transfusion at this time but advised that the patient follow back up in the event of development of symptomatic anemia.  Also advised that she follow up with ENT on Tuesday for outpatient assessment.  Return precautions provided in the event of worsening bleed.   Final Clinical Impression(s) / ED Diagnoses Final diagnoses:  Epistaxis  Anemia, unspecified type    Rx / DC Orders ED Discharge Orders     None         Regan Lemming, MD 05/15/22 1426

## 2022-05-15 NOTE — ED Notes (Signed)
MD at bedside. 

## 2022-05-16 ENCOUNTER — Encounter (HOSPITAL_COMMUNITY): Payer: Self-pay

## 2022-05-16 ENCOUNTER — Other Ambulatory Visit: Payer: Self-pay

## 2022-05-16 ENCOUNTER — Emergency Department (HOSPITAL_COMMUNITY)
Admission: EM | Admit: 2022-05-16 | Discharge: 2022-05-16 | Disposition: A | Discharge status: Home / Self Care | Payer: Medicare Other | Attending: Emergency Medicine | Admitting: Emergency Medicine

## 2022-05-16 DIAGNOSIS — Z7982 Long term (current) use of aspirin: Secondary | ICD-10-CM | POA: Diagnosis not present

## 2022-05-16 DIAGNOSIS — F17210 Nicotine dependence, cigarettes, uncomplicated: Secondary | ICD-10-CM | POA: Insufficient documentation

## 2022-05-16 DIAGNOSIS — E039 Hypothyroidism, unspecified: Secondary | ICD-10-CM | POA: Insufficient documentation

## 2022-05-16 DIAGNOSIS — Z7951 Long term (current) use of inhaled steroids: Secondary | ICD-10-CM | POA: Diagnosis not present

## 2022-05-16 DIAGNOSIS — I110 Hypertensive heart disease with heart failure: Secondary | ICD-10-CM | POA: Insufficient documentation

## 2022-05-16 DIAGNOSIS — Z7902 Long term (current) use of antithrombotics/antiplatelets: Secondary | ICD-10-CM | POA: Insufficient documentation

## 2022-05-16 DIAGNOSIS — J449 Chronic obstructive pulmonary disease, unspecified: Secondary | ICD-10-CM | POA: Insufficient documentation

## 2022-05-16 DIAGNOSIS — I509 Heart failure, unspecified: Secondary | ICD-10-CM | POA: Insufficient documentation

## 2022-05-16 DIAGNOSIS — R42 Dizziness and giddiness: Secondary | ICD-10-CM | POA: Insufficient documentation

## 2022-05-16 DIAGNOSIS — R040 Epistaxis: Secondary | ICD-10-CM | POA: Diagnosis not present

## 2022-05-16 DIAGNOSIS — Z79899 Other long term (current) drug therapy: Secondary | ICD-10-CM | POA: Diagnosis not present

## 2022-05-16 DIAGNOSIS — J45909 Unspecified asthma, uncomplicated: Secondary | ICD-10-CM | POA: Diagnosis not present

## 2022-05-16 DIAGNOSIS — D649 Anemia, unspecified: Secondary | ICD-10-CM | POA: Diagnosis not present

## 2022-05-16 LAB — PROTIME-INR
INR: 1.1 (ref 0.8–1.2)
Prothrombin Time: 13.7 seconds (ref 11.4–15.2)

## 2022-05-16 LAB — CBC
HCT: 23 % — ABNORMAL LOW (ref 36.0–46.0)
Hemoglobin: 6.9 g/dL — CL (ref 12.0–15.0)
MCH: 22.6 pg — ABNORMAL LOW (ref 26.0–34.0)
MCHC: 30 g/dL (ref 30.0–36.0)
MCV: 75.4 fL — ABNORMAL LOW (ref 80.0–100.0)
Platelets: 308 10*3/uL (ref 150–400)
RBC: 3.05 MIL/uL — ABNORMAL LOW (ref 3.87–5.11)
RDW: 19.5 % — ABNORMAL HIGH (ref 11.5–15.5)
WBC: 7 10*3/uL (ref 4.0–10.5)
nRBC: 0 % (ref 0.0–0.2)

## 2022-05-16 LAB — TYPE AND SCREEN
ABO/RH(D): O POS
Antibody Screen: NEGATIVE

## 2022-05-16 LAB — ABO/RH: ABO/RH(D): O POS

## 2022-05-16 MED ORDER — OXYMETAZOLINE HCL 0.05 % NA SOLN
1.0000 | Freq: Once | NASAL | Status: AC
  Administered 2022-05-16: 1 via NASAL
  Filled 2022-05-16 (×2): qty 30

## 2022-05-16 MED ORDER — CLONIDINE HCL 0.1 MG PO TABS: 0.1000 mg | ORAL_TABLET | Freq: Once | ORAL | Status: DC

## 2022-05-16 MED ORDER — CLONIDINE HCL 0.1 MG PO TABS
0.1000 mg | ORAL_TABLET | Freq: Once | ORAL | Status: AC
Start: 2022-05-16 — End: 2022-05-16
  Administered 2022-05-16: 0.1 mg via ORAL
  Filled 2022-05-16: qty 1

## 2022-05-16 NOTE — ED Notes (Signed)
Pt ambulated to the restroom without assistance

## 2022-05-16 NOTE — Discharge Instructions (Signed)
Please follow up with ENT tomorrow afternoon. Please return if symptoms come back.

## 2022-05-16 NOTE — ED Provider Notes (Signed)
[facility] EMERGENCY DEPARTMENT Provider Note   CSN: [id number] Arrival date & time: 05/16/22  1031     History  Chief Complaint  Patient presents with   Epistaxis    MOZETTA MURFIN is a 66 y.o. female with medical history significant for hypertension, asthma, COPD, CHF, GERD, hypothyroidism, anemia, recurrent nosebleeds who presents to the emergency department with bleeding from her right nare.  Patient has been seen several times in the emergency department over the last week for similar complaints including on 7/6, 7/7 and on 7/9.  She was discharged home yesterday with nasal packing in the right nare.  Today, when she was at work, she began to bleed around the packing once more.  Bleeding seems to have resolved by the time of my evaluation.  Patient is not on blood thinners and discontinue Plavix several months ago due to her recurrent nosebleeds.  She has an appointment with ENT with Dr. Wilburn Cornelia scheduled for tomorrow, and she called in this morning with the recurrent bleeding and they advised her to come to the emergency department for evaluation.  Of note, patient's hemoglobin has been steadily decreasing from 7.2 down to 7 and down to 6.9 today.  She does endorse dizziness and some weakness although this has been baseline for her since her nosebleed started several months ago.  She takes iron daily to help with her anemia and is scheduled for an iron infusion.  She denies fevers, chills, headache, chest pain, shortness of breath, abdominal pain, nausea, vomiting and diarrhea.   Epistaxis Associated symptoms: dizziness   Associated symptoms: no fever        Home Medications Prior to Admission medications   Medication Sig Start Date End Date Taking? Authorizing Provider  amLODipine (NORVASC) 5 MG tablet Take 10 mg by mouth daily. 06/12/21   [provider]  aspirin EC 81 MG EC tablet Take 1 tablet (81 mg total) by mouth daily. Swallow whole. 02/07/22    Domenic Polite, MD  atorvastatin (LIPITOR) 80 MG tablet Take 80 mg by mouth daily. 09/01/21   [provider]  Budeson-Glycopyrrol-Formoterol (BREZTRI AEROSPHERE) 160-9-4.8 MCG/ACT AERO Inhale 2 puffs into the lungs in the morning and at bedtime.    [provider]  cephALEXin (KEFLEX) 500 MG capsule Take 1 capsule (500 mg total) by mouth 4 (four) times daily for 5 days. 05/13/22 05/18/22  Dorie Rank, MD  clopidogrel (PLAVIX) 75 MG tablet Take 1 tablet (75 mg total) by mouth daily with breakfast. 02/08/22   Domenic Polite, MD  furosemide (LASIX) 40 MG tablet Take 0.5 tablets (20 mg total) by mouth daily. 02/07/22   Domenic Polite, MD  hydrALAZINE (APRESOLINE) 50 MG tablet TAKE 1 TABLET(50 MG) BY MOUTH THREE TIMES DAILY Patient taking differently: Take 50 mg by mouth 3 (three) times daily. 01/31/22   Cantwell, Celeste C, PA-C  Iron-FA-B Cmp-C-Biot-Probiotic (FUSION PLUS) CAPS Take 1 capsule by mouth every other day.    [provider]  isosorbide dinitrate (ISORDIL) 30 MG tablet Take 1 tablet (30 mg total) by mouth 3 (three) times daily. 02/07/22   Domenic Polite, MD  labetalol (NORMODYNE) 200 MG tablet Take 1 tablet (200 mg total) by mouth 2 (two) times daily. 300 MG IN THE AM 200 MG IN THE PM 02/07/22   Domenic Polite, MD  levothyroxine (SYNTHROID) 137 MCG tablet Take 137 mcg by mouth daily before breakfast.    [provider]  potassium chloride (KLOR-CON) 10 MEQ tablet Take 1  tablet (10 mEq total) by mouth daily as needed. With Furosemide only 01/30/22 04/30/22  Adrian Prows, MD      Allergies    Doxycycline, Azilsartan, Biaxin [clarithromycin], Floxacillin [floxacillin (flucloxacillin)], and Morphine and related    Review of Systems   Review of Systems  Constitutional:  Negative for fever.  HENT:  Positive for nosebleeds.   Respiratory:  Negative for shortness of breath.   Gastrointestinal:  Negative for abdominal pain, diarrhea and vomiting.  Neurological:   Positive for dizziness and weakness.    Physical Exam Updated Vital Signs BP (!) 166/58   Pulse 77   Temp 97.8 F (36.6 C) (Oral)   Resp 18   Ht 4' 11.5" (1.511 m)   Wt 63 kg   LMP  (LMP Unknown)   SpO2 100%   BMI 27.60 kg/m  Physical Exam Vitals and nursing note reviewed.  Constitutional:      General: She is not in acute distress.    Appearance: She is not ill-appearing.  HENT:     Head: Atraumatic.     Nose:     Comments: Right nasal packing in place, no active bleeding noted. Eyes:     Conjunctiva/sclera: Conjunctivae normal.  Cardiovascular:     Rate and Rhythm: Normal rate and regular rhythm.     Pulses: Normal pulses.     Heart sounds: No murmur heard. Pulmonary:     Effort: Pulmonary effort is normal. No respiratory distress.     Breath sounds: Normal breath sounds.  Abdominal:     General: Abdomen is flat. There is no distension.     Palpations: Abdomen is soft.     Tenderness: There is no abdominal tenderness.  Musculoskeletal:        General: Normal range of motion.     Cervical back: Normal range of motion.  Skin:    General: Skin is warm and dry.     Capillary Refill: Capillary refill takes less than 2 seconds.  Neurological:     General: No focal deficit present.     Mental Status: She is alert.  Psychiatric:        Mood and Affect: Mood normal.     ED Results / Procedures / Treatments   Labs (all labs ordered are listed, but only abnormal results are displayed) Labs Reviewed  CBC - Abnormal; Notable for the following components:      Result Value   RBC 3.05 (*)    Hemoglobin 6.9 (*)    HCT 23.0 (*)    MCV 75.4 (*)    MCH 22.6 (*)    RDW 19.5 (*)    All other components within normal limits  PROTIME-INR  TYPE AND SCREEN  ABO/RH    EKG None  Radiology No results found.  Procedures Procedures    Medications Ordered in ED Medications  cloNIDine (CATAPRES) tablet 0.1 mg (has no administration in time range)  oxymetazoline  (AFRIN) 0.05 % nasal spray 1 spray (1 spray Each Nare Given 05/16/22 1359)    ED Course/ Medical Decision Making/ A&P Clinical Course as of 05/16/22 1555  Mon May 16, 2022  1530 Epistaxis. 4th ED visit since July 7th. 2nd pack placed yesterday. Bleeding restarted yesterday. Treat BP. Consult Dr. Redmond Baseman for recommendations.  [GL]    Clinical Course User Index [GL] Loeffler, Adora Fridge, PA-C  Medical Decision Making Risk Prescription drug management.   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    Comment: full time  Tobacco Use   Smoking status: Every Day    Packs/day: 1.00    Years: 40.00    Total pack years: 40.00    Types: Cigarettes   Smokeless tobacco: Never  Vaping Use   Vaping Use: Never used  Substance and Sexual Activity   Alcohol use: Never   Drug use: Never   Sexual activity: Not on file    Comment: hysterectomy  Other Topics Concern   Not on file  Social History Narrative   Not on file   Social Determinants of Health   Financial Resource Strain: Low Risk  (02/02/2022)   Overall Financial Resource Strain (CARDIA)    Difficulty of Paying Living Expenses: Not hard at all  Food Insecurity: No Food Insecurity (02/02/2022)   Hunger Vital Sign    Worried About Running Out of Food in the Last Year: Never true    [place] in the Last Year: Never true  Transportation Needs: No Transportation Needs (02/02/2022)   PRAPARE - Hydrologist (Medical): No    Lack of Transportation (Non-Medical): No  Physical Activity: Not on file  Stress: Not on file  Social Connections: Not on file  Intimate Partner Violence: Not on file     Initial impression:  This patient  presents to the ED for concern of epistaxis, this involves an extensive number of treatment options, and is a complaint that carries with it a high risk of complications and morbidity.    Comorbidities affecting care:  Per HPI  Additional history obtained: Discharge summaries from the last several days  Lab Tests  I Ordered, reviewed, and interpreted labs and EKG.  The pertinent results include:  Hemoglobin 6.9   Consultations Obtained:  I requested consultation with ENT and spoke with Dr. Marcelline Deist,  and discussed lab and imaging findings as well as pertinent plan - they recommend: Control blood pressure and consult with Dr. Redmond Baseman who was consulted on the past or Dr. Wilburn Cornelia was planning to see her tomorrow.  If they would prefer not to come see the patient, he will come see her himself.   ED Course/Re-evaluation: Presents with no acute distress, nontoxic-appearing blood pressure elevated about 190/54.  Vitals otherwise unremarkable.  Nasal packing appears in place with out active bleeding noted.  Her hemoglobin is 6.9 today, which is similar to previous in the last week.  She is not acutely symptomatic and is currently taking iron supplementation.  After discussion with my attending, emergent blood transfusion is not required at this time.  On reevaluation, patient states that she occasionally has some scant amounts of "oozing" around the nasal packing as she believes the packing is now full.  Given this, I consulted with Dr. Marcelline Deist who recommends controlling patient's blood pressure.  Given that Dr. Redmond Baseman has consulted on this patient and she has an appointment tomorrow with Dr. Wilburn Cornelia, he recommends following up with them for further recommendations. Patient care handed over to [place] PA-C at time of shift change.  Plan is to likely replace nasal packing and discharge home  so she can follow-up outpatient tomorrow with ENT.  Plan is subject to change at the discretion of the accepting  provider and I appreciate her help with this case.   Final Clinical Impression(s) / ED Diagnoses Final diagnoses:  Right-sided epistaxis    Rx / DC Orders ED Discharge Orders     None         Rodena Piety 05/16/22 1555    Hayden Rasmussen, MD 05/16/22 [phone]

## 2022-05-16 NOTE — ED Provider Triage Note (Signed)
Emergency Medicine Provider Triage Evaluation Note  Gwendolyn [name] , a 66 y.o. female  was evaluated in triage.  Pt was seen yesterday for nosebleed.  It was controlled but now it is bleeding again.  Started bleeding around 9 AM.  Some fatigue.  Yesterday her hemoglobin was 7 however she was clinically stable and no transfusion was ordered  Review of Systems  Positive: Epistasis and fatigue Negative: Dizziness, shortness of breath or palpitations  Physical Exam  BP (!) 183/63 (BP Location: Right Arm)   Pulse 73   Temp 98.3 F (36.8 C) (Oral)   Resp 16   Ht 4' 11.5" (1.511 m)   Wt 63 kg   LMP  (LMP Unknown)   SpO2 98%   BMI 27.60 kg/m  Gen:   Awake, no distress   Resp:  Normal effort  MSK:   Moves extremities without difficulty  Other:  Packing noted in right nare.  Mild blood noted in left nare.  Bleeding appears controlled  Medical Decision Making  Medically screening exam initiated at 11:23 AM.  Appropriate orders placed.  SABRENA GAVITT was informed that the remainder of the evaluation will be completed by another provider, this initial triage assessment does not replace that evaluation, and the importance of remaining in the ED until their evaluation is complete.    We will redo blood counts.  Bleeding seems controlled.     Rhae Hammock, PA-C 05/16/22 1124

## 2022-05-16 NOTE — ED Triage Notes (Signed)
Pt arrived POV from home c/o recurrent nosebleeds. Pt states this has been going on since Thursday and she has had 2 rockets placed. Was suppose to see ENT today and when it started bleeding they asked her to come here.

## 2022-05-16 NOTE — ED Provider Notes (Signed)
Accepted handoff at shift change from [place], [place]. Please see prior provider note for more detail.   Briefly: Patient is 66 y.o. female   DDX: concern for epistaxis  Plan: treat bp consider calling ENT if not stopping   Physical Exam  BP (!) 190/59 (BP Location: Left Arm)   Pulse 84   Temp 98.2 F (36.8 C) (Oral)   Resp 18   Ht 4' 11.5" (1.511 m)   Wt 63 kg   LMP  (LMP Unknown)   SpO2 100%   BMI 27.60 kg/m   Physical Exam  Procedures  Procedures  ED Course / MDM   Clinical Course as of 05/16/22 1747  Mon May 16, 2022  1530 Epistaxis. 4th ED visit since July 7th. 2nd pack placed yesterday. Bleeding restarted yesterday. Treat BP. Consult Dr. Redmond Baseman for recommendations.  [GL]  9794 I saw and evaluated this patient. Her nose has stopped bleeding. She does not feel any post nasal droop. She has rhino rocket in place from yesterdays visit that is not dripping. I discussed replacing rhino rocket versus leaving this in with nasal reinforcement since she has follow up tomorrow morning.  [GL]    Clinical Course User Index [GL] Adolphus Birchwood, PA-C   Medical Decision Making Risk Prescription drug management.   Presents with a continued right nosebleed has been going on since July 7.  She has been seen multiple times.  She just had a Rhino Rocket placed yesterday.  She states that it started bleeding this morning so she came to the emergency department.  Right before she got here the bleeding had stopped.  She still has the [employer] in place.  Her labs revealed a hemoglobin of 6.9, however she has had multiple hemoglobins drawn in the past 4 days and the most recent one was 7.0.  This is not a significant drop.  The prior provider has already called ENT who is recommended blood pressure control and calling Dr. Redmond Baseman team if it continues to bleed.  When I assessed patient, the bleeding was stopped.  She had no evidence of any blood in the back of her throat.  Rhino Rocket was  intact with no dripping present.  I discussed replacing the [employer] with the patient, and she prefers to keep it intact as long as the bleeding has stopped.  This is reasonable.  She has a follow-up appointment with ENT tomorrow afternoon.  I told her that as long as it continues to not bleed, she can continue to follow-up with that appointment.       Adolphus Birchwood, PA-C 05/16/22 1750    Lucrezia Starch, MD 05/16/22 [phone]

## 2022-10-18 ENCOUNTER — Emergency Department (HOSPITAL_COMMUNITY): Payer: Medicare Other

## 2022-10-18 ENCOUNTER — Observation Stay (HOSPITAL_COMMUNITY)
Admission: EM | Admit: 2022-10-18 | Discharge: 2022-10-21 | Disposition: A | Discharge status: Home / Self Care | Payer: Medicare Other | Attending: Internal Medicine | Admitting: Internal Medicine

## 2022-10-18 DIAGNOSIS — R0602 Shortness of breath: Secondary | ICD-10-CM | POA: Diagnosis present

## 2022-10-18 DIAGNOSIS — I110 Hypertensive heart disease with heart failure: Secondary | ICD-10-CM | POA: Insufficient documentation

## 2022-10-18 DIAGNOSIS — D123 Benign neoplasm of transverse colon: Secondary | ICD-10-CM | POA: Diagnosis not present

## 2022-10-18 DIAGNOSIS — I5032 Chronic diastolic (congestive) heart failure: Secondary | ICD-10-CM | POA: Diagnosis not present

## 2022-10-18 DIAGNOSIS — Z7902 Long term (current) use of antithrombotics/antiplatelets: Secondary | ICD-10-CM | POA: Diagnosis not present

## 2022-10-18 DIAGNOSIS — D649 Anemia, unspecified: Secondary | ICD-10-CM | POA: Diagnosis not present

## 2022-10-18 DIAGNOSIS — F17210 Nicotine dependence, cigarettes, uncomplicated: Secondary | ICD-10-CM | POA: Diagnosis not present

## 2022-10-18 DIAGNOSIS — Z79899 Other long term (current) drug therapy: Secondary | ICD-10-CM | POA: Diagnosis not present

## 2022-10-18 DIAGNOSIS — I5033 Acute on chronic diastolic (congestive) heart failure: Secondary | ICD-10-CM

## 2022-10-18 DIAGNOSIS — K3189 Other diseases of stomach and duodenum: Secondary | ICD-10-CM | POA: Insufficient documentation

## 2022-10-18 DIAGNOSIS — Q251 Coarctation of aorta: Secondary | ICD-10-CM

## 2022-10-18 DIAGNOSIS — Z122 Encounter for screening for malignant neoplasm of respiratory organs: Secondary | ICD-10-CM

## 2022-10-18 DIAGNOSIS — D509 Iron deficiency anemia, unspecified: Principal | ICD-10-CM | POA: Insufficient documentation

## 2022-10-18 DIAGNOSIS — E538 Deficiency of other specified B group vitamins: Secondary | ICD-10-CM | POA: Diagnosis present

## 2022-10-18 DIAGNOSIS — I5022 Chronic systolic (congestive) heart failure: Secondary | ICD-10-CM | POA: Diagnosis present

## 2022-10-18 DIAGNOSIS — J449 Chronic obstructive pulmonary disease, unspecified: Secondary | ICD-10-CM | POA: Insufficient documentation

## 2022-10-18 DIAGNOSIS — R0600 Dyspnea, unspecified: Secondary | ICD-10-CM

## 2022-10-18 DIAGNOSIS — Z7982 Long term (current) use of aspirin: Secondary | ICD-10-CM | POA: Diagnosis not present

## 2022-10-18 DIAGNOSIS — E039 Hypothyroidism, unspecified: Secondary | ICD-10-CM | POA: Diagnosis not present

## 2022-10-18 DIAGNOSIS — I10 Essential (primary) hypertension: Secondary | ICD-10-CM | POA: Diagnosis present

## 2022-10-18 DIAGNOSIS — I1A0 Resistant hypertension: Secondary | ICD-10-CM

## 2022-10-18 HISTORY — DX: Acute on chronic diastolic (congestive) heart failure: I50.33

## 2022-10-18 LAB — CBC WITH DIFFERENTIAL/PLATELET
Abs Immature Granulocytes: 0.05 10*3/uL (ref 0.00–0.07)
Basophils Absolute: 0.1 10*3/uL (ref 0.0–0.1)
Basophils Relative: 1 %
Eosinophils Absolute: 0.1 10*3/uL (ref 0.0–0.5)
Eosinophils Relative: 1 %
HCT: 19 % — ABNORMAL LOW (ref 36.0–46.0)
Hemoglobin: 5.5 g/dL — CL (ref 12.0–15.0)
Immature Granulocytes: 1 %
Lymphocytes Relative: 16 %
Lymphs Abs: 1.3 10*3/uL (ref 0.7–4.0)
MCH: 24.1 pg — ABNORMAL LOW (ref 26.0–34.0)
MCHC: 28.9 g/dL — ABNORMAL LOW (ref 30.0–36.0)
MCV: 83.3 fL (ref 80.0–100.0)
Monocytes Absolute: 0.7 10*3/uL (ref 0.1–1.0)
Monocytes Relative: 9 %
Neutro Abs: 6.2 10*3/uL (ref 1.7–7.7)
Neutrophils Relative %: 72 %
Platelets: 190 10*3/uL (ref 150–400)
RBC: 2.28 MIL/uL — ABNORMAL LOW (ref 3.87–5.11)
RDW: 18.6 % — ABNORMAL HIGH (ref 11.5–15.5)
WBC: 8.5 10*3/uL (ref 4.0–10.5)
nRBC: 0 % (ref 0.0–0.2)

## 2022-10-18 LAB — BASIC METABOLIC PANEL
Anion gap: 7 (ref 5–15)
BUN: 10 mg/dL (ref 8–23)
CO2: 22 mmol/L (ref 22–32)
Calcium: 8.6 mg/dL — ABNORMAL LOW (ref 8.9–10.3)
Chloride: 106 mmol/L (ref 98–111)
Creatinine, Ser: 0.69 mg/dL (ref 0.44–1.00)
GFR, Estimated: >60 mL/min (ref >60)
Glucose, Bld: 99 mg/dL (ref 70–99)
Potassium: 3.5 mmol/L (ref 3.5–5.1)
Sodium: 135 mmol/L (ref 135–145)

## 2022-10-18 LAB — POC OCCULT BLOOD, ED: Fecal Occult Bld: POSITIVE — AB

## 2022-10-18 LAB — PREPARE RBC (CROSSMATCH)

## 2022-10-18 MED ORDER — SODIUM CHLORIDE 0.9% IV SOLUTION: Freq: Once | INTRAVENOUS | Status: AC

## 2022-10-18 MED ORDER — LACTATED RINGERS IV SOLN: INTRAVENOUS | Status: DC

## 2022-10-18 NOTE — ED Provider Notes (Signed)
[facility] DEPT Provider Note   CSN: [id number] Arrival date & time: 10/18/22  9562     History  Chief Complaint  Patient presents with   Shortness of Breath    Gwendolyn [name] is a 66 y.o. female.  67 year old female with history of COPD as well as colonic polyp and prior history of GI bleed presents with low hemoglobin.  Patient went to her doctor a week ago and had blood work done hemoglobin was 6.  Has not had transfusion.  Has dark stools but she is on iron therapy.  Does have history of COPD is not on home oxygen.  Patient does endorse increased dyspnea on exertion.  Denies hematemesis.       Home Medications Prior to Admission medications   Medication Sig Start Date End Date Taking? Authorizing Provider  amLODipine (NORVASC) 5 MG tablet Take 10 mg by mouth daily. 06/12/21   [provider]  aspirin EC 81 MG EC tablet Take 1 tablet (81 mg total) by mouth daily. Swallow whole. 02/07/22   Domenic Polite, MD  atorvastatin (LIPITOR) 80 MG tablet Take 80 mg by mouth daily. 09/01/21   [provider]  Budeson-Glycopyrrol-Formoterol (BREZTRI AEROSPHERE) 160-9-4.8 MCG/ACT AERO Inhale 2 puffs into the lungs in the morning and at bedtime.    [provider]  clopidogrel (PLAVIX) 75 MG tablet Take 1 tablet (75 mg total) by mouth daily with breakfast. 02/08/22   Domenic Polite, MD  furosemide (LASIX) 40 MG tablet Take 0.5 tablets (20 mg total) by mouth daily. 02/07/22   Domenic Polite, MD  hydrALAZINE (APRESOLINE) 50 MG tablet TAKE 1 TABLET(50 MG) BY MOUTH THREE TIMES DAILY Patient taking differently: Take 50 mg by mouth 3 (three) times daily. 01/31/22   Cantwell, Celeste C, PA-C  Iron-FA-B Cmp-C-Biot-Probiotic (FUSION PLUS) CAPS Take 1 capsule by mouth every other day.    [provider]  isosorbide dinitrate (ISORDIL) 30 MG tablet Take 1 tablet (30 mg total) by mouth 3 (three) times daily. 02/07/22   Domenic Polite, MD   labetalol (NORMODYNE) 200 MG tablet Take 1 tablet (200 mg total) by mouth 2 (two) times daily. 300 MG IN THE AM 200 MG IN THE PM 02/07/22   Domenic Polite, MD  levothyroxine (SYNTHROID) 137 MCG tablet Take 137 mcg by mouth daily before breakfast.    [provider]  potassium chloride (KLOR-CON) 10 MEQ tablet Take 1 tablet (10 mEq total) by mouth daily as needed. With Furosemide only 01/30/22 04/30/22  Adrian Prows, MD      Allergies    Doxycycline, Azilsartan, Biaxin [clarithromycin], Floxacillin [floxacillin (flucloxacillin)], and Morphine and related    Review of Systems   Review of Systems  All other systems reviewed and are negative.   Physical Exam Updated Vital Signs BP (!) 211/66 (BP Location: Right Arm)   Pulse 92   Temp 98.5 F (36.9 C) (Oral)   Resp 16   Ht 1.511 m (4' 11.5")   Wt 63 kg   LMP  (LMP Unknown)   SpO2 99%   BMI 27.60 kg/m  Physical Exam Vitals and nursing note reviewed.  Constitutional:      General: She is not in acute distress.    Appearance: Normal appearance. She is well-developed. She is not toxic-appearing.  HENT:     Head: Normocephalic and atraumatic.  Eyes:     General: Lids are normal.     Conjunctiva/sclera: Conjunctivae normal.     Pupils: Pupils are  equal, round, and reactive to light.  Neck:     Thyroid: No thyroid mass.     Trachea: No tracheal deviation.  Cardiovascular:     Rate and Rhythm: Normal rate and regular rhythm.     Heart sounds: Normal heart sounds. No murmur heard.    No gallop.  Pulmonary:     Effort: Pulmonary effort is normal. No respiratory distress.     Breath sounds: Normal breath sounds. No stridor. No decreased breath sounds, wheezing, rhonchi or rales.  Abdominal:     General: There is no distension.     Palpations: Abdomen is soft.     Tenderness: There is no abdominal tenderness. There is no rebound.  Musculoskeletal:        General: No tenderness. Normal range of motion.     Cervical back:  Normal range of motion and neck supple.  Skin:    General: Skin is warm and dry.     Coloration: Skin is pale.     Findings: No abrasion or rash.  Neurological:     Mental Status: She is alert and oriented to person, place, and time. Mental status is at baseline.     GCS: GCS eye subscore is 4. GCS verbal subscore is 5. GCS motor subscore is 6.     Cranial Nerves: No cranial nerve deficit.     Sensory: No sensory deficit.     Motor: Motor function is intact.  Psychiatric:        Attention and Perception: Attention normal.        Speech: Speech normal.        Behavior: Behavior normal.     ED Results / Procedures / Treatments   Labs (all labs ordered are listed, but only abnormal results are displayed) Labs Reviewed  BASIC METABOLIC PANEL  CBC WITH DIFFERENTIAL/PLATELET  TYPE AND SCREEN    EKG None  Radiology No results found.  Procedures Procedures    Medications Ordered in ED Medications  lactated ringers infusion (has no administration in time range)    ED Course/ Medical Decision Making/ A&P                           Medical Decision Making Amount and/or Complexity of Data Reviewed Labs: ordered. Radiology: ordered.  Risk Prescription drug management.  Patient is fecal occult positive here.  Patient states she is chronically fecal occult positive.  Denies any melanotic stools.  Patient did complain of being short of breath and chest x-ray per my interpretation did not show any evidence of severe process. Patient's hemoglobin here.  Patient's hemoglobin is 5.5.  Have ordered 2 units of packed red blood cells.  Will send care chat to Dr. Collene Mares, patient's GI doctor.  Will admit to the hospitalist service        Final Clinical Impression(s) / ED Diagnoses Final diagnoses:  None    Rx / DC Orders ED Discharge Orders     None         Lacretia Leigh, MD 10/18/22 2253

## 2022-10-18 NOTE — H&P (Signed)
History and Physical    [name]:[id number] DOB: 01-13-1956 DOA: 10/18/2022  PCP: Jilda Panda, MD  Patient coming from: Home  I have personally briefly reviewed patient's old medical records in [place]  Chief Complaint: Anemia  HPI: Gwendolyn [name] is a 66 y.o. female with medical history significant for chronic diastolic CHF (EF 83-38%), COPD, suprarenal abdominal aortic stenosis, anemia, HTN, hypothyroidism, anxiety who presented to the ED for evaluation of symptomatic anemia.  Patient with recent fatigue.  She went to her PCP a week ago at which time labs were obtained.  She was notified that her hemoglobin level was 6.  She has had some exertional dyspnea.  She reports chronically dark stools which she attributes to iron supplementation.  She has a history of nosebleeds but denies any recently.  Denies any hemoptysis, hematemesis, hematuria, hematochezia.  Has a history of vascular disease and suprarenal abdominal aortic stenosis.  Was supposed to be on aspirin and Plavix but states he has not been taking these.  Denies any blood thinner use.  ED Course  Labs/Imaging on admission: I have personally reviewed following labs and imaging studies.  Initial vitals showed BP 198/68, pulse 91, RR 23, temp 98.5 F, SpO2 99% on room air.  Labs show hemoglobin 5.5 (6.9 on 05/16/2022), hematocrit 19.0, MCV 83.3, platelets 190,000, WBC 8.5, sodium 135, potassium 3.5, bicarb 22, BUN 10, creatinine 0.69, serum glucose 99.  FOBT is positive.  Portable chest x-ray shows cardiomegaly with slightly prominent central vessels without overt edema.  Minimal pleural effusions bilaterally noted.  Patient was ordered to receive 2 unit PRBC transfusion.  EDP notified GI, Dr. Benson Norway, via secure chat.  The hospitalist service was consulted to admit for further evaluation and management.  Review of Systems: All systems reviewed and are negative except as documented in history of present  illness above.   Past Medical History:  Diagnosis Date   Anemia    Anxiety    Asthma    COPD (chronic obstructive pulmonary disease) (HCC)    Depression    GERD (gastroesophageal reflux disease)    History of kidney stones    Hypertension    Hypothyroidism     Past Surgical History:  Procedure Laterality Date   ABDOMINAL AORTOGRAM W/LOWER EXTREMITY N/A 02/03/2022   Procedure: ABDOMINAL AORTOGRAM W/LOWER EXTREMITY;  Surgeon: Adrian Prows, MD;  Location: [facility];  Service: Cardiovascular;  Laterality: N/A;   ABDOMINAL HYSTERECTOMY     Cesarean Section     (2)   CHOLECYSTECTOMY     COLONOSCOPY  2020   Gastric Ulcer  2020   PERIPHERAL VASCULAR BALLOON ANGIOPLASTY  02/03/2022   Procedure: PERIPHERAL VASCULAR BALLOON ANGIOPLASTY;  Surgeon: Adrian Prows, MD;  Location: [facility];  Service: Cardiovascular;;   RENAL ANGIOGRAPHY N/A 02/03/2022   Procedure: RENAL ANGIOGRAPHY;  Surgeon: Adrian Prows, MD;  Location: [facility];  Service: Cardiovascular;  Laterality: N/A;   UMBILICAL HERNIA REPAIR N/A 10/29/2020   Procedure: OPEN UMBILICAL HERNIA REPAIR WITH MESH;  Surgeon: Stechschulte, Nickola Major, MD;  Location: [facility];  Service: General;  Laterality: N/A;    Social History:  reports that she has been smoking cigarettes. She has a 40.00 pack-year smoking history. She has never used smokeless tobacco. She reports that she does not drink alcohol and does not use drugs.  Allergies  Allergen Reactions   Doxycycline Anaphylaxis   Azilsartan     Other reaction(s): cough   Biaxin [  Clarithromycin] Other (See Comments)    Vertigo   Floxacillin [Floxacillin (Flucloxacillin)] Other (See Comments)    Vertigo   Morphine And Related Other (See Comments)    Headache    Family History  Problem Relation Age of Onset   Colon cancer Mother    Diabetes Father    Diabetes Sister    Breast cancer Sister 73   Hypertension Sister      Prior to Admission medications   Medication  Sig Start Date End Date Taking? Authorizing Provider  amLODipine (NORVASC) 5 MG tablet Take 10 mg by mouth daily. 06/12/21   [provider]  aspirin EC 81 MG EC tablet Take 1 tablet (81 mg total) by mouth daily. Swallow whole. 02/07/22   Domenic Polite, MD  atorvastatin (LIPITOR) 80 MG tablet Take 80 mg by mouth daily. 09/01/21   [provider]  Budeson-Glycopyrrol-Formoterol (BREZTRI AEROSPHERE) 160-9-4.8 MCG/ACT AERO Inhale 2 puffs into the lungs in the morning and at bedtime.    [provider]  clopidogrel (PLAVIX) 75 MG tablet Take 1 tablet (75 mg total) by mouth daily with breakfast. 02/08/22   Domenic Polite, MD  furosemide (LASIX) 40 MG tablet Take 0.5 tablets (20 mg total) by mouth daily. 02/07/22   Domenic Polite, MD  hydrALAZINE (APRESOLINE) 50 MG tablet TAKE 1 TABLET(50 MG) BY MOUTH THREE TIMES DAILY Patient taking differently: Take 50 mg by mouth 3 (three) times daily. 01/31/22   Cantwell, Celeste C, PA-C  Iron-FA-B Cmp-C-Biot-Probiotic (FUSION PLUS) CAPS Take 1 capsule by mouth every other day.    [provider]  isosorbide dinitrate (ISORDIL) 30 MG tablet Take 1 tablet (30 mg total) by mouth 3 (three) times daily. 02/07/22   Domenic Polite, MD  labetalol (NORMODYNE) 200 MG tablet Take 1 tablet (200 mg total) by mouth 2 (two) times daily. 300 MG IN THE AM 200 MG IN THE PM 02/07/22   Domenic Polite, MD  levothyroxine (SYNTHROID) 137 MCG tablet Take 137 mcg by mouth daily before breakfast.    [provider]  potassium chloride (KLOR-CON) 10 MEQ tablet Take 1 tablet (10 mEq total) by mouth daily as needed. With Furosemide only 01/30/22 04/30/22  Adrian Prows, MD    Physical Exam: Vitals:   10/18/22 2253 10/18/22 2300 10/18/22 2336 10/19/22 0000  BP: (!) 192/55 (!) 192/55 (!) 184/82   Pulse: 85 87 89 90  Resp: '16 20 18 20  '$ Temp: 98.7 F (37.1 C) 98.7 F (37.1 C) 99.2 F (37.3 C) 98.6 F (37 C)  TempSrc: Oral Oral Oral Oral  SpO2:  99% 98%    Weight:      Height:       Constitutional: Resting in bed, NAD, calm, comfortable Eyes: Exophthalmos, lids and conjunctivae normal ENMT: Mucous membranes are moist. Posterior pharynx clear of any exudate or lesions.Normal dentition.  Neck: normal, supple, no masses. Respiratory: clear to auscultation bilaterally, no wheezing, no crackles. Normal respiratory effort. No accessory muscle use.  Cardiovascular: Regular rate and rhythm, no murmurs / rubs / gallops. No extremity edema. 2+ pedal pulses. Abdomen: no tenderness, no masses palpated.  Musculoskeletal: no clubbing / cyanosis. No joint deformity upper and lower extremities. Good ROM, no contractures. Normal muscle tone.  Skin: no rashes, lesions, ulcers. No induration Neurologic: Sensation intact. Strength 5/5 in all 4.  Psychiatric: Alert and oriented x 3. Normal mood.   EKG: Not performed.  Assessment/Plan Principal Problem:   Symptomatic anemia Active Problems:   Heart failure with mildly  reduced ejection fraction (HFmrEF) (HCC)   COPD (chronic obstructive pulmonary disease) (HCC)   Abdominal aortic stenosis   Essential hypertension   Hypothyroidism   Gwendolyn [name] is a 66 y.o. female with medical history significant for chronic diastolic CHF (EF 47-65%), COPD, suprarenal abdominal aortic stenosis, anemia, HTN, hypothyroidism, anxiety who is admitted with symptomatic anemia.  Hemoglobin 5.5, FOBT positive.  Transfusing 2 unit PRBC.  Assessment and Plan: * Symptomatic anemia Hemoglobin 5.5 on admission.  FOBT is positive.  Attributes black stools to iron supplement.  History of epistaxis but denies recent nosebleeds.  Denies use of any blood thinners, states she stopped taking aspirin and Plavix months ago. -Transfusing 2 unit PRBC -Start IV Protonix BID -Check anemia panel -EDP notified GI ([facility]) -Follow-up posttransfusion CBC  Heart failure with mildly reduced ejection fraction (HFmrEF) (HCC) Euvolemic on  admission.  EF 40-45% by TTE 02/04/2022.  Continue Lasix 20 mg daily.  Hypothyroidism Continue Synthroid.  Essential hypertension BP elevated on arrival.  Patient states SBP usually runs 160s. -Resume home hydralazine, labetalol, amlodipine, Isordil  Abdominal aortic stenosis History of suprarenal abdominal aortic stenosis, high-grade, 90-99%, treated with shockwave lithotripsy, balloon angioplasty 02/03/2022.  Patient states she only took aspirin and Plavix for 3 weeks afterwards and has not been taking any antiplatelet since then. -Follow-up with Dr. Einar Gip  COPD (chronic obstructive pulmonary disease) ([place]) Stable, no wheezing.  Continue albuterol as needed.  DVT prophylaxis: SCDs Start: 10/19/22 0103 Code Status: DNI, confirmed with patient on admission Family Communication: Discussed with patient, she has discussed with family Disposition Plan: From home, dispo pending clinical progress Consults called: GI ([facility]) notified by EDP Severity of Illness: The appropriate patient status for this patient is OBSERVATION. Observation status is judged to be reasonable and necessary in order to provide the required intensity of service to ensure the patient's safety. The patient's presenting symptoms, physical exam findings, and initial radiographic and laboratory data in the context of their medical condition is felt to place them at decreased risk for further clinical deterioration. Furthermore, it is anticipated that the patient will be medically stable for discharge from the hospital within 2 midnights of admission.   Zada Finders MD [facility]  If 7PM-7AM, please contact night-coverage [email or web address]  10/19/2022, 1:14 AM

## 2022-10-18 NOTE — ED Triage Notes (Signed)
Pt arrived from home with a shingles outbreak since Nov 27th. Pt has SOB and low Hgb according to her transfusion doc. Pt is Aox4

## 2022-10-19 ENCOUNTER — Other Ambulatory Visit: Payer: Self-pay

## 2022-10-19 ENCOUNTER — Encounter (HOSPITAL_COMMUNITY): Payer: Self-pay | Admitting: Internal Medicine

## 2022-10-19 DIAGNOSIS — D649 Anemia, unspecified: Secondary | ICD-10-CM | POA: Diagnosis not present

## 2022-10-19 DIAGNOSIS — I10 Essential (primary) hypertension: Secondary | ICD-10-CM

## 2022-10-19 DIAGNOSIS — J449 Chronic obstructive pulmonary disease, unspecified: Secondary | ICD-10-CM

## 2022-10-19 DIAGNOSIS — E039 Hypothyroidism, unspecified: Secondary | ICD-10-CM

## 2022-10-19 DIAGNOSIS — I5022 Chronic systolic (congestive) heart failure: Secondary | ICD-10-CM | POA: Diagnosis not present

## 2022-10-19 LAB — BASIC METABOLIC PANEL
Anion gap: 8 (ref 5–15)
BUN: 10 mg/dL (ref 8–23)
CO2: 22 mmol/L (ref 22–32)
Calcium: 8.8 mg/dL — ABNORMAL LOW (ref 8.9–10.3)
Chloride: 106 mmol/L (ref 98–111)
Creatinine, Ser: 0.66 mg/dL (ref 0.44–1.00)
GFR, Estimated: >60 mL/min (ref >60)
Glucose, Bld: 107 mg/dL — ABNORMAL HIGH (ref 70–99)
Potassium: 3.7 mmol/L (ref 3.5–5.1)
Sodium: 136 mmol/L (ref 135–145)

## 2022-10-19 LAB — IRON AND TIBC
Iron: 17 ug/dL — ABNORMAL LOW (ref 28–170)
Saturation Ratios: 3 % — ABNORMAL LOW (ref 10.4–31.8)
TIBC: 494 ug/dL — ABNORMAL HIGH (ref 250–450)
UIBC: 477 ug/dL

## 2022-10-19 LAB — VITAMIN B12: Vitamin B-12: 134 pg/mL — ABNORMAL LOW (ref 180–914)

## 2022-10-19 LAB — CBC
HCT: 26.4 % — ABNORMAL LOW (ref 36.0–46.0)
Hemoglobin: 8.3 g/dL — ABNORMAL LOW (ref 12.0–15.0)
MCH: 26.3 pg (ref 26.0–34.0)
MCHC: 31.4 g/dL (ref 30.0–36.0)
MCV: 83.8 fL (ref 80.0–100.0)
Platelets: 182 10*3/uL (ref 150–400)
RBC: 3.15 MIL/uL — ABNORMAL LOW (ref 3.87–5.11)
RDW: 17.1 % — ABNORMAL HIGH (ref 11.5–15.5)
WBC: 8.8 10*3/uL (ref 4.0–10.5)
nRBC: 0.2 % (ref 0.0–0.2)

## 2022-10-19 LAB — FOLATE: Folate: 11.2 ng/mL (ref >5.9)

## 2022-10-19 LAB — FERRITIN: Ferritin: 4 ng/mL — ABNORMAL LOW (ref 11–307)

## 2022-10-19 MED ORDER — LABETALOL HCL 200 MG PO TABS
200.0000 mg | ORAL_TABLET | Freq: Every day | ORAL | Status: DC
  Administered 2022-10-19 – 2022-10-20 (×2): 200 mg via ORAL
  Filled 2022-10-19 (×2): qty 1

## 2022-10-19 MED ORDER — SENNOSIDES-DOCUSATE SODIUM 8.6-50 MG PO TABS: 1.0000 | ORAL_TABLET | Freq: Every evening | ORAL | Status: DC | PRN

## 2022-10-19 MED ORDER — CYANOCOBALAMIN 1000 MCG/ML IJ SOLN
1000.0000 ug | INTRAMUSCULAR | Status: DC
  Administered 2022-10-19: 1000 ug via INTRAMUSCULAR
  Filled 2022-10-19: qty 1

## 2022-10-19 MED ORDER — FUROSEMIDE 20 MG PO TABS
20.0000 mg | ORAL_TABLET | Freq: Every day | ORAL | Status: DC
  Administered 2022-10-19 – 2022-10-21 (×3): 20 mg via ORAL
  Filled 2022-10-19 (×4): qty 1

## 2022-10-19 MED ORDER — LEVOTHYROXINE SODIUM 137 MCG PO TABS: 137.0000 ug | ORAL_TABLET | Freq: Every day | ORAL | Status: DC

## 2022-10-19 MED ORDER — SODIUM CHLORIDE 0.9% FLUSH
3.0000 mL | Freq: Two times a day (BID) | INTRAVENOUS | Status: DC
  Administered 2022-10-19 – 2022-10-21 (×6): 3 mL via INTRAVENOUS

## 2022-10-19 MED ORDER — ALBUTEROL SULFATE (2.5 MG/3ML) 0.083% IN NEBU: 2.5000 mg | INHALATION_SOLUTION | Freq: Four times a day (QID) | RESPIRATORY_TRACT | Status: DC | PRN

## 2022-10-19 MED ORDER — PANTOPRAZOLE SODIUM 40 MG IV SOLR
40.0000 mg | Freq: Two times a day (BID) | INTRAVENOUS | Status: DC
  Administered 2022-10-19 – 2022-10-21 (×6): 40 mg via INTRAVENOUS
  Filled 2022-10-19 (×6): qty 10

## 2022-10-19 MED ORDER — ALBUTEROL SULFATE HFA 108 (90 BASE) MCG/ACT IN AERS: 1.0000 | INHALATION_SPRAY | Freq: Four times a day (QID) | RESPIRATORY_TRACT | Status: DC | PRN

## 2022-10-19 MED ORDER — LEVOTHYROXINE SODIUM 100 MCG PO TABS
100.0000 ug | ORAL_TABLET | Freq: Every day | ORAL | Status: DC
  Administered 2022-10-19 – 2022-10-20 (×2): 100 ug via ORAL
  Filled 2022-10-19 (×3): qty 1

## 2022-10-19 MED ORDER — AMLODIPINE BESYLATE 10 MG PO TABS
10.0000 mg | ORAL_TABLET | Freq: Every day | ORAL | Status: DC
  Administered 2022-10-19 – 2022-10-21 (×3): 10 mg via ORAL
  Filled 2022-10-19 (×2): qty 1
  Filled 2022-10-19: qty 2

## 2022-10-19 MED ORDER — ISOSORBIDE DINITRATE 20 MG PO TABS: 30.0000 mg | ORAL_TABLET | Freq: Three times a day (TID) | ORAL | Status: DC

## 2022-10-19 MED ORDER — ISOSORBIDE MONONITRATE ER 30 MG PO TB24
30.0000 mg | ORAL_TABLET | Freq: Every day | ORAL | Status: DC
  Administered 2022-10-19 – 2022-10-21 (×3): 30 mg via ORAL
  Filled 2022-10-19 (×3): qty 1

## 2022-10-19 MED ORDER — ENALAPRILAT 1.25 MG/ML IV SOLN
1.2500 mg | Freq: Four times a day (QID) | INTRAVENOUS | Status: DC | PRN
  Administered 2022-10-19: 1.25 mg via INTRAVENOUS
  Filled 2022-10-19 (×2): qty 1

## 2022-10-19 MED ORDER — ACETAMINOPHEN 325 MG PO TABS
650.0000 mg | ORAL_TABLET | Freq: Four times a day (QID) | ORAL | Status: DC | PRN
  Administered 2022-10-20: 650 mg via ORAL
  Filled 2022-10-19: qty 2

## 2022-10-19 MED ORDER — NICOTINE 21 MG/24HR TD PT24
21.0000 mg | MEDICATED_PATCH | Freq: Every day | TRANSDERMAL | Status: DC
  Administered 2022-10-19 – 2022-10-21 (×3): 21 mg via TRANSDERMAL
  Filled 2022-10-19 (×3): qty 1

## 2022-10-19 MED ORDER — ONDANSETRON HCL 4 MG PO TABS: 4.0000 mg | ORAL_TABLET | Freq: Four times a day (QID) | ORAL | Status: DC | PRN

## 2022-10-19 MED ORDER — ONDANSETRON HCL 4 MG/2ML IJ SOLN: 4.0000 mg | Freq: Four times a day (QID) | INTRAMUSCULAR | Status: DC | PRN

## 2022-10-19 MED ORDER — ACETAMINOPHEN 650 MG RE SUPP: 650.0000 mg | Freq: Four times a day (QID) | RECTAL | Status: DC | PRN

## 2022-10-19 MED ORDER — ATORVASTATIN CALCIUM 40 MG PO TABS
80.0000 mg | ORAL_TABLET | Freq: Every day | ORAL | Status: DC
  Administered 2022-10-19 – 2022-10-21 (×3): 80 mg via ORAL
  Filled 2022-10-19 (×3): qty 2

## 2022-10-19 MED ORDER — HYDRALAZINE HCL 50 MG PO TABS
50.0000 mg | ORAL_TABLET | Freq: Three times a day (TID) | ORAL | Status: DC
  Administered 2022-10-19 – 2022-10-21 (×8): 50 mg via ORAL
  Filled 2022-10-19: qty 2
  Filled 2022-10-19 (×6): qty 1
  Filled 2022-10-19: qty 2

## 2022-10-19 MED ORDER — LABETALOL HCL 200 MG PO TABS
300.0000 mg | ORAL_TABLET | Freq: Every day | ORAL | Status: DC
  Administered 2022-10-19 – 2022-10-21 (×3): 300 mg via ORAL
  Filled 2022-10-19: qty 1
  Filled 2022-10-19 (×3): qty 1.5

## 2022-10-19 NOTE — Consult Note (Addendum)
Reason for Consult:severe anemia. Referring Physician: THP  CORAH WILLEFORD is an 66 y.o. female.  HPI: [name] is a 66 year old white female, with multiple medical problems listed below, who presented to ER at [facility] for exertional dyspnea.  In the ER she was noted to have a hemoglobin of 5.5 g/dL and she was transfused 2 units of packed red blood cells. She has a history of chronic iron anemia.  She had a EGD and colonoscopy done in May 2020 when she was noted to have a gastric ulcer and was positive for H. pylori for which she was treated with quadruple therapy and multiple tubular adenomas were removed from the colon.  She was advised to have repeat colonoscopy in May of this year when a reminder was sent to her but she did not respond to her reminders.She had an iron infusion earlier this year but denies having any hematochezia or hematemesis with nausea vomiting. She has had some constipation recently because she has been on some narcotics. She has been taking Colace as needed.  She has some dark stool as she is on iron supplements.  She has a 40-pack-year history of smoking and has chronic COPD all along with a history of hypertension, hypothyroidism, and suprarenal abdominal aortic stenosis.  She denies use of nonsteroidals. Her mother had colon cancer.  Past Medical History:  Diagnosis Date   Anemia    Anxiety    Asthma    COPD (chronic obstructive pulmonary disease) (HCC)    Depression    GERD (gastroesophageal reflux disease)    History of kidney stones    Hypertension    Hypothyroidism/thyroid eye disease    Past Surgical History:  Procedure Laterality Date   ABDOMINAL AORTOGRAM W/LOWER EXTREMITY N/A 02/03/2022   Procedure: ABDOMINAL AORTOGRAM W/LOWER EXTREMITY;  Surgeon: Adrian Prows, MD;  Location: [facility];  Service: Cardiovascular;  Laterality: N/A;   ABDOMINAL HYSTERECTOMY     Cesarean Section     (2)   CHOLECYSTECTOMY     COLONOSCOPY  2020   Gastric Ulcer   2020   PERIPHERAL VASCULAR BALLOON ANGIOPLASTY  02/03/2022   Procedure: PERIPHERAL VASCULAR BALLOON ANGIOPLASTY;  Surgeon: Adrian Prows, MD;  Location: [facility];  Service: Cardiovascular;;   RENAL ANGIOGRAPHY N/A 02/03/2022   Procedure: RENAL ANGIOGRAPHY;  Surgeon: Adrian Prows, MD;  Location: [facility];  Service: Cardiovascular;  Laterality: N/A;   UMBILICAL HERNIA REPAIR N/A 10/29/2020   Procedure: OPEN UMBILICAL HERNIA REPAIR WITH MESH;  Surgeon: Stechschulte, Nickola Major, MD;  Location: [facility];  Service: General;  Laterality: N/A;   Family History  Problem Relation Age of Onset   Colon cancer Mother    Diabetes Father    Diabetes Sister    Breast cancer Sister 32   Hypertension Sister    Social History:  reports that she has been smoking cigarettes. She has a 40.00 pack-year smoking history. She has never used smokeless tobacco. She reports that she does not drink alcohol and does not use drugs.  Allergies:  Allergies  Allergen Reactions   Doxycycline Anaphylaxis   Azilsartan     Other reaction(s): cough   Biaxin [Clarithromycin] Other (See Comments)    Vertigo   Floxacillin [Floxacillin (Flucloxacillin)] Other (See Comments)    Vertigo   Morphine And Related Other (See Comments)    Headache   Medications: I have reviewed the patient's current medications. Prior to Admission:  Medications Prior to Admission  Medication  Sig Dispense Refill Last Dose   acetaminophen (TYLENOL) 500 MG tablet Take 500 mg by mouth 2 (two) times daily as needed for headache or moderate pain.   Past Month   albuterol (VENTOLIN HFA) 108 (90 Base) MCG/ACT inhaler Inhale 2 puffs into the lungs as needed for wheezing or shortness of breath.   5 months   amLODipine (NORVASC) 5 MG tablet Take 10 mg by mouth daily.   10/18/2022   Budeson-Glycopyrrol-Formoterol (BREZTRI AEROSPHERE) 160-9-4.8 MCG/ACT AERO Inhale 2 puffs into the lungs as needed (SOB).   Past Month   furosemide (LASIX) 20 MG tablet  Take 20 mg by mouth daily.   10/17/2022   hydrALAZINE (APRESOLINE) 50 MG tablet TAKE 1 TABLET(50 MG) BY MOUTH THREE TIMES DAILY (Patient taking differently: Take 50 mg by mouth 3 (three) times daily.) 90 tablet 3 10/18/2022   HYDROcodone-acetaminophen (NORCO/VICODIN) 5-325 MG tablet Take 0.5 tablets by mouth 2 (two) times daily as needed for moderate pain.   Past Week   Iron-FA-B Cmp-C-Biot-Probiotic (FUSION PLUS) CAPS Take 1 capsule by mouth daily.   10/18/2022   isosorbide mononitrate (IMDUR) 30 MG 24 hr tablet Take 30 mg by mouth daily.   10/18/2022   labetalol (NORMODYNE) 100 MG tablet Take 200 mg by mouth 3 (three) times daily.   10/18/2022 at 0600   levothyroxine (SYNTHROID) 100 MCG tablet Take 100 mcg by mouth daily before breakfast.   10/18/2022   rosuvastatin (CRESTOR) 40 MG tablet Take 40 mg by mouth daily.   Past Week   valACYclovir (VALTREX) 1000 MG tablet Take 1,000 mg by mouth every 8 (eight) hours.   10/18/2022   aspirin EC 81 MG EC tablet Take 1 tablet (81 mg total) by mouth daily. Swallow whole. (Patient not taking: Reported on 10/19/2022) 30 tablet 11 Not Taking   clopidogrel (PLAVIX) 75 MG tablet Take 1 tablet (75 mg total) by mouth daily with breakfast. (Patient not taking: Reported on 10/19/2022) 30 tablet 0 Not Taking   furosemide (LASIX) 40 MG tablet Take 0.5 tablets (20 mg total) by mouth daily. (Patient not taking: Reported on 10/19/2022)  0 Not Taking   isosorbide dinitrate (ISORDIL) 30 MG tablet Take 1 tablet (30 mg total) by mouth 3 (three) times daily. (Patient not taking: Reported on 10/19/2022) 90 tablet 0 Not Taking   labetalol (NORMODYNE) 200 MG tablet Take 1 tablet (200 mg total) by mouth 2 (two) times daily. 300 MG IN THE AM 200 MG IN THE PM (Patient not taking: Reported on 10/19/2022)   Not Taking   potassium chloride (KLOR-CON) 10 MEQ tablet Take 1 tablet (10 mEq total) by mouth daily as needed. With Furosemide only (Patient not taking: Reported on 10/19/2022) 30  tablet 2 Not Taking   Varenicline Tartrate, Starter, 0.5 MG X 11 & 1 MG X 42 TBPK See admin instructions. (Patient not taking: Reported on 10/19/2022)   Not Taking   Scheduled:  amLODipine  10 mg Oral Daily   atorvastatin  80 mg Oral Daily   cyanocobalamin  1,000 mcg Intramuscular Weekly   furosemide  20 mg Oral Daily   hydrALAZINE  50 mg Oral TID   isosorbide mononitrate  30 mg Oral Daily   labetalol  200 mg Oral QHS   labetalol  300 mg Oral Daily   levothyroxine  100 mcg Oral Q0600   pantoprazole (PROTONIX) IV  40 mg Intravenous Q12H   sodium chloride flush  3 mL Intravenous Q12H   Continuous: [id number] **OR** acetaminophen, albuterol,  enalaprilat, ondansetron **OR** ondansetron (ZOFRAN) IV, senna-docusate  Results for orders placed or performed during the hospital encounter of 10/18/22 (from the past 48 hour(s))  Basic metabolic panel     Status: Abnormal   Collection Time: 10/18/22  7:40 PM  Result Value Ref Range   Sodium 135 135 - 145 mmol/L   Potassium 3.5 3.5 - 5.1 mmol/L   Chloride 106 98 - 111 mmol/L   CO2 22 22 - 32 mmol/L   Glucose, Bld 99 70 - 99 mg/dL    Comment: Glucose reference range applies only to samples taken after fasting for at least 8 hours.   BUN 10 8 - 23 mg/dL   Creatinine, Ser 0.69 0.44 - 1.00 mg/dL   Calcium 8.6 (L) 8.9 - 10.3 mg/dL   GFR, Estimated >60 >60 mL/min    Comment: (NOTE) Calculated using the CKD-EPI Creatinine Equation (2021)    Anion gap 7 5 - 15    Comment: Performed at [facility], [place] [address], [place], [place] [address]  CBC with Differential/Platelet     Status: Abnormal   Collection Time: 10/18/22  7:40 PM  Result Value Ref Range   WBC 8.5 4.0 - 10.5 K/uL   RBC 2.28 (L) 3.87 - 5.11 MIL/uL   Hemoglobin 5.5 (LL) 12.0 - 15.0 g/dL    Comment: REPEATED TO VERIFY THIS CRITICAL RESULT HAS VERIFIED AND BEEN CALLED TO J.HARPER, RN BY NATHAN THOMPSON ON 12 12 2023 AT 2027, AND HAS BEEN READ BACK. CRITICAL  RESULT VERIFIED    HCT 19.0 (L) 36.0 - 46.0 %   MCV 83.3 80.0 - 100.0 fL   MCH 24.1 (L) 26.0 - 34.0 pg   MCHC 28.9 (L) 30.0 - 36.0 g/dL   RDW 18.6 (H) 11.5 - 15.5 %   Platelets 190 150 - 400 K/uL   nRBC 0.0 0.0 - 0.2 %   Neutrophils Relative % 72 %   Neutro Abs 6.2 1.7 - 7.7 K/uL   Lymphocytes Relative 16 %   Lymphs Abs 1.3 0.7 - 4.0 K/uL   Monocytes Relative 9 %   Monocytes Absolute 0.7 0.1 - 1.0 K/uL   Eosinophils Relative 1 %   Eosinophils Absolute 0.1 0.0 - 0.5 K/uL   Basophils Relative 1 %   Basophils Absolute 0.1 0.0 - 0.1 K/uL   Immature Granulocytes 1 %   Abs Immature Granulocytes 0.05 0.00 - 0.07 K/uL    Comment: Performed at [facility], [place] [address] [place], [place] [address]  Type and screen     Status: None (Preliminary result)   Collection Time: 10/18/22  7:50 PM  Result Value Ref Range   ABO/RH(D) O POS    Antibody Screen NEG    Sample Expiration 10/21/2022,2359    Unit Number [id number]    Blood Component Type RED CELLS,LR    Unit division 00    Status of Unit ISSUED    Transfusion Status OK TO TRANSFUSE    Crossmatch Result Compatible    Unit Number [id number]    Blood Component Type RED CELLS,LR    Unit division 00    Status of Unit ISSUED,FINAL    Transfusion Status OK TO TRANSFUSE    Crossmatch Result      Compatible Performed at [facility], [place] [address], [place],  [address]   Prepare RBC (crossmatch)     Status: None   Collection Time: 10/18/22  8:39 PM  Result Value Ref Range  Order Confirmation      ORDER PROCESSED BY BLOOD BANK Performed at [facility], [place] [address] [place], [place] [address]   POC occult blood, ED RN will collect     Status: Abnormal   Collection Time: 10/18/22 10:30 PM  Result Value Ref Range   Fecal Occult Bld POSITIVE (A) NEGATIVE  CBC     Status: Abnormal   Collection Time: 10/19/22  4:48 AM  Result Value Ref Range   WBC 8.8 4.0 -  10.5 K/uL   RBC 3.15 (L) 3.87 - 5.11 MIL/uL   Hemoglobin 8.3 (L) 12.0 - 15.0 g/dL    Comment: POST TRANSFUSION SPECIMEN   HCT 26.4 (L) 36.0 - 46.0 %   MCV 83.8 80.0 - 100.0 fL   MCH 26.3 26.0 - 34.0 pg   MCHC 31.4 30.0 - 36.0 g/dL   RDW 17.1 (H) 11.5 - 15.5 %   Platelets 182 150 - 400 K/uL   nRBC 0.2 0.0 - 0.2 %    Comment: Performed at [facility], [place] [address], [place], [place] [address]  Basic metabolic panel     Status: Abnormal   Collection Time: 10/19/22  4:48 AM  Result Value Ref Range   Sodium 136 135 - 145 mmol/L   Potassium 3.7 3.5 - 5.1 mmol/L   Chloride 106 98 - 111 mmol/L   CO2 22 22 - 32 mmol/L   Glucose, Bld 107 (H) 70 - 99 mg/dL    Comment: Glucose reference range applies only to samples taken after fasting for at least 8 hours.   BUN 10 8 - 23 mg/dL   Creatinine, Ser 0.66 0.44 - 1.00 mg/dL   Calcium 8.8 (L) 8.9 - 10.3 mg/dL   GFR, Estimated >60 >60 mL/min    Comment: (NOTE) Calculated using the CKD-EPI Creatinine Equation (2021)    Anion gap 8 5 - 15    Comment: Performed at [facility], [place] [address] [place], [place] [address]  Vitamin B12     Status: Abnormal   Collection Time: 10/19/22  4:50 AM  Result Value Ref Range   Vitamin B-12 134 (L) 180 - 914 pg/mL    Comment: (NOTE) This assay is not validated for testing neonatal or myeloproliferative syndrome specimens for Vitamin B12 levels. Performed at [facility], [place] [address] [place], [place] [address]   Folate     Status: None   Collection Time: 10/19/22  4:50 AM  Result Value Ref Range   Folate 11.2 >5.9 ng/mL    Comment: Performed at [facility], [place] [address] [place], [place] [address]  Iron and TIBC     Status: Abnormal   Collection Time: 10/19/22  4:50 AM  Result Value Ref Range   Iron 17 (L) 28 - 170 ug/dL   TIBC 494 (H) 250 - 450 ug/dL   Saturation Ratios 3 (L) 10.4 - 31.8 %   UIBC 477 ug/dL     Comment: Performed at [facility], [place] [address], [place], [place] [address]  Ferritin     Status: Abnormal   Collection Time: 10/19/22  4:50 AM  Result Value Ref Range   Ferritin 4 (L) 11 - 307 ng/mL    Comment: Performed at [facility], [place] [address], [place], [place] [address]    DG Chest Port 1 View  Result Date: 10/18/2022 CLINICAL DATA:  Shortness of breath and low hemoglobin. Reports active  shingles outbreak since November 27. EXAM: PORTABLE CHEST 1 VIEW COMPARISON:  Portable chest 02/02/2022. FINDINGS: The heart is moderately enlarged. There is calcification in the aortic arch, stable mediastinum with slight aortic tortuosity. Central vessels are slightly prominent without overt edema findings. There are bilateral minimal pleural effusions and adjacent linear atelectasis in the lateral bases, bilaterally improved from the prior study. No focal pneumonia is evident. Degenerative disc disease and spondylosis thoracic spine again is noted with mild osteopenia. IMPRESSION: 1. Cardiomegaly with slightly prominent central vessels but no overt edema. 2. Bilateral minimal pleural effusions and adjacent linear atelectasis, improved from the prior study. 3. Aortic atherosclerosis. Electronically Signed   By: Telford Nab M.D.   On: 10/18/2022 20:05    Review of Systems  Constitutional:  Positive for activity change and fatigue. Negative for appetite change, chills, diaphoresis, fever and unexpected weight change.  HENT: Negative.    Eyes: Negative.   Respiratory:  Positive for shortness of breath. Negative for apnea, cough, choking, chest tightness, wheezing and stridor.   Gastrointestinal:  Positive for constipation. Negative for abdominal distention, abdominal pain, anal bleeding, diarrhea, nausea and rectal pain.  Endocrine: Negative.   Genitourinary: Negative.   Musculoskeletal:  Positive for arthralgias.  Allergic/Immunologic: Negative.    Neurological: Negative.    Blood pressure (!) 191/56, pulse 67, temperature 98.9 F (37.2 C), temperature source Oral, resp. rate 15, height 4' 11.5" (1.511 m), weight 63 kg, SpO2 93 %. Physical Exam Vitals reviewed.  Constitutional:      Appearance: She is well-developed. She is ill-appearing.     Comments: Patient has a prominent stare secondary to thyroid eye disease  HENT:     Head: Atraumatic.     Mouth/Throat:     Pharynx: Oropharynx is clear.  Cardiovascular:     Rate and Rhythm: Normal rate.  Pulmonary:     Effort: Pulmonary effort is normal.  Abdominal:     General: Bowel sounds are normal.     Palpations: Abdomen is soft.  Musculoskeletal:        General: Normal range of motion.     Cervical back: Neck supple.  Skin:    General: Skin is warm and dry.  Neurological:     General: No focal deficit present.     Mental Status: She is alert.  Psychiatric:        Mood and Affect: Mood normal.   Assessment/Plan: 1) Symptomatic iron deficiency anemia/history of tubular adenomas, family history of colon cancer in a first-degree relative-an EGD and colonoscopy is planned for the patient on Friday, 10/21/2022 she will prep for the colonoscopy tomorrow and will be kept on clear liquids all day. We will allow her to have a small meal tonight.  2) COPD-over 40-pack-year history of smoking. 3) Heart failure with mildly reduced ejection fraction. 4) Abdominal aortic stenosis. 5) Hypertension 6) Hypothyroidism with thyroid eye disease Juanita Craver 10/19/2022, 8:23 AM

## 2022-10-19 NOTE — Assessment & Plan Note (Addendum)
History of suprarenal abdominal aortic stenosis, high-grade, 90-99% treated with shockwave lithotripsy, balloon angioplasty 02/03/2022.   Patient states she only took aspirin and Plavix for 3 weeks afterwards and is not current on any chronic antiplatelet therapy Patient follows with Dr. Einar Gip

## 2022-10-19 NOTE — Assessment & Plan Note (Addendum)
No clinical evidence of cardiogenic volume overload Strict input and output monitoring Daily weights Low-sodium diet  

## 2022-10-19 NOTE — Assessment & Plan Note (Addendum)
Hemoglobin remains stable Continuing to monitor hemoglobin and hematocrit with serial CBCs GI following, patient receiving bowel prep and to be n.p.o. after midnight for upper endoscopy and colonoscopy tomorrow Continuing intravenous Protonix Further transfusions for hemoglobin of less than 7

## 2022-10-19 NOTE — Assessment & Plan Note (Addendum)
   No evidence of COPD exacerbation this time  Continue home regimen of maintenance inhalers  As needed bronchodilator therapy for episodic shortness of breath and wheezing.  

## 2022-10-19 NOTE — Progress Notes (Signed)
PROGRESS NOTE   Gwendolyn [name]  ZOX:[id number] DOB: 1956/03/12 DOA: 10/18/2022 PCP: Jilda Panda, MD   Date of Service: the patient was seen and examined on 10/19/2022  Brief Narrative:  Gwendolyn [name] is a 66 y.o. female with medical history significant for chronic diastolic CHF (EF 81-19%), COPD, suprarenal abdominal aortic stenosis, anemia, HTN, hypothyroidism, anxiety who is admitted with symptomatic anemia.  Hemoglobin 5.5, FOBT positive.  2 units of packed red blood cells were transfused shortly after hospitalization.   Assessment and Plan: * Symptomatic anemia Hemoglobin 5.5 on admission.   FOBT is positive.   Patient denies any melena or gross bleedin Monitoring hemoglobin and hematocrit with serial CBCs Treating with Protonix 40 mg IV every 12hrs Dr. Collene Mares with gastroenterology consulted who will evaluate patient later today, their input is appreciated I do not anticipate the patient will undergo any endoscopic intervention today.  Will go ahead and feed the patient while we await recommendations. Follow-up posttransfusion CBC  Heart failure with mildly reduced ejection fraction (HFmrEF) (HCC) No clinical evidence of cardiogenic volume overload Strict input and output monitoring Daily weights Low-sodium diet   COPD (chronic obstructive pulmonary disease) (HCC) No evidence of COPD exacerbation this time Continue home regimen of maintenance inhalers As needed bronchodilator therapy for episodic shortness of breath and wheezing.   Abdominal aortic stenosis History of suprarenal abdominal aortic stenosis, high-grade, 90-99% treated with shockwave lithotripsy, balloon angioplasty 02/03/2022.   Patient states she only took aspirin and Plavix for 3 weeks afterwards and is not current on any chronic antiplatelet therapy Patient follows with Dr. Einar Gip  Essential hypertension Patient reports that she has been instructed that she needs a target systolic blood pressure  of 160 Resuming home hydralazine, labetalol, amlodipine, Isordil As needed intravenous antihypertensives for markedly elevated blood pressure.  Hypothyroidism Continue Synthroid.        Subjective:  Patient states that her fatigue she presented with is improved.  Patient denies any associated shortness of breath or chest pain.  Patient denies any episodes of gross bleeding.  Physical Exam:  Vitals:   10/19/22 1510 10/19/22 1539 10/19/22 1753 10/19/22 2040  BP: (!) 199/57 (!) 201/57 (!) 173/56 (!) 187/47  Pulse:  78 76 72  Resp:  '18 18 20  '$ Temp:  98.6 F (37 C) 98.4 F (36.9 C) 98.1 F (36.7 C)  TempSrc:  Oral  Oral  SpO2:  95% 99% 100%  Weight:      Height:         Constitutional: Awake alert and oriented x3, no associated distress.   Skin: no rashes, no lesions, good skin turgor noted. Eyes: Pupils are equally reactive to light.  No evidence of scleral icterus or conjunctival pallor.  ENMT: Moist mucous membranes noted.  Posterior pharynx clear of any exudate or lesions.   Respiratory: clear to auscultation bilaterally, no wheezing, no crackles. Normal respiratory effort. No accessory muscle use.  Cardiovascular: Regular rate and rhythm, no murmurs / rubs / gallops. No extremity edema. 2+ pedal pulses. No carotid bruits.  Abdomen: Abdomen is soft and nontender.  No evidence of intra-abdominal masses.  Positive bowel sounds noted in all quadrants.   Musculoskeletal: No joint deformity upper and lower extremities. Good ROM, no contractures. Normal muscle tone.    Data Reviewed:  I have personally reviewed and interpreted labs, imaging.  Significant findings are   CBC: Recent Labs  Lab 10/18/22 1940 10/19/22 0448  WBC 8.5 8.8  NEUTROABS 6.2  --  HGB 5.5* 8.3*  HCT 19.0* 26.4*  MCV 83.3 83.8  PLT 190 086   Basic Metabolic Panel: Recent Labs  Lab 10/18/22 1940 10/19/22 0448  NA 135 136  K 3.5 3.7  CL 106 106  CO2 22 22  GLUCOSE 99 107*  BUN 10 10   CREATININE 0.69 0.66  CALCIUM 8.6* 8.8*      Code Status: Partial.  Patient wants chest compressions but does not want intubation.    Severity of Illness:  The appropriate patient status for this patient is OBSERVATION. Observation status is judged to be reasonable and necessary in order to provide the required intensity of service to ensure the patient's safety. The patient's presenting symptoms, physical exam findings, and initial radiographic and laboratory data in the context of their medical condition is felt to place them at decreased risk for further clinical deterioration. Furthermore, it is anticipated that the patient will be medically stable for discharge from the hospital within 2 midnights of admission.   Time spent:  55 minutes  Author:  Vernelle Emerald MD  10/19/2022 8:52 PM

## 2022-10-19 NOTE — Assessment & Plan Note (Addendum)
Continue Synthroid °

## 2022-10-19 NOTE — Hospital Course (Addendum)
Gwendolyn [name] is a 66 y.o. female with medical history significant for chronic diastolic CHF (EF 16-10%), COPD, suprarenal abdominal aortic stenosis, anemia, HTN, hypothyroidism, anxiety who presented to [facility] emergency department complaints of severe weakness.   Upon evaluation in the emergency department patient was found to have a markedly low hemoglobin at 5.5, FOBT positive.  Due to concerns over gastrointestinal bleeding the hospitalist group was then called to assess the patient for admission to the hospital.  2 units of packed red blood cells were transfused.  Patient exhibited no episodes of bleeding throughout hospitalization.  Dr.Hung/Mann were consulted who performed a bowel prep followed by upper endoscopy and colonoscopy on 12/15.  Of note, during the patient's anemia workup patient was found to have vitamin B12 deficiency of 134.  2 separate vitamin B12 intramuscular injections were given throughout the hospitalization.  At time of discharge, arrangements were made for patient to receive 1000 mcg of vitamin B12 orally daily with the intent for patient to have repeat vitamin B12 level obtained by her primary care provider in the next 6 to 8 weeks.  On 12/15 upper endoscopy revealed erosions consistent with erosive gastropathy with no evidence of active bleeding.  Colonoscopy performed on 12/15 revealed 5 sessile polyps that were successfully removed via cold snare with no evidence of active bleeding.  Considering these results patient's substantial anemia was felt to be secondary to extremely slow chronic gastrointestinal bleeding, possibly from the erosive gastropathy.  Patient was initiated on a regimen of Protonix 40 mg by mouth twice daily for a minimum of 1 month followed by 40 mg by mouth once daily afterwards.  Is also worth noting the patient exhibited markedly elevated blood pressures throughout the hospitalization.  To address this patient's regimen of  labetalol was increased to 400 mg twice daily.  Additionally, patient was placed on lisinopril 40 mg by mouth once daily.  This is in addition to patient's usual regimen of hydralazine 50 mg 3 times daily and amlodipine 10 mg daily.  Patient was administered a Feraheme infusion prior to discharge.  Patient was started in improved and stable condition the afternoon of 12/15.

## 2022-10-19 NOTE — Assessment & Plan Note (Addendum)
Patient reports that she has been instructed that she needs a target systolic blood pressure of 160 Resuming home hydralazine, labetalol, amlodipine, Isordil As needed intravenous antihypertensives for markedly elevated blood pressure.

## 2022-10-20 ENCOUNTER — Other Ambulatory Visit: Payer: Self-pay | Admitting: Gastroenterology

## 2022-10-20 DIAGNOSIS — I5022 Chronic systolic (congestive) heart failure: Secondary | ICD-10-CM | POA: Diagnosis not present

## 2022-10-20 DIAGNOSIS — E039 Hypothyroidism, unspecified: Secondary | ICD-10-CM | POA: Diagnosis not present

## 2022-10-20 DIAGNOSIS — Q251 Coarctation of aorta: Secondary | ICD-10-CM

## 2022-10-20 DIAGNOSIS — J449 Chronic obstructive pulmonary disease, unspecified: Secondary | ICD-10-CM | POA: Diagnosis not present

## 2022-10-20 DIAGNOSIS — D649 Anemia, unspecified: Secondary | ICD-10-CM | POA: Diagnosis not present

## 2022-10-20 LAB — TYPE AND SCREEN
ABO/RH(D): O POS
Antibody Screen: NEGATIVE
Unit division: 0
Unit division: 0

## 2022-10-20 LAB — CBC WITH DIFFERENTIAL/PLATELET
Abs Immature Granulocytes: 0.05 10*3/uL (ref 0.00–0.07)
Basophils Absolute: 0.1 10*3/uL (ref 0.0–0.1)
Basophils Relative: 1 %
Eosinophils Absolute: 0.1 10*3/uL (ref 0.0–0.5)
Eosinophils Relative: 2 %
HCT: 26.9 % — ABNORMAL LOW (ref 36.0–46.0)
Hemoglobin: 8.4 g/dL — ABNORMAL LOW (ref 12.0–15.0)
Immature Granulocytes: 1 %
Lymphocytes Relative: 20 %
Lymphs Abs: 1.5 10*3/uL (ref 0.7–4.0)
MCH: 26.2 pg (ref 26.0–34.0)
MCHC: 31.2 g/dL (ref 30.0–36.0)
MCV: 83.8 fL (ref 80.0–100.0)
Monocytes Absolute: 0.9 10*3/uL (ref 0.1–1.0)
Monocytes Relative: 12 %
Neutro Abs: 5.2 10*3/uL (ref 1.7–7.7)
Neutrophils Relative %: 64 %
Platelets: 182 10*3/uL (ref 150–400)
RBC: 3.21 MIL/uL — ABNORMAL LOW (ref 3.87–5.11)
RDW: 17.4 % — ABNORMAL HIGH (ref 11.5–15.5)
WBC: 7.9 10*3/uL (ref 4.0–10.5)
nRBC: 0 % (ref 0.0–0.2)

## 2022-10-20 LAB — COMPREHENSIVE METABOLIC PANEL
ALT: 11 U/L (ref 0–44)
AST: 16 U/L (ref 15–41)
Albumin: 3.4 g/dL — ABNORMAL LOW (ref 3.5–5.0)
Alkaline Phosphatase: 45 U/L (ref 38–126)
Anion gap: 9 (ref 5–15)
BUN: 16 mg/dL (ref 8–23)
CO2: 24 mmol/L (ref 22–32)
Calcium: 8.8 mg/dL — ABNORMAL LOW (ref 8.9–10.3)
Chloride: 105 mmol/L (ref 98–111)
Creatinine, Ser: 0.84 mg/dL (ref 0.44–1.00)
GFR, Estimated: >60 mL/min (ref >60)
Glucose, Bld: 98 mg/dL (ref 70–99)
Potassium: 3.4 mmol/L — ABNORMAL LOW (ref 3.5–5.1)
Sodium: 138 mmol/L (ref 135–145)
Total Bilirubin: 0.7 mg/dL (ref 0.3–1.2)
Total Protein: 5.8 g/dL — ABNORMAL LOW (ref 6.5–8.1)

## 2022-10-20 LAB — BPAM RBC
Blood Product Expiration Date: 202401112359
Blood Product Expiration Date: 202401112359
ISSUE DATE / TIME: 202312122236
ISSUE DATE / TIME: 202312130127
Unit Type and Rh: 5100
Unit Type and Rh: 5100

## 2022-10-20 LAB — MAGNESIUM: Magnesium: 2 mg/dL (ref 1.7–2.4)

## 2022-10-20 MED ORDER — POTASSIUM CHLORIDE CRYS ER 20 MEQ PO TBCR
40.0000 meq | EXTENDED_RELEASE_TABLET | Freq: Once | ORAL | Status: AC
  Administered 2022-10-20: 40 meq via ORAL
  Filled 2022-10-20: qty 2

## 2022-10-20 MED ORDER — PEG 3350-KCL-NA BICARB-NACL 420 G PO SOLR
4000.0000 mL | Freq: Once | ORAL | Status: AC
  Administered 2022-10-20: 4000 mL via ORAL

## 2022-10-20 NOTE — Progress Notes (Signed)
Mobility Specialist - Progress Note   10/20/22 0921  Mobility  Activity Ambulated with assistance in hallway  Level of Assistance Independent  Assistive Device None  Distance Ambulated (ft) 500 ft  Activity Response Tolerated well  Mobility Referral Yes  $Mobility charge 1 Mobility   Pt received in bench and agreed to mobility, had no issues during session. Pt IND and will sign off if she continues to ambulate in halls on her own. Pt returned to bench with all needs met.  Roderick Pee Mobility Specialist

## 2022-10-20 NOTE — Progress Notes (Addendum)
PROGRESS NOTE   Gwendolyn [name]  QQI:[id number] DOB: November 05, 1956 DOA: 10/18/2022 PCP: Jilda Panda, MD   Date of Service: the patient was seen and examined on 10/20/2022  Brief Narrative:  Gwendolyn [name] is a 66 y.o. female with medical history significant for chronic diastolic CHF (EF 94-17%), COPD, suprarenal abdominal aortic stenosis, anemia, HTN, hypothyroidism, anxiety who is admitted with symptomatic anemia.  Hemoglobin 5.5, FOBT positive.  2 units of packed red blood cells were transfused shortly after hospitalization.   Assessment and Plan: * Symptomatic anemia Hemoglobin remains stable Continuing to monitor hemoglobin and hematocrit with serial CBCs GI following, patient receiving bowel prep and to be n.p.o. after midnight for upper endoscopy and colonoscopy tomorrow Continuing intravenous Protonix Further transfusions for hemoglobin of less than 7  Heart failure with mildly reduced ejection fraction (HFmrEF) (HCC) No clinical evidence of cardiogenic volume overload Strict input and output monitoring Daily weights Low-sodium diet   COPD (chronic obstructive pulmonary disease) (HCC) No evidence of COPD exacerbation this time Continue home regimen of maintenance inhalers As needed bronchodilator therapy for episodic shortness of breath and wheezing.   Abdominal aortic stenosis History of suprarenal abdominal aortic stenosis, high-grade, 90-99% treated with shockwave lithotripsy, balloon angioplasty 02/03/2022.   Patient states she only took aspirin and Plavix for 3 weeks afterwards and is not current on any chronic antiplatelet therapy Patient follows with Dr. Einar Gip  Essential hypertension Patient reports that she has been instructed that she needs a target systolic blood pressure of 160 Resuming home hydralazine, labetalol, amlodipine, Isordil As needed intravenous antihypertensives for markedly elevated blood pressure.  Hypothyroidism Continue  Synthroid.        Subjective:  Patient denies abdominal pain or any episodes of frank bright red blood per rectum or melena.  Physical Exam:  Vitals:   10/19/22 1753 10/19/22 2040 10/20/22 0211 10/20/22 1435  BP: (!) 173/56 (!) 187/47 (!) 167/53 (!) 174/61  Pulse: 76 72 74 69  Resp: '18 20 16 17  '$ Temp: 98.4 F (36.9 C) 98.1 F (36.7 C) 99.5 F (37.5 C) 99 F (37.2 C)  TempSrc:  Oral Oral   SpO2: 99% 100% 97% 99%  Weight:      Height:        Constitutional: Awake alert and oriented x3, no associated distress.   Skin: no rashes, no lesions, good skin turgor noted. Eyes: Pupils are equally reactive to light.  No evidence of scleral icterus or conjunctival pallor.  ENMT: Moist mucous membranes noted.  Posterior pharynx clear of any exudate or lesions.   Respiratory: clear to auscultation bilaterally, no wheezing, no crackles. Normal respiratory effort. No accessory muscle use.  Cardiovascular: Regular rate and rhythm, no murmurs / rubs / gallops. No extremity edema. 2+ pedal pulses. No carotid bruits.  Abdomen: Abdomen is soft and nontender.  No evidence of intra-abdominal masses.  Positive bowel sounds noted in all quadrants.   Musculoskeletal: No joint deformity upper and lower extremities. Good ROM, no contractures. Normal muscle tone.    Data Reviewed:  I have personally reviewed and interpreted labs, imaging.  Significant findings are   CBC: Recent Labs  Lab 10/18/22 1940 10/19/22 0448 10/20/22 0508  WBC 8.5 8.8 7.9  NEUTROABS 6.2  --  5.2  HGB 5.5* 8.3* 8.4*  HCT 19.0* 26.4* 26.9*  MCV 83.3 83.8 83.8  PLT 190 182 408   Basic Metabolic Panel: Recent Labs  Lab 10/18/22 1940 10/19/22 0448 10/20/22 0508  NA 135 136 138  K  3.5 3.7 3.4*  CL 106 106 105  CO2 '22 22 24  '$ GLUCOSE 99 107* 98  BUN '10 10 16  '$ CREATININE 0.69 0.66 0.84  CALCIUM 8.6* 8.8* 8.8*  MG  --   --  2.0   GFR: Estimated Creatinine Clearance: 53.2 mL/min (by C-G formula based on SCr  of 0.84 mg/dL). Liver Function Tests: Recent Labs  Lab 10/20/22 0508  AST 16  ALT 11  ALKPHOS 45  BILITOT 0.7  PROT 5.8*  ALBUMIN 3.4*     Code Status:  Limited code.  Code status decision has been confirmed with: patient   Time spent:  34 minutes  Author:  Vernelle Emerald MD  10/20/2022 9:14 PM

## 2022-10-20 NOTE — H&P (View-Only) (Signed)
Subjective: Feeling better after the transfusions.  Objective: Vital signs in last 24 hours: Temp:  [98.1 F (36.7 C)-99.5 F (37.5 C)] 99 F (37.2 C) (12/14 1435) Pulse Rate:  [69-78] 69 (12/14 1435) Resp:  [16-20] 17 (12/14 1435) BP: (167-201)/(47-61) 174/61 (12/14 1435) SpO2:  [95 %-100 %] 99 % (12/14 1435) Last BM Date : 10/19/22  Intake/Output from previous day: 12/13 0701 - 12/14 0700 In: 490 [P.O.:480; I.V.:10] Out: 1 [Stool:1] Intake/Output this shift: No intake/output data recorded.  General appearance: alert and no distress GI: soft, non-tender; bowel sounds normal; no masses,  no organomegaly  Lab Results: Recent Labs    10/18/22 1940 10/19/22 0448 10/20/22 0508  WBC 8.5 8.8 7.9  HGB 5.5* 8.3* 8.4*  HCT 19.0* 26.4* 26.9*  PLT 190 182 182   BMET Recent Labs    10/18/22 1940 10/19/22 0448 10/20/22 0508  NA 135 136 138  K 3.5 3.7 3.4*  CL 106 106 105  CO2 '22 22 24  '$ GLUCOSE 99 107* 98  BUN '10 10 16  '$ CREATININE 0.69 0.66 0.84  CALCIUM 8.6* 8.8* 8.8*   LFT Recent Labs    10/20/22 0508  PROT 5.8*  ALBUMIN 3.4*  AST 16  ALT 11  ALKPHOS 45  BILITOT 0.7   PT/INR No results for input(s): "LABPROT", "INR" in the last 72 hours. Hepatitis Panel No results for input(s): "HEPBSAG", "HCVAB", "HEPAIGM", "HEPBIGM" in the last 72 hours. C-Diff No results for input(s): "CDIFFTOX" in the last 72 hours. Fecal Lactopherrin No results for input(s): "FECLLACTOFRN" in the last 72 hours.  Studies/Results: DG Chest Port 1 View  Result Date: 10/18/2022 CLINICAL DATA:  Shortness of breath and low hemoglobin. Reports active shingles outbreak since November 27. EXAM: PORTABLE CHEST 1 VIEW COMPARISON:  Portable chest 02/02/2022. FINDINGS: The heart is moderately enlarged. There is calcification in the aortic arch, stable mediastinum with slight aortic tortuosity. Central vessels are slightly prominent without overt edema findings. There are bilateral minimal  pleural effusions and adjacent linear atelectasis in the lateral bases, bilaterally improved from the prior study. No focal pneumonia is evident. Degenerative disc disease and spondylosis thoracic spine again is noted with mild osteopenia. IMPRESSION: 1. Cardiomegaly with slightly prominent central vessels but no overt edema. 2. Bilateral minimal pleural effusions and adjacent linear atelectasis, improved from the prior study. 3. Aortic atherosclerosis. Electronically Signed   By: Telford Nab M.D.   On: 10/18/2022 20:05    Medications: Scheduled:  amLODipine  10 mg Oral Daily   atorvastatin  80 mg Oral Daily   cyanocobalamin  1,000 mcg Intramuscular Weekly   furosemide  20 mg Oral Daily   hydrALAZINE  50 mg Oral TID   isosorbide mononitrate  30 mg Oral Daily   labetalol  200 mg Oral QHS   labetalol  300 mg Oral Daily   levothyroxine  100 mcg Oral Q0600   nicotine  21 mg Transdermal Daily   pantoprazole (PROTONIX) IV  40 mg Intravenous Q12H   polyethylene glycol-electrolytes  4,000 mL Oral Once   sodium chloride flush  3 mL Intravenous Q12H   Continuous:  Assessment/Plan: 1) IDA. 2) History of PUD. 3) COPD.   She is better with the blood transfusions.  Her HGB is in the 8 range.  Plan: 1) EGD/colonoscopy tomorrow.  LOS: 0 days   Gwendolyn [name] D 10/20/2022, 2:59 PM

## 2022-10-20 NOTE — Plan of Care (Signed)

## 2022-10-20 NOTE — Progress Notes (Signed)
Subjective: Feeling better after the transfusions.  Objective: Vital signs in last 24 hours: Temp:  [98.1 F (36.7 C)-99.5 F (37.5 C)] 99 F (37.2 C) (12/14 1435) Pulse Rate:  [69-78] 69 (12/14 1435) Resp:  [16-20] 17 (12/14 1435) BP: (167-201)/(47-61) 174/61 (12/14 1435) SpO2:  [95 %-100 %] 99 % (12/14 1435) Last BM Date : 10/19/22  Intake/Output from previous day: 12/13 0701 - 12/14 0700 In: 490 [P.O.:480; I.V.:10] Out: 1 [Stool:1] Intake/Output this shift: No intake/output data recorded.  General appearance: alert and no distress GI: soft, non-tender; bowel sounds normal; no masses,  no organomegaly  Lab Results: Recent Labs    10/18/22 1940 10/19/22 0448 10/20/22 0508  WBC 8.5 8.8 7.9  HGB 5.5* 8.3* 8.4*  HCT 19.0* 26.4* 26.9*  PLT 190 182 182   BMET Recent Labs    10/18/22 1940 10/19/22 0448 10/20/22 0508  NA 135 136 138  K 3.5 3.7 3.4*  CL 106 106 105  CO2 '22 22 24  '$ GLUCOSE 99 107* 98  BUN '10 10 16  '$ CREATININE 0.69 0.66 0.84  CALCIUM 8.6* 8.8* 8.8*   LFT Recent Labs    10/20/22 0508  PROT 5.8*  ALBUMIN 3.4*  AST 16  ALT 11  ALKPHOS 45  BILITOT 0.7   PT/INR No results for input(s): "LABPROT", "INR" in the last 72 hours. Hepatitis Panel No results for input(s): "HEPBSAG", "HCVAB", "HEPAIGM", "HEPBIGM" in the last 72 hours. C-Diff No results for input(s): "CDIFFTOX" in the last 72 hours. Fecal Lactopherrin No results for input(s): "FECLLACTOFRN" in the last 72 hours.  Studies/Results: DG Chest Port 1 View  Result Date: 10/18/2022 CLINICAL DATA:  Shortness of breath and low hemoglobin. Reports active shingles outbreak since November 27. EXAM: PORTABLE CHEST 1 VIEW COMPARISON:  Portable chest 02/02/2022. FINDINGS: The heart is moderately enlarged. There is calcification in the aortic arch, stable mediastinum with slight aortic tortuosity. Central vessels are slightly prominent without overt edema findings. There are bilateral minimal  pleural effusions and adjacent linear atelectasis in the lateral bases, bilaterally improved from the prior study. No focal pneumonia is evident. Degenerative disc disease and spondylosis thoracic spine again is noted with mild osteopenia. IMPRESSION: 1. Cardiomegaly with slightly prominent central vessels but no overt edema. 2. Bilateral minimal pleural effusions and adjacent linear atelectasis, improved from the prior study. 3. Aortic atherosclerosis. Electronically Signed   By: Telford Nab M.D.   On: 10/18/2022 20:05    Medications: Scheduled:  amLODipine  10 mg Oral Daily   atorvastatin  80 mg Oral Daily   cyanocobalamin  1,000 mcg Intramuscular Weekly   furosemide  20 mg Oral Daily   hydrALAZINE  50 mg Oral TID   isosorbide mononitrate  30 mg Oral Daily   labetalol  200 mg Oral QHS   labetalol  300 mg Oral Daily   levothyroxine  100 mcg Oral Q0600   nicotine  21 mg Transdermal Daily   pantoprazole (PROTONIX) IV  40 mg Intravenous Q12H   polyethylene glycol-electrolytes  4,000 mL Oral Once   sodium chloride flush  3 mL Intravenous Q12H   Continuous:  Assessment/Plan: 1) IDA. 2) History of PUD. 3) COPD.   She is better with the blood transfusions.  Her HGB is in the 8 range.  Plan: 1) EGD/colonoscopy tomorrow.  LOS: 0 days   Raynette Arras D 10/20/2022, 2:59 PM

## 2022-10-20 NOTE — Progress Notes (Signed)
  Transition of Care [facility]) Screening Note   Patient Details  Name: Gwendolyn [name] Date of Birth: 29-Aug-1956   Transition of Care [facility]) CM/SW Contact:    Vassie Moselle, LCSW Phone Number: 10/20/2022, 10:06 AM    Transition of Care Department [facility]) has reviewed patient and no TOC needs have been identified at this time. We will continue to monitor patient advancement through interdisciplinary progression rounds. If new patient transition needs arise, please place a TOC consult.

## 2022-10-21 ENCOUNTER — Observation Stay (HOSPITAL_COMMUNITY): Payer: Medicare Other | Admitting: Certified Registered"

## 2022-10-21 ENCOUNTER — Encounter (HOSPITAL_COMMUNITY)
Admission: EM | Disposition: A | Discharge status: Home / Self Care | Payer: Self-pay | Source: Home / Self Care | Attending: Emergency Medicine

## 2022-10-21 ENCOUNTER — Other Ambulatory Visit (HOSPITAL_COMMUNITY): Payer: Self-pay

## 2022-10-21 ENCOUNTER — Encounter (HOSPITAL_COMMUNITY): Payer: Self-pay | Admitting: Internal Medicine

## 2022-10-21 ENCOUNTER — Observation Stay (HOSPITAL_BASED_OUTPATIENT_CLINIC_OR_DEPARTMENT_OTHER): Payer: Medicare Other | Admitting: Certified Registered"

## 2022-10-21 DIAGNOSIS — F17210 Nicotine dependence, cigarettes, uncomplicated: Secondary | ICD-10-CM

## 2022-10-21 DIAGNOSIS — J449 Chronic obstructive pulmonary disease, unspecified: Secondary | ICD-10-CM | POA: Diagnosis not present

## 2022-10-21 DIAGNOSIS — I10 Essential (primary) hypertension: Secondary | ICD-10-CM

## 2022-10-21 DIAGNOSIS — E039 Hypothyroidism, unspecified: Secondary | ICD-10-CM | POA: Diagnosis not present

## 2022-10-21 DIAGNOSIS — D509 Iron deficiency anemia, unspecified: Secondary | ICD-10-CM

## 2022-10-21 DIAGNOSIS — I5022 Chronic systolic (congestive) heart failure: Secondary | ICD-10-CM | POA: Diagnosis not present

## 2022-10-21 DIAGNOSIS — K3189 Other diseases of stomach and duodenum: Secondary | ICD-10-CM | POA: Diagnosis not present

## 2022-10-21 DIAGNOSIS — E538 Deficiency of other specified B group vitamins: Secondary | ICD-10-CM

## 2022-10-21 DIAGNOSIS — D123 Benign neoplasm of transverse colon: Secondary | ICD-10-CM

## 2022-10-21 DIAGNOSIS — D649 Anemia, unspecified: Secondary | ICD-10-CM | POA: Diagnosis not present

## 2022-10-21 HISTORY — PX: ESOPHAGOGASTRODUODENOSCOPY (EGD) WITH PROPOFOL: SHX5813

## 2022-10-21 HISTORY — PX: POLYPECTOMY: SHX5525

## 2022-10-21 HISTORY — PX: COLONOSCOPY WITH PROPOFOL: SHX5780

## 2022-10-21 HISTORY — PX: BIOPSY: SHX5522

## 2022-10-21 LAB — COMPREHENSIVE METABOLIC PANEL
ALT: 15 U/L (ref 0–44)
AST: 18 U/L (ref 15–41)
Albumin: 4.2 g/dL (ref 3.5–5.0)
Alkaline Phosphatase: 49 U/L (ref 38–126)
Anion gap: 9 (ref 5–15)
BUN: 10 mg/dL (ref 8–23)
CO2: 27 mmol/L (ref 22–32)
Calcium: 9.1 mg/dL (ref 8.9–10.3)
Chloride: 104 mmol/L (ref 98–111)
Creatinine, Ser: 0.77 mg/dL (ref 0.44–1.00)
GFR, Estimated: >60 mL/min (ref >60)
Glucose, Bld: 97 mg/dL (ref 70–99)
Potassium: 3.9 mmol/L (ref 3.5–5.1)
Sodium: 140 mmol/L (ref 135–145)
Total Bilirubin: 0.7 mg/dL (ref 0.3–1.2)
Total Protein: 6.8 g/dL (ref 6.5–8.1)

## 2022-10-21 LAB — CBC WITH DIFFERENTIAL/PLATELET
Abs Immature Granulocytes: 0.03 10*3/uL (ref 0.00–0.07)
Basophils Absolute: 0.1 10*3/uL (ref 0.0–0.1)
Basophils Relative: 2 %
Eosinophils Absolute: 0.2 10*3/uL (ref 0.0–0.5)
Eosinophils Relative: 3 %
HCT: 31.3 % — ABNORMAL LOW (ref 36.0–46.0)
Hemoglobin: 9.7 g/dL — ABNORMAL LOW (ref 12.0–15.0)
Immature Granulocytes: 0 %
Lymphocytes Relative: 21 %
Lymphs Abs: 1.5 10*3/uL (ref 0.7–4.0)
MCH: 26.2 pg (ref 26.0–34.0)
MCHC: 31 g/dL (ref 30.0–36.0)
MCV: 84.6 fL (ref 80.0–100.0)
Monocytes Absolute: 1 10*3/uL (ref 0.1–1.0)
Monocytes Relative: 13 %
Neutro Abs: 4.6 10*3/uL (ref 1.7–7.7)
Neutrophils Relative %: 61 %
Platelets: 194 10*3/uL (ref 150–400)
RBC: 3.7 MIL/uL — ABNORMAL LOW (ref 3.87–5.11)
RDW: 17.3 % — ABNORMAL HIGH (ref 11.5–15.5)
WBC: 7.4 10*3/uL (ref 4.0–10.5)
nRBC: 0 % (ref 0.0–0.2)

## 2022-10-21 LAB — MAGNESIUM: Magnesium: 2.3 mg/dL (ref 1.7–2.4)

## 2022-10-21 SURGERY — ESOPHAGOGASTRODUODENOSCOPY (EGD) WITH PROPOFOL
Anesthesia: Monitor Anesthesia Care

## 2022-10-21 MED ORDER — LABETALOL HCL 200 MG PO TABS
400.0000 mg | ORAL_TABLET | Freq: Two times a day (BID) | ORAL | 1 refills | Status: DC
  Filled 2022-10-21: qty 120, 30d supply, fill #0

## 2022-10-21 MED ORDER — PANTOPRAZOLE SODIUM 40 MG PO TBEC: 40.0000 mg | DELAYED_RELEASE_TABLET | Freq: Every day | ORAL | Status: DC

## 2022-10-21 MED ORDER — PROPOFOL 500 MG/50ML IV EMUL
INTRAVENOUS | Status: AC
  Filled 2022-10-21: qty 50

## 2022-10-21 MED ORDER — LACTATED RINGERS IV SOLN: INTRAVENOUS | Status: DC

## 2022-10-21 MED ORDER — LABETALOL HCL 200 MG PO TABS: 400.0000 mg | ORAL_TABLET | Freq: Two times a day (BID) | ORAL | 1 refills | Status: DC

## 2022-10-21 MED ORDER — FERROUS SULFATE 325 (65 FE) MG PO TABS: 325.0000 mg | ORAL_TABLET | Freq: Every day | ORAL | 3 refills | Status: DC

## 2022-10-21 MED ORDER — LISINOPRIL 40 MG PO TABS: 40.0000 mg | ORAL_TABLET | Freq: Every day | ORAL | 1 refills | Status: DC

## 2022-10-21 MED ORDER — CYANOCOBALAMIN 1000 MCG/ML IJ SOLN
1000.0000 ug | Freq: Once | INTRAMUSCULAR | Status: AC
  Administered 2022-10-21: 1000 ug via INTRAMUSCULAR
  Filled 2022-10-21: qty 1

## 2022-10-21 MED ORDER — AMLODIPINE BESYLATE 5 MG PO TABS
10.0000 mg | ORAL_TABLET | Freq: Every day | ORAL | 1 refills | Status: DC
  Filled 2022-10-21: qty 30, 15d supply, fill #0

## 2022-10-21 MED ORDER — PANTOPRAZOLE SODIUM 40 MG PO TBEC
DELAYED_RELEASE_TABLET | ORAL | 0 refills | Status: AC
  Filled 2022-10-21: qty 90, 60d supply, fill #0

## 2022-10-21 MED ORDER — PROPOFOL 10 MG/ML IV BOLUS
INTRAVENOUS | Status: DC | PRN
  Administered 2022-10-21: 20 mg via INTRAVENOUS

## 2022-10-21 MED ORDER — PANTOPRAZOLE SODIUM 40 MG PO TBEC: DELAYED_RELEASE_TABLET | ORAL | 0 refills | Status: DC

## 2022-10-21 MED ORDER — PROPOFOL 1000 MG/100ML IV EMUL
INTRAVENOUS | Status: AC
  Filled 2022-10-21: qty 100

## 2022-10-21 MED ORDER — PROPOFOL 500 MG/50ML IV EMUL
INTRAVENOUS | Status: DC | PRN
  Administered 2022-10-21: 135 ug/kg/min via INTRAVENOUS

## 2022-10-21 MED ORDER — LISINOPRIL 20 MG PO TABS
40.0000 mg | ORAL_TABLET | Freq: Every day | ORAL | Status: DC
  Administered 2022-10-21: 40 mg via ORAL
  Filled 2022-10-21: qty 2

## 2022-10-21 MED ORDER — HYDRALAZINE HCL 50 MG PO TABS
50.0000 mg | ORAL_TABLET | Freq: Three times a day (TID) | ORAL | 1 refills | Status: DC
  Filled 2022-10-21: qty 90, 30d supply, fill #0

## 2022-10-21 MED ORDER — VITAMIN B-12 1000 MCG PO TABS
1000.0000 ug | ORAL_TABLET | Freq: Every day | ORAL | 1 refills | Status: DC
  Filled 2022-10-21: qty 30, 30d supply, fill #0

## 2022-10-21 MED ORDER — SODIUM CHLORIDE 0.9 % IV SOLN: INTRAVENOUS | Status: DC

## 2022-10-21 MED ORDER — LISINOPRIL 40 MG PO TABS
40.0000 mg | ORAL_TABLET | Freq: Every day | ORAL | 1 refills | Status: DC
  Filled 2022-10-21: qty 30, 30d supply, fill #0

## 2022-10-21 MED ORDER — SODIUM CHLORIDE 0.9 % IV SOLN
510.0000 mg | Freq: Once | INTRAVENOUS | Status: AC
  Administered 2022-10-21: 510 mg via INTRAVENOUS
  Filled 2022-10-21: qty 17

## 2022-10-21 MED ORDER — LABETALOL HCL 200 MG PO TABS
400.0000 mg | ORAL_TABLET | Freq: Two times a day (BID) | ORAL | Status: DC
  Filled 2022-10-21: qty 2

## 2022-10-21 SURGICAL SUPPLY — 25 items

## 2022-10-21 NOTE — Op Note (Signed)
[facility] Patient Name: Gwendolyn [name] Procedure Date: 10/21/2022 MRN: [id number] Attending MD: Carol Ada , MD, [id number] Date of Birth: Mar 31, 1956 CSN: [id number] Age: 66 Admit Type: Inpatient Procedure:                Upper GI endoscopy Indications:              Iron deficiency anemia Providers:                Carol Ada, MD, Burtis Junes, RN, William Dalton,                            Technician, Elmer Ramp. Tilden Dome, RN Referring MD:              Medicines:                Propofol per Anesthesia Complications:            No immediate complications. Estimated Blood Loss:     Estimated blood loss: none. Procedure:                Pre-Anesthesia Assessment:                           - Prior to the procedure, a History and Physical                            was performed, and patient medications and                            allergies were reviewed. The patient's tolerance of                            previous anesthesia was also reviewed. The risks                            and benefits of the procedure and the sedation                            options and risks were discussed with the patient.                            All questions were answered, and informed consent                            was obtained. Prior Anticoagulants: The patient has                            taken no anticoagulant or antiplatelet agents. ASA                            Grade Assessment: III - A patient with severe                            systemic disease. After reviewing the risks and  benefits, the patient was deemed in satisfactory                            condition to undergo the procedure.                           - Sedation was administered by an anesthesia                            professional. Deep sedation was attained.                           After obtaining informed consent, the endoscope was                            passed  under direct vision. Throughout the                            procedure, the patient's blood pressure, pulse, and                            oxygen saturations were monitored continuously. The                            [id number]-HQ190L ([id number]) Olympus colonoscope was                            introduced through the mouth, and advanced to the                            second part of duodenum. The upper GI endoscopy was                            accomplished without difficulty. The patient                            tolerated the procedure well. Scope In: Scope Out: Findings:      The esophagus was normal.      Multiple dispersed medium erosions with no stigmata of recent bleeding       were found in the gastric antrum. Biopsies were taken with a cold       forceps for Helicobacter pylori testing.      The examined duodenum was normal.      There were multiple healing erosions in the antrum. No active bleeding       was identified. Impression:               - Normal esophagus.                           - Erosive gastropathy with no stigmata of recent                            bleeding. Biopsied.                           -  Normal examined duodenum. Moderate Sedation:      Not Applicable - Patient had care per Anesthesia. Recommendation:           - Return patient to hospital ward for ongoing care.                           - Resume regular diet.                           - Continue present medications.                           - Await pathology results.                           - If her HGB is stable she can be discharged home.                           - Follow up with Dr. Collene Mares in 2-4 weeks.                           - Daily iron supplementation. Procedure Code(s):        --- Professional ---                           [phone], Esophagogastroduodenoscopy, flexible,                            transoral; with biopsy, single or multiple Diagnosis Code(s):        --- Professional ---                            K31.89, Other diseases of stomach and duodenum                           D50.9, Iron deficiency anemia, unspecified CPT copyright 2022 [employer]. All rights reserved. The codes documented in this report are preliminary and upon coder review may  be revised to meet current compliance requirements. Carol Ada, MD Carol Ada, MD 10/21/2022 8:22:58 AM This report has been signed electronically. Number of Addenda: 0

## 2022-10-21 NOTE — Anesthesia Preprocedure Evaluation (Signed)
Anesthesia Evaluation  Patient identified by MRN, date of birth, ID band Patient awake    Reviewed: Allergy & Precautions, NPO status , Patient's Chart, lab work & pertinent test results  Airway Mallampati: III  TM Distance: >3 FB Neck ROM: Full    Dental  (+) Teeth Intact, Dental Advisory Given   Pulmonary asthma , COPD,  COPD inhaler, Current Smoker   breath sounds clear to auscultation       Cardiovascular hypertension, Pt. on medications and Pt. on home beta blockers + Peripheral Vascular Disease and +CHF   Rhythm:Regular Rate:Normal     Neuro/Psych  PSYCHIATRIC DISORDERS Anxiety Depression       GI/Hepatic ,GERD  ,,  Endo/Other  Hypothyroidism    Renal/GU      Musculoskeletal   Abdominal Normal abdominal exam  (+)   Peds  Hematology   Anesthesia Other Findings   Reproductive/Obstetrics                             Anesthesia Physical Anesthesia Plan  ASA: 3  Anesthesia Plan: MAC   Post-op Pain Management:    Induction: Intravenous  PONV Risk Score and Plan: 0 and Propofol infusion  Airway Management Planned: Natural Airway and Simple Face Mask  Additional Equipment: None  Intra-op Plan:   Post-operative Plan:   Informed Consent: I have reviewed the patients History and Physical, chart, labs and discussed the procedure including the risks, benefits and alternatives for the proposed anesthesia with the patient or authorized representative who has indicated his/her understanding and acceptance.     Dental advisory given  Plan Discussed with: CRNA  Anesthesia Plan Comments:        Anesthesia Quick Evaluation

## 2022-10-21 NOTE — Progress Notes (Signed)
Medications delivered-meds to beds. Pt travels by wheelchair to main entrance to return home via pov. All belongings accompany pt. No distress noted.

## 2022-10-21 NOTE — Discharge Instructions (Addendum)
Please take all medications exactly as instructed.  This includes multiple changes to your blood pressure medications including increasing your labetalol dosing and the addition of lisinopril 40 mg by mouth daily to your blood pressure regimen.  All of your blood pressure medications have been refilled for you Please additionally take your new regimen for Protonix 40 mg by mouth twice daily as instructed.  You will take it twice daily for 1 month followed by once daily thereafter.  This is for management of your stomach ulcers. Please take your daily iron supplement before breakfast on a daily basis.  Your primary care provider should recheck your iron levels in 4 to 6 weeks. Please take your new vitamin B12 supplement once daily.  Primary care provider should recheck your vitamin B12 levels in 4 to 6 weeks. You may consume a regular diet.  Please avoid greasy food, spicy food or excess alcohol use. Please avoid using over-the-counter anti-inflammatory medication such as Aleve, naproxen, BC powder or Goody powder.  If you are experiencing mild pain in the outpatient setting Tylenol is the drug of choice. Please return to the emergency department if you develop any worsening weakness, chest pain, shortness of breath, lightheadedness or evidence of bleeding. Please follow-up with your primary care provider in 1 to 2 weeks.

## 2022-10-21 NOTE — Op Note (Signed)
[facility] Patient Name: Gwendolyn [name] Procedure Date: 10/21/2022 MRN: [id number] Attending MD: Carol Ada , MD, [id number] Date of Birth: [birth year] CSN: [id number] Age: 66 Admit Type: Inpatient Procedure:                Colonoscopy Indications:              Iron deficiency anemia Providers:                Carol Ada, MD, Burtis Junes, RN, William Dalton,                            Technician Referring MD:              Medicines:                Propofol per Anesthesia Complications:            No immediate complications. Estimated Blood Loss:     Estimated blood loss: none. Procedure:                Pre-Anesthesia Assessment:                           - Prior to the procedure, a History and Physical                            was performed, and patient medications and                            allergies were reviewed. The patient's tolerance of                            previous anesthesia was also reviewed. The risks                            and benefits of the procedure and the sedation                            options and risks were discussed with the patient.                            All questions were answered, and informed consent                            was obtained. Prior Anticoagulants: The patient has                            taken no anticoagulant or antiplatelet agents. ASA                            Grade Assessment: III - A patient with severe                            systemic disease. After reviewing the risks and                            benefits,  the patient was deemed in satisfactory                            condition to undergo the procedure.                           - Sedation was administered by an anesthesia                            professional. Deep sedation was attained.                           After obtaining informed consent, the colonoscope                            was passed under direct vision.  Throughout the                            procedure, the patient's blood pressure, pulse, and                            oxygen saturations were monitored continuously. The                            PCF-HQ190L ([id number]) Olympus colonoscope was                            introduced through the anus and advanced to the the                            cecum, identified by appendiceal orifice and                            ileocecal valve. The colonoscopy was performed                            without difficulty. The patient tolerated the                            procedure well. The quality of the bowel                            preparation was evaluated using the BBPS [facility]                            Bowel Preparation Scale) with scores of: Right                            Colon = 2 (minor amount of residual staining, small                            fragments of stool and/or opaque liquid, but mucosa  seen well), Transverse Colon = 2 (minor amount of                            residual staining, small fragments of stool and/or                            opaque liquid, but mucosa seen well) and Left Colon                            = 2 (minor amount of residual staining, small                            fragments of stool and/or opaque liquid, but mucosa                            seen well). The total BBPS score equals 6. The                            quality of the bowel preparation was good. The                            ileocecal valve, appendiceal orifice, and rectum                            were photographed. Scope In: 7:41:56 AM Scope Out: 8:08:08 AM Scope Withdrawal Time: 0 hours 9 minutes 0 seconds  Total Procedure Duration: 0 hours 26 minutes 12 seconds  Findings:      Five sessile polyps were found in the transverse colon. The polyps were       4 to 10 mm in size. These polyps were removed with a cold snare.       Resection and retrieval were  complete.      Technical issues prevented the capture of the many images of the colon       and polyps. Impression:               - Five 4 to 10 mm polyps in the transverse colon,                            removed with a cold snare. Resected and retrieved. Moderate Sedation:      Not Applicable - Patient had care per Anesthesia. Recommendation:           - Return patient to hospital ward for ongoing care.                           - Resume regular diet.                           - Continue present medications.                           - Await pathology results.                           - Repeat colonoscopy  in 3 years for surveillance. Procedure Code(s):        --- Professional ---                           [phone], Colonoscopy, flexible; with removal of                            tumor(s), polyp(s), or other lesion(s) by snare                            technique Diagnosis Code(s):        --- Professional ---                           D12.3, Benign neoplasm of transverse colon (hepatic                            flexure or splenic flexure)                           D50.9, Iron deficiency anemia, unspecified CPT copyright 2022 American Medical Association. All rights reserved. The codes documented in this report are preliminary and upon coder review may  be revised to meet current compliance requirements. Carol Ada, MD Carol Ada, MD 10/21/2022 8:27:54 AM This report has been signed electronically. Number of Addenda: 0

## 2022-10-21 NOTE — Discharge Summary (Signed)
Physician Discharge Summary   Patient: Gwendolyn [name] MRN: [id number] DOB: 1956-10-27  Admit date:     10/18/2022  Discharge date: 10/21/22  Discharge Physician: Vernelle Emerald   PCP: Jilda Panda, MD   Recommendations at discharge:   Patient was instructed to follow-up as an outpatient with her primary care provider in 1 to 2 weeks. Patient was identified to have vitamin B12 deficiency during this hospitalization.  Patient was given 2 intramuscular injections during this hospitalization and was discharged home on 1000 mcg of oral vitamin B12 once daily.  Patient was instructed to have her primary care provider obtain a repeat vitamin B12 level in 6 to 8 weeks. Concerning patient's iron deficiency anemia.  Patient received a 2 unit packed red blood cell transfusion during this hospitalization in addition to a Feraheme infusion.  Patient was placed back on daily oral iron supplementation and was instructed to inquire with her primary care provider about obtaining a repeat iron panel in 6 to 8 weeks. Patient is instructed to avoid any potential anticoagulants such as aspirin/NSAIDs.  Patient was instructed to avoid any significant alcohol use. Considering patient's identified nonbleeding gastric ulcer on upper endoscopy, patient was placed on a 1 month history of Protonix 40 mg by mouth twice daily followed by 40 mg by mouth once daily.  Discharge Diagnoses: Principal Problem:   Symptomatic anemia Active Problems:   Heart failure with mildly reduced ejection fraction (HFmrEF) (HCC)   COPD (chronic obstructive pulmonary disease) (HCC)   Abdominal aortic stenosis   Essential hypertension   Hypothyroidism   Vitamin B12 deficiency  Resolved Problems:   * No resolved hospital problems. *   Hospital Course: Gwendolyn [name] is a 66 y.o. female with medical history significant for chronic diastolic CHF (EF 10-93%), COPD, suprarenal abdominal aortic stenosis, anemia, HTN,  hypothyroidism, anxiety who presented to [facility] emergency department complaints of severe weakness.   Upon evaluation in the emergency department patient was found to have a markedly low hemoglobin at 5.5, FOBT positive.  Due to concerns over gastrointestinal bleeding the hospitalist group was then called to assess the patient for admission to the hospital.  2 units of packed red blood cells were transfused.  Patient exhibited no episodes of bleeding throughout hospitalization.  Dr.Hung/Mann were consulted who performed a bowel prep followed by upper endoscopy and colonoscopy on 12/15.  Of note, during the patient's anemia workup patient was found to have vitamin B12 deficiency of 134.  2 separate vitamin B12 intramuscular injections were given throughout the hospitalization.  At time of discharge, arrangements were made for patient to receive 1000 mcg of vitamin B12 orally daily with the intent for patient to have repeat vitamin B12 level obtained by her primary care provider in the next 6 to 8 weeks.  On 12/15 upper endoscopy revealed erosions consistent with erosive gastropathy with no evidence of active bleeding.  Colonoscopy performed on 12/15 revealed 5 sessile polyps that were successfully removed via cold snare with no evidence of active bleeding.  Considering these results patient's substantial anemia was felt to be secondary to extremely slow chronic gastrointestinal bleeding, possibly from the erosive gastropathy.  Patient was initiated on a regimen of Protonix 40 mg by mouth twice daily for a minimum of 1 month followed by 40 mg by mouth once daily afterwards.  Is also worth noting the patient exhibited markedly elevated blood pressures throughout the hospitalization.  To address this patient's regimen of labetalol was increased to 400  mg twice daily.  Additionally, patient was placed on lisinopril 40 mg by mouth once daily.  This is in addition to patient's usual regimen of  hydralazine 50 mg 3 times daily and amlodipine 10 mg daily.  Patient was administered a Feraheme infusion prior to discharge.  Patient was started in improved and stable condition the afternoon of 12/15.      Consultants: Dr. Adriana Mccallum with Gastroenterology Procedures performed: Upper endoscopy and colonoscopy on 12/15 Disposition: Home Diet recommendation:  Discharge Diet Orders (From admission, onward)     Start     Ordered   10/21/22 0000  Diet - low sodium heart healthy        10/21/22 1343           Cardiac diet  DISCHARGE MEDICATION: Allergies as of 10/21/2022       Reactions   Doxycycline Anaphylaxis, Rash   Azilsartan Cough   Biaxin [clarithromycin] Diarrhea, Other (See Comments)   Vertigo   Floxacillin [floxacillin (flucloxacillin)] Other (See Comments)   Vertigo   Morphine And Related Other (See Comments)   Headache        Medication List     STOP taking these medications    aspirin EC 81 MG tablet   clopidogrel 75 MG tablet Commonly known as: PLAVIX   Fusion Plus Caps   isosorbide dinitrate 30 MG tablet Commonly known as: ISORDIL   potassium chloride 10 MEQ tablet Commonly known as: KLOR-CON   Varenicline Tartrate (Starter) 0.5 MG X 11 & 1 MG X 42 Tbpk       TAKE these medications    acetaminophen 500 MG tablet Commonly known as: TYLENOL Take 500 mg by mouth 2 (two) times daily as needed for headache or moderate pain.   albuterol 108 (90 Base) MCG/ACT inhaler Commonly known as: VENTOLIN HFA Inhale 2 puffs into the lungs as needed for wheezing or shortness of breath.   amLODipine 5 MG tablet Commonly known as: NORVASC Take 2 tablets (10 mg total) by mouth daily.   Breztri Aerosphere 160-9-4.8 MCG/ACT Aero Generic drug: Budeson-Glycopyrrol-Formoterol Inhale 2 puffs into the lungs as needed (SOB).   cyanocobalamin 1000 MCG tablet Commonly known as: VITAMIN B12 Take 1 tablet (1,000 mcg total) by mouth daily.   ferrous  sulfate 325 (65 FE) MG tablet Take 1 tablet (325 mg total) by mouth daily before breakfast.   furosemide 20 MG tablet Commonly known as: LASIX Take 20 mg by mouth daily. What changed: Another medication with the same name was removed. Continue taking this medication, and follow the directions you see here.   hydrALAZINE 50 MG tablet Commonly known as: APRESOLINE Take 1 tablet (50 mg total) by mouth 3 (three) times daily.   HYDROcodone-acetaminophen 5-325 MG tablet Commonly known as: NORCO/VICODIN Take 0.5 tablets by mouth 2 (two) times daily as needed for moderate pain.   isosorbide mononitrate 30 MG 24 hr tablet Commonly known as: IMDUR Take 30 mg by mouth daily.   labetalol 200 MG tablet Commonly known as: NORMODYNE Take 2 tablets (400 mg total) by mouth 2 (two) times daily. What changed:  how much to take additional instructions Another medication with the same name was removed. Continue taking this medication, and follow the directions you see here.   levothyroxine 100 MCG tablet Commonly known as: SYNTHROID Take 100 mcg by mouth daily before breakfast.   lisinopril 40 MG tablet Commonly known as: ZESTRIL Take 1 tablet (40 mg total) by mouth daily.   pantoprazole 40  MG tablet Commonly known as: PROTONIX Take 1 tablet by mouth 2 times daily before a meal for 30 days, THEN take1 tablet daily. Start taking on: October 21, 2022   rosuvastatin 40 MG tablet Commonly known as: CRESTOR Take 40 mg by mouth daily.   valACYclovir 1000 MG tablet Commonly known as: VALTREX Take 1,000 mg by mouth every 8 (eight) hours.        Follow-up Information     Jilda Panda, MD. Schedule an appointment as soon as possible for a visit in 1 week(s).   Specialty: Internal Medicine Contact information: [phone] [place] [address] [phone]                 Discharge Exam: Filed Weights   10/18/22 1907 10/21/22 0500  Weight: 63 kg 66.8 kg     Constitutional: Awake alert and oriented x3, no associated distress.   Respiratory: clear to auscultation bilaterally, no wheezing, no crackles. Normal respiratory effort. No accessory muscle use.  Cardiovascular: Regular rate and rhythm, no murmurs / rubs / gallops. No extremity edema. 2+ pedal pulses. No carotid bruits.  Abdomen: Abdomen is soft and nontender.  No evidence of intra-abdominal masses.  Positive bowel sounds noted in all quadrants.   Musculoskeletal: No joint deformity upper and lower extremities. Good ROM, no contractures. Normal muscle tone.     Condition at discharge: fair  The results of significant diagnostics from this hospitalization (including imaging, microbiology, ancillary and laboratory) are listed below for reference.   Imaging Studies: DG Chest Port 1 View  Result Date: 10/18/2022 CLINICAL DATA:  Shortness of breath and low hemoglobin. Reports active shingles outbreak since November 27. EXAM: PORTABLE CHEST 1 VIEW COMPARISON:  Portable chest 02/02/2022. FINDINGS: The heart is moderately enlarged. There is calcification in the aortic arch, stable mediastinum with slight aortic tortuosity. Central vessels are slightly prominent without overt edema findings. There are bilateral minimal pleural effusions and adjacent linear atelectasis in the lateral bases, bilaterally improved from the prior study. No focal pneumonia is evident. Degenerative disc disease and spondylosis thoracic spine again is noted with mild osteopenia. IMPRESSION: 1. Cardiomegaly with slightly prominent central vessels but no overt edema. 2. Bilateral minimal pleural effusions and adjacent linear atelectasis, improved from the prior study. 3. Aortic atherosclerosis. Electronically Signed   By: Telford Nab M.D.   On: 10/18/2022 20:05    Microbiology: Results for orders placed or performed during the hospital encounter of 02/01/22  Resp Panel by RT-PCR (Flu A&B, Covid) Nasopharyngeal Swab      Status: None   Collection Time: 02/01/22  3:21 PM   Specimen: Nasopharyngeal Swab; Nasopharyngeal(NP) swabs in vial transport medium  Result Value Ref Range Status   SARS Coronavirus 2 by RT PCR NEGATIVE NEGATIVE Final    Comment: (NOTE) SARS-CoV-2 target nucleic acids are NOT DETECTED.  The SARS-CoV-2 RNA is generally detectable in upper respiratory specimens during the acute phase of infection. The lowest concentration of SARS-CoV-2 viral copies this assay can detect is 138 copies/mL. A negative result does not preclude SARS-Cov-2 infection and should not be used as the sole basis for treatment or other patient management decisions. A negative result may occur with  improper specimen collection/handling, submission of specimen other than nasopharyngeal swab, presence of viral mutation(s) within the areas targeted by this assay, and inadequate number of viral copies(<138 copies/mL). A negative result must be combined with clinical observations, patient history, and epidemiological information. The expected result is Negative.  Fact Sheet for  Patients:  EntrepreneurPulse.com.au  Fact Sheet for Healthcare Providers:  IncredibleEmployment.be  This test is no t yet approved or cleared by the [place] FDA and  has been authorized for detection and/or diagnosis of SARS-CoV-2 by FDA under an Emergency Use Authorization (EUA). This EUA will remain  in effect (meaning this test can be used) for the duration of the COVID-19 declaration under Section 564(b)(1) of the Act, 21 U.S.C.section 360bbb-3(b)(1), unless the authorization is terminated  or revoked sooner.       Influenza A by PCR NEGATIVE NEGATIVE Final   Influenza B by PCR NEGATIVE NEGATIVE Final    Comment: (NOTE) The Xpert Xpress SARS-CoV-2/FLU/RSV plus assay is intended as an aid in the diagnosis of influenza from Nasopharyngeal swab specimens and should not be used as a sole basis  for treatment. Nasal washings and aspirates are unacceptable for Xpert Xpress SARS-CoV-2/FLU/RSV testing.  Fact Sheet for Patients: EntrepreneurPulse.com.au  Fact Sheet for Healthcare Providers: IncredibleEmployment.be  This test is not yet approved or cleared by the [place] FDA and has been authorized for detection and/or diagnosis of SARS-CoV-2 by FDA under an Emergency Use Authorization (EUA). This EUA will remain in effect (meaning this test can be used) for the duration of the COVID-19 declaration under Section 564(b)(1) of the Act, 21 U.S.C. section 360bbb-3(b)(1), unless the authorization is terminated or revoked.  Performed at [facility], [place] [address], [place], [place] [address]     Labs: CBC: Recent Labs  Lab 10/18/22 1940 10/19/22 0448 10/20/22 0508 10/21/22 0619  WBC 8.5 8.8 7.9 7.4  NEUTROABS 6.2  --  5.2 4.6  HGB 5.5* 8.3* 8.4* 9.7*  HCT 19.0* 26.4* 26.9* 31.3*  MCV 83.3 83.8 83.8 84.6  PLT 190 182 182 423   Basic Metabolic Panel: Recent Labs  Lab 10/18/22 1940 10/19/22 0448 10/20/22 0508 10/21/22 0619  NA 135 136 138 140  K 3.5 3.7 3.4* 3.9  CL 106 106 105 104  CO2 '22 22 24 27  '$ GLUCOSE 99 107* 98 97  BUN '10 10 16 10  '$ CREATININE 0.69 0.66 0.84 0.77  CALCIUM 8.6* 8.8* 8.8* 9.1  MG  --   --  2.0 2.3   Liver Function Tests: Recent Labs  Lab 10/20/22 0508 10/21/22 0619  AST 16 18  ALT 11 15  ALKPHOS 45 49  BILITOT 0.7 0.7  PROT 5.8* 6.8  ALBUMIN 3.4* 4.2   CBG: No results for input(s): "GLUCAP" in the last 168 hours.  Discharge time spent: greater than 30 minutes.  Signed: Vernelle Emerald, MD [facility] 10/21/2022

## 2022-10-21 NOTE — Transfer of Care (Signed)
Immediate Anesthesia Transfer of Care Note  Patient: Gwendolyn [name]  Procedure(s) Performed: ESOPHAGOGASTRODUODENOSCOPY (EGD) WITH PROPOFOL COLONOSCOPY WITH PROPOFOL BIOPSY POLYPECTOMY  Patient Location: PACU  Anesthesia Type:MAC  Level of Consciousness: drowsy  Airway & Oxygen Therapy: Patient Spontanous Breathing and Patient connected to face mask oxygen  Post-op Assessment: Report given to RN, Post -op Vital signs reviewed and stable, and Patient moving all extremities X 4  Post vital signs: Reviewed and stable  Last Vitals:  Vitals Value Taken Time  BP 237/55   Temp    Pulse 90 10/21/22 0813  Resp 15 10/21/22 0813  SpO2 100 % 10/21/22 0813  Vitals shown include unvalidated device data.  Last Pain:  Vitals:   10/21/22 0649  TempSrc: Temporal  PainSc: 0-No pain      Patients Stated Pain Goal: 0 (67/70/34 0352)  Complications: No notable events documented.

## 2022-10-21 NOTE — Interval H&P Note (Signed)
History and Physical Interval Note:  10/21/2022 7:20 AM  Gwendolyn [name]  has presented today for surgery, with the diagnosis of anemia.  The various methods of treatment have been discussed with the patient and family. After consideration of risks, benefits and other options for treatment, the patient has consented to  Procedure(s): ESOPHAGOGASTRODUODENOSCOPY (EGD) WITH PROPOFOL (N/A) COLONOSCOPY WITH PROPOFOL (N/A) as a surgical intervention.  The patient's history has been reviewed, patient examined, no change in status, stable for surgery.  I have reviewed the patient's chart and labs.  Questions were answered to the patient's satisfaction.     Latish Toutant D

## 2022-10-21 NOTE — Anesthesia Procedure Notes (Signed)
Procedure Name: MAC Date/Time: 10/21/2022 7:29 AM  Performed by: Niel Hummer, CRNAPre-anesthesia Checklist: Patient identified, Emergency Drugs available, Suction available and Patient being monitored Oxygen Delivery Method: Simple face mask

## 2022-10-21 NOTE — Anesthesia Postprocedure Evaluation (Signed)
Anesthesia Post Note  Patient: Gwendolyn [name]  Procedure(s) Performed: ESOPHAGOGASTRODUODENOSCOPY (EGD) WITH PROPOFOL COLONOSCOPY WITH PROPOFOL BIOPSY POLYPECTOMY     Patient location during evaluation: PACU Anesthesia Type: MAC Level of consciousness: awake and alert Pain management: pain level controlled Vital Signs Assessment: post-procedure vital signs reviewed and stable Respiratory status: spontaneous breathing, nonlabored ventilation, respiratory function stable and patient connected to nasal cannula oxygen Cardiovascular status: stable and blood pressure returned to baseline Postop Assessment: no apparent nausea or vomiting Anesthetic complications: no   No notable events documented.  Last Vitals:  Vitals:   10/21/22 0834 10/21/22 0839  BP: (!) 197/42 (!) 207/49  Pulse: 75 75  Resp: 17 13  Temp:    SpO2: 97% 98%    Last Pain:  Vitals:   10/21/22 0839  TempSrc:   PainSc: 0-No pain                 Effie Berkshire

## 2022-10-21 NOTE — Progress Notes (Signed)
DC order recd. IV and tele Dc per protocol. Pt tol well. DC instructions reviewed with pt who voices understanding. Await medications from pharmacy. Pt voices no concerns or c/o. No distress noted. All belongings to accompany pt. Pt will travel by wheelchair to main entrance to return home via pov.

## 2022-10-24 LAB — SURGICAL PATHOLOGY

## 2022-10-25 ENCOUNTER — Encounter (HOSPITAL_COMMUNITY): Payer: Self-pay | Admitting: Gastroenterology

## 2022-12-22 ENCOUNTER — Ambulatory Visit (HOSPITAL_COMMUNITY): Admit: 2022-12-22 | Payer: Medicare Other | Admitting: Gastroenterology

## 2022-12-22 ENCOUNTER — Encounter (HOSPITAL_COMMUNITY): Payer: Self-pay

## 2022-12-22 SURGERY — IMAGING PROCEDURE, GI TRACT, INTRALUMINAL, VIA CAPSULE
Anesthesia: LOCAL

## 2023-05-28 ENCOUNTER — Emergency Department (HOSPITAL_COMMUNITY): Payer: Medicare Other

## 2023-05-28 ENCOUNTER — Inpatient Hospital Stay (HOSPITAL_COMMUNITY)
Admission: EM | Admit: 2023-05-28 | Discharge: 2023-06-04 | DRG: 291 | Disposition: A | Discharge status: Home / Self Care | Payer: Medicare Other | Attending: Internal Medicine | Admitting: Internal Medicine

## 2023-05-28 ENCOUNTER — Other Ambulatory Visit: Payer: Self-pay

## 2023-05-28 ENCOUNTER — Inpatient Hospital Stay (HOSPITAL_COMMUNITY): Payer: Medicare Other

## 2023-05-28 ENCOUNTER — Encounter (HOSPITAL_COMMUNITY): Payer: Self-pay

## 2023-05-28 DIAGNOSIS — Z800 Family history of malignant neoplasm of digestive organs: Secondary | ICD-10-CM

## 2023-05-28 DIAGNOSIS — D649 Anemia, unspecified: Secondary | ICD-10-CM

## 2023-05-28 DIAGNOSIS — R0989 Other specified symptoms and signs involving the circulatory and respiratory systems: Secondary | ICD-10-CM | POA: Diagnosis not present

## 2023-05-28 DIAGNOSIS — Z881 Allergy status to other antibiotic agents status: Secondary | ICD-10-CM

## 2023-05-28 DIAGNOSIS — Z8249 Family history of ischemic heart disease and other diseases of the circulatory system: Secondary | ICD-10-CM

## 2023-05-28 DIAGNOSIS — Z87442 Personal history of urinary calculi: Secondary | ICD-10-CM

## 2023-05-28 DIAGNOSIS — I110 Hypertensive heart disease with heart failure: Principal | ICD-10-CM | POA: Diagnosis present

## 2023-05-28 DIAGNOSIS — Z87892 Personal history of anaphylaxis: Secondary | ICD-10-CM

## 2023-05-28 DIAGNOSIS — Z90710 Acquired absence of both cervix and uterus: Secondary | ICD-10-CM

## 2023-05-28 DIAGNOSIS — K922 Gastrointestinal hemorrhage, unspecified: Secondary | ICD-10-CM | POA: Diagnosis present

## 2023-05-28 DIAGNOSIS — I6522 Occlusion and stenosis of left carotid artery: Secondary | ICD-10-CM | POA: Diagnosis present

## 2023-05-28 DIAGNOSIS — I160 Hypertensive urgency: Secondary | ICD-10-CM | POA: Diagnosis present

## 2023-05-28 DIAGNOSIS — D62 Acute posthemorrhagic anemia: Secondary | ICD-10-CM | POA: Diagnosis present

## 2023-05-28 DIAGNOSIS — K3189 Other diseases of stomach and duodenum: Secondary | ICD-10-CM | POA: Diagnosis present

## 2023-05-28 DIAGNOSIS — Z79899 Other long term (current) drug therapy: Secondary | ICD-10-CM

## 2023-05-28 DIAGNOSIS — E039 Hypothyroidism, unspecified: Secondary | ICD-10-CM | POA: Diagnosis present

## 2023-05-28 DIAGNOSIS — I5031 Acute diastolic (congestive) heart failure: Secondary | ICD-10-CM | POA: Insufficient documentation

## 2023-05-28 DIAGNOSIS — I1A0 Resistant hypertension: Secondary | ICD-10-CM | POA: Diagnosis present

## 2023-05-28 DIAGNOSIS — D509 Iron deficiency anemia, unspecified: Secondary | ICD-10-CM | POA: Diagnosis present

## 2023-05-28 DIAGNOSIS — I352 Nonrheumatic aortic (valve) stenosis with insufficiency: Secondary | ICD-10-CM | POA: Diagnosis present

## 2023-05-28 DIAGNOSIS — Z20822 Contact with and (suspected) exposure to covid-19: Secondary | ICD-10-CM | POA: Insufficient documentation

## 2023-05-28 DIAGNOSIS — R590 Localized enlarged lymph nodes: Secondary | ICD-10-CM | POA: Diagnosis present

## 2023-05-28 DIAGNOSIS — I350 Nonrheumatic aortic (valve) stenosis: Secondary | ICD-10-CM

## 2023-05-28 DIAGNOSIS — I5032 Chronic diastolic (congestive) heart failure: Secondary | ICD-10-CM | POA: Diagnosis not present

## 2023-05-28 DIAGNOSIS — K219 Gastro-esophageal reflux disease without esophagitis: Secondary | ICD-10-CM | POA: Diagnosis present

## 2023-05-28 DIAGNOSIS — I5022 Chronic systolic (congestive) heart failure: Secondary | ICD-10-CM

## 2023-05-28 DIAGNOSIS — J441 Chronic obstructive pulmonary disease with (acute) exacerbation: Secondary | ICD-10-CM | POA: Diagnosis not present

## 2023-05-28 DIAGNOSIS — I5033 Acute on chronic diastolic (congestive) heart failure: Secondary | ICD-10-CM | POA: Diagnosis not present

## 2023-05-28 DIAGNOSIS — T463X5A Adverse effect of coronary vasodilators, initial encounter: Secondary | ICD-10-CM | POA: Diagnosis not present

## 2023-05-28 DIAGNOSIS — Z79890 Hormone replacement therapy (postmenopausal): Secondary | ICD-10-CM

## 2023-05-28 DIAGNOSIS — E782 Mixed hyperlipidemia: Secondary | ICD-10-CM | POA: Diagnosis present

## 2023-05-28 DIAGNOSIS — R0602 Shortness of breath: Secondary | ICD-10-CM | POA: Insufficient documentation

## 2023-05-28 DIAGNOSIS — E785 Hyperlipidemia, unspecified: Secondary | ICD-10-CM | POA: Diagnosis not present

## 2023-05-28 DIAGNOSIS — I701 Atherosclerosis of renal artery: Secondary | ICD-10-CM | POA: Diagnosis present

## 2023-05-28 DIAGNOSIS — Z8616 Personal history of COVID-19: Secondary | ICD-10-CM

## 2023-05-28 DIAGNOSIS — J449 Chronic obstructive pulmonary disease, unspecified: Secondary | ICD-10-CM | POA: Diagnosis not present

## 2023-05-28 DIAGNOSIS — K633 Ulcer of intestine: Secondary | ICD-10-CM | POA: Diagnosis not present

## 2023-05-28 DIAGNOSIS — Z885 Allergy status to narcotic agent status: Secondary | ICD-10-CM

## 2023-05-28 DIAGNOSIS — F17210 Nicotine dependence, cigarettes, uncomplicated: Secondary | ICD-10-CM | POA: Diagnosis not present

## 2023-05-28 DIAGNOSIS — Q251 Coarctation of aorta: Secondary | ICD-10-CM | POA: Diagnosis not present

## 2023-05-28 DIAGNOSIS — G4440 Drug-induced headache, not elsewhere classified, not intractable: Secondary | ICD-10-CM | POA: Diagnosis not present

## 2023-05-28 DIAGNOSIS — E871 Hypo-osmolality and hyponatremia: Secondary | ICD-10-CM | POA: Diagnosis not present

## 2023-05-28 DIAGNOSIS — I161 Hypertensive emergency: Secondary | ICD-10-CM | POA: Diagnosis present

## 2023-05-28 DIAGNOSIS — Z833 Family history of diabetes mellitus: Secondary | ICD-10-CM

## 2023-05-28 DIAGNOSIS — Z8701 Personal history of pneumonia (recurrent): Secondary | ICD-10-CM

## 2023-05-28 DIAGNOSIS — E7800 Pure hypercholesterolemia, unspecified: Secondary | ICD-10-CM | POA: Diagnosis not present

## 2023-05-28 DIAGNOSIS — I5020 Unspecified systolic (congestive) heart failure: Secondary | ICD-10-CM | POA: Insufficient documentation

## 2023-05-28 DIAGNOSIS — R0601 Orthopnea: Secondary | ICD-10-CM

## 2023-05-28 DIAGNOSIS — I700 Atherosclerosis of aorta: Secondary | ICD-10-CM | POA: Diagnosis present

## 2023-05-28 DIAGNOSIS — N179 Acute kidney failure, unspecified: Secondary | ICD-10-CM | POA: Diagnosis present

## 2023-05-28 DIAGNOSIS — F17200 Nicotine dependence, unspecified, uncomplicated: Secondary | ICD-10-CM

## 2023-05-28 DIAGNOSIS — E876 Hypokalemia: Secondary | ICD-10-CM | POA: Diagnosis present

## 2023-05-28 DIAGNOSIS — I509 Heart failure, unspecified: Principal | ICD-10-CM

## 2023-05-28 HISTORY — DX: Chronic obstructive pulmonary disease with (acute) exacerbation: J44.1

## 2023-05-28 LAB — COMPREHENSIVE METABOLIC PANEL
ALT: 14 U/L (ref 0–44)
AST: 15 U/L (ref 15–41)
Albumin: 4 g/dL (ref 3.5–5.0)
Alkaline Phosphatase: 54 U/L (ref 38–126)
Anion gap: 7 (ref 5–15)
BUN: 12 mg/dL (ref 8–23)
CO2: 22 mmol/L (ref 22–32)
Calcium: 8.7 mg/dL — ABNORMAL LOW (ref 8.9–10.3)
Chloride: 102 mmol/L (ref 98–111)
Creatinine, Ser: 0.78 mg/dL (ref 0.44–1.00)
GFR, Estimated: >60 mL/min (ref >60)
Glucose, Bld: 111 mg/dL — ABNORMAL HIGH (ref 70–99)
Potassium: 3.5 mmol/L (ref 3.5–5.1)
Sodium: 131 mmol/L — ABNORMAL LOW (ref 135–145)
Total Bilirubin: 0.4 mg/dL (ref 0.3–1.2)
Total Protein: 6.6 g/dL (ref 6.5–8.1)

## 2023-05-28 LAB — CBC WITH DIFFERENTIAL/PLATELET
Abs Immature Granulocytes: 0.07 10*3/uL (ref 0.00–0.07)
Basophils Absolute: 0.1 10*3/uL (ref 0.0–0.1)
Basophils Relative: 1 %
Eosinophils Absolute: 0.1 10*3/uL (ref 0.0–0.5)
Eosinophils Relative: 1 %
HCT: 30.9 % — ABNORMAL LOW (ref 36.0–46.0)
Hemoglobin: 10 g/dL — ABNORMAL LOW (ref 12.0–15.0)
Immature Granulocytes: 1 %
Lymphocytes Relative: 12 %
Lymphs Abs: 1.2 10*3/uL (ref 0.7–4.0)
MCH: 28.3 pg (ref 26.0–34.0)
MCHC: 32.4 g/dL (ref 30.0–36.0)
MCV: 87.5 fL (ref 80.0–100.0)
Monocytes Absolute: 0.6 10*3/uL (ref 0.1–1.0)
Monocytes Relative: 6 %
Neutro Abs: 8 10*3/uL — ABNORMAL HIGH (ref 1.7–7.7)
Neutrophils Relative %: 79 %
Platelets: 251 10*3/uL (ref 150–400)
RBC: 3.53 MIL/uL — ABNORMAL LOW (ref 3.87–5.11)
RDW: 14.1 % (ref 11.5–15.5)
WBC: 10 10*3/uL (ref 4.0–10.5)
nRBC: 0 % (ref 0.0–0.2)

## 2023-05-28 LAB — RESPIRATORY PANEL BY PCR

## 2023-05-28 LAB — TSH: TSH: 1.427 u[IU]/mL (ref 0.350–4.500)

## 2023-05-28 LAB — TROPONIN I (HIGH SENSITIVITY): Troponin I (High Sensitivity): 16 ng/L (ref <18)

## 2023-05-28 LAB — BRAIN NATRIURETIC PEPTIDE: B Natriuretic Peptide: 1292.5 pg/mL — ABNORMAL HIGH (ref 0.0–100.0)

## 2023-05-28 LAB — PROCALCITONIN: Procalcitonin: <0.1 ng/mL

## 2023-05-28 LAB — HIV ANTIBODY (ROUTINE TESTING W REFLEX): HIV Screen 4th Generation wRfx: NONREACTIVE

## 2023-05-28 MED ORDER — IPRATROPIUM BROMIDE 0.02 % IN SOLN
0.5000 mg | Freq: Once | RESPIRATORY_TRACT | Status: AC
  Administered 2023-05-28: 0.5 mg via RESPIRATORY_TRACT
  Filled 2023-05-28: qty 2.5

## 2023-05-28 MED ORDER — PREDNISONE 20 MG PO TABS
60.0000 mg | ORAL_TABLET | Freq: Once | ORAL | Status: AC
  Administered 2023-05-28: 60 mg via ORAL
  Filled 2023-05-28: qty 3

## 2023-05-28 MED ORDER — HYDRALAZINE HCL 25 MG PO TABS
50.0000 mg | ORAL_TABLET | Freq: Once | ORAL | Status: AC
  Administered 2023-05-28: 50 mg via ORAL
  Filled 2023-05-28: qty 2

## 2023-05-28 MED ORDER — LABETALOL HCL 200 MG PO TABS
400.0000 mg | ORAL_TABLET | Freq: Two times a day (BID) | ORAL | Status: DC
  Administered 2023-05-28 – 2023-05-31 (×7): 400 mg via ORAL
  Filled 2023-05-28 (×7): qty 2

## 2023-05-28 MED ORDER — HYDROCOD POLI-CHLORPHE POLI ER 10-8 MG/5ML PO SUER
5.0000 mL | Freq: Two times a day (BID) | ORAL | Status: DC | PRN
  Filled 2023-05-28: qty 5

## 2023-05-28 MED ORDER — CALCIUM GLUCONATE-NACL 1-0.675 GM/50ML-% IV SOLN
1.0000 g | Freq: Once | INTRAVENOUS | Status: AC
  Administered 2023-05-28: 1000 mg via INTRAVENOUS
  Filled 2023-05-28: qty 50

## 2023-05-28 MED ORDER — ROSUVASTATIN CALCIUM 20 MG PO TABS
40.0000 mg | ORAL_TABLET | Freq: Every day | ORAL | Status: DC
  Administered 2023-05-29 – 2023-06-04 (×7): 40 mg via ORAL
  Filled 2023-05-28 (×7): qty 2

## 2023-05-28 MED ORDER — AMLODIPINE BESYLATE 10 MG PO TABS
10.0000 mg | ORAL_TABLET | Freq: Every day | ORAL | Status: DC
  Administered 2023-05-29 – 2023-06-04 (×7): 10 mg via ORAL
  Filled 2023-05-28 (×7): qty 1

## 2023-05-28 MED ORDER — LEVOTHYROXINE SODIUM 100 MCG PO TABS
100.0000 ug | ORAL_TABLET | Freq: Every day | ORAL | Status: DC
  Administered 2023-05-29 – 2023-06-04 (×6): 100 ug via ORAL
  Filled 2023-05-28 (×6): qty 1

## 2023-05-28 MED ORDER — SODIUM CHLORIDE 0.9% FLUSH
3.0000 mL | Freq: Two times a day (BID) | INTRAVENOUS | Status: DC
  Administered 2023-05-29 – 2023-06-04 (×11): 3 mL via INTRAVENOUS

## 2023-05-28 MED ORDER — ALBUTEROL SULFATE (2.5 MG/3ML) 0.083% IN NEBU
5.0000 mg | INHALATION_SOLUTION | Freq: Once | RESPIRATORY_TRACT | Status: AC
  Administered 2023-05-28: 5 mg via RESPIRATORY_TRACT
  Filled 2023-05-28: qty 6

## 2023-05-28 MED ORDER — IOHEXOL 350 MG/ML SOLN
100.0000 mL | Freq: Once | INTRAVENOUS | Status: AC | PRN
  Administered 2023-05-28: 100 mL via INTRAVENOUS

## 2023-05-28 MED ORDER — NICOTINE 21 MG/24HR TD PT24
21.0000 mg | MEDICATED_PATCH | Freq: Every day | TRANSDERMAL | Status: DC
  Administered 2023-05-28 – 2023-06-04 (×8): 21 mg via TRANSDERMAL
  Filled 2023-05-28 (×8): qty 1

## 2023-05-28 MED ORDER — ACETAMINOPHEN 325 MG PO TABS
650.0000 mg | ORAL_TABLET | Freq: Four times a day (QID) | ORAL | Status: DC | PRN
  Administered 2023-05-28 – 2023-06-02 (×8): 650 mg via ORAL
  Filled 2023-05-28 (×10): qty 2

## 2023-05-28 MED ORDER — FUROSEMIDE 10 MG/ML IJ SOLN
40.0000 mg | Freq: Two times a day (BID) | INTRAMUSCULAR | Status: DC
  Administered 2023-05-29 (×2): 40 mg via INTRAVENOUS
  Filled 2023-05-28 (×2): qty 4

## 2023-05-28 MED ORDER — NITROGLYCERIN IN D5W 200-5 MCG/ML-% IV SOLN
0.0000 ug/min | INTRAVENOUS | Status: DC
  Administered 2023-05-28: 4 ug/min via INTRAVENOUS
  Administered 2023-05-29 (×2): 70 ug/min via INTRAVENOUS
  Filled 2023-05-28 (×4): qty 250

## 2023-05-28 MED ORDER — ENOXAPARIN SODIUM 40 MG/0.4ML IJ SOSY
40.0000 mg | PREFILLED_SYRINGE | INTRAMUSCULAR | Status: DC
  Administered 2023-05-28 – 2023-05-29 (×2): 40 mg via SUBCUTANEOUS
  Filled 2023-05-28 (×2): qty 0.4

## 2023-05-28 MED ORDER — HYDRALAZINE HCL 20 MG/ML IJ SOLN
10.0000 mg | INTRAMUSCULAR | Status: DC | PRN
  Administered 2023-05-28 – 2023-06-03 (×14): 10 mg via INTRAVENOUS
  Filled 2023-05-28 (×15): qty 1

## 2023-05-28 MED ORDER — PANTOPRAZOLE SODIUM 40 MG PO TBEC
40.0000 mg | DELAYED_RELEASE_TABLET | Freq: Every day | ORAL | Status: DC
  Administered 2023-05-28 – 2023-05-30 (×3): 40 mg via ORAL
  Filled 2023-05-28 (×3): qty 1

## 2023-05-28 MED ORDER — FUROSEMIDE 10 MG/ML IJ SOLN
60.0000 mg | Freq: Once | INTRAMUSCULAR | Status: AC
  Administered 2023-05-28: 60 mg via INTRAVENOUS
  Filled 2023-05-28: qty 6

## 2023-05-28 MED ORDER — ACETAMINOPHEN 650 MG RE SUPP: 650.0000 mg | Freq: Four times a day (QID) | RECTAL | Status: DC | PRN

## 2023-05-28 MED ORDER — ACETAMINOPHEN 500 MG PO TABS
1000.0000 mg | ORAL_TABLET | Freq: Once | ORAL | Status: AC
  Administered 2023-05-28: 1000 mg via ORAL
  Filled 2023-05-28: qty 2

## 2023-05-28 MED ORDER — ALBUTEROL SULFATE (2.5 MG/3ML) 0.083% IN NEBU
2.5000 mg | INHALATION_SOLUTION | RESPIRATORY_TRACT | Status: DC | PRN
  Administered 2023-06-01: 2.5 mg via RESPIRATORY_TRACT
  Filled 2023-05-28: qty 3

## 2023-05-28 MED ORDER — HYDRALAZINE HCL 50 MG PO TABS
50.0000 mg | ORAL_TABLET | Freq: Three times a day (TID) | ORAL | Status: DC
  Administered 2023-05-28 – 2023-05-29 (×2): 50 mg via ORAL
  Filled 2023-05-28 (×2): qty 1

## 2023-05-28 NOTE — ED Notes (Signed)
ED TO INPATIENT HANDOFF REPORT  ED Nurse Name and Phone #: chris [phone]  S Name/Age/Gender Gwendolyn [name] 67 y.o. female Room/Bed: 035C/035C  Code Status   Code Status: Full Code  Home/SNF/Other Home Patient oriented to: self, place, time, and situation Is this baseline? Yes   Triage Complete: Triage complete  Chief Complaint CHF (congestive heart failure) (HCC) [I50.9]  Triage Note Pt arrives via [facility] for SOB. Pt reportedly COVID pos three weeks ago. Cough and SOB persisted and pt was given Amoxicillin which she took 5/7 days of course and then stopped.   EMS VS 96% on RA, HR 88, BP 220/96, EtCO2 26.    Allergies Allergies  Allergen Reactions   Doxycycline Anaphylaxis and Rash   Azilsartan Cough   Biaxin [Clarithromycin] Diarrhea and Other (See Comments)    Vertigo   Floxacillin [Floxacillin (Flucloxacillin)] Other (See Comments)    Vertigo   Morphine And Codeine Other (See Comments)    Headache    Level of Care/Admitting Diagnosis ED Disposition     ED Disposition  Admit   Condition  --   Comment  Hospital Area: [facility] [[id number]]  Level of Care: Telemetry Cardiac [[id number]]  May admit patient to [facility] or [facility] if equivalent level of care is available:: No  Covid Evaluation: Recent COVID positive no isolation required infection day 21-90  Diagnosis: CHF (congestive heart failure) [facility]) [[id number]]  Admitting Physician: Clydie Braun [[id number]]  Attending Physician: Clydie Braun [[id number]]  Certification:: I certify this patient will need inpatient services for at least 2 midnights  Estimated Length of Stay: 2          B Medical/Surgery History Past Medical History:  Diagnosis Date   Anemia    Anxiety    Asthma    COPD (chronic obstructive pulmonary disease) (HCC)    Depression    GERD (gastroesophageal reflux disease)    History of kidney stones    Hypertension    Hypothyroidism    Past Surgical History:   Procedure Laterality Date   ABDOMINAL AORTOGRAM W/LOWER EXTREMITY N/A 02/03/2022   Procedure: ABDOMINAL AORTOGRAM W/LOWER EXTREMITY;  Surgeon: Yates Decamp, MD;  [facility] INVASIVE CV LAB;  Service: Cardiovascular;  Laterality: N/A;   ABDOMINAL HYSTERECTOMY     BIOPSY  10/21/2022   Procedure: BIOPSY;  Surgeon: Jeani Hawking, MD;  Location: [facility] ENDOSCOPY;  Service: Gastroenterology;;   Cesarean Section     (2)   CHOLECYSTECTOMY     COLONOSCOPY  2020   COLONOSCOPY WITH PROPOFOL N/A 10/21/2022   Procedure: COLONOSCOPY WITH PROPOFOL;  Surgeon: Jeani Hawking, MD;  Location: [facility] ENDOSCOPY;  Service: Gastroenterology;  Laterality: N/A;   ESOPHAGOGASTRODUODENOSCOPY (EGD) WITH PROPOFOL N/A 10/21/2022   Procedure: ESOPHAGOGASTRODUODENOSCOPY (EGD) WITH PROPOFOL;  Surgeon: Jeani Hawking, MD;  Location: [facility]OS[facility];  Service: Gastroenterology;  Laterality: N/A;   Gastric Ulcer  2020   PERIPHERAL VASCULAR BALLOON ANGIOPLASTY  02/03/2022   Procedure: PERIPHERAL VASCULAR BALLOON ANGIOPLASTY;  Surgeon: Yates Decamp, MD;  [facility];  Service: Cardiovascular;;   POLYPECTOMY  10/21/2022   Procedure: POLYPECTOMY;  Surgeon: Jeani Hawking, MD;  Location: [facility];  Service: Gastroenterology;;   RENAL ANGIOGRAPHY N/A 02/03/2022   Procedure: RENAL ANGIOGRAPHY;  Surgeon: Yates Decamp, MD;  Location: [facility] INVASIVE CV LAB;  Service: Cardiovascular;  Laterality: N/A;   UMBILICAL HERNIA REPAIR N/A 10/29/2020   Procedure: OPEN UMBILICAL HERNIA REPAIR WITH MESH;  Surgeon: Stechschulte, Hyman Hopes, MD;  Location: [facility]  OR;  Service: General;  Laterality: N/A;     A IV Location/Drains/Wounds Patient Lines/Drains/Airways Status     Active Line/Drains/Airways     Name Placement date Placement time Site Days   Peripheral IV 05/28/23 20 G Right Antecubital 05/28/23  1239  Antecubital  less than 1   Wound / Incision (Open or Dehisced) 10/19/22 Other (Comment) Arm Anterior;Left;Upper shingles diagnosed 12/7,  currently just a red rash and not open or draining 10/19/22  1414  Arm  221            Intake/Output Last 24 hours No intake or output data in the 24 hours ending 05/28/23 1627  Labs/Imaging Results for orders placed or performed during the hospital encounter of 05/28/23 (from the past 48 hour(s))  CBC with Differential/Platelet     Status: Abnormal   Collection Time: 05/28/23 12:40 PM  Result Value Ref Range   WBC 10.0 4.0 - 10.5 K/uL   RBC 3.53 (L) 3.87 - 5.11 MIL/uL   Hemoglobin 10.0 (L) 12.0 - 15.0 g/dL   HCT 08.6 (L) 57.8 - 46.9 %   MCV 87.5 80.0 - 100.0 fL   MCH 28.3 26.0 - 34.0 pg   MCHC 32.4 30.0 - 36.0 g/dL   RDW 62.9 52.8 - 41.3 %   Platelets 251 150 - 400 K/uL   nRBC 0.0 0.0 - 0.2 %   Neutrophils Relative % 79 %   Neutro Abs 8.0 (H) 1.7 - 7.7 K/uL   Lymphocytes Relative 12 %   Lymphs Abs 1.2 0.7 - 4.0 K/uL   Monocytes Relative 6 %   Monocytes Absolute 0.6 0.1 - 1.0 K/uL   Eosinophils Relative 1 %   Eosinophils Absolute 0.1 0.0 - 0.5 K/uL   Basophils Relative 1 %   Basophils Absolute 0.1 0.0 - 0.1 K/uL   Immature Granulocytes 1 %   Abs Immature Granulocytes 0.07 0.00 - 0.07 K/uL    Comment: Performed at [facility], [address], [place], [place] [address]  Comprehensive metabolic panel     Status: Abnormal   Collection Time: 05/28/23 12:40 PM  Result Value Ref Range   Sodium 131 (L) 135 - 145 mmol/L   Potassium 3.5 3.5 - 5.1 mmol/L   Chloride 102 98 - 111 mmol/L   CO2 22 22 - 32 mmol/L   Glucose, Bld 111 (H) 70 - 99 mg/dL    Comment: Glucose reference range applies only to samples taken after fasting for at least 8 hours.   BUN 12 8 - 23 mg/dL   Creatinine, Ser 0.27 0.44 - 1.00 mg/dL   Calcium 8.7 (L) 8.9 - 10.3 mg/dL   Total Protein 6.6 6.5 - 8.1 g/dL   Albumin 4.0 3.5 - 5.0 g/dL   AST 15 15 - 41 U/L   ALT 14 0 - 44 U/L   Alkaline Phosphatase 54 38 - 126 U/L   Total Bilirubin 0.4 0.3 - 1.2 mg/dL   GFR, Estimated >25 >36 mL/min    Comment:  (NOTE) Calculated using the CKD-EPI Creatinine Equation (2021)    Anion gap 7 5 - 15    Comment: Performed at [facility], [address] [place], [place] [address]  Brain natriuretic peptide     Status: Abnormal   Collection Time: 05/28/23  2:20 PM  Result Value Ref Range   B Natriuretic Peptide 1,292.5 (H) 0.0 - 100.0 pg/mL    Comment: Performed at [facility], [address], [place], [place] [address]  Troponin I (High Sensitivity)     Status: None   Collection Time: 05/28/23  2:20 PM  Result Value Ref Range   Troponin I (High Sensitivity) 16 <18 ng/L    Comment: (NOTE) Elevated high sensitivity troponin I (hsTnI) values and significant  changes across serial measurements may suggest ACS but many other  chronic and acute conditions are known to elevate hsTnI results.  Refer to the "Links" section for chest pain algorithms and additional  guidance. Performed at [facility], [address] [place], [place] [address]    DG Chest Port 1 View  Result Date: 05/28/2023 CLINICAL DATA:  Shortness of breath. EXAM: PORTABLE CHEST 1 VIEW COMPARISON:  10/18/2022 FINDINGS: Aortic atherosclerotic calcifications. Stable cardiac enlargement. Low lung volumes. Small bilateral pleural effusions and mild interstitial edema. Scar versus atelectasis in the lateral left base. IMPRESSION: Cardiomegaly, small bilateral pleural effusions and mild interstitial edema. Correlate for any signs or symptoms of CHF. Electronically Signed   By: Signa Kell M.D.   On: 05/28/2023 13:03    Pending Labs Unresulted Labs (From admission, onward)     Start     Ordered   05/29/23 0500  CBC  Tomorrow morning,   R        05/28/23 1621   05/29/23 0500  Basic metabolic panel  Daily,   R      05/28/23 1621   05/28/23 1621  TSH  Add-on,   AD        05/28/23 1621   05/28/23 1616  HIV Antibody (routine testing w rflx)  (HIV Antibody (Routine testing w reflex) panel)  Once,   R        05/28/23 1621             Vitals/Pain Today's Vitals   05/28/23 1230 05/28/23 1245 05/28/23 1515 05/28/23 1529  BP: (!) 207/68 (!) 141/128 (!) 213/75   Pulse: 87 80 91   Resp: (!) 23 15 18    Temp:    98.7 F (37.1 C)  TempSrc:      SpO2: 100% 100% 100%   Weight:      Height:      PainSc:        Isolation Precautions No active isolations  Medications Medications  furosemide (LASIX) injection 60 mg (has no administration in time range)  enoxaparin (LOVENOX) injection 40 mg (has no administration in time range)  furosemide (LASIX) injection 40 mg (has no administration in time range)  sodium chloride flush (NS) 0.9 % injection 3 mL (has no administration in time range)  calcium gluconate 1 g/ 50 mL sodium chloride IVPB (has no administration in time range)  acetaminophen (TYLENOL) tablet 650 mg (has no administration in time range)    Or  acetaminophen (TYLENOL) suppository 650 mg (has no administration in time range)  albuterol (PROVENTIL) (2.5 MG/3ML) 0.083% nebulizer solution 2.5 mg (has no administration in time range)  albuterol (PROVENTIL) (2.5 MG/3ML) 0.083% nebulizer solution 5 mg (5 mg Nebulization Given 05/28/23 1228)  ipratropium (ATROVENT) nebulizer solution 0.5 mg (0.5 mg Nebulization Given 05/28/23 1228)  hydrALAZINE (APRESOLINE) tablet 50 mg (50 mg Oral Given 05/28/23 1228)  predniSONE (DELTASONE) tablet 60 mg (60 mg Oral Given 05/28/23 1409)    Mobility walks     Focused Assessments Cardiac Assessment Handoff:    No results found for: "CKTOTAL", "CKMB", "CKMBINDEX", "TROPONINI" No results found for: "DDIMER" Does the Patient currently have chest pain? No  R Recommendations: See Admitting Provider Note  Report given to:   Additional Notes:

## 2023-05-28 NOTE — ED Provider Notes (Signed)
67 yo female w/ COPD, recent covid illness 3 weeks ago, presenting with persistent coughing.  Treated with amoxicillin  + azithromycin in past month  Given prednisone, breathing treatment    Physical Exam  BP (!) 141/128   Pulse 80   Temp 98.3 F (36.8 C) (Oral)   Resp 15   Ht 4' 11.5" (1.511 m)   Wt 67.1 kg   LMP  (LMP Unknown)   SpO2 100%   BMI 29.39 kg/m   Physical Exam  Procedures  Procedures  ED Course / MDM    Medical Decision Making Amount and/or Complexity of Data Reviewed Labs: ordered. Radiology: ordered.  Risk Prescription drug management. Decision regarding hospitalization.   Patient was reassessed at 4 PM after her breathing treatment and prednisone.  She continues to have dyspnea, particular sitting back.  She says she is unable to lie down or even on an incline because she will feel short of breath.  On exam she has distant breath sounds, difficult to auscultate, but there is fine crackles bilaterally.  Her BNP is elevated and her x-ray both suggest that she may be experiencing developing congestive heart failure, likely in the setting of her recent COVID illness.  I have ordered her IV Lasix for diuresis and at this point I do think she will require admission given her labored breathing and her symptoms.  She would benefit from a repeat echocardiogram is been over a year she reports she last her echocardiogram (through Dr Verl Dicker office, her cardiologist).  Both the patient and her daughter are in agreement.    415 pm - admitted to Dr Katrinka Blazing hospitalist   Renaye Rakers Kermit Balo, MD 05/28/23 [phone]

## 2023-05-28 NOTE — H&P (Addendum)
History and Physical    Patient: Gwendolyn [name] ZOX:[id number] DOB: Nov 02, 1956 DOA: 05/28/2023 DOS: the patient was seen and examined on 05/28/2023 PCP: Ralene Ok, MD  Patient coming from: Home  Chief Complaint:  Chief Complaint  Patient presents with   Shortness of Breath   HPI: Gwendolyn [name] is a 67 y.o. female with medical history significant of hypertension, COPD, hypothyroidism, suprarenal abdominal aortic stenosis,  anemia, and tobacco abuse who presents with complaints of progressively worsening shortness of breath.  Patient tested positive for COVID-19 about 3 weeks ago and had been given azithromycin.  She temporarily felt better but continued to have a lingering cough.  Follow with her primary and had been given prescription for Augmentin for which she took 5 of the 7 day course, but stopped it yesterday as it was starting to upset her stomach.  Patient reported associated symptoms of orthopnea for which she had been unable to sleep.  Sitting up did help make breathing better.  She does not utilize any inhalers.  Denies having any significant lower extremity swelling, but has swelling in her abdomen.  She had intermittently been having fevers, but denies having any recurrence of fever in the last 48 hours.  Patient does report still smoking a pack cigarettes per day on average.      In the emergency department patient was noted to be afebrile with tachypnea, blood pressures elevated up to 213/75, and O2 saturation currently maintained on room air.  Labs significant for BNP 1292.5, high-sensitivity troponin 16, hemoglobin 10, sodium 131, and calcium 8.7.  Chest x-ray noted cardiomegaly with small bilateral pleural effusions and mild interstitial edema.  Patient had been given Lasix 60 mg IV, prednisone 60 mg p.o., and DuoNeb breathing treatment.   Review of Systems: As mentioned in the history of present illness. All other systems reviewed and are negative. Past Medical  History:  Diagnosis Date   Anemia    Anxiety    Asthma    COPD (chronic obstructive pulmonary disease) (HCC)    Depression    GERD (gastroesophageal reflux disease)    History of kidney stones    Hypertension    Hypothyroidism    Past Surgical History:  Procedure Laterality Date   ABDOMINAL AORTOGRAM W/LOWER EXTREMITY N/A 02/03/2022   Procedure: ABDOMINAL AORTOGRAM W/LOWER EXTREMITY;  Surgeon: Yates Decamp, MD;  [facility]: [facility] INVASIVE CV LAB;  Service: Cardiovascular;  Laterality: N/A;   ABDOMINAL HYSTERECTOMY     BIOPSY  10/21/2022   Procedure: BIOPSY;  Surgeon: Jeani Hawking, MD;  Location: [facility];  Service: Gastroenterology;;   Cesarean Section     (2)   CHOLECYSTECTOMY     COLONOSCOPY  2020   COLONOSCOPY WITH PROPOFOL N/A 10/21/2022   Procedure: COLONOSCOPY WITH PROPOFOL;  Surgeon: Jeani Hawking, MD;  Location: [facility];  Service: Gastroenterology;  Laterality: N/A;   ESOPHAGOGASTRODUODENOSCOPY (EGD) WITH PROPOFOL N/A 10/21/2022   Procedure: ESOPHAGOGASTRODUODENOSCOPY (EGD) WITH PROPOFOL;  Surgeon: Jeani Hawking, MD;  Location: [facility];  Service: Gastroenterology;  Laterality: N/A;   Gastric Ulcer  2020   PERIPHERAL VASCULAR BALLOON ANGIOPLASTY  02/03/2022   Procedure: PERIPHERAL VASCULAR BALLOON ANGIOPLASTY;  Surgeon: Yates Decamp, MD;  [facility]: [facility];  Service: Cardiovascular;;   POLYPECTOMY  10/21/2022   Procedure: POLYPECTOMY;  Surgeon: Jeani Hawking, MD;  Location: [facility];  Service: Gastroenterology;;   RENAL ANGIOGRAPHY N/A 02/03/2022   Procedure: RENAL ANGIOGRAPHY;  Surgeon: Yates Decamp, MD;  Location: [facility] INVASIVE CV LAB;  Service:  Cardiovascular;  Laterality: N/A;   UMBILICAL HERNIA REPAIR N/A 10/29/2020   Procedure: OPEN UMBILICAL HERNIA REPAIR WITH MESH;  Surgeon: Stechschulte, Hyman Hopes, MD;  Location: [facility] OR;  Service: General;  Laterality: N/A;   Social History:  reports that she has been smoking cigarettes. She has a 40 pack-year smoking  history. She has never used smokeless tobacco. She reports that she does not drink alcohol and does not use drugs.  Allergies  Allergen Reactions   Doxycycline Anaphylaxis and Rash   Azilsartan Cough   Biaxin [Clarithromycin] Diarrhea and Other (See Comments)    Vertigo   Floxacillin [Floxacillin (Flucloxacillin)] Other (See Comments)    Vertigo   Morphine And Codeine Other (See Comments)    Headache    Family History  Problem Relation Age of Onset   Colon cancer Mother    Diabetes Father    Diabetes Sister    Breast cancer Sister 41   Hypertension Sister     Prior to Admission medications   Medication Sig Start Date End Date Taking? Authorizing Provider  acetaminophen (TYLENOL) 500 MG tablet Take 500 mg by mouth 2 (two) times daily as needed for headache or moderate pain.    [provider]  albuterol (VENTOLIN HFA) 108 (90 Base) MCG/ACT inhaler Inhale 2 puffs into the lungs as needed for wheezing or shortness of breath. 06/28/22   [provider]  amLODipine (NORVASC) 5 MG tablet Take 2 tablets (10 mg total) by mouth daily. 10/21/22   Shalhoub, Deno Lunger, MD  Budeson-Glycopyrrol-Formoterol (BREZTRI AEROSPHERE) 160-9-4.8 MCG/ACT AERO Inhale 2 puffs into the lungs as needed (SOB).    [provider]  cyanocobalamin (VITAMIN B12) 1000 MCG tablet Take 1 tablet (1,000 mcg total) by mouth daily. 10/21/22   Shalhoub, Deno Lunger, MD  ferrous sulfate 325 (65 FE) MG tablet Take 1 tablet (325 mg total) by mouth daily before breakfast. 10/21/22 10/21/23  Shalhoub, Deno Lunger, MD  furosemide (LASIX) 20 MG tablet Take 20 mg by mouth daily.    [provider]  hydrALAZINE (APRESOLINE) 50 MG tablet Take 1 tablet (50 mg total) by mouth 3 (three) times daily. 10/21/22   Shalhoub, Deno Lunger, MD  HYDROcodone-acetaminophen (NORCO/VICODIN) 5-325 MG tablet Take 0.5 tablets by mouth 2 (two) times daily as needed for moderate pain.    [provider]  isosorbide  mononitrate (IMDUR) 30 MG 24 hr tablet Take 30 mg by mouth daily.    [provider]  labetalol (NORMODYNE) 200 MG tablet Take 2 tablets (400 mg total) by mouth 2 (two) times daily. 10/21/22   Shalhoub, Deno Lunger, MD  levothyroxine (SYNTHROID) 100 MCG tablet Take 100 mcg by mouth daily before breakfast.    [provider]  lisinopril (ZESTRIL) 40 MG tablet Take 1 tablet (40 mg total) by mouth daily. 10/21/22   Shalhoub, Deno Lunger, MD  pantoprazole (PROTONIX) 40 MG tablet Take 1 tablet by mouth 2 times daily before a meal for 30 days, THEN take1 tablet daily. 10/21/22 12/20/22  Shalhoub, Deno Lunger, MD  rosuvastatin (CRESTOR) 40 MG tablet Take 40 mg by mouth daily. 09/14/22   [provider]  valACYclovir (VALTREX) 1000 MG tablet Take 1,000 mg by mouth every 8 (eight) hours. 10/13/22   [provider]    Physical Exam: Vitals:   05/28/23 1230 05/28/23 1245 05/28/23 1515 05/28/23 1529  BP: (!) 207/68 (!) 141/128 (!) 213/75   Pulse: 87 80 91   Resp: (!) 23 15 18  Temp:    98.7 F (37.1 C)  TempSrc:      SpO2: 100% 100% 100%   Weight:      Height:        Constitutional: Elderly female sitting up in the hospital gurney Eyes: Patient has exophthalmos with disconjugate gaze ENMT: Mucous membranes are moist.  Fair dentition.  Neck: normal, supple.  JVD present. Respiratory: Decreased overall aeration with intermittent crackles appreciated in both lung fields.  Patient able to talk in fairly complete sentences.  O2 saturation maintained on room air. Cardiovascular: Regular rate and rhythm . No extremity edema.   Abdomen: no tenderness, no masses palpated. No hepatosplenomegaly. Bowel sounds positive.  Musculoskeletal: no clubbing / cyanosis. No joint deformity upper and lower extremities. Good ROM, no contractures. Normal muscle tone.  Skin: no rashes, lesions, ulcers. No induration Neurologic: CN 2-12 grossly intact. Sensation intact, DTR normal. Strength 5/5 in  all 4.  Psychiatric: Normal judgment and insight. Alert and oriented x 3. Normal mood.   Data Reviewed:  EKG appears to show normal sinus rhythm at 92 bpm. reviewed labs, imaging, and pertinent records as noted above in HPI.  Assessment and Plan:  Heart failure with reduced EF Suspected possible acute on chronic.  Patient presents with progressively worsening orthopnea.  On physical exam patient with crackles appreciated. BNP elevated at 1292.5.  Chest x-ray concerning for cardiomegaly with small bilateral pleural effusions and mild interstitial.  Last echocardiogram noted EF to be 40-45% global hypokinesis 02/04/2022.  She had been started on Lasix IV 60 mg. -Admit to a progressive bed -Heart failure order set utilized -Strict I&Os and daily weights -Check procalcitonin  -Check echocardiogram -Lasix 40 mg IV twice daily -Heme-onc cardiology notified of admission.  Hypertensive urgency Initial blood pressure was elevated up to 213/75. -Resume home pressure regimen including amlodipine, labetalol, and hydralazine -Start nitroglycerin drip -Hydralazine IV as needed  Hyponatremia Acute.  Thought possibly hypervolemic hyponatremia.  Secondary to patient being acutely fluid overloaded. -Continue to monitor sodium levels with diuresis  History of COVID-19 Patient reports being diagnosed with COVID 19 approximately 3 weeks ago.  Reported having intermittent fevers, but none in the last 2 days.  Treated with empiric antibiotics of azithromycin and subsequently Augmentin that.   -Check respiratory virus panel as recommended by cardiology  COPD, without exacerbation No significant wheezes were appreciated on physical exam at this time.  Patient had been initially given prednisone 60 mg p.o. for the possibility of a exacerbation. -Albuterol nebs as needed for shortness of breath/wheezing  Abdominal aortic stenosis History of suprarenal abdominal aortic stenosis, high-grade, 90-99%, treated  with shockwave lithotripsy, balloon angioplasty 02/03/2022.  Not on any antiplatelet therapy. -Continue Crestor  Hypothyroidism -Check TSH -Continue levothyroxine  Normocytic anemia Hemoglobin 10 which appears around patient's baseline.  No reports of bleeding. -Continue to monitor  Hyperlipidemia -Continue Crestor  Tobacco abuse Patient reports still smoking about a pack cigarettes per day on average.   -Continue to counsel on need of cessation of tobacco use  -Nicotine patch offered  DVT prophylaxis: Advance Care Planning:   Code Status: Full Code    Consults: [place] cardiology  Family Communication: None requested  Severity of Illness: The appropriate patient status for this patient is INPATIENT. Inpatient status is judged to be reasonable and necessary in order to provide the required intensity of service to ensure the patient's safety. The patient's presenting symptoms, physical exam findings, and initial radiographic and laboratory data in the context of their chronic  comorbidities is felt to place them at high risk for further clinical deterioration. Furthermore, it is not anticipated that the patient will be medically stable for discharge from the hospital within 2 midnights of admission.   * I certify that at the point of admission it is my clinical judgment that the patient will require inpatient hospital care spanning beyond 2 midnights from the point of admission due to high intensity of service, high risk for further deterioration and high frequency of surveillance required.*  Author: Clydie Braun, MD 05/28/2023 4:11 PM  For on call review [email or web address].

## 2023-05-28 NOTE — ED Triage Notes (Signed)
Pt arrives via [facility] for SOB. Pt reportedly COVID pos three weeks ago. Cough and SOB persisted and pt was given Amoxicillin which she took 5/7 days of course and then stopped.   EMS VS 96% on RA, HR 88, BP 220/96, EtCO2 26.

## 2023-05-28 NOTE — Consult Note (Signed)
CARDIOLOGY CONSULT NOTE  Patient ID: Gwendolyn [name] MRN: [id number] DOB/AGE: 1956/04/03 67 y.o.  Admit date: 05/28/2023 Attending physician: Clydie Braun, MD Primary Physician:  Ralene Ok, MD Outpatient Cardiology Provider: Dr. Jacinto Halim and Elvin So, PA Inpatient Cardiologist: Tessa Lerner, DO, [facility]  Reason of consultation: Acute HFpEF Referring physician: Clydie Braun, MD  Chief complaint: shortness of breath  HPI:  Gwendolyn [name] is a 67 y.o. Caucasian female who presents with a chief complaint of "shortness of breath." Her past medical history and cardiovascular risk factors include: resistant hypertension, mixed hyperlipidemia, nicotine dependence, COPD, known distal aorta stenosis (s/p successful Abdominal arteriogram and selective left renal arteriogram and lithotripsy balloon angioplasty of the suprarenal abdominal aorta using shockwave M5 catheter 02/03/2022), chronic HFpEF, hx of hypertensive emergency.  Presents to the ED via EMS due to shortness of breath.  She had COVID approximately 3 weeks ago and has been treated with 5 days of Z-Pak as well as 5 days of amoxicillin without any significant relief.  She has been taking her medications as prescribed including her antihypertensives as per the patient.  She denies anginal chest pain, back pain, no syncope or near syncope.  She has shortness of breath at rest and with effort related activities, orthopnea, denies PND or lower extremity swelling.  Cardiology has been asked to evaluate the patient for acute heart failure exacerbation.  She continues to smoke 1 pack/day  ALLERGIES: Allergies  Allergen Reactions   Doxycycline Anaphylaxis and Rash   Azilsartan Cough   Biaxin [Clarithromycin] Diarrhea and Other (See Comments)    Vertigo   Floxacillin [Floxacillin (Flucloxacillin)] Other (See Comments)    Vertigo   Morphine And Codeine Other (See Comments)    Headache    PAST MEDICAL HISTORY: Past  Medical History:  Diagnosis Date   Anemia    Anxiety    Asthma    COPD (chronic obstructive pulmonary disease) (HCC)    Depression    GERD (gastroesophageal reflux disease)    History of kidney stones    Hypertension    Hypothyroidism     PAST SURGICAL HISTORY: Past Surgical History:  Procedure Laterality Date   ABDOMINAL AORTOGRAM W/LOWER EXTREMITY N/A 02/03/2022   Procedure: ABDOMINAL AORTOGRAM W/LOWER EXTREMITY;  Surgeon: Yates Decamp, MD;  Location: [facility];  Service: Cardiovascular;  Laterality: N/A;   ABDOMINAL HYSTERECTOMY     BIOPSY  10/21/2022   Procedure: BIOPSY;  Surgeon: Jeani Hawking, MD;  Location: [facility];  Service: Gastroenterology;;   Cesarean Section     (2)   CHOLECYSTECTOMY     COLONOSCOPY  2020   COLONOSCOPY WITH PROPOFOL N/A 10/21/2022   Procedure: COLONOSCOPY WITH PROPOFOL;  Surgeon: Jeani Hawking, MD;  Location: [facility];  Service: Gastroenterology;  Laterality: N/A;   ESOPHAGOGASTRODUODENOSCOPY (EGD) WITH PROPOFOL N/A 10/21/2022   Procedure: ESOPHAGOGASTRODUODENOSCOPY (EGD) WITH PROPOFOL;  Surgeon: Jeani Hawking, MD;  Location: [facility];  Service: Gastroenterology;  Laterality: N/A;   Gastric Ulcer  2020   PERIPHERAL VASCULAR BALLOON ANGIOPLASTY  02/03/2022   Procedure: PERIPHERAL VASCULAR BALLOON ANGIOPLASTY;  Surgeon: Yates Decamp, MD;  Location: [facility];  Service: Cardiovascular;;   POLYPECTOMY  10/21/2022   Procedure: POLYPECTOMY;  Surgeon: Jeani Hawking, MD;  Location: [facility];  Service: Gastroenterology;;   RENAL ANGIOGRAPHY N/A 02/03/2022   Procedure: RENAL ANGIOGRAPHY;  Surgeon: Yates Decamp, MD;  Location: [facility] INVASIVE CV LAB;  Service: Cardiovascular;  Laterality: N/A;   UMBILICAL HERNIA REPAIR N/A 10/29/2020  Procedure: OPEN UMBILICAL HERNIA REPAIR WITH MESH;  Surgeon: Stechschulte, Hyman Hopes, MD;  Location: [facility] OR;  Service: General;  Laterality: N/A;    FAMILY HISTORY: The patient's family history includes  Breast cancer (age of onset: 85) in her sister; Colon cancer in her mother; Diabetes in her father and sister; Hypertension in her sister.   SOCIAL HISTORY:  The patient  reports that she has been smoking cigarettes. She has a 40 pack-year smoking history. She has never used smokeless tobacco. She reports that she does not drink alcohol and does not use drugs.  MEDICATIONS: Current Outpatient Medications  Medication Instructions   acetaminophen (TYLENOL) 500 mg, Oral, 2 times daily PRN   albuterol (VENTOLIN HFA) 108 (90 Base) MCG/ACT inhaler 2 puffs, Inhalation, As needed   amLODipine (NORVASC) 10 mg, Oral, Daily   Budeson-Glycopyrrol-Formoterol (BREZTRI AEROSPHERE) 160-9-4.8 MCG/ACT AERO 2 puffs, Inhalation, As needed   cyanocobalamin (VITAMIN B12) 1,000 mcg, Oral, Daily   ferrous sulfate 325 mg, Oral, Daily before breakfast   furosemide (LASIX) 20 mg, Oral, Daily   hydrALAZINE (APRESOLINE) 50 mg, Oral, 3 times daily   HYDROcodone-acetaminophen (NORCO/VICODIN) 5-325 MG tablet 0.5 tablets, Oral, 2 times daily PRN   isosorbide mononitrate (IMDUR) 30 mg, Oral, Daily   labetalol (NORMODYNE) 400 mg, Oral, 2 times daily   levothyroxine (SYNTHROID) 100 mcg, Oral, Daily before breakfast   lisinopril (ZESTRIL) 40 mg, Oral, Daily   pantoprazole (PROTONIX) 40 MG tablet Take 1 tablet by mouth 2 times daily before a meal for 30 days, THEN take1 tablet daily.   rosuvastatin (CRESTOR) 40 mg, Oral, Daily    REVIEW OF SYSTEMS: Review of Systems  Cardiovascular:  Positive for dyspnea on exertion and orthopnea. Negative for chest pain, claudication, irregular heartbeat, leg swelling, near-syncope, palpitations, paroxysmal nocturnal dyspnea and syncope.  Respiratory:  Positive for cough and shortness of breath.   Hematologic/Lymphatic: Negative for bleeding problem.  Musculoskeletal:  Negative for muscle cramps and myalgias.  Neurological:  Negative for dizziness and light-headedness.    PHYSICAL  EXAMINATION: PHYSICAL EXAM: Temp:  [98.3 F (36.8 C)-99.1 F (37.3 C)] 99.1 F (37.3 C) (07/21 1847) Pulse Rate:  [80-101] 94 (07/21 1847) Resp:  [15-24] 22 (07/21 1847) BP: (141-233)/(65-174) 217/65 (07/21 1847) SpO2:  [96 %-100 %] 96 % (07/21 1847) Weight:  [67.1 kg-69.3 kg] 69.3 kg (07/21 1847)  Intake/Output:  Intake/Output Summary (Last 24 hours) at 05/28/2023 1907 Last data filed at 05/28/2023 1833 Gross per 24 hour  Intake 37.79 ml  Output 200 ml  Net -162.21 ml     Net IO Since Admission: -162.21 mL [05/28/23 1907]  Weights:     05/28/2023    6:47 PM 05/28/2023   12:03 PM 10/21/2022    5:00 AM  Last 3 Weights  Weight (lbs) 152 lb 12.5 oz 148 lb 147 lb 4.3 oz  Weight (kg) 69.3 kg 67.132 kg 66.8 kg   Today's Vitals   05/28/23 1615 05/28/23 1730 05/28/23 1800 05/28/23 1847  BP: (!) 217/68 (!) 233/66 (!) 222/66 (!) 217/65  Pulse: 93 (!) 101 98 94  Resp: (!) 23 (!) 21 (!) 24 (!) 22  Temp:    99.1 F (37.3 C)  TempSrc:    Oral  SpO2: 97% 98% 97% 96%  Weight:    69.3 kg  Height:    5\' 1"  (1.549 m)  PainSc:       Body mass index is 28.87 kg/m.  Physical Exam  Constitutional:  Visually appears in acute distress,  prefers to sit up, tachypnea,   Neck: JVD present.  Cardiovascular: Normal rate, regular rhythm, S1 normal, S2 normal, intact distal pulses and normal pulses. Exam reveals no gallop, no S3 and no S4.  Murmur heard. Decrescendo diastolic murmur is present with a grade of 3/4 at the upper right sternal border. Pulses:      Carotid pulses are  on the right side with bruit and  on the left side with bruit. Peripheral pulses are difficult to palpate.  No abdominal bruit noted.  Pulmonary/Chest: No stridor. She has bibasilar rales and scattered wheezes.  Poor inspiratory effort, decreased breath sounds bilaterally, expiratory wheezes and rales noted.  Abdominal: Soft. Bowel sounds are normal. She exhibits no distension. There is no abdominal tenderness.   Musculoskeletal:        General: No edema.     Cervical back: Neck supple.  Neurological: She is alert and oriented to person, place, and time. She has intact cranial nerves (2-12).  Skin: Skin is warm and moist.    LAB RESULTS: Chemistry Recent Labs  Lab 05/28/23 1240  NA 131*  K 3.5  CL 102  CO2 22  GLUCOSE 111*  BUN 12  CREATININE 0.78  CALCIUM 8.7*  PROT 6.6  ALBUMIN 4.0  AST 15  ALT 14  ALKPHOS 54  BILITOT 0.4  GFRNONAA >60  ANIONGAP 7    Hematology Recent Labs  Lab 05/28/23 1240  WBC 10.0  RBC 3.53*  HGB 10.0*  HCT 30.9*  MCV 87.5  MCH 28.3  MCHC 32.4  RDW 14.1  PLT 251   High Sensitivity Troponin:   Recent Labs  Lab 05/28/23 1420  TROPONINIHS 16     Cardiac EnzymesNo results for input(s): "TROPONINI" in the last 168 hours. No results for input(s): "TROPIPOC" in the last 168 hours.  BNP Recent Labs  Lab 05/28/23 1420  BNP 1,292.5*    DDimer No results for input(s): "DDIMER" in the last 168 hours.  Hemoglobin A1c: No results found for: "HGBA1C", "MPG" TSH  Recent Labs    05/28/23 1240  TSH 1.427   Lipid Panel  Lab Results  Component Value Date   CHOL 126 02/04/2022   HDL 27 (L) 02/04/2022   LDLCALC 70 02/04/2022   TRIG 147 02/04/2022   CHOLHDL 4.7 02/04/2022   Drugs of Abuse  No results found for: "LABOPIA", "COCAINSCRNUR", "LABBENZ", "AMPHETMU", "THCU", "LABBARB"    CARDIAC DATABASE: EKG: May 28, 2023: Sinus rhythm, 92 bpm, ST-T changes in the lateral leads consider LVH with repolarization abnormality but ischemia cannot be ruled out.   Echocardiogram: 02/03/2022:  1. Left ventricular ejection fraction, by estimation, is 50 to 55%. The  left ventricle has low normal function. The left ventricle has no regional  wall motion abnormalities. There is mild left ventricular hypertrophy.  Left ventricular diastolic  parameters are consistent with Grade II diastolic dysfunction  (pseudonormalization).   2. Right ventricular  systolic function is normal. The right ventricular  size is normal.   3. Left atrial size was severely dilated.   4. The mitral valve is normal in structure. Mild mitral valve  regurgitation. No evidence of mitral stenosis.   5. The aortic valve is tricuspid. Aortic valve regurgitation is moderate.   6. The inferior vena cava is normal in size with <50% respiratory  variability, suggesting right atrial pressure of 8 mmHg.   Comparison(s): A prior study was performed on 09/09/2022. No significant  change.   Stress Testing:  Exercise nuclear  stress test 03/28/2022: Normal ECG stress. The patient exercised for 2 minutes and 59 seconds of a Bruce protocol, achieving approximately 4.64 METs and achieved 88% MPHR. Stress terminated due to fatigue.  The heart rate response was accelerated. Exercise capacity was severely reduced.   The baseline blood pressure was 200/70 mmHg and increased to 230/76 mmHg, which is a normal response to exercise. Myocardial perfusion is normal. The LV is mildly dilated in both rest and stress. Overall LV systolic function is mildly abnormal without regional wall motion abnormalities. Stress LV EF: 43%.  No previous exam available for comparison. Intermediate risk. Findings may suggest non ischemic cardiomyopathy.   Abdominal arteriogram and selective left renal arteriogram and lithotripsy balloon angioplasty of the suprarenal abdominal aorta using shockwave M5 catheter 02/03/2022: Abdominal aorta: 2 renal arteries 1 on either sides, mild atherosclerotic changes are present.  Abdominal aorta is diffusely diseased and calcific.  There is a high-grade 90% to 99% stenosis of the suprarenal abdominal aorta with heavy calcification. Intervention data: Successful shockwave lithotripsy using a 8.0 x 60 mm shockwave M5 balloon, gradual progression of balloon inflation pressure from 4 atmospheric pressure already up to 10 atm pressure and using a 8.43 mm lumen, followed by balloon  angioplasty with a 4.0 x 60 mm Mustang balloon at 4 atmospheric pressure. Stenosis reduced from 95% to probably insignificant stenosis (<50%).  Angiogram not performed to conserve contrast and would have been poorly visualized with CO2.   Pressure gradient pre and post:  Opening right common femoral artery pressure 52/44 mmHg.  Suprarenal thoracic aortic pressure 180/58 mmHg with a mean of 103 mmHg.  Pressure gradient of approximately 130 mmHg.  Closing right femoral artery pressure 121/62 mmHg.  Pressure gradient approximately 30 mmHg post angioplasty.  5-10 mL contrast utilized 20 to visualize abdominal aorta.  Right femoral arterial access closed with Perclose.  Recommendation: Start the patient on Plavix, I expect improvement in renal function, do not treat hypertension unless blood pressure >160 mmHg.  We will change all the antihypertensive medications.  Scheduled Meds:  [START ON 05/29/2023] amLODipine  10 mg Oral Daily   enoxaparin (LOVENOX) injection  40 mg Subcutaneous Q24H   [START ON 05/29/2023] furosemide  40 mg Intravenous BID   hydrALAZINE  50 mg Oral TID   labetalol  400 mg Oral BID   [START ON 05/29/2023] levothyroxine  100 mcg Oral Q0600   nicotine  21 mg Transdermal Daily   pantoprazole  40 mg Oral Daily   [START ON 05/29/2023] rosuvastatin  40 mg Oral Daily   sodium chloride flush  3 mL Intravenous Q12H    Continuous Infusions:  nitroGLYCERIN 25 mcg/min (05/28/23 1856)    PRN Meds: acetaminophen **OR** acetaminophen, albuterol, chlorpheniramine-HYDROcodone, hydrALAZINE  IMPRESSION & RECOMMENDATIONS: Gwendolyn [name] is a 67 y.o. Caucasian female whose past medical history and cardiovascular risk factors include: resistant hypertension, mixed hyperlipidemia, nicotine dependence, COPD, known distal aorta stenosis (s/p successful Abdominal arteriogram and selective left renal arteriogram and lithotripsy balloon angioplasty of the suprarenal abdominal aorta using  shockwave M5 catheter 02/03/2022), chronic HFpEF, hx of hypertensive emergency.  Impression:  Shortness of breath: Multifactorial (recent COVID-19 infection, acute on chronic COPD exacerbation, acute on chronic HFpEF, pulmonary edema given her blood pressure on arrival)  Hypertensive emergency  Recent COVID-19 infection  Acute on chronic heart failure with preserved EF, stage C, NYHA class III  Acute on chronic COPD exacerbation precipitated by recent COVID-19 and cigarette smoking  Hyperlipidemia  History of abdominal aortic  stenosis status post angioplasty  Carotid bruits bilaterally  Plan:  Shortness of breath: Recent COVID-19 infection-respiratory panel requested and would recommend checking COVID-19 and flu as well.  Defer to primary. COPD exacerbation management per primary. Blood pressure management-resume home medications and currently on nitro drip Acute HFpEF management as discussed below  Hypertensive emergency: Systolic blood pressures are consistently greater than 200 mmHg. Wide pulse pressure -known to have aortic regurgitation at baseline. Given her acute presentation we will still check a CT a chest abdomen pelvis to rule out dissection or other vascular pathology Resume home medications-will defer to primary. Started on IV nitro drip.  Recent COVID-19 infection: Recommend rechecking PCR COVID and flu Respiratory 20 pathogen pending Management per primary team  Acute on chronic heart failure with preserved EF: Stage C, NYHA class III. Start Lasix 40 mg IV push twice daily. Resume home medications. Echocardiogram ordered Strict I's and O's, daily weights Continue telemetry  Acute on chronic exacerbation of COPD: Likely precipitated by recent COVID-19 and continues cigarette smoking Management per primary team  Hyperlipidemia: Continue home medications.  Recommend checking fasting lipid profile.  History of abdominal aortic stenosis status post  angioplasty: CTA chest abdomen pelvis pending  Bilateral carotid bruits: Will check carotid duplex.  Patient's questions and concerns were addressed to her satisfaction. She voices understanding of the instructions provided during this encounter.   This note was created using a voice recognition software as a result there may be grammatical errors inadvertently enclosed that do not reflect the nature of this encounter. Every attempt is made to correct such errors.  Delilah Shan [facility]  Pager:  [phone] Office: [phone] 05/28/2023, 7:07 PM

## 2023-05-28 NOTE — ED Notes (Signed)
Manual bp 230/90 lt arm

## 2023-05-28 NOTE — ED Provider Notes (Signed)
[place] EMERGENCY DEPARTMENT AT [facility] Provider Note   CSN: [id number] Arrival date & time: 05/28/23  1152     History {Add pertinent medical, surgical, social history, OB history to HPI:1} Chief Complaint  Patient presents with   Shortness of Breath    Gwendolyn [name] is a 67 y.o. female.  Patient is a 67 year old female with a history of ongoing tobacco use, COPD, hypertension, hypothyroidism and GERD who is presenting today with complaint of worsening shortness of breath.  Patient tested positive for COVID 3 weeks ago and has had a persistent cough which at times is productive.  She has had intermittent fevers and reports she still having intermittent fevers now.  She followed up with her doctor completed a course of azithromycin went back for ongoing symptoms and then completed a 5-day course of amoxicillin but reports starting yesterday she started having shortness of breath.  She has been using her inhalers at home but does not feel like they are helping at all.  She does not have any chest pain, swelling in her legs, abdominal pain, nausea or vomiting but has noticed a decreased appetite in the last 24 hours.  EMS reported that patient sats were normal she does not wear oxygen at home.  She was tachypneic but breath sounds were decreased.  She did not receive any medication by EMS.  The history is provided by the patient, medical records and the EMS personnel.  Shortness of Breath      Home Medications Prior to Admission medications   Medication Sig Start Date End Date Taking? Authorizing Provider  acetaminophen (TYLENOL) 500 MG tablet Take 500 mg by mouth 2 (two) times daily as needed for headache or moderate pain.    [provider]  albuterol (VENTOLIN HFA) 108 (90 Base) MCG/ACT inhaler Inhale 2 puffs into the lungs as needed for wheezing or shortness of breath. 06/28/22   [provider]  amLODipine (NORVASC) 5 MG tablet Take 2 tablets  (10 mg total) by mouth daily. 10/21/22   Shalhoub, Deno Lunger, MD  Budeson-Glycopyrrol-Formoterol (BREZTRI AEROSPHERE) 160-9-4.8 MCG/ACT AERO Inhale 2 puffs into the lungs as needed (SOB).    [provider]  cyanocobalamin (VITAMIN B12) 1000 MCG tablet Take 1 tablet (1,000 mcg total) by mouth daily. 10/21/22   Shalhoub, Deno Lunger, MD  ferrous sulfate 325 (65 FE) MG tablet Take 1 tablet (325 mg total) by mouth daily before breakfast. 10/21/22 10/21/23  Shalhoub, Deno Lunger, MD  furosemide (LASIX) 20 MG tablet Take 20 mg by mouth daily.    [provider]  hydrALAZINE (APRESOLINE) 50 MG tablet Take 1 tablet (50 mg total) by mouth 3 (three) times daily. 10/21/22   Shalhoub, Deno Lunger, MD  HYDROcodone-acetaminophen (NORCO/VICODIN) 5-325 MG tablet Take 0.5 tablets by mouth 2 (two) times daily as needed for moderate pain.    [provider]  isosorbide mononitrate (IMDUR) 30 MG 24 hr tablet Take 30 mg by mouth daily.    [provider]  labetalol (NORMODYNE) 200 MG tablet Take 2 tablets (400 mg total) by mouth 2 (two) times daily. 10/21/22   Shalhoub, Deno Lunger, MD  levothyroxine (SYNTHROID) 100 MCG tablet Take 100 mcg by mouth daily before breakfast.    [provider]  lisinopril (ZESTRIL) 40 MG tablet Take 1 tablet (40 mg total) by mouth daily. 10/21/22   Shalhoub, Deno Lunger, MD  pantoprazole (PROTONIX) 40 MG tablet Take 1 tablet by mouth 2 times daily before a  meal for 30 days, THEN take1 tablet daily. 10/21/22 12/20/22  Shalhoub, Deno Lunger, MD  rosuvastatin (CRESTOR) 40 MG tablet Take 40 mg by mouth daily. 09/14/22   [provider]  valACYclovir (VALTREX) 1000 MG tablet Take 1,000 mg by mouth every 8 (eight) hours. 10/13/22   [provider]      Allergies    Doxycycline, Azilsartan, Biaxin [clarithromycin], Floxacillin [floxacillin (flucloxacillin)], and Morphine and codeine    Review of Systems   Review of Systems  Respiratory:  Positive for  shortness of breath.     Physical Exam Updated Vital Signs BP (!) 203/174 (BP Location: Left Arm)   Pulse 89   Temp 98.3 F (36.8 C) (Oral)   Resp (!) 22   Ht 4' 11.5" (1.511 m)   Wt 67.1 kg   LMP  (LMP Unknown)   SpO2 100%   BMI 29.39 kg/m  Physical Exam Vitals and nursing note reviewed.  Constitutional:      General: She is not in acute distress.    Appearance: She is well-developed.  HENT:     Head: Normocephalic and atraumatic.  Eyes:     Conjunctiva/sclera: Conjunctivae normal.     Pupils: Pupils are equal, round, and reactive to light.  Cardiovascular:     Rate and Rhythm: Normal rate and regular rhythm.     Heart sounds: No murmur heard. Pulmonary:     Effort: Pulmonary effort is normal. Tachypnea present. No respiratory distress.     Breath sounds: Decreased air movement present. Examination of the right-upper field reveals decreased breath sounds. Examination of the right-middle field reveals decreased breath sounds. Examination of the right-lower field reveals decreased breath sounds. Decreased breath sounds present. No rales.  Abdominal:     General: There is no distension.     Palpations: Abdomen is soft.     Tenderness: There is no abdominal tenderness. There is no guarding or rebound.  Musculoskeletal:        General: No tenderness. Normal range of motion.     Cervical back: Normal range of motion and neck supple.     Right lower leg: No edema.     Left lower leg: No edema.  Skin:    General: Skin is warm and dry.     Findings: No erythema or rash.  Neurological:     Mental Status: She is alert and oriented to person, place, and time. Mental status is at baseline.  Psychiatric:        Behavior: Behavior normal.     ED Results / Procedures / Treatments   Labs (all labs ordered are listed, but only abnormal results are displayed) Labs Reviewed  CBC WITH DIFFERENTIAL/PLATELET - Abnormal; Notable for the following components:      Result Value   RBC  3.53 (*)    Hemoglobin 10.0 (*)    HCT 30.9 (*)    Neutro Abs 8.0 (*)    All other components within normal limits  COMPREHENSIVE METABOLIC PANEL - Abnormal; Notable for the following components:   Sodium 131 (*)    Glucose, Bld 111 (*)    Calcium 8.7 (*)    All other components within normal limits  BRAIN NATRIURETIC PEPTIDE  TROPONIN I (HIGH SENSITIVITY)    EKG EKG Interpretation Date/Time:  Sunday May 28 2023 12:00:43 EDT Ventricular Rate:  92 PR Interval:  162 QRS Duration:  102 QT Interval:  395 QTC Calculation: 489 R Axis:   70  Text Interpretation: Sinus  rhythm Probable left atrial enlargement more pronounced LVH with secondary repolarization abnormality Anterior infarct, old Confirmed by Gwyneth Sprout ([id number]) on 05/28/2023 12:19:59 PM  Radiology DG Chest Port 1 View  Result Date: 05/28/2023 CLINICAL DATA:  Shortness of breath. EXAM: PORTABLE CHEST 1 VIEW COMPARISON:  10/18/2022 FINDINGS: Aortic atherosclerotic calcifications. Stable cardiac enlargement. Low lung volumes. Small bilateral pleural effusions and mild interstitial edema. Scar versus atelectasis in the lateral left base. IMPRESSION: Cardiomegaly, small bilateral pleural effusions and mild interstitial edema. Correlate for any signs or symptoms of CHF. Electronically Signed   By: Signa Kell M.D.   On: 05/28/2023 13:03    Procedures Procedures  {Document cardiac monitor, telemetry assessment procedure when appropriate:1}  Medications Ordered in ED Medications  predniSONE (DELTASONE) tablet 60 mg (has no administration in time range)  albuterol (PROVENTIL) (2.5 MG/3ML) 0.083% nebulizer solution 5 mg (5 mg Nebulization Given 05/28/23 1228)  ipratropium (ATROVENT) nebulizer solution 0.5 mg (0.5 mg Nebulization Given 05/28/23 1228)  hydrALAZINE (APRESOLINE) tablet 50 mg (50 mg Oral Given 05/28/23 1228)    ED Course/ Medical Decision Making/ A&P   {   Click here for ABCD2, HEART and other  calculatorsREFRESH Note before signing :1}                          Medical Decision Making Amount and/or Complexity of Data Reviewed Labs: ordered. Radiology: ordered.  Risk Prescription drug management.   Pt with multiple medical problems and comorbidities and presenting today with a complaint that caries a high risk for morbidity and mortality.  Here today with complaint of shortness of breath that has been present for the last 2 days in the setting of COVID-positive 3 weeks ago with ongoing coughing.  Patient did complete azithromycin and then 5 days of amoxicillin without significant improvement.  Patient is tachypneic here has decreased breath sounds but does not have significant wheezing.  She does have a history of COPD and concern for COPD exacerbation in the setting of recent viral infection however also concern for pneumonia.  Patient has no evidence of fluid overload and low suspicion for CHF, pleural effusion, cancer.  Patient has no prior history of PE has no unilateral leg pain or swelling and low suspicion for clot at this time.  Patient is hypertensive here has significant history of hypertension on multiple antihypertensives which she has taken her morning dose but typically gets 50 mg of hydralazine at noon which she will be given here.  She is denying any chest pain and low suspicion for ACS at this time.  I independently interpreted patient's EKG which does show findings of LVH which are more pronounced than prior EKGs but no other specific findings concerning for ACS.  Patient given albuterol and Atrovent and will reassess.  Labs and imaging are pending.   1:41 PM I independently interpreted patient's labs and CBC today with stable hemoglobin of 10, CMP with mild hyponatremia of 131, normal LFTs and renal function.  I have independently visualized and interpreted pt's images today.  Chest x-ray today with cardiomegaly unchanged from prior, small pleural effusion.  Radiology  reports some mild interstitial edema concerns for possible CHF.  Patient does take 20 mg of Lasix daily for at least the last 6 months but denies a history of CHF.  She is not having any lower extremity swelling however with recent COVID and concern for a possible bronchitis and inflammation versus possible early CHF.  Last echo  in 2023 showed EF of 40-45% with LVH.  Troponin and BNP are pending.  Patient was given oral prednisone.  With albuterol and Atrovent breath sounds are slightly improved patient still complains of feeling short of breath.   {Document critical care time when appropriate:1} {Document review of labs and clinical decision tools ie heart score, Chads2Vasc2 etc:1}  {Document your independent review of radiology images, and any outside records:1} {Document your discussion with family members, caretakers, and with consultants:1} {Document social determinants of health affecting pt's care:1} {Document your decision making why or why not admission, treatments were needed:1} Final Clinical Impression(s) / ED Diagnoses Final diagnoses:  None    Rx / DC Orders ED Discharge Orders     None

## 2023-05-29 ENCOUNTER — Inpatient Hospital Stay (HOSPITAL_COMMUNITY): Payer: Medicare Other

## 2023-05-29 DIAGNOSIS — I350 Nonrheumatic aortic (valve) stenosis: Secondary | ICD-10-CM | POA: Insufficient documentation

## 2023-05-29 DIAGNOSIS — I5031 Acute diastolic (congestive) heart failure: Secondary | ICD-10-CM | POA: Diagnosis not present

## 2023-05-29 DIAGNOSIS — F17210 Nicotine dependence, cigarettes, uncomplicated: Secondary | ICD-10-CM | POA: Insufficient documentation

## 2023-05-29 DIAGNOSIS — I1A0 Resistant hypertension: Secondary | ICD-10-CM | POA: Insufficient documentation

## 2023-05-29 DIAGNOSIS — R0989 Other specified symptoms and signs involving the circulatory and respiratory systems: Secondary | ICD-10-CM

## 2023-05-29 DIAGNOSIS — I701 Atherosclerosis of renal artery: Secondary | ICD-10-CM | POA: Insufficient documentation

## 2023-05-29 DIAGNOSIS — I5022 Chronic systolic (congestive) heart failure: Secondary | ICD-10-CM | POA: Diagnosis not present

## 2023-05-29 LAB — BASIC METABOLIC PANEL
Anion gap: 8 (ref 5–15)
BUN: 13 mg/dL (ref 8–23)
CO2: 24 mmol/L (ref 22–32)
Calcium: 8.9 mg/dL (ref 8.9–10.3)
Chloride: 99 mmol/L (ref 98–111)
Creatinine, Ser: 0.95 mg/dL (ref 0.44–1.00)
GFR, Estimated: >60 mL/min (ref >60)
Glucose, Bld: 147 mg/dL — ABNORMAL HIGH (ref 70–99)
Potassium: 3.3 mmol/L — ABNORMAL LOW (ref 3.5–5.1)
Sodium: 131 mmol/L — ABNORMAL LOW (ref 135–145)

## 2023-05-29 LAB — ECHOCARDIOGRAM COMPLETE
AR max vel: 1.22 cm2
AV Area VTI: 1.18 cm2
AV Area mean vel: 1.18 cm2
AV Mean grad: 18 mmHg
AV Peak grad: 36 mmHg
Ao pk vel: 3 m/s
Area-P 1/2: 3.31 cm2
Height: 61 in
P 1/2 time: 350 msec
S' Lateral: 3.7 cm
Weight: 2479.73 oz

## 2023-05-29 LAB — CBC
HCT: 27.8 % — ABNORMAL LOW (ref 36.0–46.0)
Hemoglobin: 9 g/dL — ABNORMAL LOW (ref 12.0–15.0)
MCH: 27.9 pg (ref 26.0–34.0)
MCHC: 32.4 g/dL (ref 30.0–36.0)
MCV: 86.1 fL (ref 80.0–100.0)
Platelets: 247 10*3/uL (ref 150–400)
RBC: 3.23 MIL/uL — ABNORMAL LOW (ref 3.87–5.11)
RDW: 13.9 % (ref 11.5–15.5)
WBC: 7 10*3/uL (ref 4.0–10.5)
nRBC: 0 % (ref 0.0–0.2)

## 2023-05-29 LAB — BRAIN NATRIURETIC PEPTIDE: B Natriuretic Peptide: 1993 pg/mL — ABNORMAL HIGH (ref 0.0–100.0)

## 2023-05-29 MED ORDER — DAPAGLIFLOZIN PROPANEDIOL 10 MG PO TABS
10.0000 mg | ORAL_TABLET | Freq: Every day | ORAL | Status: DC
  Administered 2023-05-29 – 2023-06-04 (×7): 10 mg via ORAL
  Filled 2023-05-29 (×7): qty 1

## 2023-05-29 MED ORDER — OXYCODONE HCL 5 MG PO TABS
5.0000 mg | ORAL_TABLET | Freq: Three times a day (TID) | ORAL | Status: AC | PRN
  Administered 2023-05-29 – 2023-05-30 (×3): 5 mg via ORAL
  Filled 2023-05-29 (×3): qty 1

## 2023-05-29 MED ORDER — BUDESONIDE 0.25 MG/2ML IN SUSP
0.2500 mg | Freq: Two times a day (BID) | RESPIRATORY_TRACT | Status: DC
  Administered 2023-05-29 – 2023-06-04 (×13): 0.25 mg via RESPIRATORY_TRACT
  Filled 2023-05-29 (×13): qty 2

## 2023-05-29 MED ORDER — OXYCODONE HCL 5 MG PO TABS
5.0000 mg | ORAL_TABLET | Freq: Once | ORAL | Status: AC
  Administered 2023-05-29: 5 mg via ORAL
  Filled 2023-05-29: qty 1

## 2023-05-29 MED ORDER — ONDANSETRON HCL 4 MG/2ML IJ SOLN
4.0000 mg | Freq: Three times a day (TID) | INTRAMUSCULAR | Status: DC | PRN
  Administered 2023-05-29 – 2023-06-03 (×6): 4 mg via INTRAVENOUS
  Filled 2023-05-29 (×6): qty 2

## 2023-05-29 MED ORDER — IPRATROPIUM-ALBUTEROL 0.5-2.5 (3) MG/3ML IN SOLN
3.0000 mL | Freq: Four times a day (QID) | RESPIRATORY_TRACT | Status: DC
  Administered 2023-05-29: 3 mL via RESPIRATORY_TRACT
  Filled 2023-05-29: qty 3

## 2023-05-29 MED ORDER — SPIRONOLACTONE 25 MG PO TABS: 25.0000 mg | ORAL_TABLET | Freq: Every day | ORAL | Status: DC

## 2023-05-29 MED ORDER — SODIUM CHLORIDE 0.9 % IV SOLN
8.0000 mg | Freq: Once | INTRAVENOUS | Status: AC
  Administered 2023-05-29: 8 mg via INTRAVENOUS
  Filled 2023-05-29: qty 4

## 2023-05-29 MED ORDER — POTASSIUM CHLORIDE CRYS ER 20 MEQ PO TBCR
40.0000 meq | EXTENDED_RELEASE_TABLET | Freq: Two times a day (BID) | ORAL | Status: AC
  Administered 2023-05-29: 40 meq via ORAL
  Filled 2023-05-29 (×2): qty 2

## 2023-05-29 MED ORDER — METHYLPREDNISOLONE SODIUM SUCC 125 MG IJ SOLR
80.0000 mg | Freq: Every day | INTRAMUSCULAR | Status: DC
  Administered 2023-05-29 – 2023-05-31 (×3): 80 mg via INTRAVENOUS
  Filled 2023-05-29 (×3): qty 2

## 2023-05-29 MED ORDER — ASPIRIN 81 MG PO TBEC
81.0000 mg | DELAYED_RELEASE_TABLET | Freq: Every day | ORAL | Status: DC
  Administered 2023-05-29: 81 mg via ORAL
  Filled 2023-05-29: qty 1

## 2023-05-29 MED ORDER — IPRATROPIUM-ALBUTEROL 0.5-2.5 (3) MG/3ML IN SOLN
3.0000 mL | Freq: Two times a day (BID) | RESPIRATORY_TRACT | Status: DC
  Administered 2023-05-29 – 2023-06-04 (×12): 3 mL via RESPIRATORY_TRACT
  Filled 2023-05-29 (×12): qty 3

## 2023-05-29 MED ORDER — LOSARTAN POTASSIUM 50 MG PO TABS
50.0000 mg | ORAL_TABLET | Freq: Every day | ORAL | Status: DC
  Administered 2023-05-29: 50 mg via ORAL
  Filled 2023-05-29: qty 1

## 2023-05-29 MED ORDER — ARFORMOTEROL TARTRATE 15 MCG/2ML IN NEBU
15.0000 ug | INHALATION_SOLUTION | Freq: Two times a day (BID) | RESPIRATORY_TRACT | Status: DC
  Administered 2023-05-29 – 2023-06-04 (×13): 15 ug via RESPIRATORY_TRACT
  Filled 2023-05-29 (×14): qty 2

## 2023-05-29 MED ORDER — HYDRALAZINE HCL 50 MG PO TABS
100.0000 mg | ORAL_TABLET | Freq: Three times a day (TID) | ORAL | Status: DC
  Administered 2023-05-29 – 2023-06-04 (×19): 100 mg via ORAL
  Filled 2023-05-29 (×19): qty 2

## 2023-05-29 NOTE — Progress Notes (Signed)
Echocardiogram 2D Echocardiogram has been performed.  Warren Lacy Chasitee Zenker RDCS 05/29/2023, 9:56 AM

## 2023-05-29 NOTE — Progress Notes (Signed)
VASCULAR LAB    Carotid duplex has been performed.  See CV proc for preliminary results.   Shali Vesey, RVT 05/29/2023, 10:56 AM

## 2023-05-29 NOTE — Progress Notes (Signed)
Pt has been on a Nitroglycerin drip since earlier before change of shift tonight for BP control, her BP 's before the drip started in the 200's.  While the drip was going on her BP went down to 140-150's and pt also had her dose of Hydralazine and Labetolol to help with this.  Pt's BP was fairly stable for a while then later throughout the night pt went back into the 170-190's, RN went up to a total 60 mcg/min to help with this.  MD called this morning and ordered to give her Labetolol and Norvasc dose this morning and to go up to a total of 65 mcg/min on the drip.  Will continue to monitor, Thanks, Lavonda Jumbo RN

## 2023-05-29 NOTE — Plan of Care (Signed)
  Problem: Education: Goal: Ability to verbalize understanding of medication therapies will improve Outcome: Progressing   Problem: Education: Goal: Ability to demonstrate management of disease process will improve Outcome: Progressing

## 2023-05-29 NOTE — Progress Notes (Signed)
Progress Note  Patient Name: Gwendolyn [name] MRN: [id number] DOB: 04-04-1956 Date of Encounter: 05/29/2023  Attending physician: Briant Cedar, MD Primary care provider: Ralene Ok, MD Primary Cardiologist: Dr. Jacinto Halim  Subjective: Gwendolyn [name] is a 67 y.o. Caucasian female who was seen and examined at bedside  Shortness of breath is improving. Blood pressures currently improving. Complains of headache likely secondary to nitro. Son at bedside. No events overnight. Case discussed and reviewed with her nurse.  Objective: Vital Signs in the last 24 hours: Temp:  [98.4 F (36.9 C)-99.3 F (37.4 C)] 99.3 F (37.4 C) (07/22 1605) Pulse Rate:  [75-98] 95 (07/22 1605) Resp:  [14-34] 34 (07/22 1605) BP: (168-228)/(49-77) 182/77 (07/22 1605) SpO2:  [87 %-98 %] 91 % (07/22 1605) Weight:  [69.3 kg-70.3 kg] 70.3 kg (07/22 0400)  Intake/Output:  Intake/Output Summary (Last 24 hours) at 05/29/2023 1758 Last data filed at 05/29/2023 1727 Gross per 24 hour  Intake 517.79 ml  Output 1120 ml  Net -602.21 ml    Net IO Since Admission: -602.21 mL [05/29/23 1758]  Weights:     05/29/2023    4:00 AM 05/28/2023    6:47 PM 05/28/2023   12:03 PM  Last 3 Weights  Weight (lbs) 154 lb 15.7 oz 152 lb 12.5 oz 148 lb  Weight (kg) 70.3 kg 69.3 kg 67.132 kg      Telemetry:  Overnight telemetry shows sinus, which I personally reviewed.   Physical examination: PHYSICAL EXAM: Vitals:   05/29/23 1200 05/29/23 1215 05/29/23 1226 05/29/23 1605  BP: (!) 211/61 (!) 177/65  (!) 182/77  Pulse: 89 92 92 95  Resp: (!) 26  18 (!) 34  Temp: 99 F (37.2 C)   99.3 F (37.4 C)  TempSrc: Oral     SpO2: 91%  93% 91%  Weight:      Height:        Physical Exam  Constitutional: No distress.  Age appropriate, hemodynamically stable.   Neck: JVD present.  Cardiovascular: Normal rate, regular rhythm, S1 normal, S2 normal, intact distal pulses and normal pulses. Exam reveals no gallop, no  S3 and no S4.  Murmur heard. Decrescendo diastolic murmur is present with a grade of 3/4 at the upper right sternal border radiating to the apex. Pulses:      Carotid pulses are  on the right side with bruit and  on the left side with bruit. Pulmonary/Chest: Effort normal. No stridor. She has no rales. She has diffuse wheezes.  Abdominal: Soft. Bowel sounds are normal. She exhibits no distension. There is no abdominal tenderness.  Musculoskeletal:        General: No edema.     Cervical back: Neck supple.  Neurological: She is alert and oriented to person, place, and time. She has intact cranial nerves (2-12).  Skin: Skin is warm and moist.     Lab Results: Chemistry Recent Labs  Lab 05/28/23 1240 05/29/23 0031  NA 131* 131*  K 3.5 3.3*  CL 102 99  CO2 22 24  GLUCOSE 111* 147*  BUN 12 13  CREATININE 0.78 0.95  CALCIUM 8.7* 8.9  PROT 6.6  --   ALBUMIN 4.0  --   AST 15  --   ALT 14  --   ALKPHOS 54  --   BILITOT 0.4  --   GFRNONAA >60 >60  ANIONGAP 7 8    Hematology Recent Labs  Lab 05/28/23 1240 05/29/23 0031  WBC 10.0 7.0  RBC  3.53* 3.23*  HGB 10.0* 9.0*  HCT 30.9* 27.8*  MCV 87.5 86.1  MCH 28.3 27.9  MCHC 32.4 32.4  RDW 14.1 13.9  PLT 251 247   High Sensitivity Troponin:   Recent Labs  Lab 05/28/23 1420  TROPONINIHS 16     Cardiac EnzymesNo results for input(s): "TROPONINI" in the last 168 hours. No results for input(s): "TROPIPOC" in the last 168 hours.  BNP Recent Labs  Lab 05/28/23 1420 05/29/23 0031  BNP 1,292.5* 1,993.0*    DDimer No results for input(s): "DDIMER" in the last 168 hours.  Hemoglobin A1c: No results found for: "HGBA1C", "MPG" TSH  Recent Labs    05/28/23 1240  TSH 1.427   Lipid Panel  Lab Results  Component Value Date   CHOL 126 02/04/2022   HDL 27 (L) 02/04/2022   LDLCALC 70 02/04/2022   TRIG 147 02/04/2022   CHOLHDL 4.7 02/04/2022   Drugs of Abuse  No results found for: "LABOPIA", "COCAINSCRNUR", "LABBENZ",  "AMPHETMU", "THCU", "LABBARB"    Imaging: VAS US CAROTID  Result Date: 05/29/2023 Carotid Arterial Duplex Study Patient Name:  Gwendolyn [name]  Date of Exam:   05/29/2023 Medical Rec #: [id number]           Accession #:    [id number] Date of Birth: [birth year]          Patient Gender: F Patient Age:   51 years Exam Location:  [facility] Procedure:      VAS US CAROTID Referring Phys: Tessa Lerner --------------------------------------------------------------------------------  Indications:       Bilateral bruits. Risk Factors:      Hypertension, hyperlipidemia, current smoker, PAD. Other Factors:     COPD, severe atherosclerotic calcification resulting in sever                    stenosis of the aorta and focal stenosis of the celiac and                    origin of the right renal artery. Comparison Study:  No prior study on file Performing Technologist: Sherren Kerns RVS  Examination Guidelines: A complete evaluation includes B-mode imaging, spectral Doppler, color Doppler, and power Doppler as needed of all accessible portions of each vessel. Bilateral testing is considered an integral part of a complete examination. Limited examinations for reoccurring indications may be performed as noted.  Right Carotid Findings: +----------+--------+--------+--------+------------------+--------+           PSV cm/sEDV cm/sStenosisPlaque DescriptionComments +----------+--------+--------+--------+------------------+--------+ CCA Prox  148     23              heterogenous               +----------+--------+--------+--------+------------------+--------+ CCA Distal133     20              heterogenous               +----------+--------+--------+--------+------------------+--------+ ICA Prox  192     31      1-39%   heterogenous               +----------+--------+--------+--------+------------------+--------+ ICA Mid   210     35                                          +----------+--------+--------+--------+------------------+--------+ ICA Distal118     29  tortuous +----------+--------+--------+--------+------------------+--------+ ECA       190     6                                          +----------+--------+--------+--------+------------------+--------+ +----------+--------+-------+---------+-------------------+           PSV cm/sEDV cmsDescribe Arm Pressure (mmHG) +----------+--------+-------+---------+-------------------+ [id number]            Turbulent                    +----------+--------+-------+---------+-------------------+ +---------+--------+---+--------+--+--------+ VertebralPSV cm/s172EDV cm/s23Atypical +---------+--------+---+--------+--+--------+  Left Carotid Findings: +----------+--------+--------+--------+------------------+------------------+           PSV cm/sEDV cm/sStenosisPlaque DescriptionComments           +----------+--------+--------+--------+------------------+------------------+ CCA Prox  131     10                                intimal thickening +----------+--------+--------+--------+------------------+------------------+ CCA Distal107     17                                intimal thickening +----------+--------+--------+--------+------------------+------------------+ ICA Prox  192     26      1-39%   calcific                             +----------+--------+--------+--------+------------------+------------------+ ICA Mid   168     27                                                   +----------+--------+--------+--------+------------------+------------------+ ICA Distal78      16                                tortuous           +----------+--------+--------+--------+------------------+------------------+ ECA       360                                                           +----------+--------+--------+--------+------------------+------------------+ +----------+--------+--------+--------+-------------------+           PSV cm/sEDV cm/sDescribeArm Pressure (mmHG) +----------+--------+--------+--------+-------------------+ [id number]                                         +----------+--------+--------+--------+-------------------+ +---------+--------+---+--------+--+ VertebralPSV cm/s105EDV cm/s15 +---------+--------+---+--------+--+   Summary: Right Carotid: Velocities in the right ICA are consistent with a 1-39% stenosis. Left Carotid: Velocities in the left ICA are consistent with a 1-39% stenosis. Vertebrals:  Bilateral vertebral arteries demonstrate antegrade flow. Subclavians: Right subclavian artery flow was disturbed. Normal flow              hemodynamics were seen in the left subclavian artery. *See table(s) above for measurements and observations.  Electronically signed by Delia Heady MD on 05/29/2023  at 1:53:06 PM.    Final    ECHOCARDIOGRAM COMPLETE  Result Date: 05/29/2023    ECHOCARDIOGRAM REPORT   Patient Name:   LATISE DILLEY Date of Exam: 05/29/2023 Medical Rec #:  [id number]          Height:       61.0 in Accession #:    [id number]         Weight:       155.0 lb Date of Birth:  23-May-1956         BSA:          1.695 m Patient Age:    66 years           BP:           159/72 mmHg Patient Gender: F                  HR:           73 bpm. Exam Location:  Inpatient Procedure: 2D Echo, Color Doppler and Cardiac Doppler Indications:    I50.31 Acute diastolic (congestive) heart failure  History:        Patient has prior history of Echocardiogram examinations, most                 recent 02/05/2022. CHF, COPD; Risk Factors:Hypertension and                 Dyslipidemia.  Sonographer:    Irving Burton Senior RDCS Referring Phys: [phone] RONDELL A SMITH  Sonographer Comments: Suboptimal apical window due to COPD, wall motion obtained from short axis. IMPRESSIONS   1. Left ventricular ejection fraction, by estimation, is 50 to 55%. The left ventricle has low normal function. The left ventricle has no regional wall motion abnormalities. There is moderate left ventricular hypertrophy. Left ventricular diastolic parameters are consistent with Grade II diastolic dysfunction (pseudonormalization). Elevated left atrial pressure.  2. Right ventricular systolic function is normal. The right ventricular size is normal. Tricuspid regurgitation signal is inadequate for assessing PA pressure.  3. Left atrial size was severely dilated.  4. The mitral valve is normal in structure. No evidence of mitral valve regurgitation.  5. The aortic valve was not well visualized. Aortic valve regurgitation is mild. Moderate aortic valve stenosis. Vmax 3.0 m/s, MG , AVA 1.2 cm^2, DI 0.43  6. The inferior vena cava is dilated in size with <50% respiratory variability, suggesting right atrial pressure of 15 mmHg. FINDINGS  Left Ventricle: Left ventricular ejection fraction, by estimation, is 50 to 55%. The left ventricle has low normal function. The left ventricle has no regional wall motion abnormalities. The left ventricular internal cavity size was normal in size. There is moderate left ventricular hypertrophy. Left ventricular diastolic parameters are consistent with Grade II diastolic dysfunction (pseudonormalization). Elevated left atrial pressure. Right Ventricle: The right ventricular size is normal. No increase in right ventricular wall thickness. Right ventricular systolic function is normal. Tricuspid regurgitation signal is inadequate for assessing PA pressure. Left Atrium: Left atrial size was severely dilated. Right Atrium: Right atrial size was normal in size. Pericardium: Trivial pericardial effusion is present. Mitral Valve: The mitral valve is normal in structure. No evidence of mitral valve regurgitation. Tricuspid Valve: The tricuspid valve is normal in structure. Tricuspid valve  regurgitation is trivial. Aortic Valve: The aortic valve was not well visualized. Aortic valve regurgitation is mild. Aortic regurgitation PHT measures 350 msec. Moderate aortic stenosis is present. Aortic valve mean gradient measures 18.0 mmHg.  Aortic valve peak gradient measures 36.0 mmHg. Aortic valve area, by VTI measures 1.18 cm. Pulmonic Valve: The pulmonic valve was not well visualized. Pulmonic valve regurgitation is trivial. Aorta: The aortic root and ascending aorta are structurally normal, with no evidence of dilitation. Venous: The inferior vena cava is dilated in size with less than 50% respiratory variability, suggesting right atrial pressure of 15 mmHg. IAS/Shunts: The interatrial septum was not well visualized.  LEFT VENTRICLE PLAX 2D LVIDd:         5.10 cm   Diastology LVIDs:         3.70 cm   LV e' medial:    5.44 cm/s LV PW:         1.50 cm   LV E/e' medial:  19.5 LV IVS:        1.20 cm   LV e' lateral:   4.24 cm/s LVOT diam:     1.90 cm   LV E/e' lateral: 25.0 LV SV:         72 LV SV Index:   43 LVOT Area:     2.84 cm  RIGHT VENTRICLE RV S prime:     13.50 cm/s TAPSE (M-mode): 2.4 cm LEFT ATRIUM             Index        RIGHT ATRIUM           Index LA diam:        4.40 cm 2.60 cm/m   RA Area:     16.90 cm LA Vol (A2C):   94.7 ml 55.88 ml/m  RA Volume:   41.20 ml  24.31 ml/m LA Vol (A4C):   84.7 ml 49.98 ml/m LA Biplane Vol: 92.8 ml 54.76 ml/m  AORTIC VALVE AV Area (Vmax):    1.22 cm AV Area (Vmean):   1.18 cm AV Area (VTI):     1.18 cm AV Vmax:           300.00 cm/s AV Vmean:          194.000 cm/s AV VTI:            0.612 m AV Peak Grad:      36.0 mmHg AV Mean Grad:      18.0 mmHg LVOT Vmax:         129.00 cm/s LVOT Vmean:        80.800 cm/s LVOT VTI:          0.255 m LVOT/AV VTI ratio: 0.42 AI PHT:            350 msec  AORTA Ao Root diam: 2.90 cm Ao Asc diam:  3.30 cm MITRAL VALVE MV Area (PHT): 3.31 cm     SHUNTS MV Decel Time: 229 msec     Systemic VTI:  0.26 m MV E velocity:  106.00 cm/s  Systemic Diam: 1.90 cm MV A velocity: 94.40 cm/s MV E/A ratio:  1.12 Epifanio Lesches MD Electronically signed by Epifanio Lesches MD Signature Date/Time: 05/29/2023/1:16:29 PM    Final    CT Angio Chest/Abd/Pel for Dissection W and/or W/WO  Result Date: 05/28/2023 CLINICAL DATA:  Acute aortic syndrome. EXAM: CT ANGIOGRAPHY CHEST, ABDOMEN AND PELVIS TECHNIQUE: Non-contrast CT of the chest was initially obtained. Multidetector CT imaging through the chest, abdomen and pelvis was performed using the standard protocol during bolus administration of intravenous contrast. Multiplanar reconstructed images and MIPs were obtained and reviewed to evaluate the vascular anatomy. RADIATION DOSE REDUCTION: This exam was  performed according to the departmental dose-optimization program which includes automated exposure control, adjustment of the mA and/or kV according to patient size and/or use of iterative reconstruction technique. CONTRAST:  OMNIPAQUE IOHEXOL 350 MG/ML SOLN COMPARISON:  CT abdomen and pelvis 08/27/2021 FINDINGS: CTA CHEST FINDINGS Cardiovascular: Preferential opacification of the thoracic aorta. No evidence of thoracic aortic aneurysm or dissection. Heart is mildly enlarged. There are moderate calcified atherosclerotic plaque throughout the aorta. Moderate severe focal stenosis identified of the origins of the left subclavian and left common carotid artery secondary to calcified atherosclerotic disease. No pericardial effusion. Mediastinum/Nodes: There are enlarged paratracheal lymph nodes measuring up to 12 mm short axis. There are enlarged prevascular lymph nodes measuring up to 10 mm short axis. Enlarged subcarinal lymph node measures 15 mm short axis. Esophagus is within normal limits. The thyroid gland is not well seen. Lungs/Pleura: There are small bilateral pleural effusions with compressive atelectasis in the bilateral lower lobes. There is also small amount of atelectasis  in the lingula. No pneumothorax. Trachea and central airways are patent. Musculoskeletal: No chest wall abnormality. No acute or significant osseous findings. Review of the MIP images confirms the above findings. CTA ABDOMEN AND PELVIS FINDINGS VASCULAR Aorta: Severe atherosclerotic calcifications are again seen. This results in severe stenosis of the aorta in the proximal abdominal aorta at the level of the celiac axis, superior mesenteric arteries and renal arteries. Infrarenal abdominal aorta is nondilated. There is no evidence for dissection. Celiac: There is moderate severe focal stenosis of the origin of the celiac artery. The celiac artery and branches are otherwise within normal limits. SMA: Patent without evidence of aneurysm, dissection, vasculitis or significant stenosis. Renals: Moderate severe focal stenosis of the origin of the right renal artery. Left renal artery within normal limits. Both arteries are patent. No aneurysm or thrombosis. IMA: Patent without evidence of aneurysm, dissection, vasculitis or significant stenosis. Inflow: Patent without evidence of aneurysm, dissection, vasculitis or significant stenosis. Mild calcified atherosclerotic disease present. Veins: No obvious venous abnormality within the limitations of this arterial phase study. Review of the MIP images confirms the above findings. NON-VASCULAR Hepatobiliary: No focal liver abnormality is seen. Status post cholecystectomy. No biliary dilatation. Pancreas: Unremarkable. No pancreatic ductal dilatation or surrounding inflammatory changes. Spleen: Normal in size without focal abnormality. Adrenals/Urinary Tract: Bilateral renal calculi are present including right inferior pole renal calyceal calculus measuring 13 x 10 by 6 mm. There is no hydronephrosis or perinephric fat stranding. The adrenal glands are within normal limits. The bladder is within normal limits. Stomach/Bowel: Stomach is within normal limits. Appendix is not  seen. No evidence of bowel wall thickening, distention, or inflammatory changes. Lymphatic: There are nonenlarged upper abdominal and retroperitoneal lymph nodes similar to prior. Reproductive: Status post hysterectomy. No adnexal masses. Other: There is trace free fluid in the pelvis. Musculoskeletal: No fracture is seen. Review of the MIP images confirms the above findings. IMPRESSION: 1. No evidence for aortic dissection or aneurysm. 2. Stable severe atherosclerotic disease of the abdominal aorta resulting in severe stenosis of the proximal abdominal aorta at the level of the celiac axis, superior mesenteric artery and renal arteries. 3. Moderate severe focal stenosis of the origins of the left subclavian and left common carotid arteries. 4. Moderate severe focal stenosis of the origin of the celiac artery. 5. Moderate severe focal stenosis of the origin of the right renal artery. 6. Small bilateral pleural effusions with compressive atelectasis. 7. Mediastinal lymphadenopathy of uncertain etiology. 8. Trace free fluid  in the pelvis. 9. Nonobstructing bilateral renal calculi. Electronically Signed   By: Darliss Cheney M.D.   On: 05/28/2023 22:39   DG Chest Port 1 View  Result Date: 05/28/2023 CLINICAL DATA:  Shortness of breath. EXAM: PORTABLE CHEST 1 VIEW COMPARISON:  10/18/2022 FINDINGS: Aortic atherosclerotic calcifications. Stable cardiac enlargement. Low lung volumes. Small bilateral pleural effusions and mild interstitial edema. Scar versus atelectasis in the lateral left base. IMPRESSION: Cardiomegaly, small bilateral pleural effusions and mild interstitial edema. Correlate for any signs or symptoms of CHF. Electronically Signed   By: Signa Kell M.D.   On: 05/28/2023 13:03    CARDIAC DATABASE: EKG: May 28, 2023: Sinus rhythm, 92 bpm, ST-T changes in the lateral leads consider LVH with repolarization abnormality but ischemia cannot be ruled out.    Echocardiogram: 02/03/2022:  1. Left  ventricular ejection fraction, by estimation, is 50 to 55%. The  left ventricle has low normal function. The left ventricle has no regional  wall motion abnormalities. There is mild left ventricular hypertrophy.  Left ventricular diastolic  parameters are consistent with Grade II diastolic dysfunction  (pseudonormalization).   2. Right ventricular systolic function is normal. The right ventricular  size is normal.   3. Left atrial size was severely dilated.   4. The mitral valve is normal in structure. Mild mitral valve  regurgitation. No evidence of mitral stenosis.   5. The aortic valve is tricuspid. Aortic valve regurgitation is moderate.   6. The inferior vena cava is normal in size with <50% respiratory  variability, suggesting right atrial pressure of 8 mmHg.   Comparison(s): A prior study was performed on 09/09/2022. No significant  change.   05/29/2023:  1. Left ventricular ejection fraction, by estimation, is 50 to 55%. The  left ventricle has low normal function. The left ventricle has no regional  wall motion abnormalities. There is moderate left ventricular hypertrophy.  Left ventricular diastolic  parameters are consistent with Grade II diastolic dysfunction  (pseudonormalization). Elevated left atrial pressure.   2. Right ventricular systolic function is normal. The right ventricular  size is normal. Tricuspid regurgitation signal is inadequate for assessing  PA pressure.   3. Left atrial size was severely dilated.   4. The mitral valve is normal in structure. No evidence of mitral valve  regurgitation.   5. The aortic valve was not well visualized. Aortic valve regurgitation  is mild. Moderate aortic valve stenosis. Vmax 3.0 m/s, MG , AVA 1.2  cm^2, DI 0.43   6. The inferior vena cava is dilated in size with <50% respiratory  variability, suggesting right atrial pressure of 15 mmHg.    Stress Testing:  Exercise nuclear stress test 03/28/2022: Normal ECG  stress. The patient exercised for 2 minutes and 59 seconds of a Bruce protocol, achieving approximately 4.64 METs and achieved 88% MPHR. Stress terminated due to fatigue.  The heart rate response was accelerated. Exercise capacity was severely reduced.   The baseline blood pressure was 200/70 mmHg and increased to 230/76 mmHg, which is a normal response to exercise. Myocardial perfusion is normal. The LV is mildly dilated in both rest and stress. Overall LV systolic function is mildly abnormal without regional wall motion abnormalities. Stress LV EF: 43%.  No previous exam available for comparison. Intermediate risk. Findings may suggest non ischemic cardiomyopathy.    Abdominal arteriogram and selective left renal arteriogram and lithotripsy balloon angioplasty of the suprarenal abdominal aorta using shockwave M5 catheter 02/03/2022: Abdominal aorta: 2 renal arteries 1  on either sides, mild atherosclerotic changes are present.  Abdominal aorta is diffusely diseased and calcific.  There is a high-grade 90% to 99% stenosis of the suprarenal abdominal aorta with heavy calcification. Intervention data: Successful shockwave lithotripsy using a 8.0 x 60 mm shockwave M5 balloon, gradual progression of balloon inflation pressure from 4 atmospheric pressure already up to 10 atm pressure and using a 8.43 mm lumen, followed by balloon angioplasty with a 4.0 x 60 mm Mustang balloon at 4 atmospheric pressure. Stenosis reduced from 95% to probably insignificant stenosis (<50%).  Angiogram not performed to conserve contrast and would have been poorly visualized with CO2.   Pressure gradient pre and post:  Opening right common femoral artery pressure 52/44 mmHg.  Suprarenal thoracic aortic pressure 180/58 mmHg with a mean of 103 mmHg.  Pressure gradient of approximately 130 mmHg.  Closing right femoral artery pressure 121/62 mmHg.  Pressure gradient approximately 30 mmHg post angioplasty.  5-10 mL contrast utilized  20 to visualize abdominal aorta.  Right femoral arterial access closed with Perclose.  Recommendation: Start the patient on Plavix, I expect improvement in renal function, do not treat hypertension unless blood pressure >160 mmHg.  We will change all the antihypertensive medications.  Scheduled Meds:  amLODipine  10 mg Oral Daily   arformoterol  15 mcg Nebulization BID   aspirin EC  81 mg Oral Daily   budesonide (PULMICORT) nebulizer solution  0.25 mg Nebulization BID   dapagliflozin propanediol  10 mg Oral Daily   enoxaparin (LOVENOX) injection  40 mg Subcutaneous Q24H   furosemide  40 mg Intravenous BID   hydrALAZINE  100 mg Oral TID   ipratropium-albuterol  3 mL Nebulization BID   labetalol  400 mg Oral BID   levothyroxine  100 mcg Oral Q0600   losartan  50 mg Oral Daily   methylPREDNISolone (SOLU-MEDROL) injection  80 mg Intravenous Daily   nicotine  21 mg Transdermal Daily   pantoprazole  40 mg Oral Daily   potassium chloride  40 mEq Oral BID   rosuvastatin  40 mg Oral Daily   sodium chloride flush  3 mL Intravenous Q12H   spironolactone  25 mg Oral Daily    Continuous Infusions:  nitroGLYCERIN 65 mcg/min (05/29/23 1506)    PRN Meds: acetaminophen **OR** acetaminophen, albuterol, chlorpheniramine-HYDROcodone, hydrALAZINE, ondansetron (ZOFRAN) IV, oxyCODONE   IMPRESSION & RECOMMENDATIONS: EMMALIN JAQUESS is a 67 y.o. Caucasian female whose past medical history and cardiac risk factors include:  resistant hypertension, mixed hyperlipidemia, nicotine dependence, COPD, known distal aorta stenosis (s/p successful Abdominal arteriogram and selective left renal arteriogram and lithotripsy balloon angioplasty of the suprarenal abdominal aorta using shockwave M5 catheter 02/03/2022), chronic HFpEF, hx of hypertensive emergency.   Impression: Hypertensive emergency on admission. Resistant hypertension at baseline Acute on chronic HFpEF, stage C, NYHA class III. Aortic stenosis,  moderate Acute COPD exacerbation. Recent COVID-19 infection. Cigarette smoking. Hyperlipidemia. Abdominal aortic stenosis status post angioplasty Renal artery stenosis Left subclavian and common carotid artery stenosis per imaging Moderate/severe celiac artery stenosis per imaging Mediastinal lymphadenopathy  Recommendations: Hypertensive emergency on admission: Resistant hypertension and baseline. Renal artery stenosis per imaging Continued home dose of labetalol Increase hydralazine to 100 mg p.o. 3 times daily Recommend weaning off nitro drip as she is having headaches.  Will hold off on oral nitrates as well. Transition her from lisinopril to losartan 50 mg p.o. daily. See HFpEF management as discussed below  Acute on chronic HFpEF, stage C, NYHA class III Shortness  of breath improving. Transition lisinopril to losartan for now.  Transition to [facility] based on renal function.  Can be done as outpatient Start Farxiga 10 mg p.o. daily Recommend starting spironolactone 25 mg p.o. daily -as of July/20 01/2023. Continue hydralazine and labetalol as discussed above Net IO Since Admission: -602.21 mL [05/29/23 1805]  Moderate aortic stenosis: Needs longitudinal follow-up. Blood pressure management for now.  Acute COPD exacerbation: Recent COVID-19 infection. Cigarette smoking. COPD and post-COVID management per primary team. Reemphasized importance of complete smoking cessation in the setting of underlying COPD and extensive vascular disease. 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 Given the degree of vascular disease we will recheck fasting lipids, direct LDL, LP(a)  Abdominal aortic stenosis status post angioplasty Renal artery stenosis Left subclavian and common carotid artery stenosis per imaging Moderate/severe celiac artery stenosis per imaging After acute  hospitalization recommend reestablishing care with Dr. Jacinto Halim to discuss long-term management. Clinically she has not lost any significant weight noticed she have postprandial symptoms.  She also does not complain of pain with overhead activities.  Blood pressure management as discussed above We will start her on aspirin 81 mg p.o. daily given the degree of atherosclerotic burden. Lipid management as discussed above  Mediastinal lymphadenopathy: Defer to primary team.  Noted on CTA of the chest abdomen pelvis.  Would recommend further workup given her prolonged history of smoking.   Patient's questions and concerns were addressed to her satisfaction. She voices understanding of the instructions provided during this encounter.   This note was created using a voice recognition software as a result there may be grammatical errors inadvertently enclosed that do not reflect the nature of this encounter. Every attempt is made to correct such errors.  Delilah Shan [facility]  Pager:  [phone] Office: [phone] 05/29/2023, 5:58 PM

## 2023-05-29 NOTE — Progress Notes (Signed)
Heart Failure Navigator Progress Note  Assessed for Heart & Vascular TOC clinic readiness.  Patient does not meet criteria due to [facility] patient.   Navigator will sign off at this time.    Dawn Fields, BSN, RN Heart Failure Nurse Navigator Secure Chat Only   

## 2023-05-29 NOTE — Progress Notes (Signed)
PROGRESS NOTE  Gwendolyn [name] WUJ:[id number] DOB: 23-Jul-1956 DOA: 05/28/2023 PCP: Ralene Ok, MD  HPI/Recap of past 24 hours: Gwendolyn [name] is a 67 y.o. female with medical history significant of HTN, COPD, hypothyroidism, suprarenal abdominal aortic stenosis,  anemia, and tobacco abuse who presents with complaints of progressively worsening shortness of breath, orthopnea, abdominal swelling X couple of days. Patient tested positive for COVID-19 about 3 weeks ago, didn't take paxlovid but completed azithromycin and took Augmentin X 5 days. Patient does report still smoking a pack cigarettes per day on average. In the ED, patient was noted to be afebrile with tachypnea, blood pressures elevated up to 213/75, and O2 saturation currently maintained on room air.  Labs significant for BNP 1292.5, high-sensitivity troponin 16, hemoglobin 10, sodium 131. Chest x-ray noted cardiomegaly with small bilateral pleural effusions and mild interstitial edema. Cardiology consulted. Patient admitted for further management.    Today, pt denies any worsening SOB, chest pain. Reports some headache likely 2/2 NTG drip and nausea. Son at bedside, discussed plan of care.    Assessment/Plan: Principal Problem:   Heart failure with mildly reduced ejection fraction (HFmrEF) (HCC) Active Problems:   Hypertensive urgency   History of COVID-19   Hyponatremia   COPD (chronic obstructive pulmonary disease) (HCC)   Abdominal aortic stenosis   Hypothyroidism   Normocytic anemia   Hyperlipidemia   Shortness of breath   Hypertensive emergency   Shortness of breath with exposure to COVID-19 virus   Bilateral carotid bruits   Acute exacerbation of chronic obstructive pulmonary disease (COPD) (HCC)   Acute heart failure with preserved ejection fraction (HFpEF) (HCC)   Acute on chronic diastolic HF BNP elevated at 1292.5 Chest x-ray concerning for cardiomegaly with small bilateral pleural effusions and  mild interstitial.  ECHO with EF 50-55%, no RWMA, grade 2 DDglobal hypokinesis Continue IV lasix Cardiology consulted, appreciate recs Strict I&Os and daily weights  Hyponatremia Possibly due to fluid overload Continue to monitor sodium levels with diuresis  Hypokalemia Replace as needed   Hypertensive urgency Initial blood pressure was elevated up to 213/75 Continue nitroglycerin drip and wean off Continue amlodipine, labetalol, and hydralazine Hydralazine IV as needed   Possible COPD exacerbation Active smoker with recent covid-19 PNA Procalcitonin negative, RVP negative Start solumedrol, duoneb, nebs   History of COVID-19 Home test kit positive for COVID 19 approximately 3 weeks ago Treated with empiric antibiotics of azithromycin and subsequently Augmentin Monitor  Abdominal aortic stenosis History of suprarenal abdominal aortic stenosis, high-grade, 90-99%, treated with shockwave lithotripsy, balloon angioplasty 02/03/2022 Not on any antiplatelet therapy. Continue Crestor   Hypothyroidism Continue levothyroxine   Normocytic anemia Hemoglobin around baseline No reports of bleeding Daily CBC   Hyperlipidemia Continue Crestor   Tobacco abuse Patient reports still smoking about a pack cigarettes per day on average  Continue to counsel on need of cessation of tobacco use  Nicotine patch offered      Estimated body mass index is 29.28 kg/m as calculated from the following:   Height as of this encounter: 5\' 1"  (1.549 m).   Weight as of this encounter: 70.3 kg.     Code Status: Full  Family Communication: Son at bedside  Disposition Plan: Status is: Inpatient Remains inpatient appropriate because: level of care      Consultants: Cardiology  Procedures: None  Antimicrobials: None  DVT prophylaxis:  Lovenox   Objective: Vitals:   05/29/23 0747 05/29/23 1200 05/29/23 1215 05/29/23 1226  BP: (!) 189/59 Marland Kitchen)  211/61 (!) 177/65   Pulse: 83 89  92 92  Resp: 17 (!) 26  18  Temp:  99 F (37.2 C)    TempSrc:  Oral    SpO2: (!) 89% 91%  93%  Weight:      Height:        Intake/Output Summary (Last 24 hours) at 05/29/2023 1428 Last data filed at 05/29/2023 1300 Gross per 24 hour  Intake 397.79 ml  Output 1000 ml  Net -602.21 ml   Filed Weights   05/28/23 1203 05/28/23 1847 05/29/23 0400  Weight: 67.1 kg 69.3 kg 70.3 kg    Exam: General: NAD  Cardiovascular: S1, S2 present Respiratory: CTAB Abdomen: Soft, nontender, mildly distended, bowel sounds present Musculoskeletal: No bilateral pedal edema noted Skin: Normal Psychiatry: Normal mood     Data Reviewed: CBC: Recent Labs  Lab 05/28/23 1240 05/29/23 0031  WBC 10.0 7.0  NEUTROABS 8.0*  --   HGB 10.0* 9.0*  HCT 30.9* 27.8*  MCV 87.5 86.1  PLT 251 247   Basic Metabolic Panel: Recent Labs  Lab 05/28/23 1240 05/29/23 0031  NA 131* 131*  K 3.5 3.3*  CL 102 99  CO2 22 24  GLUCOSE 111* 147*  BUN 12 13  CREATININE 0.78 0.95  CALCIUM 8.7* 8.9   GFR: Estimated Creatinine Clearance: 51.5 mL/min (by C-G formula based on SCr of 0.95 mg/dL). Liver Function Tests: Recent Labs  Lab 05/28/23 1240  AST 15  ALT 14  ALKPHOS 54  BILITOT 0.4  PROT 6.6  ALBUMIN 4.0   No results for input(s): "LIPASE", "AMYLASE" in the last 168 hours. No results for input(s): "AMMONIA" in the last 168 hours. Coagulation Profile: No results for input(s): "INR", "PROTIME" in the last 168 hours. Cardiac Enzymes: No results for input(s): "CKTOTAL", "CKMB", "CKMBINDEX", "TROPONINI" in the last 168 hours. BNP (last 3 results) No results for input(s): "PROBNP" in the last 8760 hours. HbA1C: No results for input(s): "HGBA1C" in the last 72 hours. CBG: No results for input(s): "GLUCAP" in the last 168 hours. Lipid Profile: No results for input(s): "CHOL", "HDL", "LDLCALC", "TRIG", "CHOLHDL", "LDLDIRECT" in the last 72 hours. Thyroid Function Tests: Recent Labs     05/28/23 1240  TSH 1.427   Anemia Panel: No results for input(s): "VITAMINB12", "FOLATE", "FERRITIN", "TIBC", "IRON", "RETICCTPCT" in the last 72 hours. Urine analysis:    Component Value Date/Time   COLORURINE YELLOW 02/07/2022 0048   APPEARANCEUR CLEAR 02/07/2022 0048   LABSPEC 1.012 02/07/2022 0048   PHURINE 5.0 02/07/2022 0048   GLUCOSEU NEGATIVE 02/07/2022 0048   HGBUR NEGATIVE 02/07/2022 0048   BILIRUBINUR NEGATIVE 02/07/2022 0048   KETONESUR NEGATIVE 02/07/2022 0048   PROTEINUR NEGATIVE 02/07/2022 0048   NITRITE NEGATIVE 02/07/2022 0048   LEUKOCYTESUR NEGATIVE 02/07/2022 0048   Sepsis Labs: @LABRCNTIP (procalcitonin:4,lacticidven:4)  ) Recent Results (from the past 240 hour(s))  Respiratory (~20 pathogens) panel by PCR     Status: None   Collection Time: 05/28/23  6:38 PM   Specimen: Nasopharyngeal Swab; Respiratory  Result Value Ref Range Status   Adenovirus NOT DETECTED NOT DETECTED Final   Coronavirus 229E NOT DETECTED NOT DETECTED Final    Comment: (NOTE) The Coronavirus on the Respiratory Panel, DOES NOT test for the novel  Coronavirus (2019 nCoV)    Coronavirus HKU1 NOT DETECTED NOT DETECTED Final   Coronavirus NL63 NOT DETECTED NOT DETECTED Final   Coronavirus OC43 NOT DETECTED NOT DETECTED Final   Metapneumovirus NOT DETECTED NOT DETECTED Final  Rhinovirus / Enterovirus NOT DETECTED NOT DETECTED Final   Influenza A NOT DETECTED NOT DETECTED Final   Influenza B NOT DETECTED NOT DETECTED Final   Parainfluenza Virus 1 NOT DETECTED NOT DETECTED Final   Parainfluenza Virus 2 NOT DETECTED NOT DETECTED Final   Parainfluenza Virus 3 NOT DETECTED NOT DETECTED Final   Parainfluenza Virus 4 NOT DETECTED NOT DETECTED Final   Respiratory Syncytial Virus NOT DETECTED NOT DETECTED Final   Bordetella pertussis NOT DETECTED NOT DETECTED Final   Bordetella Parapertussis NOT DETECTED NOT DETECTED Final   Chlamydophila pneumoniae NOT DETECTED NOT DETECTED Final    Mycoplasma pneumoniae NOT DETECTED NOT DETECTED Final    Comment: Performed at [facility], [address] [place], [place] [address]      Studies: VAS US CAROTID  Result Date: 05/29/2023 Carotid Arterial Duplex Study Patient Name:  Gwendolyn [name]  Date of Exam:   05/29/2023 Medical Rec #: [id number]           Accession #:    [id number] Date of Birth: [birth year]          Patient Gender: F Patient Age:   54 years Exam Location:  [facility] Procedure:      VAS US CAROTID Referring Phys: Tessa Lerner --------------------------------------------------------------------------------  Indications:       Bilateral bruits. Risk Factors:      Hypertension, hyperlipidemia, current smoker, PAD. Other Factors:     COPD, severe atherosclerotic calcification resulting in sever                    stenosis of the aorta and focal stenosis of the celiac and                    origin of the right renal artery. Comparison Study:  No prior study on file Performing Technologist: Sherren Kerns RVS  Examination Guidelines: A complete evaluation includes B-mode imaging, spectral Doppler, color Doppler, and power Doppler as needed of all accessible portions of each vessel. Bilateral testing is considered an integral part of a complete examination. Limited examinations for reoccurring indications may be performed as noted.  Right Carotid Findings: +----------+--------+--------+--------+------------------+--------+           PSV cm/sEDV cm/sStenosisPlaque DescriptionComments +----------+--------+--------+--------+------------------+--------+ CCA Prox  148     23              heterogenous               +----------+--------+--------+--------+------------------+--------+ CCA Distal133     20              heterogenous               +----------+--------+--------+--------+------------------+--------+ ICA Prox  192     31      1-39%   heterogenous                +----------+--------+--------+--------+------------------+--------+ ICA Mid   210     35                                         +----------+--------+--------+--------+------------------+--------+ ICA Distal118     29                                tortuous +----------+--------+--------+--------+------------------+--------+ ECA       190  6                                          +----------+--------+--------+--------+------------------+--------+ +----------+--------+-------+---------+-------------------+           PSV cm/sEDV cmsDescribe Arm Pressure (mmHG) +----------+--------+-------+---------+-------------------+ [id number]            Turbulent                    +----------+--------+-------+---------+-------------------+ +---------+--------+---+--------+--+--------+ VertebralPSV cm/s172EDV cm/s23Atypical +---------+--------+---+--------+--+--------+  Left Carotid Findings: +----------+--------+--------+--------+------------------+------------------+           PSV cm/sEDV cm/sStenosisPlaque DescriptionComments           +----------+--------+--------+--------+------------------+------------------+ CCA Prox  131     10                                intimal thickening +----------+--------+--------+--------+------------------+------------------+ CCA Distal107     17                                intimal thickening +----------+--------+--------+--------+------------------+------------------+ ICA Prox  192     26      1-39%   calcific                             +----------+--------+--------+--------+------------------+------------------+ ICA Mid   168     27                                                   +----------+--------+--------+--------+------------------+------------------+ ICA Distal78      16                                tortuous            +----------+--------+--------+--------+------------------+------------------+ ECA       360                                                          +----------+--------+--------+--------+------------------+------------------+ +----------+--------+--------+--------+-------------------+           PSV cm/sEDV cm/sDescribeArm Pressure (mmHG) +----------+--------+--------+--------+-------------------+ [id number]                                         +----------+--------+--------+--------+-------------------+ +---------+--------+---+--------+--+ VertebralPSV cm/s105EDV cm/s15 +---------+--------+---+--------+--+   Summary: Right Carotid: Velocities in the right ICA are consistent with a 1-39% stenosis. Left Carotid: Velocities in the left ICA are consistent with a 1-39% stenosis. Vertebrals:  Bilateral vertebral arteries demonstrate antegrade flow. Subclavians: Right subclavian artery flow was disturbed. Normal flow              hemodynamics were seen in the left subclavian artery. *See table(s) above for measurements and observations.  Electronically signed by Delia Heady MD on 05/29/2023 at 1:53:06 PM.    Final    ECHOCARDIOGRAM COMPLETE  Result  Date: 05/29/2023    ECHOCARDIOGRAM REPORT   Patient Name:   Gwendolyn [name] Date of Exam: 05/29/2023 Medical Rec #:  [id number]          Height:       61.0 in Accession #:    [id number]         Weight:       155.0 lb Date of Birth:  [birth year]         BSA:          1.695 m Patient Age:    66 years           BP:           159/72 mmHg Patient Gender: F                  HR:           73 bpm. Exam Location:  Inpatient Procedure: 2D Echo, Color Doppler and Cardiac Doppler Indications:    I50.31 Acute diastolic (congestive) heart failure  History:        Patient has prior history of Echocardiogram examinations, most                 recent 02/05/2022. CHF, COPD; Risk Factors:Hypertension and                 Dyslipidemia.  Sonographer:    Irving Burton Senior  RDCS Referring Phys: [phone] RONDELL A SMITH  Sonographer Comments: Suboptimal apical window due to COPD, wall motion obtained from short axis. IMPRESSIONS  1. Left ventricular ejection fraction, by estimation, is 50 to 55%. The left ventricle has low normal function. The left ventricle has no regional wall motion abnormalities. There is moderate left ventricular hypertrophy. Left ventricular diastolic parameters are consistent with Grade II diastolic dysfunction (pseudonormalization). Elevated left atrial pressure.  2. Right ventricular systolic function is normal. The right ventricular size is normal. Tricuspid regurgitation signal is inadequate for assessing PA pressure.  3. Left atrial size was severely dilated.  4. The mitral valve is normal in structure. No evidence of mitral valve regurgitation.  5. The aortic valve was not well visualized. Aortic valve regurgitation is mild. Moderate aortic valve stenosis. Vmax 3.0 m/s, MG , AVA 1.2 cm^2, DI 0.43  6. The inferior vena cava is dilated in size with <50% respiratory variability, suggesting right atrial pressure of 15 mmHg. FINDINGS  Left Ventricle: Left ventricular ejection fraction, by estimation, is 50 to 55%. The left ventricle has low normal function. The left ventricle has no regional wall motion abnormalities. The left ventricular internal cavity size was normal in size. There is moderate left ventricular hypertrophy. Left ventricular diastolic parameters are consistent with Grade II diastolic dysfunction (pseudonormalization). Elevated left atrial pressure. Right Ventricle: The right ventricular size is normal. No increase in right ventricular wall thickness. Right ventricular systolic function is normal. Tricuspid regurgitation signal is inadequate for assessing PA pressure. Left Atrium: Left atrial size was severely dilated. Right Atrium: Right atrial size was normal in size. Pericardium: Trivial pericardial effusion is present. Mitral Valve: The  mitral valve is normal in structure. No evidence of mitral valve regurgitation. Tricuspid Valve: The tricuspid valve is normal in structure. Tricuspid valve regurgitation is trivial. Aortic Valve: The aortic valve was not well visualized. Aortic valve regurgitation is mild. Aortic regurgitation PHT measures 350 msec. Moderate aortic stenosis is present. Aortic valve mean gradient measures 18.0 mmHg. Aortic valve peak gradient measures 36.0 mmHg. Aortic valve area, by VTI measures 1.18  cm. Pulmonic Valve: The pulmonic valve was not well visualized. Pulmonic valve regurgitation is trivial. Aorta: The aortic root and ascending aorta are structurally normal, with no evidence of dilitation. Venous: The inferior vena cava is dilated in size with less than 50% respiratory variability, suggesting right atrial pressure of 15 mmHg. IAS/Shunts: The interatrial septum was not well visualized.  LEFT VENTRICLE PLAX 2D LVIDd:         5.10 cm   Diastology LVIDs:         3.70 cm   LV e' medial:    5.44 cm/s LV PW:         1.50 cm   LV E/e' medial:  19.5 LV IVS:        1.20 cm   LV e' lateral:   4.24 cm/s LVOT diam:     1.90 cm   LV E/e' lateral: 25.0 LV SV:         72 LV SV Index:   43 LVOT Area:     2.84 cm  RIGHT VENTRICLE RV S prime:     13.50 cm/s TAPSE (M-mode): 2.4 cm LEFT ATRIUM             Index        RIGHT ATRIUM           Index LA diam:        4.40 cm 2.60 cm/m   RA Area:     16.90 cm LA Vol (A2C):   94.7 ml 55.88 ml/m  RA Volume:   41.20 ml  24.31 ml/m LA Vol (A4C):   84.7 ml 49.98 ml/m LA Biplane Vol: 92.8 ml 54.76 ml/m  AORTIC VALVE AV Area (Vmax):    1.22 cm AV Area (Vmean):   1.18 cm AV Area (VTI):     1.18 cm AV Vmax:           300.00 cm/s AV Vmean:          194.000 cm/s AV VTI:            0.612 m AV Peak Grad:      36.0 mmHg AV Mean Grad:      18.0 mmHg LVOT Vmax:         129.00 cm/s LVOT Vmean:        80.800 cm/s LVOT VTI:          0.255 m LVOT/AV VTI ratio: 0.42 AI PHT:            350 msec  AORTA Ao  Root diam: 2.90 cm Ao Asc diam:  3.30 cm MITRAL VALVE MV Area (PHT): 3.31 cm     SHUNTS MV Decel Time: 229 msec     Systemic VTI:  0.26 m MV E velocity: 106.00 cm/s  Systemic Diam: 1.90 cm MV A velocity: 94.40 cm/s MV E/A ratio:  1.12 Epifanio Lesches MD Electronically signed by Epifanio Lesches MD Signature Date/Time: 05/29/2023/1:16:29 PM    Final    CT Angio Chest/Abd/Pel for Dissection W and/or W/WO  Result Date: 05/28/2023 CLINICAL DATA:  Acute aortic syndrome. EXAM: CT ANGIOGRAPHY CHEST, ABDOMEN AND PELVIS TECHNIQUE: Non-contrast CT of the chest was initially obtained. Multidetector CT imaging through the chest, abdomen and pelvis was performed using the standard protocol during bolus administration of intravenous contrast. Multiplanar reconstructed images and MIPs were obtained and reviewed to evaluate the vascular anatomy. RADIATION DOSE REDUCTION: This exam was performed according to the departmental dose-optimization program which includes automated exposure control, adjustment of  the mA and/or kV according to patient size and/or use of iterative reconstruction technique. CONTRAST:  OMNIPAQUE IOHEXOL 350 MG/ML SOLN COMPARISON:  CT abdomen and pelvis 08/27/2021 FINDINGS: CTA CHEST FINDINGS Cardiovascular: Preferential opacification of the thoracic aorta. No evidence of thoracic aortic aneurysm or dissection. Heart is mildly enlarged. There are moderate calcified atherosclerotic plaque throughout the aorta. Moderate severe focal stenosis identified of the origins of the left subclavian and left common carotid artery secondary to calcified atherosclerotic disease. No pericardial effusion. Mediastinum/Nodes: There are enlarged paratracheal lymph nodes measuring up to 12 mm short axis. There are enlarged prevascular lymph nodes measuring up to 10 mm short axis. Enlarged subcarinal lymph node measures 15 mm short axis. Esophagus is within normal limits. The thyroid gland is not well seen.  Lungs/Pleura: There are small bilateral pleural effusions with compressive atelectasis in the bilateral lower lobes. There is also small amount of atelectasis in the lingula. No pneumothorax. Trachea and central airways are patent. Musculoskeletal: No chest wall abnormality. No acute or significant osseous findings. Review of the MIP images confirms the above findings. CTA ABDOMEN AND PELVIS FINDINGS VASCULAR Aorta: Severe atherosclerotic calcifications are again seen. This results in severe stenosis of the aorta in the proximal abdominal aorta at the level of the celiac axis, superior mesenteric arteries and renal arteries. Infrarenal abdominal aorta is nondilated. There is no evidence for dissection. Celiac: There is moderate severe focal stenosis of the origin of the celiac artery. The celiac artery and branches are otherwise within normal limits. SMA: Patent without evidence of aneurysm, dissection, vasculitis or significant stenosis. Renals: Moderate severe focal stenosis of the origin of the right renal artery. Left renal artery within normal limits. Both arteries are patent. No aneurysm or thrombosis. IMA: Patent without evidence of aneurysm, dissection, vasculitis or significant stenosis. Inflow: Patent without evidence of aneurysm, dissection, vasculitis or significant stenosis. Mild calcified atherosclerotic disease present. Veins: No obvious venous abnormality within the limitations of this arterial phase study. Review of the MIP images confirms the above findings. NON-VASCULAR Hepatobiliary: No focal liver abnormality is seen. Status post cholecystectomy. No biliary dilatation. Pancreas: Unremarkable. No pancreatic ductal dilatation or surrounding inflammatory changes. Spleen: Normal in size without focal abnormality. Adrenals/Urinary Tract: Bilateral renal calculi are present including right inferior pole renal calyceal calculus measuring 13 x 10 by 6 mm. There is no hydronephrosis or perinephric fat  stranding. The adrenal glands are within normal limits. The bladder is within normal limits. Stomach/Bowel: Stomach is within normal limits. Appendix is not seen. No evidence of bowel wall thickening, distention, or inflammatory changes. Lymphatic: There are nonenlarged upper abdominal and retroperitoneal lymph nodes similar to prior. Reproductive: Status post hysterectomy. No adnexal masses. Other: There is trace free fluid in the pelvis. Musculoskeletal: No fracture is seen. Review of the MIP images confirms the above findings. IMPRESSION: 1. No evidence for aortic dissection or aneurysm. 2. Stable severe atherosclerotic disease of the abdominal aorta resulting in severe stenosis of the proximal abdominal aorta at the level of the celiac axis, superior mesenteric artery and renal arteries. 3. Moderate severe focal stenosis of the origins of the left subclavian and left common carotid arteries. 4. Moderate severe focal stenosis of the origin of the celiac artery. 5. Moderate severe focal stenosis of the origin of the right renal artery. 6. Small bilateral pleural effusions with compressive atelectasis. 7. Mediastinal lymphadenopathy of uncertain etiology. 8. Trace free fluid in the pelvis. 9. Nonobstructing bilateral renal calculi. Electronically Signed   By: Linton Rump  Malena Edman M.D.   On: 05/28/2023 22:39    Scheduled Meds:  amLODipine  10 mg Oral Daily   arformoterol  15 mcg Nebulization BID   budesonide (PULMICORT) nebulizer solution  0.25 mg Nebulization BID   enoxaparin (LOVENOX) injection  40 mg Subcutaneous Q24H   furosemide  40 mg Intravenous BID   hydrALAZINE  100 mg Oral TID   ipratropium-albuterol  3 mL Nebulization BID   labetalol  400 mg Oral BID   levothyroxine  100 mcg Oral Q0600   losartan  50 mg Oral Daily   methylPREDNISolone (SOLU-MEDROL) injection  80 mg Intravenous Daily   nicotine  21 mg Transdermal Daily   pantoprazole  40 mg Oral Daily   potassium chloride  40 mEq Oral BID    rosuvastatin  40 mg Oral Daily   sodium chloride flush  3 mL Intravenous Q12H    Continuous Infusions:  nitroGLYCERIN 70 mcg/min (05/29/23 1045)     LOS: 1 day     Briant Cedar, MD [facility]  If 7PM-7AM, please contact night-coverage [email or web address] 05/29/2023, 2:28 PM

## 2023-05-30 DIAGNOSIS — I5022 Chronic systolic (congestive) heart failure: Secondary | ICD-10-CM | POA: Diagnosis not present

## 2023-05-30 LAB — CBC WITH DIFFERENTIAL/PLATELET
Abs Immature Granulocytes: 0.08 10*3/uL — ABNORMAL HIGH (ref 0.00–0.07)
Basophils Absolute: 0 10*3/uL (ref 0.0–0.1)
Basophils Relative: 0 %
Eosinophils Absolute: 0 10*3/uL (ref 0.0–0.5)
Eosinophils Relative: 0 %
HCT: 22.2 % — ABNORMAL LOW (ref 36.0–46.0)
Hemoglobin: 7.1 g/dL — ABNORMAL LOW (ref 12.0–15.0)
Immature Granulocytes: 1 %
Lymphocytes Relative: 7 %
Lymphs Abs: 0.7 10*3/uL (ref 0.7–4.0)
MCH: 27.7 pg (ref 26.0–34.0)
MCHC: 32 g/dL (ref 30.0–36.0)
MCV: 86.7 fL (ref 80.0–100.0)
Monocytes Absolute: 0.6 10*3/uL (ref 0.1–1.0)
Monocytes Relative: 6 %
Neutro Abs: 8.8 10*3/uL — ABNORMAL HIGH (ref 1.7–7.7)
Neutrophils Relative %: 86 %
Platelets: 217 10*3/uL (ref 150–400)
RBC: 2.56 MIL/uL — ABNORMAL LOW (ref 3.87–5.11)
RDW: 14 % (ref 11.5–15.5)
WBC: 10.3 10*3/uL (ref 4.0–10.5)
nRBC: 0.2 % (ref 0.0–0.2)

## 2023-05-30 LAB — CBC
HCT: 22.3 % — ABNORMAL LOW (ref 36.0–46.0)
Hemoglobin: 7.1 g/dL — ABNORMAL LOW (ref 12.0–15.0)
MCH: 27.6 pg (ref 26.0–34.0)
MCHC: 31.8 g/dL (ref 30.0–36.0)
MCV: 86.8 fL (ref 80.0–100.0)
Platelets: 204 10*3/uL (ref 150–400)
RBC: 2.57 MIL/uL — ABNORMAL LOW (ref 3.87–5.11)
RDW: 13.8 % (ref 11.5–15.5)
WBC: 12.5 10*3/uL — ABNORMAL HIGH (ref 4.0–10.5)
nRBC: 0 % (ref 0.0–0.2)

## 2023-05-30 LAB — BASIC METABOLIC PANEL
Anion gap: 12 (ref 5–15)
Anion gap: 10 (ref 5–15)
BUN: 28 mg/dL — ABNORMAL HIGH (ref 8–23)
BUN: 34 mg/dL — ABNORMAL HIGH (ref 8–23)
CO2: 23 mmol/L (ref 22–32)
CO2: 23 mmol/L (ref 22–32)
Calcium: 8.9 mg/dL (ref 8.9–10.3)
Calcium: 8.8 mg/dL — ABNORMAL LOW (ref 8.9–10.3)
Chloride: 96 mmol/L — ABNORMAL LOW (ref 98–111)
Chloride: 95 mmol/L — ABNORMAL LOW (ref 98–111)
Creatinine, Ser: 1.93 mg/dL — ABNORMAL HIGH (ref 0.44–1.00)
Creatinine, Ser: 2.17 mg/dL — ABNORMAL HIGH (ref 0.44–1.00)
GFR, Estimated: 28 mL/min — ABNORMAL LOW (ref >60)
GFR, Estimated: 24 mL/min — ABNORMAL LOW (ref >60)
Glucose, Bld: 132 mg/dL — ABNORMAL HIGH (ref 70–99)
Glucose, Bld: 113 mg/dL — ABNORMAL HIGH (ref 70–99)
Potassium: 3.7 mmol/L (ref 3.5–5.1)
Potassium: 3.7 mmol/L (ref 3.5–5.1)
Sodium: 131 mmol/L — ABNORMAL LOW (ref 135–145)
Sodium: 128 mmol/L — ABNORMAL LOW (ref 135–145)

## 2023-05-30 LAB — TYPE AND SCREEN
ABO/RH(D): O POS
Unit division: 0

## 2023-05-30 LAB — IRON AND TIBC
Iron: 17 ug/dL — ABNORMAL LOW (ref 28–170)
Saturation Ratios: 4 % — ABNORMAL LOW (ref 10.4–31.8)
TIBC: 382 ug/dL (ref 250–450)
UIBC: 365 ug/dL

## 2023-05-30 LAB — LDL CHOLESTEROL, DIRECT: Direct LDL: 118 mg/dL — ABNORMAL HIGH (ref 0–99)

## 2023-05-30 LAB — HEMOGLOBIN AND HEMATOCRIT, BLOOD
HCT: 33.3 % — ABNORMAL LOW (ref 36.0–46.0)
Hemoglobin: 11.2 g/dL — ABNORMAL LOW (ref 12.0–15.0)

## 2023-05-30 LAB — LIPID PANEL
Cholesterol: 165 mg/dL (ref 0–200)
HDL: 27 mg/dL — ABNORMAL LOW (ref >40)
LDL Cholesterol: 122 mg/dL — ABNORMAL HIGH (ref 0–99)
Total CHOL/HDL Ratio: 6.1 RATIO
Triglycerides: 82 mg/dL (ref <150)
VLDL: 16 mg/dL (ref 0–40)

## 2023-05-30 LAB — PREPARE RBC (CROSSMATCH)

## 2023-05-30 LAB — BPAM RBC: ISSUE DATE / TIME: 202407231624

## 2023-05-30 LAB — FOLATE: Folate: 8.6 ng/mL (ref >5.9)

## 2023-05-30 LAB — VITAMIN B12: Vitamin B-12: 196 pg/mL (ref 180–914)

## 2023-05-30 LAB — BRAIN NATRIURETIC PEPTIDE: B Natriuretic Peptide: 1252.8 pg/mL — ABNORMAL HIGH (ref 0.0–100.0)

## 2023-05-30 LAB — FERRITIN: Ferritin: 10 ng/mL — ABNORMAL LOW (ref 11–307)

## 2023-05-30 MED ORDER — FUROSEMIDE 10 MG/ML IJ SOLN
40.0000 mg | Freq: Once | INTRAMUSCULAR | Status: AC
  Administered 2023-05-30: 40 mg via INTRAVENOUS
  Filled 2023-05-30: qty 4

## 2023-05-30 MED ORDER — CYANOCOBALAMIN 1000 MCG/ML IJ SOLN
1000.0000 ug | Freq: Every day | INTRAMUSCULAR | Status: AC
  Administered 2023-05-30 – 2023-06-03 (×5): 1000 ug via SUBCUTANEOUS
  Filled 2023-05-30 (×5): qty 1

## 2023-05-30 MED ORDER — POLYETHYLENE GLYCOL 3350 17 G PO PACK
17.0000 g | PACK | Freq: Every day | ORAL | Status: DC
  Administered 2023-05-30 – 2023-06-02 (×4): 17 g via ORAL
  Filled 2023-05-30 (×6): qty 1

## 2023-05-30 MED ORDER — SODIUM CHLORIDE 0.9% IV SOLUTION: Freq: Once | INTRAVENOUS | Status: AC

## 2023-05-30 MED ORDER — SENNOSIDES-DOCUSATE SODIUM 8.6-50 MG PO TABS
1.0000 | ORAL_TABLET | Freq: Every day | ORAL | Status: DC
  Administered 2023-05-30 – 2023-06-03 (×4): 1 via ORAL
  Filled 2023-05-30 (×5): qty 1

## 2023-05-30 MED ORDER — EZETIMIBE 10 MG PO TABS
10.0000 mg | ORAL_TABLET | Freq: Every day | ORAL | Status: DC
  Administered 2023-05-30 – 2023-06-04 (×6): 10 mg via ORAL
  Filled 2023-05-30 (×6): qty 1

## 2023-05-30 MED ORDER — ISOSORBIDE MONONITRATE ER 60 MG PO TB24
60.0000 mg | ORAL_TABLET | Freq: Every day | ORAL | Status: DC
  Administered 2023-05-30 – 2023-05-31 (×2): 60 mg via ORAL
  Filled 2023-05-30 (×2): qty 1

## 2023-05-30 MED ORDER — PANTOPRAZOLE SODIUM 40 MG IV SOLR
40.0000 mg | Freq: Two times a day (BID) | INTRAVENOUS | Status: DC
  Administered 2023-05-30 – 2023-05-31 (×2): 40 mg via INTRAVENOUS
  Filled 2023-05-30 (×2): qty 10

## 2023-05-30 NOTE — Progress Notes (Signed)
  X-cover Note: Called about pt's being more SOB today. Has had 2 units of PRBC without any lasix. Will give 40 mg IV lasix x 1   Carollee Herter, DO [facility]

## 2023-05-30 NOTE — Progress Notes (Signed)
PROGRESS NOTE  Gwendolyn [name] UEA:[id number] DOB: [birth year] DOA: 05/28/2023 PCP: Ralene Ok, MD  HPI/Recap of past 24 hours: Gwendolyn [name] is a 67 y.o. female with medical history significant of HTN, COPD, hypothyroidism, suprarenal abdominal aortic stenosis,  anemia, and tobacco abuse who presents with complaints of progressively worsening shortness of breath, orthopnea, abdominal swelling X couple of days. Patient tested positive for COVID-19 about 3 weeks ago, didn't take paxlovid but completed azithromycin and took Augmentin X 5 days. Patient does report still smoking a pack cigarettes per day on average. In the ED, patient was noted to be afebrile with tachypnea, blood pressures elevated up to 213/75, and O2 saturation currently maintained on room air.  Labs significant for BNP 1292.5, high-sensitivity troponin 16, hemoglobin 10, sodium 131. Chest x-ray noted cardiomegaly with small bilateral pleural effusions and mild interstitial edema. Cardiology consulted. Patient admitted for further management.    Today, pt denies any chest pain, or worsening SOB. Noted drop in hemoglobin, denies any melena or BRBPR. FOBT pending. GI consulted. Transfuse 2U of PRBC.    Assessment/Plan: Principal Problem:   Heart failure with mildly reduced ejection fraction (HFmrEF) (HCC) Active Problems:   Hypertensive urgency   History of COVID-19   Hyponatremia   COPD (chronic obstructive pulmonary disease) (HCC)   Abdominal aortic stenosis   Hypothyroidism   Normocytic anemia   Hyperlipidemia   Shortness of breath   Hypertensive emergency   Shortness of breath with exposure to COVID-19 virus   Bilateral carotid bruits   Acute exacerbation of chronic obstructive pulmonary disease (COPD) (HCC)   Acute heart failure with preserved ejection fraction (HFpEF) (HCC)   Resistant hypertension   Renal artery stenosis (HCC)   Aortic valve stenosis   Cigarette smoker   Acute on chronic diastolic  HF BNP elevated at 1292.5 Chest x-ray concerning for cardiomegaly with small bilateral pleural effusions and mild interstitial.  ECHO with EF 50-55%, no RWMA, grade 2 DD global hypokinesis Held IV lasix Cardiology consulted, appreciate recs Strict I&Os and daily weights  AKI Cr jumped to 1.93 Held lasix, losartan Daily BMP  Hyponatremia Daily BMP  Hypokalemia Replace as needed   Hypertensive urgency Bilateral artery stenosis Initial blood pressure was elevated up to 213/75 on admission S/p nitroglycerin drip, weaned off Continue amlodipine, labetalol, hydralazine, added imdur Hold home losartan Hydralazine IV as needed   Possible COPD exacerbation Active smoker with recent covid-19 PNA Procalcitonin negative, RVP negative Continue solumedrol, duoneb, nebs  Possible GIB Iron def anemia Hgb down to 7.1, baseline around 10 Last EGD on 10/2022 with erosive gastropathy, colonoscopy on 10/2022 with polyps Denies any melena or BRBPR FOBT pending Anemia panel with iron 17, sats 4%, ferritin 10 Transfuse 2U of PRBC on 7/23 Patient may benefit from IV iron after blood transfusion and may continue PO thereafter GI Dr Elnoria Howard consulted on 7/23 Held ASA, lovenox Monitor CBC   History of COVID-19 Home test kit positive for COVID 19 approximately 3 weeks ago Treated with empiric antibiotics of azithromycin and subsequently Augmentin Monitor  Abdominal aortic stenosis History of suprarenal abdominal aortic stenosis, high-grade, 90-99%, treated with shockwave lithotripsy, balloon angioplasty 02/03/2022 Not on any antiplatelet therapy Continue Crestor   Hypothyroidism Continue levothyroxine   Hyperlipidemia Continue Crestor   Tobacco abuse Patient reports still smoking about a pack cigarettes per day on average  Continue to counsel on need of cessation of tobacco use  Nicotine patch offered      Estimated body mass index  is 28.55 kg/m as calculated from the following:    Height as of this encounter: 5\' 1"  (1.549 m).   Weight as of this encounter: 68.5 kg.     Code Status: Full  Family Communication: None at bedside  Disposition Plan: Status is: Inpatient Remains inpatient appropriate because: level of care      Consultants: Cardiology GI  Procedures: None  Antimicrobials: None  DVT prophylaxis:  Lovenox   Objective: Vitals:   05/30/23 0839 05/30/23 1220 05/30/23 1244 05/30/23 1245  BP:  (!) 132/42 (!) 150/63 (!) 150/63  Pulse:  73 76 76  Resp:  16 18 18   Temp:  98.9 F (37.2 C) 98.9 F (37.2 C) 98.9 F (37.2 C)  TempSrc:  Oral  Oral  SpO2: 99% 97%  95%  Weight:      Height:        Intake/Output Summary (Last 24 hours) at 05/30/2023 1313 Last data filed at 05/30/2023 1229 Gross per 24 hour  Intake 455 ml  Output 450 ml  Net 5 ml   Filed Weights   05/28/23 1847 05/29/23 0400 05/30/23 0514  Weight: 69.3 kg 70.3 kg 68.5 kg    Exam: General: NAD  Cardiovascular: S1, S2 present Respiratory: CTAB Abdomen: Soft, nontender, mildly distended, bowel sounds present Musculoskeletal: No bilateral pedal edema noted Skin: Normal Psychiatry: Normal mood     Data Reviewed: CBC: Recent Labs  Lab 05/28/23 1240 05/29/23 0031 05/30/23 0134 05/30/23 0815  WBC 10.0 7.0 10.3 12.5*  NEUTROABS 8.0*  --  8.8*  --   HGB 10.0* 9.0* 7.1* 7.1*  HCT 30.9* 27.8* 22.2* 22.3*  MCV 87.5 86.1 86.7 86.8  PLT 251 247 217 204   Basic Metabolic Panel: Recent Labs  Lab 05/28/23 1240 05/29/23 0031 05/30/23 0134 05/30/23 0815  NA 131* 131* 131* 128*  K 3.5 3.3* 3.7 3.7  CL 102 99 96* 95*  CO2 22 24 23 23   GLUCOSE 111* 147* 132* 113*  BUN 12 13 28* 34*  CREATININE 0.78 0.95 1.93* 2.17*  CALCIUM 8.7* 8.9 8.9 8.8*   GFR: Estimated Creatinine Clearance: 22.3 mL/min (A) (by C-G formula based on SCr of 2.17 mg/dL (H)). Liver Function Tests: Recent Labs  Lab 05/28/23 1240  AST 15  ALT 14  ALKPHOS 54  BILITOT 0.4  PROT 6.6   ALBUMIN 4.0   No results for input(s): "LIPASE", "AMYLASE" in the last 168 hours. No results for input(s): "AMMONIA" in the last 168 hours. Coagulation Profile: No results for input(s): "INR", "PROTIME" in the last 168 hours. Cardiac Enzymes: No results for input(s): "CKTOTAL", "CKMB", "CKMBINDEX", "TROPONINI" in the last 168 hours. BNP (last 3 results) No results for input(s): "PROBNP" in the last 8760 hours. HbA1C: No results for input(s): "HGBA1C" in the last 72 hours. CBG: No results for input(s): "GLUCAP" in the last 168 hours. Lipid Profile: Recent Labs    05/30/23 0134  CHOL 165  HDL 27*  LDLCALC 122*  TRIG 82  CHOLHDL 6.1  LDLDIRECT 118*   Thyroid Function Tests: Recent Labs    05/28/23 1240  TSH 1.427   Anemia Panel: Recent Labs    05/30/23 0815  VITAMINB12 196  FOLATE 8.6  FERRITIN 10*  TIBC 382  IRON 17*   Urine analysis:    Component Value Date/Time   COLORURINE YELLOW 02/07/2022 0048   APPEARANCEUR CLEAR 02/07/2022 0048   LABSPEC 1.012 02/07/2022 0048   PHURINE 5.0 02/07/2022 0048   GLUCOSEU NEGATIVE 02/07/2022 0048  HGBUR NEGATIVE 02/07/2022 0048   BILIRUBINUR NEGATIVE 02/07/2022 0048   KETONESUR NEGATIVE 02/07/2022 0048   PROTEINUR NEGATIVE 02/07/2022 0048   NITRITE NEGATIVE 02/07/2022 0048   LEUKOCYTESUR NEGATIVE 02/07/2022 0048   Sepsis Labs: @LABRCNTIP (procalcitonin:4,lacticidven:4)  ) Recent Results (from the past 240 hour(s))  Respiratory (~20 pathogens) panel by PCR     Status: None   Collection Time: 05/28/23  6:38 PM   Specimen: Nasopharyngeal Swab; Respiratory  Result Value Ref Range Status   Adenovirus NOT DETECTED NOT DETECTED Final   Coronavirus 229E NOT DETECTED NOT DETECTED Final    Comment: (NOTE) The Coronavirus on the Respiratory Panel, DOES NOT test for the novel  Coronavirus (2019 nCoV)    Coronavirus HKU1 NOT DETECTED NOT DETECTED Final   Coronavirus NL63 NOT DETECTED NOT DETECTED Final   Coronavirus OC43  NOT DETECTED NOT DETECTED Final   Metapneumovirus NOT DETECTED NOT DETECTED Final   Rhinovirus / Enterovirus NOT DETECTED NOT DETECTED Final   Influenza A NOT DETECTED NOT DETECTED Final   Influenza B NOT DETECTED NOT DETECTED Final   Parainfluenza Virus 1 NOT DETECTED NOT DETECTED Final   Parainfluenza Virus 2 NOT DETECTED NOT DETECTED Final   Parainfluenza Virus 3 NOT DETECTED NOT DETECTED Final   Parainfluenza Virus 4 NOT DETECTED NOT DETECTED Final   Respiratory Syncytial Virus NOT DETECTED NOT DETECTED Final   Bordetella pertussis NOT DETECTED NOT DETECTED Final   Bordetella Parapertussis NOT DETECTED NOT DETECTED Final   Chlamydophila pneumoniae NOT DETECTED NOT DETECTED Final   Mycoplasma pneumoniae NOT DETECTED NOT DETECTED Final    Comment: Performed at [facility], [address], [place], [place] [address]      Studies: No results found.  Scheduled Meds:  amLODipine  10 mg Oral Daily   arformoterol  15 mcg Nebulization BID   budesonide (PULMICORT) nebulizer solution  0.25 mg Nebulization BID   cyanocobalamin  1,000 mcg Subcutaneous Q2000   dapagliflozin propanediol  10 mg Oral Daily   ezetimibe  10 mg Oral Daily   hydrALAZINE  100 mg Oral TID   ipratropium-albuterol  3 mL Nebulization BID   isosorbide mononitrate  60 mg Oral Daily   labetalol  400 mg Oral BID   levothyroxine  100 mcg Oral Q0600   methylPREDNISolone (SOLU-MEDROL) injection  80 mg Intravenous Daily   nicotine  21 mg Transdermal Daily   pantoprazole (PROTONIX) IV  40 mg Intravenous Q12H   rosuvastatin  40 mg Oral Daily   sodium chloride flush  3 mL Intravenous Q12H    Continuous Infusions:  nitroGLYCERIN Stopped (05/30/23 1222)     LOS: 2 days     Briant Cedar, MD [facility]  If 7PM-7AM, please contact night-coverage [email or web address] 05/30/2023, 1:13 PM

## 2023-05-30 NOTE — Progress Notes (Signed)
Progress Note  Patient Name: Gwendolyn [name] MRN: [id number] DOB: 1956-10-28 Date of Encounter: 05/30/2023  Attending physician: Briant Cedar, MD Primary care provider: Ralene Ok, MD Primary Cardiologist: Dr. Jacinto Halim  Subjective: Gwendolyn [name] is a 67 y.o. Caucasian female who was seen and examined at bedside  Shortness of breath is much better  Still on Esto oxygen and nitroglycerin drip  Case discussed and reviewed with her nurse.  Objective: Vital Signs in the last 24 hours: Temp:  [98.7 F (37.1 C)-99.3 F (37.4 C)] 98.7 F (37.1 C) (07/23 0800) Pulse Rate:  [72-95] 72 (07/23 0800) Resp:  [15-34] 15 (07/23 0800) BP: (155-211)/(52-77) 171/52 (07/23 0800) SpO2:  [91 %-99 %] 99 % (07/23 0839) FiO2 (%):  [28 %] 28 % (07/23 0839) Weight:  [68.5 kg] 68.5 kg (07/23 0514)  Intake/Output:  Intake/Output Summary (Last 24 hours) at 05/30/2023 1033 Last data filed at 05/30/2023 0517 Gross per 24 hour  Intake 240 ml  Output 450 ml  Net -210 ml    Net IO Since Admission: -932.21 mL [05/30/23 1033]  Weights:     05/30/2023    5:14 AM 05/29/2023    4:00 AM 05/28/2023    6:47 PM  Last 3 Weights  Weight (lbs) 151 lb 1.6 oz 154 lb 15.7 oz 152 lb 12.5 oz  Weight (kg) 68.539 kg 70.3 kg 69.3 kg      Telemetry:  Overnight telemetry shows sinus, which I personally reviewed.   Physical examination: PHYSICAL EXAM: Vitals:   05/30/23 0514 05/30/23 0748 05/30/23 0800 05/30/23 0839  BP: (!) 155/77 (!) 173/55 (!) 171/52   Pulse: 81 74 72   Resp: 19 16 15    Temp: 99 F (37.2 C)  98.7 F (37.1 C)   TempSrc: Oral  Oral   SpO2: 95% 98% 98% 99%  Weight: 68.5 kg     Height:        Physical Exam  Constitutional: No distress.  Age appropriate, hemodynamically stable.   Neck: No JVD present.  Cardiovascular: Normal rate, regular rhythm, S1 normal, S2 normal, intact distal pulses and normal pulses. Exam reveals no gallop, no S3 and no S4.  Murmur heard. Midsystolic  murmur is present with a grade of 3/6 at the upper right sternal border radiating to the neck. Decrescendo diastolic murmur is present with a grade of 3/4 at the upper right sternal border radiating to the apex. Pulses:      Carotid pulses are  on the right side with bruit and  on the left side with bruit. Pulmonary/Chest: Effort normal. No stridor. She has no rales. She has scattered wheezes.  Abdominal: Soft. Bowel sounds are normal. She exhibits no distension. There is no abdominal tenderness.  Musculoskeletal:        General: No edema.     Cervical back: Neck supple.  Neurological: She is alert and oriented to person, place, and time. She has intact cranial nerves (2-12).  Skin: Skin is warm and moist.     Lab Results: Chemistry Recent Labs  Lab 05/28/23 1240 05/29/23 0031 05/30/23 0134 05/30/23 0815  NA 131* 131* 131* 128*  K 3.5 3.3* 3.7 3.7  CL 102 99 96* 95*  CO2 22 24 23 23   GLUCOSE 111* 147* 132* 113*  BUN 12 13 28* 34*  CREATININE 0.78 0.95 1.93* 2.17*  CALCIUM 8.7* 8.9 8.9 8.8*  PROT 6.6  --   --   --   ALBUMIN 4.0  --   --   --  AST 15  --   --   --   ALT 14  --   --   --   ALKPHOS 54  --   --   --   BILITOT 0.4  --   --   --   GFRNONAA >60 >60 28* 24*  ANIONGAP 7 8 12 10     Hematology Recent Labs  Lab 05/29/23 0031 05/30/23 0134 05/30/23 0815  WBC 7.0 10.3 12.5*  RBC 3.23* 2.56* 2.57*  HGB 9.0* 7.1* 7.1*  HCT 27.8* 22.2* 22.3*  MCV 86.1 86.7 86.8  MCH 27.9 27.7 27.6  MCHC 32.4 32.0 31.8  RDW 13.9 14.0 13.8  PLT 247 217 204   High Sensitivity Troponin:   Recent Labs  Lab 05/28/23 1420  TROPONINIHS 16     Cardiac EnzymesNo results for input(s): "TROPONINI" in the last 168 hours. No results for input(s): "TROPIPOC" in the last 168 hours.  BNP Recent Labs  Lab 05/28/23 1420 05/29/23 0031  BNP 1,292.5* 1,993.0*    DDimer No results for input(s): "DDIMER" in the last 168 hours.  Hemoglobin A1c: No results found for: "HGBA1C", "MPG" TSH   Recent Labs    05/28/23 1240  TSH 1.427   Lipid Panel  Lab Results  Component Value Date   CHOL 165 05/30/2023   HDL 27 (L) 05/30/2023   LDLCALC 122 (H) 05/30/2023   LDLDIRECT 118 (H) 05/30/2023   TRIG 82 05/30/2023   CHOLHDL 6.1 05/30/2023   Drugs of Abuse  No results found for: "LABOPIA", "COCAINSCRNUR", "LABBENZ", "AMPHETMU", "THCU", "LABBARB"    Imaging: VAS US CAROTID  Result Date: 05/29/2023 Carotid Arterial Duplex Study Patient Name:  Gwendolyn [name]  Date of Exam:   05/29/2023 Medical Rec #: [id number]           Accession #:    [id number] Date of Birth: Sep 05, 1956          Patient Gender: F Patient Age:   28 years Exam Location:  [facility] Procedure:      VAS US CAROTID Referring Phys: Tessa Lerner --------------------------------------------------------------------------------  Indications:       Bilateral bruits. Risk Factors:      Hypertension, hyperlipidemia, current smoker, PAD. Other Factors:     COPD, severe atherosclerotic calcification resulting in sever                    stenosis of the aorta and focal stenosis of the celiac and                    origin of the right renal artery. Comparison Study:  No prior study on file Performing Technologist: Sherren Kerns RVS  Examination Guidelines: A complete evaluation includes B-mode imaging, spectral Doppler, color Doppler, and power Doppler as needed of all accessible portions of each vessel. Bilateral testing is considered an integral part of a complete examination. Limited examinations for reoccurring indications may be performed as noted.  Right Carotid Findings: +----------+--------+--------+--------+------------------+--------+           PSV cm/sEDV cm/sStenosisPlaque DescriptionComments +----------+--------+--------+--------+------------------+--------+ CCA Prox  148     23              heterogenous               +----------+--------+--------+--------+------------------+--------+ CCA Distal133      20              heterogenous               +----------+--------+--------+--------+------------------+--------+  ICA Prox  192     31      1-39%   heterogenous               +----------+--------+--------+--------+------------------+--------+ ICA Mid   210     35                                         +----------+--------+--------+--------+------------------+--------+ ICA Distal118     29                                tortuous +----------+--------+--------+--------+------------------+--------+ ECA       190     6                                          +----------+--------+--------+--------+------------------+--------+ +----------+--------+-------+---------+-------------------+           PSV cm/sEDV cmsDescribe Arm Pressure (mmHG) +----------+--------+-------+---------+-------------------+ [id number]            Turbulent                    +----------+--------+-------+---------+-------------------+ +---------+--------+---+--------+--+--------+ VertebralPSV cm/s172EDV cm/s23Atypical +---------+--------+---+--------+--+--------+  Left Carotid Findings: +----------+--------+--------+--------+------------------+------------------+           PSV cm/sEDV cm/sStenosisPlaque DescriptionComments           +----------+--------+--------+--------+------------------+------------------+ CCA Prox  131     10                                intimal thickening +----------+--------+--------+--------+------------------+------------------+ CCA Distal107     17                                intimal thickening +----------+--------+--------+--------+------------------+------------------+ ICA Prox  192     26      1-39%   calcific                             +----------+--------+--------+--------+------------------+------------------+ ICA Mid   168     27                                                    +----------+--------+--------+--------+------------------+------------------+ ICA Distal78      16                                tortuous           +----------+--------+--------+--------+------------------+------------------+ ECA       360                                                          +----------+--------+--------+--------+------------------+------------------+ +----------+--------+--------+--------+-------------------+           PSV cm/sEDV cm/sDescribeArm Pressure (mmHG) +----------+--------+--------+--------+-------------------+ [id number]                                         +----------+--------+--------+--------+-------------------+ +---------+--------+---+--------+--+  VertebralPSV cm/s105EDV cm/s15 +---------+--------+---+--------+--+   Summary: Right Carotid: Velocities in the right ICA are consistent with a 1-39% stenosis. Left Carotid: Velocities in the left ICA are consistent with a 1-39% stenosis. Vertebrals:  Bilateral vertebral arteries demonstrate antegrade flow. Subclavians: Right subclavian artery flow was disturbed. Normal flow              hemodynamics were seen in the left subclavian artery. *See table(s) above for measurements and observations.  Electronically signed by Delia Heady MD on 05/29/2023 at 1:53:06 PM.    Final    ECHOCARDIOGRAM COMPLETE  Result Date: 05/29/2023    ECHOCARDIOGRAM REPORT   Patient Name:   KATHERINE TOUT Date of Exam: 05/29/2023 Medical Rec #:  [id number]          Height:       61.0 in Accession #:    [id number]         Weight:       155.0 lb Date of Birth:  1956/06/01         BSA:          1.695 m Patient Age:    66 years           BP:           159/72 mmHg Patient Gender: F                  HR:           73 bpm. Exam Location:  Inpatient Procedure: 2D Echo, Color Doppler and Cardiac Doppler Indications:    I50.31 Acute diastolic (congestive) heart failure  History:        Patient has prior history of  Echocardiogram examinations, most                 recent 02/05/2022. CHF, COPD; Risk Factors:Hypertension and                 Dyslipidemia.  Sonographer:    Irving Burton Senior RDCS Referring Phys: [phone] RONDELL A SMITH  Sonographer Comments: Suboptimal apical window due to COPD, wall motion obtained from short axis. IMPRESSIONS  1. Left ventricular ejection fraction, by estimation, is 50 to 55%. The left ventricle has low normal function. The left ventricle has no regional wall motion abnormalities. There is moderate left ventricular hypertrophy. Left ventricular diastolic parameters are consistent with Grade II diastolic dysfunction (pseudonormalization). Elevated left atrial pressure.  2. Right ventricular systolic function is normal. The right ventricular size is normal. Tricuspid regurgitation signal is inadequate for assessing PA pressure.  3. Left atrial size was severely dilated.  4. The mitral valve is normal in structure. No evidence of mitral valve regurgitation.  5. The aortic valve was not well visualized. Aortic valve regurgitation is mild. Moderate aortic valve stenosis. Vmax 3.0 m/s, MG , AVA 1.2 cm^2, DI 0.43  6. The inferior vena cava is dilated in size with <50% respiratory variability, suggesting right atrial pressure of 15 mmHg. FINDINGS  Left Ventricle: Left ventricular ejection fraction, by estimation, is 50 to 55%. The left ventricle has low normal function. The left ventricle has no regional wall motion abnormalities. The left ventricular internal cavity size was normal in size. There is moderate left ventricular hypertrophy. Left ventricular diastolic parameters are consistent with Grade II diastolic dysfunction (pseudonormalization). Elevated left atrial pressure. Right Ventricle: The right ventricular size is normal. No increase in right ventricular wall thickness. Right ventricular systolic function is normal. Tricuspid regurgitation signal is inadequate for assessing  PA pressure. Left  Atrium: Left atrial size was severely dilated. Right Atrium: Right atrial size was normal in size. Pericardium: Trivial pericardial effusion is present. Mitral Valve: The mitral valve is normal in structure. No evidence of mitral valve regurgitation. Tricuspid Valve: The tricuspid valve is normal in structure. Tricuspid valve regurgitation is trivial. Aortic Valve: The aortic valve was not well visualized. Aortic valve regurgitation is mild. Aortic regurgitation PHT measures 350 msec. Moderate aortic stenosis is present. Aortic valve mean gradient measures 18.0 mmHg. Aortic valve peak gradient measures 36.0 mmHg. Aortic valve area, by VTI measures 1.18 cm. Pulmonic Valve: The pulmonic valve was not well visualized. Pulmonic valve regurgitation is trivial. Aorta: The aortic root and ascending aorta are structurally normal, with no evidence of dilitation. Venous: The inferior vena cava is dilated in size with less than 50% respiratory variability, suggesting right atrial pressure of 15 mmHg. IAS/Shunts: The interatrial septum was not well visualized.  LEFT VENTRICLE PLAX 2D LVIDd:         5.10 cm   Diastology LVIDs:         3.70 cm   LV e' medial:    5.44 cm/s LV PW:         1.50 cm   LV E/e' medial:  19.5 LV IVS:        1.20 cm   LV e' lateral:   4.24 cm/s LVOT diam:     1.90 cm   LV E/e' lateral: 25.0 LV SV:         72 LV SV Index:   43 LVOT Area:     2.84 cm  RIGHT VENTRICLE RV S prime:     13.50 cm/s TAPSE (M-mode): 2.4 cm LEFT ATRIUM             Index        RIGHT ATRIUM           Index LA diam:        4.40 cm 2.60 cm/m   RA Area:     16.90 cm LA Vol (A2C):   94.7 ml 55.88 ml/m  RA Volume:   41.20 ml  24.31 ml/m LA Vol (A4C):   84.7 ml 49.98 ml/m LA Biplane Vol: 92.8 ml 54.76 ml/m  AORTIC VALVE AV Area (Vmax):    1.22 cm AV Area (Vmean):   1.18 cm AV Area (VTI):     1.18 cm AV Vmax:           300.00 cm/s AV Vmean:          194.000 cm/s AV VTI:            0.612 m AV Peak Grad:      36.0 mmHg AV Mean  Grad:      18.0 mmHg LVOT Vmax:         129.00 cm/s LVOT Vmean:        80.800 cm/s LVOT VTI:          0.255 m LVOT/AV VTI ratio: 0.42 AI PHT:            350 msec  AORTA Ao Root diam: 2.90 cm Ao Asc diam:  3.30 cm MITRAL VALVE MV Area (PHT): 3.31 cm     SHUNTS MV Decel Time: 229 msec     Systemic VTI:  0.26 m MV E velocity: 106.00 cm/s  Systemic Diam: 1.90 cm MV A velocity: 94.40 cm/s MV E/A ratio:  1.12 Epifanio Lesches MD Electronically signed by Epifanio Lesches  MD Signature Date/Time: 05/29/2023/1:16:29 PM    Final    CT Angio Chest/Abd/Pel for Dissection W and/or W/WO  Result Date: 05/28/2023 CLINICAL DATA:  Acute aortic syndrome. EXAM: CT ANGIOGRAPHY CHEST, ABDOMEN AND PELVIS TECHNIQUE: Non-contrast CT of the chest was initially obtained. Multidetector CT imaging through the chest, abdomen and pelvis was performed using the standard protocol during bolus administration of intravenous contrast. Multiplanar reconstructed images and MIPs were obtained and reviewed to evaluate the vascular anatomy. RADIATION DOSE REDUCTION: This exam was performed according to the departmental dose-optimization program which includes automated exposure control, adjustment of the mA and/or kV according to patient size and/or use of iterative reconstruction technique. CONTRAST:  OMNIPAQUE IOHEXOL 350 MG/ML SOLN COMPARISON:  CT abdomen and pelvis 08/27/2021 FINDINGS: CTA CHEST FINDINGS Cardiovascular: Preferential opacification of the thoracic aorta. No evidence of thoracic aortic aneurysm or dissection. Heart is mildly enlarged. There are moderate calcified atherosclerotic plaque throughout the aorta. Moderate severe focal stenosis identified of the origins of the left subclavian and left common carotid artery secondary to calcified atherosclerotic disease. No pericardial effusion. Mediastinum/Nodes: There are enlarged paratracheal lymph nodes measuring up to 12 mm short axis. There are enlarged prevascular lymph  nodes measuring up to 10 mm short axis. Enlarged subcarinal lymph node measures 15 mm short axis. Esophagus is within normal limits. The thyroid gland is not well seen. Lungs/Pleura: There are small bilateral pleural effusions with compressive atelectasis in the bilateral lower lobes. There is also small amount of atelectasis in the lingula. No pneumothorax. Trachea and central airways are patent. Musculoskeletal: No chest wall abnormality. No acute or significant osseous findings. Review of the MIP images confirms the above findings. CTA ABDOMEN AND PELVIS FINDINGS VASCULAR Aorta: Severe atherosclerotic calcifications are again seen. This results in severe stenosis of the aorta in the proximal abdominal aorta at the level of the celiac axis, superior mesenteric arteries and renal arteries. Infrarenal abdominal aorta is nondilated. There is no evidence for dissection. Celiac: There is moderate severe focal stenosis of the origin of the celiac artery. The celiac artery and branches are otherwise within normal limits. SMA: Patent without evidence of aneurysm, dissection, vasculitis or significant stenosis. Renals: Moderate severe focal stenosis of the origin of the right renal artery. Left renal artery within normal limits. Both arteries are patent. No aneurysm or thrombosis. IMA: Patent without evidence of aneurysm, dissection, vasculitis or significant stenosis. Inflow: Patent without evidence of aneurysm, dissection, vasculitis or significant stenosis. Mild calcified atherosclerotic disease present. Veins: No obvious venous abnormality within the limitations of this arterial phase study. Review of the MIP images confirms the above findings. NON-VASCULAR Hepatobiliary: No focal liver abnormality is seen. Status post cholecystectomy. No biliary dilatation. Pancreas: Unremarkable. No pancreatic ductal dilatation or surrounding inflammatory changes. Spleen: Normal in size without focal abnormality. Adrenals/Urinary  Tract: Bilateral renal calculi are present including right inferior pole renal calyceal calculus measuring 13 x 10 by 6 mm. There is no hydronephrosis or perinephric fat stranding. The adrenal glands are within normal limits. The bladder is within normal limits. Stomach/Bowel: Stomach is within normal limits. Appendix is not seen. No evidence of bowel wall thickening, distention, or inflammatory changes. Lymphatic: There are nonenlarged upper abdominal and retroperitoneal lymph nodes similar to prior. Reproductive: Status post hysterectomy. No adnexal masses. Other: There is trace free fluid in the pelvis. Musculoskeletal: No fracture is seen. Review of the MIP images confirms the above findings. IMPRESSION: 1. No evidence for aortic dissection or aneurysm. 2. Stable severe atherosclerotic  disease of the abdominal aorta resulting in severe stenosis of the proximal abdominal aorta at the level of the celiac axis, superior mesenteric artery and renal arteries. 3. Moderate severe focal stenosis of the origins of the left subclavian and left common carotid arteries. 4. Moderate severe focal stenosis of the origin of the celiac artery. 5. Moderate severe focal stenosis of the origin of the right renal artery. 6. Small bilateral pleural effusions with compressive atelectasis. 7. Mediastinal lymphadenopathy of uncertain etiology. 8. Trace free fluid in the pelvis. 9. Nonobstructing bilateral renal calculi. Electronically Signed   By: Darliss Cheney M.D.   On: 05/28/2023 22:39   DG Chest Port 1 View  Result Date: 05/28/2023 CLINICAL DATA:  Shortness of breath. EXAM: PORTABLE CHEST 1 VIEW COMPARISON:  10/18/2022 FINDINGS: Aortic atherosclerotic calcifications. Stable cardiac enlargement. Low lung volumes. Small bilateral pleural effusions and mild interstitial edema. Scar versus atelectasis in the lateral left base. IMPRESSION: Cardiomegaly, small bilateral pleural effusions and mild interstitial edema. Correlate for any  signs or symptoms of CHF. Electronically Signed   By: Signa Kell M.D.   On: 05/28/2023 13:03    CARDIAC DATABASE: EKG: May 28, 2023: Sinus rhythm, 92 bpm, ST-T changes in the lateral leads consider LVH with repolarization abnormality but ischemia cannot be ruled out.    Echocardiogram: 02/03/2022:  1. Left ventricular ejection fraction, by estimation, is 50 to 55%. The  left ventricle has low normal function. The left ventricle has no regional  wall motion abnormalities. There is mild left ventricular hypertrophy.  Left ventricular diastolic  parameters are consistent with Grade II diastolic dysfunction  (pseudonormalization).   2. Right ventricular systolic function is normal. The right ventricular  size is normal.   3. Left atrial size was severely dilated.   4. The mitral valve is normal in structure. Mild mitral valve  regurgitation. No evidence of mitral stenosis.   5. The aortic valve is tricuspid. Aortic valve regurgitation is moderate.   6. The inferior vena cava is normal in size with <50% respiratory  variability, suggesting right atrial pressure of 8 mmHg.   Comparison(s): A prior study was performed on 09/09/2022. No significant  change.   05/29/2023:  1. Left ventricular ejection fraction, by estimation, is 50 to 55%. The  left ventricle has low normal function. The left ventricle has no regional  wall motion abnormalities. There is moderate left ventricular hypertrophy.  Left ventricular diastolic  parameters are consistent with Grade II diastolic dysfunction  (pseudonormalization). Elevated left atrial pressure.   2. Right ventricular systolic function is normal. The right ventricular  size is normal. Tricuspid regurgitation signal is inadequate for assessing  PA pressure.   3. Left atrial size was severely dilated.   4. The mitral valve is normal in structure. No evidence of mitral valve  regurgitation.   5. The aortic valve was not well visualized. Aortic  valve regurgitation  is mild. Moderate aortic valve stenosis. Vmax 3.0 m/s, MG , AVA 1.2  cm^2, DI 0.43   6. The inferior vena cava is dilated in size with <50% respiratory  variability, suggesting right atrial pressure of 15 mmHg.    Stress Testing:  Exercise nuclear stress test 03/28/2022: Normal ECG stress. The patient exercised for 2 minutes and 59 seconds of a Bruce protocol, achieving approximately 4.64 METs and achieved 88% MPHR. Stress terminated due to fatigue.  The heart rate response was accelerated. Exercise capacity was severely reduced.   The baseline blood pressure was 200/70 mmHg  and increased to 230/76 mmHg, which is a normal response to exercise. Myocardial perfusion is normal. The LV is mildly dilated in both rest and stress. Overall LV systolic function is mildly abnormal without regional wall motion abnormalities. Stress LV EF: 43%.  No previous exam available for comparison. Intermediate risk. Findings may suggest non ischemic cardiomyopathy.    Abdominal arteriogram and selective left renal arteriogram and lithotripsy balloon angioplasty of the suprarenal abdominal aorta using shockwave M5 catheter 02/03/2022: Abdominal aorta: 2 renal arteries 1 on either sides, mild atherosclerotic changes are present.  Abdominal aorta is diffusely diseased and calcific.  There is a high-grade 90% to 99% stenosis of the suprarenal abdominal aorta with heavy calcification. Intervention data: Successful shockwave lithotripsy using a 8.0 x 60 mm shockwave M5 balloon, gradual progression of balloon inflation pressure from 4 atmospheric pressure already up to 10 atm pressure and using a 8.43 mm lumen, followed by balloon angioplasty with a 4.0 x 60 mm Mustang balloon at 4 atmospheric pressure. Stenosis reduced from 95% to probably insignificant stenosis (<50%).  Angiogram not performed to conserve contrast and would have been poorly visualized with CO2.   Pressure gradient pre and post:   Opening right common femoral artery pressure 52/44 mmHg.  Suprarenal thoracic aortic pressure 180/58 mmHg with a mean of 103 mmHg.  Pressure gradient of approximately 130 mmHg.  Closing right femoral artery pressure 121/62 mmHg.  Pressure gradient approximately 30 mmHg post angioplasty.  5-10 mL contrast utilized 20 to visualize abdominal aorta.  Right femoral arterial access closed with Perclose.  Recommendation: Start the patient on Plavix, I expect improvement in renal function, do not treat hypertension unless blood pressure >160 mmHg.  We will change all the antihypertensive medications.  Scheduled Meds:  amLODipine  10 mg Oral Daily   arformoterol  15 mcg Nebulization BID   budesonide (PULMICORT) nebulizer solution  0.25 mg Nebulization BID   dapagliflozin propanediol  10 mg Oral Daily   ezetimibe  10 mg Oral Daily   hydrALAZINE  100 mg Oral TID   ipratropium-albuterol  3 mL Nebulization BID   isosorbide mononitrate  60 mg Oral Daily   labetalol  400 mg Oral BID   levothyroxine  100 mcg Oral Q0600   methylPREDNISolone (SOLU-MEDROL) injection  80 mg Intravenous Daily   nicotine  21 mg Transdermal Daily   pantoprazole  40 mg Oral Daily   rosuvastatin  40 mg Oral Daily   sodium chloride flush  3 mL Intravenous Q12H    Continuous Infusions:  nitroGLYCERIN 70 mcg/min (05/29/23 2300)    PRN Meds: acetaminophen **OR** acetaminophen, albuterol, chlorpheniramine-HYDROcodone, hydrALAZINE, ondansetron (ZOFRAN) IV   IMPRESSION & RECOMMENDATIONS: HANIYAH MACIOLEK is a 67 y.o. Caucasian female whose past medical history and cardiac risk factors include:  resistant hypertension, mixed hyperlipidemia, nicotine dependence, COPD, known distal aorta stenosis (s/p successful Abdominal arteriogram and selective left renal arteriogram and lithotripsy balloon angioplasty of the suprarenal abdominal aorta using shockwave M5 catheter 02/03/2022), chronic HFpEF, hx of hypertensive emergency.    Impression: Hypertensive emergency on admission -resolved Resistant hypertension Acute kidney injury Acute on chronic HFpEF, stage C, NYHA class III. Aortic stenosis, moderate Hyperlipidemia, pure Acute COPD exacerbation. Recent COVID-19 infection. Cigarette smoking. Hyperlipidemia. Abdominal aortic stenosis status post angioplasty Renal artery stenosis Left subclavian and common carotid artery stenosis per imaging Moderate/severe celiac artery stenosis per imaging Mediastinal lymphadenopathy  Recommendations: Hypertensive emergency on admission: Resistant hypertension at baseline. Renal artery stenosis per imaging Blood pressures are improving. Discontinue  losartan in the setting of renal artery stenosis and acute kidney injury. Started Imdur 60 mg p.o. daily and wean off nitro drip Medications reconciled. Monitor BUN and creatinine  AKI:  Likely secondary to correcting her BP Monitor BUN and Cr.  D/c ARB Monitor for now.   Acute on chronic HFpEF, stage C, NYHA class III Shortness of breath improving. Will transition her from lisinopril to losartan but given the acute kidney injury and renal artery stenosis we will hold off on ARB for now. Continue Farxiga. May consider initiating spironolactone as outpatient -will hold off now given AKI. Net IO Since Admission: -932.21 mL [05/30/23 1033] Will need close outpatient follow-up.  Moderate aortic stenosis: Needs longitudinal follow-up. Blood pressure management for now.  Acute COPD exacerbation: Recent COVID-19 infection. Cigarette smoking. COPD and post-COVID management per primary team. Reemphasized importance of complete smoking cessation in the setting of underlying COPD and extensive vascular disease. 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 Most recent lipid profile reviewed. LDL still not at goal despite  being on high intensity statin therapy. Start Zetia 10 mg p.o. daily. Consider PCSK9 inhibitors as outpatient given the degree of vascular disease. LP(a) still is pending  Abdominal aortic stenosis status post angioplasty Renal artery stenosis Left subclavian and common carotid artery stenosis per imaging Moderate/severe celiac artery stenosis per imaging After acute hospitalization recommend reestablishing care with Dr. Jacinto Halim to discuss long-term management. Clinically she has not lost any significant weight and does not have postprandial symptoms.  She also does not complain of pain with overhead activities.  Blood pressure management as discussed above We will start her on aspirin 81 mg p.o. daily given the degree of atherosclerotic burden. Lipid management as discussed above  Mediastinal lymphadenopathy: Defer to primary team.  Noted on CTA of the chest abdomen pelvis.  Would recommend further workup given her prolonged history of smoking.  Patient's questions and concerns were addressed to her satisfaction. She voices understanding of the instructions provided during this encounter.   This note was created using a voice recognition software as a result there may be grammatical errors inadvertently enclosed that do not reflect the nature of this encounter. Every attempt is made to correct such errors.  Delilah Shan [facility]  Pager:  [phone] Office: [phone] 05/30/2023, 10:33 AM

## 2023-05-30 NOTE — TOC Initial Note (Signed)
Transition of Care [facility]) - Initial/Assessment Note    Patient Details  Name: Gwendolyn [name] MRN: [id number] Date of Birth: 06/26/1956  [facility]) CM/SW Contact:    Leone Haven, RN Phone Number: 05/30/2023, 7:03 PM  Clinical Narrative:                 From home with son, Richard.  Pta self ambulatory, indep.  She states she has PCP, Dr.Moreira, she has insurance on file.  She states she currently has no HH services in place or DME at home.  She states she still works full time.  Her son will transport her home at dc, he is her support system.  She gets her medications from [facility] on [address].   Expected Discharge Plan: Home/Self Care Barriers to Discharge: Continued Medical Work up   Patient Goals and CMS Choice Patient states their goals for this hospitalization and ongoing recovery are:: return home   Choice offered to / list presented to : NA      Expected Discharge Plan and Services In-house Referral: NA Discharge Planning Services: CM Consult Post Acute Care Choice: NA Living arrangements for the past 2 months: Single Family Home                 DME Arranged: N/A DME Agency: NA       HH Arranged: NA          Prior Living Arrangements/Services Living arrangements for the past 2 months: Single Family Home Lives with:: Adult Children Patient language and need for interpreter reviewed:: Yes Do you feel safe going back to the place where you live?: Yes      Need for Family Participation in Patient Care: Yes (Comment) Care giver support system in place?: Yes (comment)   Criminal Activity/Legal Involvement Pertinent to Current Situation/Hospitalization: No - Comment as needed  Activities of Daily Living      Permission Sought/Granted Permission sought to share information with : Case Manager Permission granted to share information with : Yes, Verbal Permission Granted              Emotional Assessment Appearance:: Appears  stated age Attitude/Demeanor/Rapport: Engaged Affect (typically observed): Appropriate Orientation: : Oriented to Self, Oriented to Place, Oriented to  Time, Oriented to Situation Alcohol / Substance Use: Not Applicable Psych Involvement: No (comment)  Admission diagnosis:  Orthopnea [R06.01] CHF (congestive heart failure) (HCC) [I50.9] Systolic CHF (HCC) [I50.20] Congestive heart failure, unspecified HF chronicity, unspecified heart failure type (HCC) [I50.9] Patient Active Problem List   Diagnosis Date Noted   Resistant hypertension 05/29/2023   Renal artery stenosis (HCC) 05/29/2023   Aortic valve stenosis 05/29/2023   Cigarette smoker 05/29/2023   Systolic CHF (HCC) 05/28/2023   Hypertensive urgency 05/28/2023   Hyponatremia 05/28/2023   Hyperlipidemia 05/28/2023   History of COVID-19 05/28/2023   Shortness of breath 05/28/2023   Hypertensive emergency 05/28/2023   Shortness of breath with exposure to COVID-19 virus 05/28/2023   Bilateral carotid bruits 05/28/2023   Acute exacerbation of chronic obstructive pulmonary disease (COPD) (HCC) 05/28/2023   Acute heart failure with preserved ejection fraction (HFpEF) (HCC) 05/28/2023   Vitamin B12 deficiency 10/21/2022   Normocytic anemia 10/18/2022   Heart failure with mildly reduced ejection fraction (HFmrEF) (HCC) 10/18/2022   Essential hypertension 10/18/2022   Hypothyroidism 10/18/2022   Abdominal aortic stenosis    Peripheral artery disease (HCC)    H/O renal atherosclerosis    Respiratory distress    AKI (  acute kidney injury) (HCC)    Acute on chronic heart failure with preserved ejection fraction (HCC) 02/01/2022   COPD (chronic obstructive pulmonary disease) (HCC) 02/01/2022   PCP:  Ralene Ok, MD Pharmacy:   [facility] [phone] - [place], [place] - [address] AT [facility] [address] [facility] [address] [place] [address] Phone: [phone] Fax: [phone]     Social  Determinants of Health (SDOH) Social History: SDOH Screenings   Food Insecurity: No Food Insecurity (10/19/2022)  Housing: Low Risk  (10/19/2022)  Transportation Needs: No Transportation Needs (10/19/2022)  Utilities: Not At Risk (10/19/2022)  Alcohol Screen: Low Risk  (02/02/2022)  Financial Resource Strain: Low Risk  (02/02/2022)  Tobacco Use: High Risk (05/28/2023)   SDOH Interventions:     Readmission Risk Interventions    02/04/2022    3:13 PM  Readmission Risk Prevention Plan  Transportation Screening Complete  PCP or Specialist Appt within 3-5 Days Complete  HRI or Home Care Consult Complete  Social Work Consult for Recovery Care Planning/Counseling Complete  Palliative Care Screening Not Applicable  Medication Review Oceanographer) Complete

## 2023-05-30 NOTE — Progress Notes (Signed)
Pt received 2 units blood today, just finished 2nd unit around shift change and pt reports that she has been more short of breath since first unit started today. BP  210/64. Had 10mg  IV hydralazine prior to shift change.Denies pain. Sats>92% on 3Lnc. Noted lasix held today due to increased creatinine.   Night MD notified of above and of increased creatinine. Order received for one time dose lasix. Scheduled labetalol also given.   BP trending down and pt reports starting to feel improvement in shortness of breath.   05/30/23 2014  Assess: MEWS Score  BP (!) 210/64  MAP (mmHg) 104  ECG Heart Rate 89  Resp (!) 21  SpO2 96 %  O2 Device Nasal Cannula  O2 Flow Rate (L/min) 3 L/min  Assess: MEWS Score  MEWS Temp 0  MEWS Systolic 2  MEWS Pulse 0  MEWS RR 1  MEWS LOC 0  MEWS Score 3  MEWS Score Color Yellow  Assess: if the MEWS score is Yellow or Red  Were vital signs taken at a resting state? Yes  Focused Assessment No change from prior assessment  Does the patient meet 2 or more of the SIRS criteria? Yes  Does the patient have a confirmed or suspected source of infection? No  MEWS guidelines implemented  Yes, yellow  Treat  MEWS Interventions Considered administering scheduled or prn medications/treatments as ordered  Take Vital Signs  Increase Vital Sign Frequency  Yellow: Q2hr x1, continue Q4hrs until patient remains green for 12hrs  Escalate  MEWS: Escalate Yellow: Discuss with charge nurse and consider notifying provider and/or RRT  Notify: Charge Nurse/RN  Name of Charge Nurse/RN Notified Felicia RN  Provider Notification  Provider Name/Title Dr. Imogene Burn  Date Provider Notified 05/30/23  Time Provider Notified 2021  Method of Notification  (secure chat)  Notification Reason Other (Comment) (see note)  Provider response See new orders  Date of Provider Response 05/30/23  Time of Provider Response 2021  Assess: SIRS CRITERIA  SIRS Temperature  0  SIRS Pulse 0  SIRS  Respirations  1  SIRS WBC 1  SIRS Score Sum  2

## 2023-05-30 NOTE — Consult Note (Signed)
Reason for Consult: IDA Referring Physician: [facility]  [name] HPI: This is a 67 year old female with a PMH of IDA, COPD, continued smoking, HTN, HFpEF, and hyperlipidemia admitted for SOB.  She was recently treated with Augmentin for a lingering cough after a COVID-19 infection.  Because of the persistent SOB she presented to the hospital with findings of an elevated BNP, pleural effusions, and an anemia.  Over the course of  her hospitalization her HGB dropped down from 10 to 7.1 g/dL.  She did not exhibit any overt evidence of bleeding.  In 10/2022 she had an anemia of 5.3 g/dL and she was evaluated endoscopically without finding any bleeding source.  Nonbleeding gastric erosions were found and multiple TAs were removed from the colon.  The plan as an outpatient was to obtain a VCE, but she cancelled the procedure secondary to a scheduling conflict.  Past Medical History:  Diagnosis Date   Anemia    Anxiety    Asthma    COPD (chronic obstructive pulmonary disease) (HCC)    Depression    GERD (gastroesophageal reflux disease)    History of kidney stones    Hypertension    Hypothyroidism     Past Surgical History:  Procedure Laterality Date   ABDOMINAL AORTOGRAM W/LOWER EXTREMITY N/A 02/03/2022   Procedure: ABDOMINAL AORTOGRAM W/LOWER EXTREMITY;  Surgeon: Yates Decamp, MD;  Location: [facility];  Service: Cardiovascular;  Laterality: N/A;   ABDOMINAL HYSTERECTOMY     BIOPSY  10/21/2022   Procedure: BIOPSY;  Surgeon: Jeani Hawking, MD;  Location: [facility];  Service: Gastroenterology;;   Cesarean Section     (2)   CHOLECYSTECTOMY     COLONOSCOPY  2020   COLONOSCOPY WITH PROPOFOL N/A 10/21/2022   Procedure: COLONOSCOPY WITH PROPOFOL;  Surgeon: Jeani Hawking, MD;  Location: [facility];  Service: Gastroenterology;  Laterality: N/A;   ESOPHAGOGASTRODUODENOSCOPY (EGD) WITH PROPOFOL N/A 10/21/2022   Procedure: ESOPHAGOGASTRODUODENOSCOPY (EGD) WITH PROPOFOL;   Surgeon: Jeani Hawking, MD;  Location: [facility];  Service: Gastroenterology;  Laterality: N/A;   Gastric Ulcer  2020   PERIPHERAL VASCULAR BALLOON ANGIOPLASTY  02/03/2022   Procedure: PERIPHERAL VASCULAR BALLOON ANGIOPLASTY;  Surgeon: Yates Decamp, MD;  Location: [facility];  Service: Cardiovascular;;   POLYPECTOMY  10/21/2022   Procedure: POLYPECTOMY;  Surgeon: Jeani Hawking, MD;  Location: [facility];  Service: Gastroenterology;;   RENAL ANGIOGRAPHY N/A 02/03/2022   Procedure: RENAL ANGIOGRAPHY;  Surgeon: Yates Decamp, MD;  Location: [facility] INVASIVE CV LAB;  Service: Cardiovascular;  Laterality: N/A;   UMBILICAL HERNIA REPAIR N/A 10/29/2020   Procedure: OPEN UMBILICAL HERNIA REPAIR WITH MESH;  Surgeon: Stechschulte, Hyman Hopes, MD;  Location: [facility];  Service: General;  Laterality: N/A;    Family History  Problem Relation Age of Onset   Colon cancer Mother    Diabetes Father    Diabetes Sister    Breast cancer Sister 77   Hypertension Sister     Social History:  reports that she has been smoking cigarettes. She has a 40 pack-year smoking history. She has never used smokeless tobacco. She reports that she does not drink alcohol and does not use drugs.  Allergies:  Allergies  Allergen Reactions   Doxycycline Anaphylaxis and Rash   Azilsartan Cough   Biaxin [Clarithromycin] Diarrhea and Other (See Comments)    Vertigo   Floxacillin [Floxacillin (Flucloxacillin)] Other (See Comments)    Vertigo   Morphine And Codeine Other (See Comments)  Headache    Medications: Scheduled:  sodium chloride   Intravenous Once   amLODipine  10 mg Oral Daily   arformoterol  15 mcg Nebulization BID   budesonide (PULMICORT) nebulizer solution  0.25 mg Nebulization BID   cyanocobalamin  1,000 mcg Subcutaneous Q2000   dapagliflozin propanediol  10 mg Oral Daily   ezetimibe  10 mg Oral Daily   hydrALAZINE  100 mg Oral TID   ipratropium-albuterol  3 mL Nebulization BID   isosorbide mononitrate   60 mg Oral Daily   labetalol  400 mg Oral BID   levothyroxine  100 mcg Oral Q0600   methylPREDNISolone (SOLU-MEDROL) injection  80 mg Intravenous Daily   nicotine  21 mg Transdermal Daily   pantoprazole (PROTONIX) IV  40 mg Intravenous Q12H   rosuvastatin  40 mg Oral Daily   sodium chloride flush  3 mL Intravenous Q12H   Continuous:  nitroGLYCERIN Stopped (05/30/23 1222)    Results for orders placed or performed during the hospital encounter of 05/28/23 (from the past 24 hour(s))  Basic metabolic panel     Status: Abnormal   Collection Time: 05/30/23  1:34 AM  Result Value Ref Range   Sodium 131 (L) 135 - 145 mmol/L   Potassium 3.7 3.5 - 5.1 mmol/L   Chloride 96 (L) 98 - 111 mmol/L   CO2 23 22 - 32 mmol/L   Glucose, Bld 132 (H) 70 - 99 mg/dL   BUN 28 (H) 8 - 23 mg/dL   Creatinine, Ser 8.75 (H) 0.44 - 1.00 mg/dL   Calcium 8.9 8.9 - 64.3 mg/dL   GFR, Estimated 28 (L) >60 mL/min   Anion gap 12 5 - 15  CBC with Differential/Platelet     Status: Abnormal   Collection Time: 05/30/23  1:34 AM  Result Value Ref Range   WBC 10.3 4.0 - 10.5 K/uL   RBC 2.56 (L) 3.87 - 5.11 MIL/uL   Hemoglobin 7.1 (L) 12.0 - 15.0 g/dL   HCT 32.9 (L) 51.8 - 84.1 %   MCV 86.7 80.0 - 100.0 fL   MCH 27.7 26.0 - 34.0 pg   MCHC 32.0 30.0 - 36.0 g/dL   RDW 66.0 63.0 - 16.0 %   Platelets 217 150 - 400 K/uL   nRBC 0.2 0.0 - 0.2 %   Neutrophils Relative % 86 %   Neutro Abs 8.8 (H) 1.7 - 7.7 K/uL   Lymphocytes Relative 7 %   Lymphs Abs 0.7 0.7 - 4.0 K/uL   Monocytes Relative 6 %   Monocytes Absolute 0.6 0.1 - 1.0 K/uL   Eosinophils Relative 0 %   Eosinophils Absolute 0.0 0.0 - 0.5 K/uL   Basophils Relative 0 %   Basophils Absolute 0.0 0.0 - 0.1 K/uL   Immature Granulocytes 1 %   Abs Immature Granulocytes 0.08 (H) 0.00 - 0.07 K/uL  Lipid panel     Status: Abnormal   Collection Time: 05/30/23  1:34 AM  Result Value Ref Range   Cholesterol 165 0 - 200 mg/dL   Triglycerides 82 <109 mg/dL   HDL 27 (L)  >32 mg/dL   Total CHOL/HDL Ratio 6.1 RATIO   VLDL 16 0 - 40 mg/dL   LDL Cholesterol 355 (H) 0 - 99 mg/dL  LDL cholesterol, direct     Status: Abnormal   Collection Time: 05/30/23  1:34 AM  Result Value Ref Range   Direct LDL 118 (H) 0 - 99 mg/dL  Vitamin D32  Status: None   Collection Time: 05/30/23  8:15 AM  Result Value Ref Range   Vitamin B-12 196 180 - 914 pg/mL  Folate     Status: None   Collection Time: 05/30/23  8:15 AM  Result Value Ref Range   Folate 8.6 >5.9 ng/mL  Iron and TIBC     Status: Abnormal   Collection Time: 05/30/23  8:15 AM  Result Value Ref Range   Iron 17 (L) 28 - 170 ug/dL   TIBC 782 956 - 213 ug/dL   Saturation Ratios 4 (L) 10.4 - 31.8 %   UIBC 365 ug/dL  Ferritin     Status: Abnormal   Collection Time: 05/30/23  8:15 AM  Result Value Ref Range   Ferritin 10 (L) 11 - 307 ng/mL  CBC     Status: Abnormal   Collection Time: 05/30/23  8:15 AM  Result Value Ref Range   WBC 12.5 (H) 4.0 - 10.5 K/uL   RBC 2.57 (L) 3.87 - 5.11 MIL/uL   Hemoglobin 7.1 (L) 12.0 - 15.0 g/dL   HCT 08.6 (L) 57.8 - 46.9 %   MCV 86.8 80.0 - 100.0 fL   MCH 27.6 26.0 - 34.0 pg   MCHC 31.8 30.0 - 36.0 g/dL   RDW 62.9 52.8 - 41.3 %   Platelets 204 150 - 400 K/uL   nRBC 0.0 0.0 - 0.2 %  Basic metabolic panel     Status: Abnormal   Collection Time: 05/30/23  8:15 AM  Result Value Ref Range   Sodium 128 (L) 135 - 145 mmol/L   Potassium 3.7 3.5 - 5.1 mmol/L   Chloride 95 (L) 98 - 111 mmol/L   CO2 23 22 - 32 mmol/L   Glucose, Bld 113 (H) 70 - 99 mg/dL   BUN 34 (H) 8 - 23 mg/dL   Creatinine, Ser 2.44 (H) 0.44 - 1.00 mg/dL   Calcium 8.8 (L) 8.9 - 10.3 mg/dL   GFR, Estimated 24 (L) >60 mL/min   Anion gap 10 5 - 15  Brain natriuretic peptide     Status: Abnormal   Collection Time: 05/30/23  8:15 AM  Result Value Ref Range   B Natriuretic Peptide 1,252.8 (H) 0.0 - 100.0 pg/mL  Type and screen [facility]     Status: None (Preliminary result)   Collection  Time: 05/30/23  8:16 AM  Result Value Ref Range   ABO/RH(D) O POS    Antibody Screen NEG    Sample Expiration 06/02/2023,2359    Unit Number [id number]    Blood Component Type RED CELLS,LR    Unit division 00    Status of Unit ISSUED    Transfusion Status OK TO TRANSFUSE    Crossmatch Result      Compatible Performed at [facility], [address] [place], [place] [address]    Unit Number [id number]    Blood Component Type RBC LR PHER1    Unit division 00    Status of Unit ALLOCATED    Transfusion Status OK TO TRANSFUSE    Crossmatch Result Compatible   Prepare RBC (crossmatch)     Status: None   Collection Time: 05/30/23 11:09 AM  Result Value Ref Range   Order Confirmation      ORDER PROCESSED BY BLOOD BANK Performed at [facility], [address], [place], [place] [address]      VAS US CAROTID  Result Date: 05/29/2023 Carotid Arterial Duplex Study  Patient Name:  Gwendolyn [name]  Date of Exam:   05/29/2023 Medical Rec #: [id number]           Accession #:    [id number] Date of Birth: 1956-05-21          Patient Gender: F Patient Age:   2 years Exam Location:  [facility] Procedure:      VAS US CAROTID Referring Phys: Tessa Lerner --------------------------------------------------------------------------------  Indications:       Bilateral bruits. Risk Factors:      Hypertension, hyperlipidemia, current smoker, PAD. Other Factors:     COPD, severe atherosclerotic calcification resulting in sever                    stenosis of the aorta and focal stenosis of the celiac and                    origin of the right renal artery. Comparison Study:  No prior study on file Performing Technologist: Sherren Kerns RVS  Examination Guidelines: A complete evaluation includes B-mode imaging, spectral Doppler, color Doppler, and power Doppler as needed of all accessible portions of each vessel. Bilateral testing is considered an integral part of a complete examination.  Limited examinations for reoccurring indications may be performed as noted.  Right Carotid Findings: +----------+--------+--------+--------+------------------+--------+           PSV cm/sEDV cm/sStenosisPlaque DescriptionComments +----------+--------+--------+--------+------------------+--------+ CCA Prox  148     23              heterogenous               +----------+--------+--------+--------+------------------+--------+ CCA Distal133     20              heterogenous               +----------+--------+--------+--------+------------------+--------+ ICA Prox  192     31      1-39%   heterogenous               +----------+--------+--------+--------+------------------+--------+ ICA Mid   210     35                                         +----------+--------+--------+--------+------------------+--------+ ICA Distal118     29                                tortuous +----------+--------+--------+--------+------------------+--------+ ECA       190     6                                          +----------+--------+--------+--------+------------------+--------+ +----------+--------+-------+---------+-------------------+           PSV cm/sEDV cmsDescribe Arm Pressure (mmHG) +----------+--------+-------+---------+-------------------+ [id number]            Turbulent                    +----------+--------+-------+---------+-------------------+ +---------+--------+---+--------+--+--------+ VertebralPSV cm/s172EDV cm/s23Atypical +---------+--------+---+--------+--+--------+  Left Carotid Findings: +----------+--------+--------+--------+------------------+------------------+           PSV cm/sEDV cm/sStenosisPlaque DescriptionComments           +----------+--------+--------+--------+------------------+------------------+ CCA Prox  131     10  intimal thickening  +----------+--------+--------+--------+------------------+------------------+ CCA Distal107     17                                intimal thickening +----------+--------+--------+--------+------------------+------------------+ ICA Prox  192     26      1-39%   calcific                             +----------+--------+--------+--------+------------------+------------------+ ICA Mid   168     27                                                   +----------+--------+--------+--------+------------------+------------------+ ICA Distal78      16                                tortuous           +----------+--------+--------+--------+------------------+------------------+ ECA       360                                                          +----------+--------+--------+--------+------------------+------------------+ +----------+--------+--------+--------+-------------------+           PSV cm/sEDV cm/sDescribeArm Pressure (mmHG) +----------+--------+--------+--------+-------------------+ [id number]                                         +----------+--------+--------+--------+-------------------+ +---------+--------+---+--------+--+ VertebralPSV cm/s105EDV cm/s15 +---------+--------+---+--------+--+   Summary: Right Carotid: Velocities in the right ICA are consistent with a 1-39% stenosis. Left Carotid: Velocities in the left ICA are consistent with a 1-39% stenosis. Vertebrals:  Bilateral vertebral arteries demonstrate antegrade flow. Subclavians: Right subclavian artery flow was disturbed. Normal flow              hemodynamics were seen in the left subclavian artery. *See table(s) above for measurements and observations.  Electronically signed by Delia Heady MD on 05/29/2023 at 1:53:06 PM.    Final    ECHOCARDIOGRAM COMPLETE  Result Date: 05/29/2023    ECHOCARDIOGRAM REPORT   Patient Name:   PRESCIOUS HURLESS Date of Exam: 05/29/2023 Medical Rec #:  [id number]           Height:       61.0 in Accession #:    [id number]         Weight:       155.0 lb Date of Birth:  04-Sep-1956         BSA:          1.695 m Patient Age:    66 years           BP:           159/72 mmHg Patient Gender: F                  HR:           73 bpm. Exam Location:  Inpatient Procedure: 2D Echo, Color Doppler and Cardiac Doppler Indications:  I50.31 Acute diastolic (congestive) heart failure  History:        Patient has prior history of Echocardiogram examinations, most                 recent 02/05/2022. CHF, COPD; Risk Factors:Hypertension and                 Dyslipidemia.  Sonographer:    Irving Burton Senior RDCS Referring Phys: [id number] RONDELL A SMITH  Sonographer Comments: Suboptimal apical window due to COPD, wall motion obtained from short axis. IMPRESSIONS  1. Left ventricular ejection fraction, by estimation, is 50 to 55%. The left ventricle has low normal function. The left ventricle has no regional wall motion abnormalities. There is moderate left ventricular hypertrophy. Left ventricular diastolic parameters are consistent with Grade II diastolic dysfunction (pseudonormalization). Elevated left atrial pressure.  2. Right ventricular systolic function is normal. The right ventricular size is normal. Tricuspid regurgitation signal is inadequate for assessing PA pressure.  3. Left atrial size was severely dilated.  4. The mitral valve is normal in structure. No evidence of mitral valve regurgitation.  5. The aortic valve was not well visualized. Aortic valve regurgitation is mild. Moderate aortic valve stenosis. Vmax 3.0 m/s, MG , AVA 1.2 cm^2, DI 0.43  6. The inferior vena cava is dilated in size with <50% respiratory variability, suggesting right atrial pressure of 15 mmHg. FINDINGS  Left Ventricle: Left ventricular ejection fraction, by estimation, is 50 to 55%. The left ventricle has low normal function. The left ventricle has no regional wall motion abnormalities. The left ventricular  internal cavity size was normal in size. There is moderate left ventricular hypertrophy. Left ventricular diastolic parameters are consistent with Grade II diastolic dysfunction (pseudonormalization). Elevated left atrial pressure. Right Ventricle: The right ventricular size is normal. No increase in right ventricular wall thickness. Right ventricular systolic function is normal. Tricuspid regurgitation signal is inadequate for assessing PA pressure. Left Atrium: Left atrial size was severely dilated. Right Atrium: Right atrial size was normal in size. Pericardium: Trivial pericardial effusion is present. Mitral Valve: The mitral valve is normal in structure. No evidence of mitral valve regurgitation. Tricuspid Valve: The tricuspid valve is normal in structure. Tricuspid valve regurgitation is trivial. Aortic Valve: The aortic valve was not well visualized. Aortic valve regurgitation is mild. Aortic regurgitation PHT measures 350 msec. Moderate aortic stenosis is present. Aortic valve mean gradient measures 18.0 mmHg. Aortic valve peak gradient measures 36.0 mmHg. Aortic valve area, by VTI measures 1.18 cm. Pulmonic Valve: The pulmonic valve was not well visualized. Pulmonic valve regurgitation is trivial. Aorta: The aortic root and ascending aorta are structurally normal, with no evidence of dilitation. Venous: The inferior vena cava is dilated in size with less than 50% respiratory variability, suggesting right atrial pressure of 15 mmHg. IAS/Shunts: The interatrial septum was not well visualized.  LEFT VENTRICLE PLAX 2D LVIDd:         5.10 cm   Diastology LVIDs:         3.70 cm   LV e' medial:    5.44 cm/s LV PW:         1.50 cm   LV E/e' medial:  19.5 LV IVS:        1.20 cm   LV e' lateral:   4.24 cm/s LVOT diam:     1.90 cm   LV E/e' lateral: 25.0 LV SV:         72 LV SV Index:   43 LVOT  Area:     2.84 cm  RIGHT VENTRICLE RV S prime:     13.50 cm/s TAPSE (M-mode): 2.4 cm LEFT ATRIUM             Index         RIGHT ATRIUM           Index LA diam:        4.40 cm 2.60 cm/m   RA Area:     16.90 cm LA Vol (A2C):   94.7 ml 55.88 ml/m  RA Volume:   41.20 ml  24.31 ml/m LA Vol (A4C):   84.7 ml 49.98 ml/m LA Biplane Vol: 92.8 ml 54.76 ml/m  AORTIC VALVE AV Area (Vmax):    1.22 cm AV Area (Vmean):   1.18 cm AV Area (VTI):     1.18 cm AV Vmax:           300.00 cm/s AV Vmean:          194.000 cm/s AV VTI:            0.612 m AV Peak Grad:      36.0 mmHg AV Mean Grad:      18.0 mmHg LVOT Vmax:         129.00 cm/s LVOT Vmean:        80.800 cm/s LVOT VTI:          0.255 m LVOT/AV VTI ratio: 0.42 AI PHT:            350 msec  AORTA Ao Root diam: 2.90 cm Ao Asc diam:  3.30 cm MITRAL VALVE MV Area (PHT): 3.31 cm     SHUNTS MV Decel Time: 229 msec     Systemic VTI:  0.26 m MV E velocity: 106.00 cm/s  Systemic Diam: 1.90 cm MV A velocity: 94.40 cm/s MV E/A ratio:  1.12 Epifanio Lesches MD Electronically signed by Epifanio Lesches MD Signature Date/Time: 05/29/2023/1:16:29 PM    Final    CT Angio Chest/Abd/Pel for Dissection W and/or W/WO  Result Date: 05/28/2023 CLINICAL DATA:  Acute aortic syndrome. EXAM: CT ANGIOGRAPHY CHEST, ABDOMEN AND PELVIS TECHNIQUE: Non-contrast CT of the chest was initially obtained. Multidetector CT imaging through the chest, abdomen and pelvis was performed using the standard protocol during bolus administration of intravenous contrast. Multiplanar reconstructed images and MIPs were obtained and reviewed to evaluate the vascular anatomy. RADIATION DOSE REDUCTION: This exam was performed according to the departmental dose-optimization program which includes automated exposure control, adjustment of the mA and/or kV according to patient size and/or use of iterative reconstruction technique. CONTRAST:  OMNIPAQUE IOHEXOL 350 MG/ML SOLN COMPARISON:  CT abdomen and pelvis 08/27/2021 FINDINGS: CTA CHEST FINDINGS Cardiovascular: Preferential opacification of the thoracic aorta. No evidence of  thoracic aortic aneurysm or dissection. Heart is mildly enlarged. There are moderate calcified atherosclerotic plaque throughout the aorta. Moderate severe focal stenosis identified of the origins of the left subclavian and left common carotid artery secondary to calcified atherosclerotic disease. No pericardial effusion. Mediastinum/Nodes: There are enlarged paratracheal lymph nodes measuring up to 12 mm short axis. There are enlarged prevascular lymph nodes measuring up to 10 mm short axis. Enlarged subcarinal lymph node measures 15 mm short axis. Esophagus is within normal limits. The thyroid gland is not well seen. Lungs/Pleura: There are small bilateral pleural effusions with compressive atelectasis in the bilateral lower lobes. There is also small amount of atelectasis in the lingula. No pneumothorax. Trachea and central airways are patent. Musculoskeletal: No chest  wall abnormality. No acute or significant osseous findings. Review of the MIP images confirms the above findings. CTA ABDOMEN AND PELVIS FINDINGS VASCULAR Aorta: Severe atherosclerotic calcifications are again seen. This results in severe stenosis of the aorta in the proximal abdominal aorta at the level of the celiac axis, superior mesenteric arteries and renal arteries. Infrarenal abdominal aorta is nondilated. There is no evidence for dissection. Celiac: There is moderate severe focal stenosis of the origin of the celiac artery. The celiac artery and branches are otherwise within normal limits. SMA: Patent without evidence of aneurysm, dissection, vasculitis or significant stenosis. Renals: Moderate severe focal stenosis of the origin of the right renal artery. Left renal artery within normal limits. Both arteries are patent. No aneurysm or thrombosis. IMA: Patent without evidence of aneurysm, dissection, vasculitis or significant stenosis. Inflow: Patent without evidence of aneurysm, dissection, vasculitis or significant stenosis. Mild  calcified atherosclerotic disease present. Veins: No obvious venous abnormality within the limitations of this arterial phase study. Review of the MIP images confirms the above findings. NON-VASCULAR Hepatobiliary: No focal liver abnormality is seen. Status post cholecystectomy. No biliary dilatation. Pancreas: Unremarkable. No pancreatic ductal dilatation or surrounding inflammatory changes. Spleen: Normal in size without focal abnormality. Adrenals/Urinary Tract: Bilateral renal calculi are present including right inferior pole renal calyceal calculus measuring 13 x 10 by 6 mm. There is no hydronephrosis or perinephric fat stranding. The adrenal glands are within normal limits. The bladder is within normal limits. Stomach/Bowel: Stomach is within normal limits. Appendix is not seen. No evidence of bowel wall thickening, distention, or inflammatory changes. Lymphatic: There are nonenlarged upper abdominal and retroperitoneal lymph nodes similar to prior. Reproductive: Status post hysterectomy. No adnexal masses. Other: There is trace free fluid in the pelvis. Musculoskeletal: No fracture is seen. Review of the MIP images confirms the above findings. IMPRESSION: 1. No evidence for aortic dissection or aneurysm. 2. Stable severe atherosclerotic disease of the abdominal aorta resulting in severe stenosis of the proximal abdominal aorta at the level of the celiac axis, superior mesenteric artery and renal arteries. 3. Moderate severe focal stenosis of the origins of the left subclavian and left common carotid arteries. 4. Moderate severe focal stenosis of the origin of the celiac artery. 5. Moderate severe focal stenosis of the origin of the right renal artery. 6. Small bilateral pleural effusions with compressive atelectasis. 7. Mediastinal lymphadenopathy of uncertain etiology. 8. Trace free fluid in the pelvis. 9. Nonobstructing bilateral renal calculi. Electronically Signed   By: Darliss Cheney M.D.   On: 05/28/2023  22:39    ROS:  As stated above in the HPI otherwise negative.  Blood pressure (!) 150/63, pulse 76, temperature 98.9 F (37.2 C), temperature source Oral, resp. rate 18, height 5\' 1"  (1.549 m), weight 68.5 kg, SpO2 95%.    PE: Gen: NAD, Alert and Oriented, some increased work of breathing HEENT:  [place]/AT, EOMI, esotropia of the right eye, and exopthalmos Lungs: CTA Bilaterally CV: RRR without M/G/R ABD: Soft, NTND, +BS Ext: No C/C/E  Assessment/Plan: 1) IDA. 2) CHF. 3) Aortic stenosis. 4) COPD.   To complete the work up for her IDA a VCE will be ordered for tomorrow.  Plan: 1) VCE tomorrow. 2) Monitor HGB and transfuse as necessary.  Taronda Comacho D 05/30/2023, 2:53 PM

## 2023-05-31 ENCOUNTER — Encounter (HOSPITAL_COMMUNITY)
Admission: EM | Disposition: A | Discharge status: Home / Self Care | Payer: Self-pay | Source: Home / Self Care | Attending: Internal Medicine

## 2023-05-31 ENCOUNTER — Inpatient Hospital Stay (HOSPITAL_COMMUNITY): Payer: Medicare Other

## 2023-05-31 DIAGNOSIS — K922 Gastrointestinal hemorrhage, unspecified: Secondary | ICD-10-CM | POA: Insufficient documentation

## 2023-05-31 DIAGNOSIS — I5033 Acute on chronic diastolic (congestive) heart failure: Secondary | ICD-10-CM

## 2023-05-31 DIAGNOSIS — E7800 Pure hypercholesterolemia, unspecified: Secondary | ICD-10-CM

## 2023-05-31 DIAGNOSIS — J441 Chronic obstructive pulmonary disease with (acute) exacerbation: Secondary | ICD-10-CM | POA: Insufficient documentation

## 2023-05-31 DIAGNOSIS — D649 Anemia, unspecified: Secondary | ICD-10-CM | POA: Insufficient documentation

## 2023-05-31 DIAGNOSIS — N179 Acute kidney failure, unspecified: Secondary | ICD-10-CM

## 2023-05-31 DIAGNOSIS — I160 Hypertensive urgency: Secondary | ICD-10-CM | POA: Diagnosis not present

## 2023-05-31 DIAGNOSIS — F17200 Nicotine dependence, unspecified, uncomplicated: Secondary | ICD-10-CM | POA: Insufficient documentation

## 2023-05-31 DIAGNOSIS — I5031 Acute diastolic (congestive) heart failure: Secondary | ICD-10-CM | POA: Insufficient documentation

## 2023-05-31 HISTORY — PX: GIVENS CAPSULE STUDY: SHX5432

## 2023-05-31 LAB — CBC WITH DIFFERENTIAL/PLATELET
Abs Immature Granulocytes: 0.18 10*3/uL — ABNORMAL HIGH (ref 0.00–0.07)
Basophils Absolute: 0 10*3/uL (ref 0.0–0.1)
Basophils Relative: 0 %
Eosinophils Absolute: 0 10*3/uL (ref 0.0–0.5)
Eosinophils Relative: 0 %
HCT: 32.2 % — ABNORMAL LOW (ref 36.0–46.0)
Hemoglobin: 10.7 g/dL — ABNORMAL LOW (ref 12.0–15.0)
Immature Granulocytes: 1 %
Lymphocytes Relative: 5 %
Lymphs Abs: 0.6 10*3/uL — ABNORMAL LOW (ref 0.7–4.0)
MCH: 28.2 pg (ref 26.0–34.0)
MCHC: 33.2 g/dL (ref 30.0–36.0)
MCV: 84.7 fL (ref 80.0–100.0)
Monocytes Absolute: 0.4 10*3/uL (ref 0.1–1.0)
Monocytes Relative: 3 %
Neutro Abs: 12.4 10*3/uL — ABNORMAL HIGH (ref 1.7–7.7)
Neutrophils Relative %: 91 %
Platelets: 237 10*3/uL (ref 150–400)
RBC: 3.8 MIL/uL — ABNORMAL LOW (ref 3.87–5.11)
RDW: 13.2 % (ref 11.5–15.5)
WBC: 13.5 10*3/uL — ABNORMAL HIGH (ref 4.0–10.5)
nRBC: 0 % (ref 0.0–0.2)

## 2023-05-31 LAB — BASIC METABOLIC PANEL
Anion gap: 12 (ref 5–15)
Anion gap: 10 (ref 5–15)
BUN: 43 mg/dL — ABNORMAL HIGH (ref 8–23)
BUN: 41 mg/dL — ABNORMAL HIGH (ref 8–23)
CO2: 19 mmol/L — ABNORMAL LOW (ref 22–32)
CO2: 23 mmol/L (ref 22–32)
Calcium: 8.9 mg/dL (ref 8.9–10.3)
Calcium: 9 mg/dL (ref 8.9–10.3)
Chloride: 97 mmol/L — ABNORMAL LOW (ref 98–111)
Chloride: 96 mmol/L — ABNORMAL LOW (ref 98–111)
Creatinine, Ser: 2.24 mg/dL — ABNORMAL HIGH (ref 0.44–1.00)
Creatinine, Ser: 1.56 mg/dL — ABNORMAL HIGH (ref 0.44–1.00)
GFR, Estimated: 23 mL/min — ABNORMAL LOW (ref >60)
GFR, Estimated: 36 mL/min — ABNORMAL LOW (ref >60)
Glucose, Bld: 118 mg/dL — ABNORMAL HIGH (ref 70–99)
Glucose, Bld: 118 mg/dL — ABNORMAL HIGH (ref 70–99)
Potassium: 4.1 mmol/L (ref 3.5–5.1)
Potassium: 4.1 mmol/L (ref 3.5–5.1)
Sodium: 128 mmol/L — ABNORMAL LOW (ref 135–145)
Sodium: 129 mmol/L — ABNORMAL LOW (ref 135–145)

## 2023-05-31 LAB — TYPE AND SCREEN
Antibody Screen: NEGATIVE
Unit division: 0

## 2023-05-31 LAB — BPAM RBC
Blood Product Expiration Date: 202408252359
Blood Product Expiration Date: 202408252359
ISSUE DATE / TIME: 202407231221
Unit Type and Rh: 5100
Unit Type and Rh: 5100

## 2023-05-31 LAB — LIPOPROTEIN A (LPA): Lipoprotein (a): <8.4 nmol/L

## 2023-05-31 SURGERY — IMAGING PROCEDURE, GI TRACT, INTRALUMINAL, VIA CAPSULE
Anesthesia: LOCAL

## 2023-05-31 MED ORDER — OXYCODONE HCL 5 MG PO TABS
5.0000 mg | ORAL_TABLET | ORAL | Status: DC | PRN
  Administered 2023-05-31 – 2023-06-04 (×8): 5 mg via ORAL
  Filled 2023-05-31 (×8): qty 1

## 2023-05-31 MED ORDER — ORAL CARE MOUTH RINSE: 15.0000 mL | OROMUCOSAL | Status: DC | PRN

## 2023-05-31 MED ORDER — IRON SUCROSE 500 MG IVPB - SIMPLE MED
500.0000 mg | Freq: Once | INTRAVENOUS | Status: AC
  Administered 2023-05-31: 500 mg via INTRAVENOUS
  Filled 2023-05-31: qty 275

## 2023-05-31 MED ORDER — METOPROLOL TARTRATE 5 MG/5ML IV SOLN
5.0000 mg | Freq: Once | INTRAVENOUS | Status: AC
  Administered 2023-05-31: 5 mg via INTRAVENOUS
  Filled 2023-05-31: qty 5

## 2023-05-31 MED ORDER — NITROGLYCERIN IN D5W 200-5 MCG/ML-% IV SOLN
0.0000 ug/min | INTRAVENOUS | Status: DC
  Administered 2023-05-31: 5 ug/min via INTRAVENOUS
  Administered 2023-06-01: 65 ug/min via INTRAVENOUS
  Filled 2023-05-31 (×3): qty 250

## 2023-05-31 MED ORDER — SODIUM CHLORIDE 0.9 % IV SOLN: 510.0000 mg | Freq: Once | INTRAVENOUS | Status: DC

## 2023-05-31 MED ORDER — PANTOPRAZOLE SODIUM 40 MG PO TBEC
40.0000 mg | DELAYED_RELEASE_TABLET | Freq: Every day | ORAL | Status: DC
  Administered 2023-06-01 – 2023-06-04 (×4): 40 mg via ORAL
  Filled 2023-05-31 (×4): qty 1

## 2023-05-31 MED ORDER — DEXTROSE 5 % IV SOLN
500.0000 mg | Freq: Once | INTRAVENOUS | Status: AC
  Administered 2023-05-31: 500 mg via INTRAVENOUS
  Filled 2023-05-31: qty 17.8

## 2023-05-31 MED ORDER — FUROSEMIDE 10 MG/ML IJ SOLN
60.0000 mg | Freq: Once | INTRAMUSCULAR | Status: AC
  Administered 2023-05-31: 60 mg via INTRAVENOUS
  Filled 2023-05-31: qty 6

## 2023-05-31 SURGICAL SUPPLY — 1 items: TOWEL COTTON PACK 4EA (MISCELLANEOUS) ×2 IMPLANT

## 2023-05-31 NOTE — Assessment & Plan Note (Addendum)
Exacerbation has improved. Continue with bronchodilator therapy, off  systemic corticosteroids.   Mediastinal lymphadenopathy to follow up as outpatient.   Tobacco abuse, smoking cessation counseling.

## 2023-05-31 NOTE — Care Management Important Message (Signed)
Important Message  Patient Details  Name: Gwendolyn [name] MRN: [id number] Date of Birth: 07-Dec-1955   Medicare Important Message Given:  Yes     Renie Ora 05/31/2023, 9:17 AM

## 2023-05-31 NOTE — Progress Notes (Signed)
Progress Note   Patient: Gwendolyn [name] VHQ:[id number] DOB: 10-Feb-1956 DOA: 05/28/2023     3 DOS: the patient was seen and examined on 05/31/2023   Brief hospital course: [name] was admitted to the hospital with the working diagnosis of heart failure exacerbation.   67 yo female with the past medical history of hypertension, hypothyroidism, supra renal abdominal aortic stenosis, anemia and tobacco abuse who presented with dyspnea. Recent COVID 19 infection about 3 weeks ago, treated with azithromycin, and then followed by a course of Augmentin. She endorsed new orthopnea and dyspnea, prompting her to call EMS. On her initial physical examination her blood pressure was 220/96, HR 88,  RR 23 and 02 saturation 100%, she had rales and decreased breath sounds, heart with S1 and S2 present and rhythmic, abdomen with no distention and no lower extremity edema.   Chest radiograph with cardiomegaly, bilateral hilar vascular congestion, small bilateral pleural effusions.   CT chest with bilateral small pleural effusions.  No evidence of aortic dissection or aneurysm.  Stable severe atherosclerotic disease of the abdominal aorta resulting in severe stenosis of the proximal aorta at the level of the celiac axis, superior mesenteric artery and renal arteries.  Moderate severe focal stenosis of the origins of the left subclavian and left common carotid arteries.  Moderate severe focal stenosis of the origin of the celiac artery.  Moderate severe focal stenosis of the origin of the right renal artery.   EKG 92 bpm, normal axis, normal intervals, sinus rhythm, q wave V1 and V2, no significant ST segment changes, negative T wave V5 and V6.  Patient has been placed on furosemide for diuresis.   Assessment and Plan: * Acute on chronic diastolic CHF (congestive heart failure) (HCC) Echocardiogram with preserved LV systolic function with EF 50 to 55%. Moderate LVH. RV systolic function preserved. LA  with severe dilatation. Moderate aortic stenosis. Trivial pericardial effusion.   Abdominal aortic stenosis.   Urine output is 2,900 ml Systolic blood pressure 190, 200, 175 mmHg.  Continue hydralazine and isosorbide.  Amlodipine for blood pressure control. Holding RAAS inhibition until blood pressure more stable.  On SGLT 2 inh.  Holding diuretic loop therapy for now.   Symptomatic anemia Iron deficiency anemia.  Serum iron 17, TIBC 383, transferrin saturation 4 and ferritin 10.   SP 2 units PRBC transfusion.  Add IV iron infusion today.  Follow up on capsule endoscopy and further GI recommendations.   Hypertensive urgency Blood pressure continue elevated. Continue with hydralazine, isosorbide, labetalol and amlodipine.   AKI (acute kidney injury) (HCC) Hyponatremia.  Right renal artery stenosis.   Renal function with serum cr at 2,24 with K at 4,1 and serum bicarbonate at 19. Na 128, Plan to continue holding ARB and loop diuretic for now.  Follow up renal function and electrolytes in am.   COPD (chronic obstructive pulmonary disease) (HCC) Exacerbation has improved. Continue with bronchodilator therapy, discontinue systemic corticosteroids.   Hypothyroidism Continue levothyroxine.   Hyperlipidemia Continue statin.         Subjective: Patient with improvement in her dyspnea but not yet back to baseline.   Physical Exam: Vitals:   05/31/23 0750 05/31/23 0805 05/31/23 1020 05/31/23 1100  BP:  (!) 210/53 (!) 164/57 (!) 175/59  Pulse: 77 80 75 75  Resp:  20 20 20   Temp:  98.2 F (36.8 C)    TempSrc:  Oral    SpO2:  95% 92% 92%  Weight:  Height:       Neurology awake and alert ENT with mild pallor Cardiovascular with S1 and S2 present and regular, positive systolic murmur at the base, no gallops,. No JVD No lower extremity edema Respiratory with scattered rales with no wheezing or rhonchi] Abdomen with no distention  Data Reviewed:    Family  Communication: no family at the bedside   Disposition: Status is: Inpatient Remains inpatient appropriate because: recovering heart failure, GI work up   Planned Discharge Destination: Home     Author: Coralie Keens, MD 05/31/2023 11:48 AM  For on call review [email or web address].

## 2023-05-31 NOTE — Assessment & Plan Note (Addendum)
Echocardiogram with preserved LV systolic function with EF 50 to 55%. Moderate LVH. RV systolic function preserved. LA with severe dilatation. Moderate aortic stenosis. Trivial pericardial effusion.   Abdominal aortic stenosis.   Urine output is 2,300 ml Systolic blood pressure has been up to 200's mmHg.   Continue hydralazine 100 mg tid, last night placed back on nitroglycerin drip.  Continue with Amlodipine and labetalol.  Continue with SGLT 2 inh and add spironolactone.  Loop diuretic with furosemide 40 mg po daily.

## 2023-05-31 NOTE — Assessment & Plan Note (Addendum)
Continue statin.

## 2023-05-31 NOTE — Progress Notes (Signed)
Pt ingested capsule 0915- well tolerated. Given instructions on diet. RN at bedside.

## 2023-05-31 NOTE — Hospital Course (Addendum)
[name] was admitted to the hospital with the working diagnosis of heart failure exacerbation.   67 yo female with the past medical history of hypertension, hypothyroidism, supra renal abdominal aortic stenosis, anemia and tobacco abuse who presented with dyspnea. Recent COVID 19 infection about 3 weeks ago, treated with azithromycin, and then followed by a course of Augmentin. She endorsed new orthopnea and dyspnea, prompting her to call EMS. On her initial physical examination her blood pressure was 220/96, HR 88,  RR 23 and 02 saturation 100%, she had rales and decreased breath sounds, heart with S1 and S2 present and rhythmic, abdomen with no distention and no lower extremity edema.   Chest radiograph with cardiomegaly, bilateral hilar vascular congestion, small bilateral pleural effusions.   CT chest with bilateral small pleural effusions.  No evidence of aortic dissection or aneurysm.  Stable severe atherosclerotic disease of the abdominal aorta resulting in severe stenosis of the proximal aorta at the level of the celiac axis, superior mesenteric artery and renal arteries.  Moderate severe focal stenosis of the origins of the left subclavian and left common carotid arteries.  Moderate severe focal stenosis of the origin of the celiac artery.  Moderate severe focal stenosis of the origin of the right renal artery.  Mediastinal lymphadenopathy of uncertain etiology.   EKG 92 bpm, normal axis, normal intervals, sinus rhythm, q wave V1 and V2, no significant ST segment changes, negative T wave V5 and V6.  Patient has been placed on furosemide for diuresis.  Capsule endoscopy for occult GI bleed.  07/24 difficult to control blood pressure, required nitroglycerin drip.  07/25 capsule endoscopy with positive colonic bleeding, plan for colonoscopy today.  07/26 no further sings of bleeding, colonoscopy negative.  Further titration of blood pressure regimen.  07/28 blood pressure with  better controlled, on close to maximal medical therapy. Plan to follow up as outpatient.

## 2023-05-31 NOTE — Assessment & Plan Note (Addendum)
Acute blood loss anemia.  Iron deficiency anemia.  Serum iron 17, TIBC 383, transferrin saturation 4 and ferritin 10.   Today Hgb stable at 10,8   SP 2 units PRBC transfusion.  IV iron sucrose infusion, for second dose today. Follow up with colonoscopy.

## 2023-05-31 NOTE — Plan of Care (Signed)
°  Problem: Education: Goal: Ability to demonstrate management of disease process will improve Outcome: Progressing   Problem: Education: Goal: Ability to verbalize understanding of medication therapies will improve Outcome: Progressing   Problem: Education: Goal: Individualized Educational Video(s) Outcome: Progressing

## 2023-05-31 NOTE — Assessment & Plan Note (Addendum)
Hyponatremia. Hypokalemia.  Right renal artery stenosis.   Volume status has improved. Renal function with serum cr at 1,0 with K at 3,3 and serum bicarbonate at 27, Na 131.   Plan to resume furosemide 40 mg po daily.  Continue with SGLT 2 inh and add spironolactone.   Add Kcl for repletion, total of 80 meq today.  Follow up renal function in am.

## 2023-05-31 NOTE — Assessment & Plan Note (Signed)
Continue levothyroxine 

## 2023-05-31 NOTE — Assessment & Plan Note (Addendum)
Continue with hydralazine, labetalol and amlodipine.  Back on nitroglycerin drip for blood pressure control.  Will add spironolactone.  For her renal artery stenosis she will benefit from ARB or ACE inh, when renal function more stable, she may able to transition to entresto.

## 2023-05-31 NOTE — Progress Notes (Signed)
Progress Note  Patient Name: Gwendolyn [name] MRN: [id number] DOB: August 22, 1956 Date of Encounter: 05/31/2023  Attending physician: Coralie Keens Primary care provider: Ralene Ok, MD Primary Cardiologist: Dr. Jacinto Halim  Subjective: Gwendolyn [name] is a 67 y.o. Caucasian female who was seen and examined at bedside  With daughter and grandson at bedside. Blood pressure and shortness of breath worsened after blood transfusions yesterday night/early morning. Received IV Lasix. Shortness of breath is improving  Objective: Vital Signs in the last 24 hours: Temp:  [98.1 F (36.7 C)-98.9 F (37.2 C)] 98.2 F (36.8 C) (07/24 0805) Pulse Rate:  [70-85] 75 (07/24 1331) Resp:  [13-22] 18 (07/24 1331) BP: (159-210)/(48-70) 178/56 (07/24 1331) SpO2:  [90 %-97 %] 97 % (07/24 1331) FiO2 (%):  [28 %] 28 % (07/24 0750) Weight:  [68.2 kg] 68.2 kg (07/24 0544)  Intake/Output:  Intake/Output Summary (Last 24 hours) at 05/31/2023 1409 Last data filed at 05/31/2023 1321 Gross per 24 hour  Intake 1397.37 ml  Output 3300 ml  Net -1902.63 ml    Net IO Since Admission: -2,899.84 mL [05/31/23 1409]  Weights:     05/31/2023    5:44 AM 05/30/2023    5:14 AM 05/29/2023    4:00 AM  Last 3 Weights  Weight (lbs) 150 lb 4.8 oz 151 lb 1.6 oz 154 lb 15.7 oz  Weight (kg) 68.176 kg 68.539 kg 70.3 kg      Telemetry:  Overnight telemetry shows sinus, which I personally reviewed.   Physical examination: PHYSICAL EXAM: Vitals:   05/31/23 1100 05/31/23 1200 05/31/23 1300 05/31/23 1331  BP: (!) 175/59 (!) 170/51 (!) 184/58 (!) 178/56  Pulse: 75  80 75  Resp: 20 20 20 18   Temp:      TempSrc:      SpO2: 92%  93% 97%  Weight:      Height:        Physical Exam  Constitutional: No distress.  Age appropriate, hemodynamically stable.   Neck: No JVD present.  Cardiovascular: Normal rate, regular rhythm, S1 normal, S2 normal, intact distal pulses and normal pulses. Exam reveals no gallop,  no S3 and no S4.  Murmur heard. Midsystolic murmur is present with a grade of 3/6 at the upper right sternal border radiating to the neck. Decrescendo diastolic murmur is present with a grade of 3/4 at the upper right sternal border radiating to the apex. Pulses:      Carotid pulses are  on the right side with bruit and  on the left side with bruit. Pulmonary/Chest: Effort normal. No stridor. She has no rales. She has scattered wheezes.  Abdominal: Soft. Bowel sounds are normal. She exhibits no distension. There is no abdominal tenderness.  Musculoskeletal:        General: No edema.     Cervical back: Neck supple.  Neurological: She is alert and oriented to person, place, and time. She has intact cranial nerves (2-12).  Skin: Skin is warm and moist.     Lab Results: Chemistry Recent Labs  Lab 05/28/23 1240 05/29/23 0031 05/30/23 0134 05/30/23 0815 05/30/23 2359  NA 131*   < > 131* 128* 128*  K 3.5   < > 3.7 3.7 4.1  CL 102   < > 96* 95* 97*  CO2 22   < > 23 23 19*  GLUCOSE 111*   < > 132* 113* 118*  BUN 12   < > 28* 34* 43*  CREATININE 0.78   < >  1.93* 2.17* 2.24*  CALCIUM 8.7*   < > 8.9 8.8* 8.9  PROT 6.6  --   --   --   --   ALBUMIN 4.0  --   --   --   --   AST 15  --   --   --   --   ALT 14  --   --   --   --   ALKPHOS 54  --   --   --   --   BILITOT 0.4  --   --   --   --   GFRNONAA >60   < > 28* 24* 23*  ANIONGAP 7   < > 12 10 12    < > = values in this interval not displayed.    Hematology Recent Labs  Lab 05/30/23 0134 05/30/23 0815 05/30/23 2100 05/30/23 2359  WBC 10.3 12.5*  --  13.5*  RBC 2.56* 2.57*  --  3.80*  HGB 7.1* 7.1* 11.2* 10.7*  HCT 22.2* 22.3* 33.3* 32.2*  MCV 86.7 86.8  --  84.7  MCH 27.7 27.6  --  28.2  MCHC 32.0 31.8  --  33.2  RDW 14.0 13.8  --  13.2  PLT 217 204  --  237   High Sensitivity Troponin:   Recent Labs  Lab 05/28/23 1420  TROPONINIHS 16     Cardiac EnzymesNo results for input(s): "TROPONINI" in the last 168 hours. No  results for input(s): "TROPIPOC" in the last 168 hours.  BNP Recent Labs  Lab 05/28/23 1420 05/29/23 0031 05/30/23 0815  BNP 1,292.5* 1,993.0* 1,252.8*    DDimer No results for input(s): "DDIMER" in the last 168 hours.  Hemoglobin A1c: No results found for: "HGBA1C", "MPG" TSH  Recent Labs    05/28/23 1240  TSH 1.427   Lipid Panel  Lab Results  Component Value Date   CHOL 165 05/30/2023   HDL 27 (L) 05/30/2023   LDLCALC 122 (H) 05/30/2023   LDLDIRECT 118 (H) 05/30/2023   TRIG 82 05/30/2023   CHOLHDL 6.1 05/30/2023   Drugs of Abuse  No results found for: "LABOPIA", "COCAINSCRNUR", "LABBENZ", "AMPHETMU", "THCU", "LABBARB"    Imaging: DG CHEST PORT 1 VIEW  Result Date: 05/31/2023 CLINICAL DATA:  Shortness of breath. EXAM: PORTABLE CHEST 1 VIEW COMPARISON:  May 28, 2023. FINDINGS: Stable cardiomegaly. Bilateral pulmonary edema is noted with bilateral pleural effusions which are mildly enlarged. Bony thorax unremarkable. IMPRESSION: Bilateral pulmonary edema and small bilateral pleural effusions which are increased compared to prior exam. Electronically Signed   By: Lupita Raider M.D.   On: 05/31/2023 08:32    CARDIAC DATABASE: EKG: May 28, 2023: Sinus rhythm, 92 bpm, ST-T changes in the lateral leads consider LVH with repolarization abnormality but ischemia cannot be ruled out.    Echocardiogram: 02/03/2022:  1. Left ventricular ejection fraction, by estimation, is 50 to 55%. The  left ventricle has low normal function. The left ventricle has no regional  wall motion abnormalities. There is mild left ventricular hypertrophy.  Left ventricular diastolic  parameters are consistent with Grade II diastolic dysfunction  (pseudonormalization).   2. Right ventricular systolic function is normal. The right ventricular  size is normal.   3. Left atrial size was severely dilated.   4. The mitral valve is normal in structure. Mild mitral valve  regurgitation. No evidence of  mitral stenosis.   5. The aortic valve is tricuspid. Aortic valve regurgitation is moderate.   6. The  inferior vena cava is normal in size with <50% respiratory  variability, suggesting right atrial pressure of 8 mmHg.   Comparison(s): A prior study was performed on 09/09/2022. No significant  change.   05/29/2023:  1. Left ventricular ejection fraction, by estimation, is 50 to 55%. The  left ventricle has low normal function. The left ventricle has no regional  wall motion abnormalities. There is moderate left ventricular hypertrophy.  Left ventricular diastolic  parameters are consistent with Grade II diastolic dysfunction  (pseudonormalization). Elevated left atrial pressure.   2. Right ventricular systolic function is normal. The right ventricular  size is normal. Tricuspid regurgitation signal is inadequate for assessing  PA pressure.   3. Left atrial size was severely dilated.   4. The mitral valve is normal in structure. No evidence of mitral valve  regurgitation.   5. The aortic valve was not well visualized. Aortic valve regurgitation  is mild. Moderate aortic valve stenosis. Vmax 3.0 m/s, MG , AVA 1.2  cm^2, DI 0.43   6. The inferior vena cava is dilated in size with <50% respiratory  variability, suggesting right atrial pressure of 15 mmHg.    Stress Testing:  Exercise nuclear stress test 03/28/2022: Normal ECG stress. The patient exercised for 2 minutes and 59 seconds of a Bruce protocol, achieving approximately 4.64 METs and achieved 88% MPHR. Stress terminated due to fatigue.  The heart rate response was accelerated. Exercise capacity was severely reduced.   The baseline blood pressure was 200/70 mmHg and increased to 230/76 mmHg, which is a normal response to exercise. Myocardial perfusion is normal. The LV is mildly dilated in both rest and stress. Overall LV systolic function is mildly abnormal without regional wall motion abnormalities. Stress LV EF: 43%.  No  previous exam available for comparison. Intermediate risk. Findings may suggest non ischemic cardiomyopathy.    Abdominal arteriogram and selective left renal arteriogram and lithotripsy balloon angioplasty of the suprarenal abdominal aorta using shockwave M5 catheter 02/03/2022: Abdominal aorta: 2 renal arteries 1 on either sides, mild atherosclerotic changes are present.  Abdominal aorta is diffusely diseased and calcific.  There is a high-grade 90% to 99% stenosis of the suprarenal abdominal aorta with heavy calcification. Intervention data: Successful shockwave lithotripsy using a 8.0 x 60 mm shockwave M5 balloon, gradual progression of balloon inflation pressure from 4 atmospheric pressure already up to 10 atm pressure and using a 8.43 mm lumen, followed by balloon angioplasty with a 4.0 x 60 mm Mustang balloon at 4 atmospheric pressure. Stenosis reduced from 95% to probably insignificant stenosis (<50%).  Angiogram not performed to conserve contrast and would have been poorly visualized with CO2.   Pressure gradient pre and post:  Opening right common femoral artery pressure 52/44 mmHg.  Suprarenal thoracic aortic pressure 180/58 mmHg with a mean of 103 mmHg.  Pressure gradient of approximately 130 mmHg.  Closing right femoral artery pressure 121/62 mmHg.  Pressure gradient approximately 30 mmHg post angioplasty.  5-10 mL contrast utilized 20 to visualize abdominal aorta.  Right femoral arterial access closed with Perclose.  Recommendation: Start the patient on Plavix, I expect improvement in renal function, do not treat hypertension unless blood pressure >160 mmHg.  We will change all the antihypertensive medications.  Scheduled Meds:  amLODipine  10 mg Oral Daily   arformoterol  15 mcg Nebulization BID   budesonide (PULMICORT) nebulizer solution  0.25 mg Nebulization BID   cyanocobalamin  1,000 mcg Subcutaneous Q2000   dapagliflozin propanediol  10 mg Oral  Daily   ezetimibe  10 mg Oral  Daily   hydrALAZINE  100 mg Oral TID   ipratropium-albuterol  3 mL Nebulization BID   isosorbide mononitrate  60 mg Oral Daily   labetalol  400 mg Oral BID   levothyroxine  100 mcg Oral Q0600   nicotine  21 mg Transdermal Daily   pantoprazole  40 mg Oral Daily   polyethylene glycol  17 g Oral Daily   rosuvastatin  40 mg Oral Daily   senna-docusate  1 tablet Oral QHS   sodium chloride flush  3 mL Intravenous Q12H    Continuous Infusions:  iron sucrose 500 mg (05/31/23 1301)   nitroGLYCERIN 15 mcg/min (05/31/23 1400)    PRN Meds: acetaminophen **OR** acetaminophen, albuterol, chlorpheniramine-HYDROcodone, hydrALAZINE, ondansetron (ZOFRAN) IV, mouth rinse, oxyCODONE   IMPRESSION & RECOMMENDATIONS: NYLA CREASON is a 67 y.o. Caucasian female whose past medical history and cardiac risk factors include:  resistant hypertension, mixed hyperlipidemia, nicotine dependence, COPD, known distal aorta stenosis (s/p successful Abdominal arteriogram and selective left renal arteriogram and lithotripsy balloon angioplasty of the suprarenal abdominal aorta using shockwave M5 catheter 02/03/2022), chronic HFpEF, hx of hypertensive emergency.   Impression: Hypertensive emergency on admission Resistant hypertension Acute kidney injury Acute on chronic HFpEF, stage C, NYHA class III. Aortic stenosis, moderate Hyperlipidemia, pure Acute COPD exacerbation. Recent COVID-19 infection. Cigarette smoking. Hyperlipidemia. Symptomatic anemia status post blood transfusion Abdominal aortic stenosis status post angioplasty Renal artery stenosis Left subclavian and common carotid artery stenosis per imaging Moderate/severe celiac artery stenosis per imaging Mediastinal lymphadenopathy  Recommendations: Hypertensive emergency on admission: Resistant hypertension at baseline. Renal artery stenosis per imaging Blood pressures were improving but s/p PRBCs started having dyspnea and elevated BP. Will  restart nitro drip. Hold ARB in the setting of renal artery stenosis and AKI. Medications reconciled. Monitor BUN and creatinine.  AKI:  Likely secondary to correcting her BP Monitor BUN and Cr.  D/c ARB Monitor for now.   Acute on chronic HFpEF, stage C, NYHA class III Improving. BNP downtrending. Did not tolerate the transition from lisinopril to losartan -likely secondary to renal artery stenosis. Continue Farxiga, hydralazine/Imdur, labetalol May consider initiating spironolactone as outpatient -will hold off now given AKI. Net IO Since Admission: -2,899.84 mL [05/31/23 1409] Will need close outpatient follow-up.  Moderate aortic stenosis: Monitor for now. Need longitudinal follow-up as outpatient  Acute COPD exacerbation: Recent COVID-19 infection. Cigarette smoking. COPD and post-COVID management per primary team. Reemphasized importance of complete smoking cessation in the setting of underlying COPD and extensive vascular disease. 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 Most recent lipid profile reviewed. LDL still not at goal despite being on high intensity statin therapy. Start Zetia 10 mg p.o. daily. Consider PCSK9 inhibitors as outpatient given the degree of vascular disease. LP(a) still is pending  Symptomatic anemia status post PRBCs.   Undergoing ischemic workup with primary and GI. Scheduled for capsule endoscopy later today.  Abdominal aortic stenosis status post angioplasty Renal artery stenosis Left subclavian and common carotid artery stenosis per imaging Moderate/severe celiac artery stenosis per imaging After acute hospitalization recommend reestablishing care with Dr. Jacinto Halim to discuss long-term management. Clinically she has not lost any significant weight and does not have postprandial symptoms.   She also does not complain of pain with overhead  activities.   Blood pressure management as discussed above Aspirin held off secondary to  anemia. Lipid management as discussed above Avoid over correcting her blood pressures /hypotension otherwise may become symptomatic given the degree of stenosis in her vascular tree.  Mediastinal lymphadenopathy: Defer to primary team.  Noted on CTA of the chest abdomen pelvis.  Would recommend further workup given her prolonged history of smoking.  Patient's questions and concerns were addressed to her satisfaction. She voices understanding of the instructions provided during this encounter.   This note was created using a voice recognition software as a result there may be grammatical errors inadvertently enclosed that do not reflect the nature of this encounter. Every attempt is made to correct such errors.  Delilah Shan [facility]  Pager:  [phone] Office: [phone] 05/31/2023, 2:09 PM

## 2023-06-01 ENCOUNTER — Encounter (HOSPITAL_COMMUNITY): Payer: Self-pay | Admitting: Gastroenterology

## 2023-06-01 DIAGNOSIS — N179 Acute kidney failure, unspecified: Secondary | ICD-10-CM | POA: Diagnosis not present

## 2023-06-01 DIAGNOSIS — D649 Anemia, unspecified: Secondary | ICD-10-CM | POA: Diagnosis not present

## 2023-06-01 DIAGNOSIS — I160 Hypertensive urgency: Secondary | ICD-10-CM | POA: Diagnosis not present

## 2023-06-01 DIAGNOSIS — I5033 Acute on chronic diastolic (congestive) heart failure: Secondary | ICD-10-CM | POA: Diagnosis not present

## 2023-06-01 DIAGNOSIS — J441 Chronic obstructive pulmonary disease with (acute) exacerbation: Secondary | ICD-10-CM

## 2023-06-01 LAB — CBC
HCT: 30.4 % — ABNORMAL LOW (ref 36.0–46.0)
Hemoglobin: 10.2 g/dL — ABNORMAL LOW (ref 12.0–15.0)
MCH: 28.1 pg (ref 26.0–34.0)
MCHC: 33.6 g/dL (ref 30.0–36.0)
MCV: 83.7 fL (ref 80.0–100.0)
Platelets: 241 10*3/uL (ref 150–400)
RBC: 3.63 MIL/uL — ABNORMAL LOW (ref 3.87–5.11)
RDW: 13.4 % (ref 11.5–15.5)
WBC: 12.5 10*3/uL — ABNORMAL HIGH (ref 4.0–10.5)
nRBC: 0 % (ref 0.0–0.2)

## 2023-06-01 LAB — BASIC METABOLIC PANEL
Anion gap: 10 (ref 5–15)
CO2: 26 mmol/L (ref 22–32)
Calcium: 8.7 mg/dL — ABNORMAL LOW (ref 8.9–10.3)
Chloride: 95 mmol/L — ABNORMAL LOW (ref 98–111)
Creatinine, Ser: 1.28 mg/dL — ABNORMAL HIGH (ref 0.44–1.00)
GFR, Estimated: 46 mL/min — ABNORMAL LOW (ref >60)
Potassium: 3.4 mmol/L — ABNORMAL LOW (ref 3.5–5.1)
Sodium: 131 mmol/L — ABNORMAL LOW (ref 135–145)

## 2023-06-01 MED ORDER — POTASSIUM CHLORIDE 20 MEQ PO PACK
20.0000 meq | PACK | Freq: Once | ORAL | Status: AC
  Administered 2023-06-01: 20 meq via ORAL
  Filled 2023-06-01: qty 1

## 2023-06-01 MED ORDER — POTASSIUM CHLORIDE CRYS ER 20 MEQ PO TBCR: 20.0000 meq | EXTENDED_RELEASE_TABLET | Freq: Once | ORAL | Status: DC

## 2023-06-01 MED ORDER — ISOSORBIDE MONONITRATE ER 60 MG PO TB24
120.0000 mg | ORAL_TABLET | Freq: Every day | ORAL | Status: DC
  Administered 2023-06-01: 120 mg via ORAL
  Filled 2023-06-01: qty 2

## 2023-06-01 MED ORDER — LABETALOL HCL 200 MG PO TABS
400.0000 mg | ORAL_TABLET | Freq: Two times a day (BID) | ORAL | Status: DC
  Administered 2023-06-01 – 2023-06-03 (×4): 400 mg via ORAL
  Filled 2023-06-01 (×5): qty 2

## 2023-06-01 MED ORDER — FUROSEMIDE 40 MG PO TABS
40.0000 mg | ORAL_TABLET | Freq: Every day | ORAL | Status: DC
  Administered 2023-06-01 – 2023-06-04 (×4): 40 mg via ORAL
  Filled 2023-06-01 (×4): qty 1

## 2023-06-01 MED ORDER — PEG 3350-KCL-NA BICARB-NACL 420 G PO SOLR
4000.0000 mL | Freq: Once | ORAL | Status: AC
  Administered 2023-06-01: 4000 mL via ORAL
  Filled 2023-06-01: qty 4000

## 2023-06-01 MED ORDER — ALPRAZOLAM 0.5 MG PO TABS
0.5000 mg | ORAL_TABLET | Freq: Three times a day (TID) | ORAL | Status: DC | PRN
  Administered 2023-06-01 – 2023-06-03 (×2): 0.5 mg via ORAL
  Filled 2023-06-01 (×3): qty 1

## 2023-06-01 MED ORDER — LABETALOL HCL 100 MG PO TABS
100.0000 mg | ORAL_TABLET | Freq: Once | ORAL | Status: AC
  Administered 2023-06-01: 100 mg via ORAL
  Filled 2023-06-01: qty 1

## 2023-06-01 MED ORDER — LABETALOL HCL 200 MG PO TABS
300.0000 mg | ORAL_TABLET | Freq: Three times a day (TID) | ORAL | Status: DC
  Administered 2023-06-01: 300 mg via ORAL
  Filled 2023-06-01: qty 1

## 2023-06-01 NOTE — Plan of Care (Signed)
Pt reports feeling much better than last night. Pt ambulating in room and getting up to [facility] independently without shortness of breath.  Systolic BP in 200s at start of shift but improved when nitroglycerin drip titrated up to 72mcg/min and after scheduled PO hydralazine and labetalol.  Pt diuresing well after lasix. Still needs to have BM, miralax and senakot given.   Problem: Education: Goal: Ability to demonstrate management of disease process will improve Outcome: Progressing Goal: Ability to verbalize understanding of medication therapies will improve Outcome: Progressing Goal: Individualized Educational Video(s) Outcome: Progressing   Problem: Activity: Goal: Capacity to carry out activities will improve Outcome: Progressing   Problem: Cardiac: Goal: Ability to achieve and maintain adequate cardiopulmonary perfusion will improve Outcome: Progressing   Problem: Clinical Measurements: Goal: Ability to maintain clinical measurements within normal limits will improve Outcome: Progressing Goal: Will remain free from infection Outcome: Progressing Goal: Diagnostic test results will improve Outcome: Progressing Goal: Respiratory complications will improve Outcome: Progressing Goal: Cardiovascular complication will be avoided Outcome: Progressing

## 2023-06-01 NOTE — Evaluation (Signed)
Physical Therapy Evaluation Patient Details Name: Gwendolyn [name] MRN: [id number] DOB: Jun 10, 1956 Today's Date: 06/01/2023  History of Present Illness  67 yo female presents to [facility] on 7/24 with HF exacerbation. Recent covid infection. PMH includes: HTN, HLD, current tobacco use, severe aortic stenosis, COPD, HFpEF, and asthma.  Clinical Impression   Pt presents with generalized weakness, dyspnea at rest and on exertion requiring 3-4LO2, impaired activity tolerance. Pt to benefit from acute PT to address deficits. Pt tolerated dangling on EOB only this session, given dyspnea and SBP 226 with this. Pt is anxious, states she gets more anxious when not able to smoke cigarettes and when she having difficulty breathing, RN called to room. PT to progress mobility as tolerated, and will continue to follow acutely.          Assistance Recommended at Discharge PRN  If plan is discharge home, recommend the following:  Can travel by private vehicle  A little help with walking and/or transfers;A little help with bathing/dressing/bathroom        Equipment Recommendations None recommended by PT  Recommendations for Other Services       Functional Status Assessment Patient has had a recent decline in their functional status and demonstrates the ability to make significant improvements in function in a reasonable and predictable amount of time.     Precautions / Restrictions Precautions Precautions: Fall Restrictions Weight Bearing Restrictions: No      Mobility  Bed Mobility Overal bed mobility: Needs Assistance Bed Mobility: Supine to Sit     Supine to sit: Supervision, HOB elevated     General bed mobility comments: increased time    Transfers                   General transfer comment: nt - pt dyspnea, SBP 226    Ambulation/Gait                  Stairs            Wheelchair Mobility     Tilt Bed    Modified Rankin (Stroke Patients Only)        Balance Overall balance assessment: Mild deficits observed, not formally tested   Sitting balance-Leahy Scale: Good                                       Pertinent Vitals/Pain Pain Assessment Pain Assessment: Faces Faces Pain Scale: Hurts little more Pain Location: breathing Pain Descriptors / Indicators: Discomfort Pain Intervention(s): Limited activity within patient's tolerance, Monitored during session, Repositioned    Home Living Family/patient expects to be discharged to:: Private residence Living Arrangements: Children (son) Available Help at Discharge: Family Type of Home: House Home Access: Stairs to enter   Secretary/administrator of Steps: 2   Home Layout: One level Home Equipment: None      Prior Function Prior Level of Function : Independent/Modified Independent;Working/employed;Driving               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     Hand Dominance   Dominant Hand: Right    Extremity/Trunk Assessment   Upper Extremity Assessment Upper Extremity Assessment: Defer to OT evaluation    Lower Extremity Assessment Lower Extremity Assessment: Generalized weakness    Cervical / Trunk Assessment Cervical / Trunk Assessment: Kyphotic  Communication   Communication: No difficulties  Cognition Arousal/Alertness: Awake/alert Behavior During  Therapy: WFL for tasks assessed/performed, Anxious Overall Cognitive Status: Within Functional Limits for tasks assessed                                          General Comments General comments (skin integrity, edema, etc.): 3LO2 upon arrival to room satting 88%, placed on 4LO2 given work of breathing with recovery to 93%. Cues for pursed lip breathing, but difficult for pt to implement    Exercises     Assessment/Plan    PT Assessment Patient needs continued PT services  PT Problem List Decreased strength;Decreased mobility;Decreased activity  tolerance;Decreased balance;Decreased safety awareness;Cardiopulmonary status limiting activity;Decreased knowledge of use of DME;Pain       PT Treatment Interventions DME instruction;Therapeutic activities;Gait training;Therapeutic exercise;Patient/family education;Balance training;Stair training;Functional mobility training;Neuromuscular re-education    PT Goals (Current goals can be found in the Care Plan section)  Acute Rehab PT Goals Patient Stated Goal: home PT Goal Formulation: With patient Time For Goal Achievement: 06/15/23 Potential to Achieve Goals: Good    Frequency Min 1X/week     Co-evaluation               AM-PAC PT "6 Clicks" Mobility  Outcome Measure Help needed turning from your back to your side while in a flat bed without using bedrails?: None Help needed moving from lying on your back to sitting on the side of a flat bed without using bedrails?: None Help needed moving to and from a bed to a chair (including a wheelchair)?: A Little Help needed standing up from a chair using your arms (e.g., wheelchair or bedside chair)?: A Little Help needed to walk in hospital room?: A Little Help needed climbing 3-5 steps with a railing? : A Little 6 Click Score: 20    End of Session Equipment Utilized During Treatment: Oxygen Activity Tolerance: Patient tolerated treatment well Patient left: in bed;with nursing/sitter in room Nurse Communication: Mobility status PT Visit Diagnosis: Other abnormalities of gait and mobility (R26.89)    Time: 8469-6295 PT Time Calculation (min) (ACUTE ONLY): 18 min   Charges:   PT Evaluation $PT Eval Low Complexity: 1 Low   PT General Charges $$ ACUTE PT VISIT: 1 Visit         Marye Round, PT DPT Acute Rehabilitation Services Secure Chat Preferred  Office [phone]   Raquel Sayres Sheliah Plane 06/01/2023, 4:50 PM

## 2023-06-01 NOTE — Progress Notes (Signed)
Progress Note   Patient: Gwendolyn [name] ZOX:[id number] DOB: [birth year] DOA: 05/28/2023     4 DOS: the patient was seen and examined on 06/01/2023   Brief hospital course: Gwendolyn [name] was admitted to the hospital with the working diagnosis of heart failure exacerbation.   67 yo female with the past medical history of hypertension, hypothyroidism, supra renal abdominal aortic stenosis, anemia and tobacco abuse who presented with dyspnea. Recent COVID 19 infection about 3 weeks ago, treated with azithromycin, and then followed by a course of Augmentin. She endorsed new orthopnea and dyspnea, prompting her to call EMS. On her initial physical examination her blood pressure was 220/96, HR 88,  RR 23 and 02 saturation 100%, she had rales and decreased breath sounds, heart with S1 and S2 present and rhythmic, abdomen with no distention and no lower extremity edema.   Chest radiograph with cardiomegaly, bilateral hilar vascular congestion, small bilateral pleural effusions.   CT chest with bilateral small pleural effusions.  No evidence of aortic dissection or aneurysm.  Stable severe atherosclerotic disease of the abdominal aorta resulting in severe stenosis of the proximal aorta at the level of the celiac axis, superior mesenteric artery and renal arteries.  Moderate severe focal stenosis of the origins of the left subclavian and left common carotid arteries.  Moderate severe focal stenosis of the origin of the celiac artery.  Moderate severe focal stenosis of the origin of the right renal artery.   EKG 92 bpm, normal axis, normal intervals, sinus rhythm, q wave V1 and V2, no significant ST segment changes, negative T wave V5 and V6.  Patient has been placed on furosemide for diuresis.  Capsule endoscopy for occult GI bleed.  07/24 difficult to control blood pressure, required nitroglycerin drip.    Assessment and Plan: * Acute on chronic diastolic CHF (congestive heart failure)  (HCC) Echocardiogram with preserved LV systolic function with EF 50 to 55%. Moderate LVH. RV systolic function preserved. LA with severe dilatation. Moderate aortic stenosis. Trivial pericardial effusion.   Abdominal aortic stenosis.   Urine output is 3.250 ml Systolic blood pressure 177 to 194 mmHg.  Continue hydralazine 100 mg tid and isosorbide 120 mg daily.  Amlodipine and labetalol.  Holding RAAS inhibition until renal function more stable.  On SGLT 2 inh.  Resume furosemide 40 mg po daily.   Severe anemia Iron deficiency anemia.  Serum iron 17, TIBC 383, transferrin saturation 4 and ferritin 10.   SP 2 units PRBC transfusion.  IV iron sucrose infusion  Follow up on capsule endoscopy and further GI recommendations.   Hypertensive urgency Continue with hydralazine, isosorbide, labetalol and amlodipine.  She did required nitroglycerin drip for blood pressure control.   AKI (acute kidney injury) (HCC) Hyponatremia.  Right renal artery stenosis.   Renal function today with serum cr at 1,28 with K at 3,4 and serum bicarbonate at 26. Na 131.   Plan to resume furosemide 40 mg po daily.  Continue with SGLT 2 inh Hold on spironolactone for now.  Add Kcl for repletion.   COPD (chronic obstructive pulmonary disease) (HCC) Exacerbation has improved. Continue with bronchodilator therapy, off  systemic corticosteroids.   Mediastinal lymphadenopathy to follow up as outpatient.   Hypothyroidism Continue levothyroxine.   Hyperlipidemia Continue statin and ezetimibe.         Subjective: Patient with no chest pain, dyspnea has improved, no hematochezia or melena.   Physical Exam: Vitals:   06/01/23 0800 06/01/23 1000 06/01/23 1012 06/01/23 1100  BP: (!) 178/57 (!) 191/57 (!) 191/57 (!) 177/58  Pulse: 70  70   Resp: 20  20   Temp: 98.8 F (37.1 C)     TempSrc: Oral     SpO2:      Weight:      Height:       Neurology awake and alert ENT with mild  pallor Cardiovascular with S1 and S2 present and rhythmic with no gallops, rubs, positive systolic murmur at the base.  Respiratory with no rales or wheezing, no rhonchi Abdomen with no distention  No lower extremity edema  Data Reviewed:    Family Communication: I spoke with patient's daughter at the bedside, we talked in detail about patient's condition, plan of care and prognosis and all questions were addressed.   Disposition: Status is: Inpatient Remains inpatient appropriate because: pending capsule endoscopy.   Planned Discharge Destination: Home  Author: Coralie Keens, MD 06/01/2023 1:03 PM  For on call review [email or web address].

## 2023-06-01 NOTE — Progress Notes (Signed)
Progress Note  Patient Name: Gwendolyn [name] MRN: [id number] DOB: Dec 12, 1955 Date of Encounter: 06/01/2023  Attending physician: Coralie Keens Primary care provider: Ralene Ok, MD Primary Cardiologist: Dr. Jacinto Halim  Subjective: Gwendolyn [name] is a 67 y.o. Caucasian female who was seen and examined at bedside  Shortness of breath is improving No chest pain.  Grandson at bedside.  States that she cannot tolerate higher dose of labetalol.   Objective: Vital Signs in the last 24 hours: Temp:  [97.8 F (36.6 C)-99.3 F (37.4 C)] 98.8 F (37.1 C) (07/25 0404) Pulse Rate:  [70-85] 70 (07/25 0600) Resp:  [12-26] 12 (07/25 0600) BP: (163-210)/(47-77) 191/56 (07/25 0600) SpO2:  [90 %-99 %] 96 % (07/25 0600) FiO2 (%):  [28 %-32 %] 32 % (07/24 2015) Weight:  [66.9 kg] 66.9 kg (07/25 0352)  Intake/Output:  Intake/Output Summary (Last 24 hours) at 06/01/2023 0642 Last data filed at 06/01/2023 0350 Gross per 24 hour  Intake 240 ml  Output 3250 ml  Net -3010 ml    Net IO Since Admission: -5,109.84 mL [06/01/23 0642]  Weights:     06/01/2023    3:52 AM 05/31/2023    5:44 AM 05/30/2023    5:14 AM  Last 3 Weights  Weight (lbs) 147 lb 6.4 oz 150 lb 4.8 oz 151 lb 1.6 oz  Weight (kg) 66.86 kg 68.176 kg 68.539 kg      Telemetry:  Overnight telemetry shows SR, which I personally reviewed.   Physical examination: PHYSICAL EXAM: Vitals:   06/01/23 0404 06/01/23 0439 06/01/23 0500 06/01/23 0600  BP: (!) 196/55 (!) 188/51 (!) 184/56 (!) 191/56  Pulse: 74 74 70 70  Resp: 20 20 20 12   Temp: 98.8 F (37.1 C)     TempSrc: Oral     SpO2: 95% 94% 96% 96%  Weight:      Height:        Physical Exam  Constitutional: No distress.  Age appropriate, hemodynamically stable.   Neck: No JVD present.  Cardiovascular: Normal rate, regular rhythm, S1 normal, S2 normal, intact distal pulses and normal pulses. Exam reveals no gallop, no S3 and no S4.  Murmur  heard. Midsystolic murmur is present with a grade of 3/6 at the upper right sternal border radiating to the neck. Decrescendo diastolic murmur is present with a grade of 3/4 at the upper right sternal border radiating to the apex. Pulses:      Carotid pulses are  on the right side with bruit and  on the left side with bruit. Pulmonary/Chest: Effort normal. No stridor. She has no rales. She has scattered wheezes.  Abdominal: Soft. Bowel sounds are normal. She exhibits no distension. There is no abdominal tenderness.  Musculoskeletal:        General: No edema.     Cervical back: Neck supple.  Neurological: She is alert and oriented to person, place, and time. She has intact cranial nerves (2-12).  Skin: Skin is warm and moist.     Lab Results: Chemistry Recent Labs  Lab 05/28/23 1240 05/29/23 0031 05/30/23 2359 05/31/23 1349 06/01/23 0107  NA 131*   < > 128* 129* 131*  K 3.5   < > 4.1 4.1 3.4*  CL 102   < > 97* 96* 95*  CO2 22   < > 19* 23 26  GLUCOSE 111*   < > 118* 118* 125*  BUN 12   < > 43* 41* 40*  CREATININE 0.78   < >  2.24* 1.56* 1.28*  CALCIUM 8.7*   < > 8.9 9.0 8.7*  PROT 6.6  --   --   --   --   ALBUMIN 4.0  --   --   --   --   AST 15  --   --   --   --   ALT 14  --   --   --   --   ALKPHOS 54  --   --   --   --   BILITOT 0.4  --   --   --   --   GFRNONAA >60   < > 23* 36* 46*  ANIONGAP 7   < > 12 10 10    < > = values in this interval not displayed.    Hematology Recent Labs  Lab 05/30/23 0815 05/30/23 2100 05/30/23 2359 06/01/23 0107  WBC 12.5*  --  13.5* 12.5*  RBC 2.57*  --  3.80* 3.63*  HGB 7.1* 11.2* 10.7* 10.2*  HCT 22.3* 33.3* 32.2* 30.4*  MCV 86.8  --  84.7 83.7  MCH 27.6  --  28.2 28.1  MCHC 31.8  --  33.2 33.6  RDW 13.8  --  13.2 13.4  PLT 204  --  237 241   High Sensitivity Troponin:   Recent Labs  Lab 05/28/23 1420  TROPONINIHS 16     Cardiac EnzymesNo results for input(s): "TROPONINI" in the last 168 hours. No results for input(s):  "TROPIPOC" in the last 168 hours.  BNP Recent Labs  Lab 05/28/23 1420 05/29/23 0031 05/30/23 0815  BNP 1,292.5* 1,993.0* 1,252.8*    DDimer No results for input(s): "DDIMER" in the last 168 hours.  Hemoglobin A1c: No results found for: "HGBA1C", "MPG" TSH  Recent Labs    05/28/23 1240  TSH 1.427   Lipid Panel  Lab Results  Component Value Date   CHOL 165 05/30/2023   HDL 27 (L) 05/30/2023   LDLCALC 122 (H) 05/30/2023   LDLDIRECT 118 (H) 05/30/2023   TRIG 82 05/30/2023   CHOLHDL 6.1 05/30/2023   Drugs of Abuse  No results found for: "LABOPIA", "COCAINSCRNUR", "LABBENZ", "AMPHETMU", "THCU", "LABBARB"    Imaging: DG CHEST PORT 1 VIEW  Result Date: 05/31/2023 CLINICAL DATA:  Shortness of breath. EXAM: PORTABLE CHEST 1 VIEW COMPARISON:  May 28, 2023. FINDINGS: Stable cardiomegaly. Bilateral pulmonary edema is noted with bilateral pleural effusions which are mildly enlarged. Bony thorax unremarkable. IMPRESSION: Bilateral pulmonary edema and small bilateral pleural effusions which are increased compared to prior exam. Electronically Signed   By: Lupita Raider M.D.   On: 05/31/2023 08:32    CARDIAC DATABASE: EKG: May 28, 2023: Sinus rhythm, 92 bpm, ST-T changes in the lateral leads consider LVH with repolarization abnormality but ischemia cannot be ruled out.    Echocardiogram: 02/03/2022:  1. Left ventricular ejection fraction, by estimation, is 50 to 55%. The  left ventricle has low normal function. The left ventricle has no regional  wall motion abnormalities. There is mild left ventricular hypertrophy.  Left ventricular diastolic  parameters are consistent with Grade II diastolic dysfunction  (pseudonormalization).   2. Right ventricular systolic function is normal. The right ventricular  size is normal.   3. Left atrial size was severely dilated.   4. The mitral valve is normal in structure. Mild mitral valve  regurgitation. No evidence of mitral stenosis.   5.  The aortic valve is tricuspid. Aortic valve regurgitation is moderate.   6. The  inferior vena cava is normal in size with <50% respiratory  variability, suggesting right atrial pressure of 8 mmHg.   Comparison(s): A prior study was performed on 09/09/2022. No significant  change.   05/29/2023:  1. Left ventricular ejection fraction, by estimation, is 50 to 55%. The  left ventricle has low normal function. The left ventricle has no regional  wall motion abnormalities. There is moderate left ventricular hypertrophy.  Left ventricular diastolic  parameters are consistent with Grade II diastolic dysfunction  (pseudonormalization). Elevated left atrial pressure.   2. Right ventricular systolic function is normal. The right ventricular  size is normal. Tricuspid regurgitation signal is inadequate for assessing  PA pressure.   3. Left atrial size was severely dilated.   4. The mitral valve is normal in structure. No evidence of mitral valve  regurgitation.   5. The aortic valve was not well visualized. Aortic valve regurgitation  is mild. Moderate aortic valve stenosis. Vmax 3.0 m/s, MG , AVA 1.2  cm^2, DI 0.43   6. The inferior vena cava is dilated in size with <50% respiratory  variability, suggesting right atrial pressure of 15 mmHg.    Stress Testing:  Exercise nuclear stress test 03/28/2022: Normal ECG stress. The patient exercised for 2 minutes and 59 seconds of a Bruce protocol, achieving approximately 4.64 METs and achieved 88% MPHR. Stress terminated due to fatigue.  The heart rate response was accelerated. Exercise capacity was severely reduced.   The baseline blood pressure was 200/70 mmHg and increased to 230/76 mmHg, which is a normal response to exercise. Myocardial perfusion is normal. The LV is mildly dilated in both rest and stress. Overall LV systolic function is mildly abnormal without regional wall motion abnormalities. Stress LV EF: 43%.  No previous exam available  for comparison. Intermediate risk. Findings may suggest non ischemic cardiomyopathy.    Abdominal arteriogram and selective left renal arteriogram and lithotripsy balloon angioplasty of the suprarenal abdominal aorta using shockwave M5 catheter 02/03/2022: Abdominal aorta: 2 renal arteries 1 on either sides, mild atherosclerotic changes are present.  Abdominal aorta is diffusely diseased and calcific.  There is a high-grade 90% to 99% stenosis of the suprarenal abdominal aorta with heavy calcification. Intervention data: Successful shockwave lithotripsy using a 8.0 x 60 mm shockwave M5 balloon, gradual progression of balloon inflation pressure from 4 atmospheric pressure already up to 10 atm pressure and using a 8.43 mm lumen, followed by balloon angioplasty with a 4.0 x 60 mm Mustang balloon at 4 atmospheric pressure. Stenosis reduced from 95% to probably insignificant stenosis (<50%).  Angiogram not performed to conserve contrast and would have been poorly visualized with CO2.   Pressure gradient pre and post:  Opening right common femoral artery pressure 52/44 mmHg.  Suprarenal thoracic aortic pressure 180/58 mmHg with a mean of 103 mmHg.  Pressure gradient of approximately 130 mmHg.  Closing right femoral artery pressure 121/62 mmHg.  Pressure gradient approximately 30 mmHg post angioplasty.  5-10 mL contrast utilized 20 to visualize abdominal aorta.  Right femoral arterial access closed with Perclose.  Recommendation: Start the patient on Plavix, I expect improvement in renal function, do not treat hypertension unless blood pressure >160 mmHg.  We will change all the antihypertensive medications.  Scheduled Meds:  amLODipine  10 mg Oral Daily   arformoterol  15 mcg Nebulization BID   budesonide (PULMICORT) nebulizer solution  0.25 mg Nebulization BID   cyanocobalamin  1,000 mcg Subcutaneous Q2000   dapagliflozin propanediol  10 mg Oral  Daily   ezetimibe  10 mg Oral Daily   hydrALAZINE  100  mg Oral TID   ipratropium-albuterol  3 mL Nebulization BID   isosorbide mononitrate  120 mg Oral Daily   labetalol  300 mg Oral TID   levothyroxine  100 mcg Oral Q0600   nicotine  21 mg Transdermal Daily   pantoprazole  40 mg Oral Daily   polyethylene glycol  17 g Oral Daily   rosuvastatin  40 mg Oral Daily   senna-docusate  1 tablet Oral QHS   sodium chloride flush  3 mL Intravenous Q12H    Continuous Infusions:  nitroGLYCERIN 65 mcg/min (06/01/23 0610)    PRN Meds: acetaminophen **OR** acetaminophen, albuterol, chlorpheniramine-HYDROcodone, hydrALAZINE, ondansetron (ZOFRAN) IV, mouth rinse, oxyCODONE   IMPRESSION & RECOMMENDATIONS: ISIDRA MINGS is a 67 y.o. Caucasian female whose past medical history and cardiac risk factors include:  resistant hypertension, mixed hyperlipidemia, nicotine dependence, COPD, known distal aorta stenosis (s/p successful Abdominal arteriogram and selective left renal arteriogram and lithotripsy balloon angioplasty of the suprarenal abdominal aorta using shockwave M5 catheter 02/03/2022), chronic HFpEF, hx of hypertensive emergency.   Impression: Hypertensive emergency on admission Resistant hypertension Acute kidney injury Acute on chronic HFpEF, stage C, NYHA class III. Aortic stenosis, moderate Hyperlipidemia, pure Acute COPD exacerbation. Recent COVID-19 infection. Cigarette smoking.. Symptomatic anemia status post blood transfusion Abdominal aortic stenosis status post angioplasty Renal artery stenosis Left subclavian and common carotid artery stenosis per imaging Moderate/severe celiac artery stenosis per imaging Mediastinal lymphadenopathy  Recommendations: Hypertensive emergency on admission: Resistant hypertension at baseline. Renal artery stenosis per imaging Remains on nitro drip. Patient states that see cannot tolerate higher dose of labetalol. Will transition her back to labetalol 400 mg twice daily Increase Imdur from 60  mg p.o. daily to 120 mg p.o. daily Once the morning medications have been given recommend weaning off nitro drip, RN notified. . Renal function improving Hold ARB in the setting of renal artery stenosis and AKI. Medications reconciled. Monitor BUN and creatinine.  AKI:  Improving Likely secondary to correcting her BP Monitor BUN and Cr.  D/c ARB Monitor for now.   Acute on chronic HFpEF, stage C, NYHA class III Improving. BNP downtrending. Did not tolerate the transition from lisinopril to losartan -likely secondary to renal artery stenosis. Continue Farxiga, hydralazine/Imdur, labetalol. Medication changes as discussed above. May consider initiating spironolactone as outpatient -will hold off now given AKI. Net IO Since Admission: -5,109.84 mL [06/01/23 0642] Will need close outpatient follow-up.  Moderate aortic stenosis: Monitor for now. Need longitudinal follow-up as outpatient  Acute COPD exacerbation: Recent COVID-19 infection. Cigarette smoking. COPD and post-COVID management per primary team. Reemphasized importance of complete smoking cessation in the setting of underlying COPD and extensive vascular disease. 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 Most recent lipid profile reviewed. LDL still not at goal despite being on high intensity statin therapy. Started Zetia 10 mg p.o. daily during this hospitalization Consider PCSK9 inhibitors as outpatient given the degree of vascular disease. LP(a) <8.4 (nml)  Symptomatic anemia status post PRBCs.   Undergoing anemia workup with primary and GI. Scheduled for capsule endoscopy 05/31/2023 Monitor H&H  Abdominal aortic stenosis status post angioplasty Renal artery stenosis Left subclavian and common carotid artery stenosis per imaging Moderate/severe celiac artery stenosis per imaging After acute hospitalization  recommend reestablishing care with Dr. Jacinto Halim to discuss long-term management. Clinically she  has not lost any significant weight and does not have postprandial symptoms.   She also does not complain of pain with overhead activities.   Blood pressure management as discussed above Aspirin held off secondary to anemia. Lipid management as discussed above Avoid over correcting her blood pressures /hypotension as she may become more symptomatic given the degree of stenosis in her vascular tree.  Mediastinal lymphadenopathy: Defer to primary team.  Noted on CTA of the chest abdomen pelvis.  Would recommend further workup given her prolonged history of smoking.  Patient's questions and concerns were addressed to her satisfaction. She voices understanding of the instructions provided during this encounter.   This note was created using a voice recognition software as a result there may be grammatical errors inadvertently enclosed that do not reflect the nature of this encounter. Every attempt is made to correct such errors.  Delilah Shan [facility]  Pager:  [phone] Office: [phone] 06/01/2023, 9:39 AM

## 2023-06-01 NOTE — Progress Notes (Signed)
Subjective: No complaints.  Feeling better with breathing.  Objective: Vital signs in last 24 hours: Temp:  [97.8 F (36.6 C)-99.3 F (37.4 C)] 98.7 F (37.1 C) (07/25 1616) Pulse Rate:  [70-85] 77 (07/25 1632) Resp:  [12-23] 18 (07/25 1632) BP: (163-224)/(47-77) 224/59 (07/25 1653) SpO2:  [93 %-99 %] 93 % (07/25 1616) FiO2 (%):  [32 %] 32 % (07/24 2015) Weight:  [66.9 kg] 66.9 kg (07/25 0352) Last BM Date : 05/29/23  Intake/Output from previous day: 07/24 0701 - 07/25 0700 In: 240 [P.O.:240] Out: 3250 [Urine:3250] Intake/Output this shift: Total I/O In: 480 [P.O.:480] Out: 800 [Urine:800]  General appearance: alert and no distress GI: soft, non-tender; bowel sounds normal; no masses,  no organomegaly  Lab Results: Recent Labs    05/30/23 0815 05/30/23 2100 05/30/23 2359 06/01/23 0107  WBC 12.5*  --  13.5* 12.5*  HGB 7.1* 11.2* 10.7* 10.2*  HCT 22.3* 33.3* 32.2* 30.4*  PLT 204  --  237 241   BMET Recent Labs    05/30/23 2359 05/31/23 1349 06/01/23 0107  NA 128* 129* 131*  K 4.1 4.1 3.4*  CL 97* 96* 95*  CO2 19* 23 26  GLUCOSE 118* 118* 125*  BUN 43* 41* 40*  CREATININE 2.24* 1.56* 1.28*  CALCIUM 8.9 9.0 8.7*   LFT No results for input(s): "PROT", "ALBUMIN", "AST", "ALT", "ALKPHOS", "BILITOT", "BILIDIR", "IBILI" in the last 72 hours. PT/INR No results for input(s): "LABPROT", "INR" in the last 72 hours. Hepatitis Panel No results for input(s): "HEPBSAG", "HCVAB", "HEPAIGM", "HEPBIGM" in the last 72 hours. C-Diff No results for input(s): "CDIFFTOX" in the last 72 hours. Fecal Lactopherrin No results for input(s): "FECLLACTOFRN" in the last 72 hours.  Studies/Results: DG CHEST PORT 1 VIEW  Result Date: 05/31/2023 CLINICAL DATA:  Shortness of breath. EXAM: PORTABLE CHEST 1 VIEW COMPARISON:  May 28, 2023. FINDINGS: Stable cardiomegaly. Bilateral pulmonary edema is noted with bilateral pleural effusions which are mildly enlarged. Bony thorax  unremarkable. IMPRESSION: Bilateral pulmonary edema and small bilateral pleural effusions which are increased compared to prior exam. Electronically Signed   By: Lupita Raider M.D.   On: 05/31/2023 08:32    Medications: Scheduled:  amLODipine  10 mg Oral Daily   arformoterol  15 mcg Nebulization BID   budesonide (PULMICORT) nebulizer solution  0.25 mg Nebulization BID   cyanocobalamin  1,000 mcg Subcutaneous Q2000   dapagliflozin propanediol  10 mg Oral Daily   ezetimibe  10 mg Oral Daily   furosemide  40 mg Oral Daily   hydrALAZINE  100 mg Oral TID   ipratropium-albuterol  3 mL Nebulization BID   isosorbide mononitrate  120 mg Oral Daily   labetalol  400 mg Oral BID   levothyroxine  100 mcg Oral Q0600   nicotine  21 mg Transdermal Daily   pantoprazole  40 mg Oral Daily   polyethylene glycol  17 g Oral Daily   polyethylene glycol-electrolytes  4,000 mL Oral Once   rosuvastatin  40 mg Oral Daily   senna-docusate  1 tablet Oral QHS   sodium chloride flush  3 mL Intravenous Q12H   Continuous:  Assessment/Plan: 1) Bleeding in the TI. 2) Anemia. 3) CHF.   The patient is improving form the cardiac standpoint.  Her SOB is less.  The VCE was positive for fresh blood in the TI.  She will have further evaluation with a colonoscopy tomorrow.  Hopefully the bleeding source can be reached in the TI.  Plan: 1)  Colonoscopy tomorrow.  LOS: 4 days   Mathew Storck D 06/01/2023, 5:30 PM

## 2023-06-01 NOTE — H&P (View-Only) (Signed)
Subjective: No complaints.  Feeling better with breathing.  Objective: Vital signs in last 24 hours: Temp:  [97.8 F (36.6 C)-99.3 F (37.4 C)] 98.7 F (37.1 C) (07/25 1616) Pulse Rate:  [70-85] 77 (07/25 1632) Resp:  [12-23] 18 (07/25 1632) BP: (163-224)/(47-77) 224/59 (07/25 1653) SpO2:  [93 %-99 %] 93 % (07/25 1616) FiO2 (%):  [32 %] 32 % (07/24 2015) Weight:  [66.9 kg] 66.9 kg (07/25 0352) Last BM Date : 05/29/23  Intake/Output from previous day: 07/24 0701 - 07/25 0700 In: 240 [P.O.:240] Out: 3250 [Urine:3250] Intake/Output this shift: Total I/O In: 480 [P.O.:480] Out: 800 [Urine:800]  General appearance: alert and no distress GI: soft, non-tender; bowel sounds normal; no masses,  no organomegaly  Lab Results: Recent Labs    05/30/23 0815 05/30/23 2100 05/30/23 2359 06/01/23 0107  WBC 12.5*  --  13.5* 12.5*  HGB 7.1* 11.2* 10.7* 10.2*  HCT 22.3* 33.3* 32.2* 30.4*  PLT 204  --  237 241   BMET Recent Labs    05/30/23 2359 05/31/23 1349 06/01/23 0107  NA 128* 129* 131*  K 4.1 4.1 3.4*  CL 97* 96* 95*  CO2 19* 23 26  GLUCOSE 118* 118* 125*  BUN 43* 41* 40*  CREATININE 2.24* 1.56* 1.28*  CALCIUM 8.9 9.0 8.7*   LFT No results for input(s): "PROT", "ALBUMIN", "AST", "ALT", "ALKPHOS", "BILITOT", "BILIDIR", "IBILI" in the last 72 hours. PT/INR No results for input(s): "LABPROT", "INR" in the last 72 hours. Hepatitis Panel No results for input(s): "HEPBSAG", "HCVAB", "HEPAIGM", "HEPBIGM" in the last 72 hours. C-Diff No results for input(s): "CDIFFTOX" in the last 72 hours. Fecal Lactopherrin No results for input(s): "FECLLACTOFRN" in the last 72 hours.  Studies/Results: DG CHEST PORT 1 VIEW  Result Date: 05/31/2023 CLINICAL DATA:  Shortness of breath. EXAM: PORTABLE CHEST 1 VIEW COMPARISON:  May 28, 2023. FINDINGS: Stable cardiomegaly. Bilateral pulmonary edema is noted with bilateral pleural effusions which are mildly enlarged. Bony thorax  unremarkable. IMPRESSION: Bilateral pulmonary edema and small bilateral pleural effusions which are increased compared to prior exam. Electronically Signed   By: Lupita Raider M.D.   On: 05/31/2023 08:32    Medications: Scheduled:  amLODipine  10 mg Oral Daily   arformoterol  15 mcg Nebulization BID   budesonide (PULMICORT) nebulizer solution  0.25 mg Nebulization BID   cyanocobalamin  1,000 mcg Subcutaneous Q2000   dapagliflozin propanediol  10 mg Oral Daily   ezetimibe  10 mg Oral Daily   furosemide  40 mg Oral Daily   hydrALAZINE  100 mg Oral TID   ipratropium-albuterol  3 mL Nebulization BID   isosorbide mononitrate  120 mg Oral Daily   labetalol  400 mg Oral BID   levothyroxine  100 mcg Oral Q0600   nicotine  21 mg Transdermal Daily   pantoprazole  40 mg Oral Daily   polyethylene glycol  17 g Oral Daily   polyethylene glycol-electrolytes  4,000 mL Oral Once   rosuvastatin  40 mg Oral Daily   senna-docusate  1 tablet Oral QHS   sodium chloride flush  3 mL Intravenous Q12H   Continuous:  Assessment/Plan: 1) Bleeding in the TI. 2) Anemia. 3) CHF.   The patient is improving form the cardiac standpoint.  Her SOB is less.  The VCE was positive for fresh blood in the TI.  She will have further evaluation with a colonoscopy tomorrow.  Hopefully the bleeding source can be reached in the TI.  Plan: 1)  Colonoscopy tomorrow.  LOS: 4 days   Olivia Royse D 06/01/2023, 5:30 PM

## 2023-06-02 ENCOUNTER — Encounter (HOSPITAL_COMMUNITY): Payer: Self-pay | Admitting: Internal Medicine

## 2023-06-02 ENCOUNTER — Encounter (HOSPITAL_COMMUNITY)
Admission: EM | Disposition: A | Discharge status: Home / Self Care | Payer: Self-pay | Source: Home / Self Care | Attending: Internal Medicine

## 2023-06-02 ENCOUNTER — Other Ambulatory Visit (HOSPITAL_COMMUNITY): Payer: Self-pay

## 2023-06-02 ENCOUNTER — Inpatient Hospital Stay (HOSPITAL_COMMUNITY): Payer: Medicare Other | Admitting: Anesthesiology

## 2023-06-02 DIAGNOSIS — K922 Gastrointestinal hemorrhage, unspecified: Secondary | ICD-10-CM

## 2023-06-02 DIAGNOSIS — F17210 Nicotine dependence, cigarettes, uncomplicated: Secondary | ICD-10-CM | POA: Diagnosis not present

## 2023-06-02 DIAGNOSIS — J441 Chronic obstructive pulmonary disease with (acute) exacerbation: Secondary | ICD-10-CM | POA: Diagnosis not present

## 2023-06-02 DIAGNOSIS — I5033 Acute on chronic diastolic (congestive) heart failure: Secondary | ICD-10-CM | POA: Diagnosis not present

## 2023-06-02 DIAGNOSIS — K633 Ulcer of intestine: Secondary | ICD-10-CM | POA: Diagnosis not present

## 2023-06-02 DIAGNOSIS — I110 Hypertensive heart disease with heart failure: Secondary | ICD-10-CM | POA: Diagnosis not present

## 2023-06-02 DIAGNOSIS — I160 Hypertensive urgency: Secondary | ICD-10-CM | POA: Diagnosis not present

## 2023-06-02 HISTORY — PX: COLONOSCOPY WITH PROPOFOL: SHX5780

## 2023-06-02 SURGERY — COLONOSCOPY WITH PROPOFOL
Anesthesia: Monitor Anesthesia Care

## 2023-06-02 MED ORDER — POTASSIUM CHLORIDE CRYS ER 20 MEQ PO TBCR
40.0000 meq | EXTENDED_RELEASE_TABLET | ORAL | Status: AC
  Administered 2023-06-02: 40 meq via ORAL
  Filled 2023-06-02 (×2): qty 2

## 2023-06-02 MED ORDER — SODIUM CHLORIDE 0.9 % IV SOLN: INTRAVENOUS | Status: DC

## 2023-06-02 MED ORDER — IRON SUCROSE 500 MG IVPB - SIMPLE MED
500.0000 mg | Freq: Once | INTRAVENOUS | Status: AC
  Administered 2023-06-02: 500 mg via INTRAVENOUS
  Filled 2023-06-02: qty 275

## 2023-06-02 MED ORDER — LACTATED RINGERS IV SOLN
INTRAVENOUS | Status: AC | PRN
  Administered 2023-06-02: 10 mL/h via INTRAVENOUS

## 2023-06-02 MED ORDER — NITROGLYCERIN IN D5W 200-5 MCG/ML-% IV SOLN
0.0000 ug/min | INTRAVENOUS | Status: DC
  Administered 2023-06-02: 5 ug/min via INTRAVENOUS
  Administered 2023-06-02: 10 ug/min via INTRAVENOUS
  Filled 2023-06-02: qty 250

## 2023-06-02 MED ORDER — SPIRONOLACTONE 12.5 MG HALF TABLET
12.5000 mg | ORAL_TABLET | Freq: Every day | ORAL | Status: DC
  Administered 2023-06-02 – 2023-06-03 (×2): 12.5 mg via ORAL
  Filled 2023-06-02 (×2): qty 1

## 2023-06-02 MED ORDER — PROPOFOL 10 MG/ML IV BOLUS
INTRAVENOUS | Status: DC | PRN
  Administered 2023-06-02: 40 mg via INTRAVENOUS

## 2023-06-02 MED ORDER — PROPOFOL 500 MG/50ML IV EMUL
INTRAVENOUS | Status: DC | PRN
  Administered 2023-06-02: 100 ug/kg/min via INTRAVENOUS

## 2023-06-02 MED ORDER — POLYETHYLENE GLYCOL 3350 17 GM/SCOOP PO POWD
1.0000 | Freq: Once | ORAL | Status: AC
  Administered 2023-06-02: 255 g via ORAL
  Filled 2023-06-02: qty 255

## 2023-06-02 MED ORDER — LABETALOL HCL 5 MG/ML IV SOLN
10.0000 mg | INTRAVENOUS | Status: DC | PRN
  Administered 2023-06-02 – 2023-06-03 (×3): 10 mg via INTRAVENOUS
  Filled 2023-06-02 (×4): qty 4

## 2023-06-02 MED ORDER — ISOSORBIDE DINITRATE 10 MG PO TABS
30.0000 mg | ORAL_TABLET | Freq: Three times a day (TID) | ORAL | Status: DC
  Administered 2023-06-02 – 2023-06-03 (×2): 30 mg via ORAL
  Filled 2023-06-02 (×2): qty 3

## 2023-06-02 MED ORDER — POTASSIUM CHLORIDE 20 MEQ PO PACK
40.0000 meq | PACK | ORAL | Status: DC
  Filled 2023-06-02: qty 2

## 2023-06-02 SURGICAL SUPPLY — 22 items

## 2023-06-02 NOTE — Plan of Care (Signed)
  Problem: Education: Goal: Ability to demonstrate management of disease process will improve Outcome: Progressing Goal: Ability to verbalize understanding of medication therapies will improve Outcome: Progressing Goal: Individualized Educational Video(s) Outcome: Progressing   Problem: Activity: Goal: Capacity to carry out activities will improve Outcome: Progressing   Problem: Coping: Goal: Level of anxiety will decrease Outcome: Progressing   Problem: Elimination: Goal: Will not experience complications related to bowel motility Outcome: Progressing

## 2023-06-02 NOTE — Op Note (Signed)
[facility] Patient Name: Gwendolyn [name] Procedure Date : 06/02/2023 MRN: [id number] Attending MD: Jeani Hawking , MD, [id number] Date of Birth: 1956-08-31 CSN: [id number] Age: 67 Admit Type: Inpatient Procedure:                Colonoscopy Indications:              Iron deficiency anemia Providers:                Jeani Hawking, MD, Fransisca Connors, Priscella Mann,                            Technician Referring MD:              Medicines:                Propofol per Anesthesia Complications:            No immediate complications. Estimated Blood Loss:     Estimated blood loss: none. Procedure:                Pre-Anesthesia Assessment:                           - Prior to the procedure, a History and Physical                            was performed, and patient medications and                            allergies were reviewed. The patient's tolerance of                            previous anesthesia was also reviewed. The risks                            and benefits of the procedure and the sedation                            options and risks were discussed with the patient.                            All questions were answered, and informed consent                            was obtained. Prior Anticoagulants: The patient has                            taken no anticoagulant or antiplatelet agents. ASA                            Grade Assessment: III - A patient with severe                            systemic disease. After reviewing the risks and                            benefits,  the patient was deemed in satisfactory                            condition to undergo the procedure.                           - Sedation was administered by an anesthesia                            professional. Deep sedation was attained.                           After obtaining informed consent, the colonoscope                            was passed under direct vision. Throughout  the                            procedure, the patient's blood pressure, pulse, and                            oxygen saturations were monitored continuously. The                            CF-HQ190L ([id number] Olympus coloscope was                            introduced through the anus and advanced to the the                            terminal ileum. The colonoscopy was technically                            difficult and complex due to poor bowel prep and                            significant looping. The patient tolerated the                            procedure well. The quality of the bowel                            preparation was poor. The terminal ileum, ileocecal                            valve, appendiceal orifice, and rectum were                            photographed. Scope In: 12:55:54 PM Scope Out: 1:31:27 PM Scope Withdrawal Time: 0 hours 27 minutes 38 seconds  Total Procedure Duration: 0 hours 35 minutes 33 seconds  Findings:      The terminal ileum appeared normal.      Copious quantities of stool was found in the entire colon, precluding       visualization.  The patient's prep was poor. Thick to thin liquid stool was noted       throughout the entire colon, but the objective was to reach the distal       TI. Significant looping occurred when trying to intubate the TI. Placing       the patient in a supine position and applying abdominal pressure allowed       for the distal 10-15 cm of the TI to be inspected. There was no evidence       of any blood or any suspicious bleeding sites. At the ICV there was some       scant evidence of erosions, but no bleeding in this area or in the       ascending colon. Impression:               - Preparation of the colon was poor.                           - The examined portion of the ileum was normal.                           - Stool in the entire examined colon.                           - No specimens  collected. Recommendation:           - Return patient to hospital ward for ongoing care.                           - Resume regular diet.                           - Continue present medications.                           - If anemia worsens a bleeding scan can be                            performed versus having IR evaluate the distal                            small bowel.                           - Transfuse as necessary. Procedure Code(s):        --- Professional ---                           [phone], Colonoscopy, flexible; diagnostic, including                            collection of specimen(s) by brushing or washing,                            when performed (separate procedure) Diagnosis Code(s):        --- Professional ---  D50.9, Iron deficiency anemia, unspecified CPT copyright 2022 [employer]. All rights reserved. The codes documented in this report are preliminary and upon coder review may  be revised to meet current compliance requirements. Jeani Hawking, MD Jeani Hawking, MD 06/02/2023 1:47:57 PM This report has been signed electronically. Number of Addenda: 0

## 2023-06-02 NOTE — Progress Notes (Signed)
Approximately 0800-- This RN messaged MD Elnoria Howard regarding pt's bowel prep.Upon AM assessment, pt appeared to have only drank about 20% of bowel prep solution since it was started yesterday. Per pt, she has only had 1 BM since solution was started. She understands that she has a colonoscopy procedure scheduled for this AM, but she states she has difficulty drinking prep solution. Miralax bowel prep ordered per MD.   Pt stated that she felt "unable to drink" Miralax bowel prep at this time. This RN encouraged pt and provided education on the need for bowel prep. Pt verbalized understanding, but stated that she did not believe she could drink it at this time. Currently, Miralax bowel prep not consumed, with total bowel prep solution equaling 20% since last night. MD [facility] notified.    Approximately 1200--Pt left for Endoscopy with this RN on nitroglycerin gtt and 5L O2 [place]. Report provided by this RN to staff.

## 2023-06-02 NOTE — Progress Notes (Signed)
Approximately 1830-- This RN messaged MD Jacinto Halim regarding pt. Pt's BP still very difficult to control. Has received PRN doses of IV hydralazine and is requiring frequent titration on nitroglycerin gtt. Pt currently on nitro gtt is at and BP is 198/59. BP unfortunately has been elevated all shift. Pt's only complaint remains a headache which is relieved by PRN pain medication. Charge RN, Michele Rockers, made aware.  Orders placed per MD Ganji. RN to discontinue Nitroglycerin gtt and administer new PRN meds when appropriate. RN to continue to monitor.

## 2023-06-02 NOTE — Progress Notes (Signed)
OT Cancellation Note  Patient Details Name: Gwendolyn [name] MRN: [id number] DOB: 05/18/1956   Cancelled Treatment:    Reason Eval/Treat Not Completed: Patient at procedure or test/ unavailable (colonoscopy)  Evern Bio 06/02/2023, 1:05 PM Berna Spare, OTR/L Acute Rehabilitation Services Office: [phone]

## 2023-06-02 NOTE — Progress Notes (Signed)
MEWS Progress Note  Patient Details Name: Gwendolyn [name] MRN: [id number] DOB: 04-Sep-1956 Today's Date: 06/02/2023   MEWS Flowsheet Documentation:  Assess: MEWS Score Temp: 99.3 F (37.4 C) BP: (!) 210/70 MAP (mmHg): 79 Pulse Rate: 78 ECG Heart Rate: 77 Resp: 20 Level of Consciousness: Alert SpO2: 96 % O2 Device: Nasal Cannula Patient Activity (if Appropriate): In bed O2 Flow Rate (L/min): 3 L/min FiO2 (%): 32 % Assess: MEWS Score MEWS Temp: 0 MEWS Systolic: 2 MEWS Pulse: 0 MEWS RR: 0 MEWS LOC: 0 MEWS Score: 2 MEWS Score Color: Yellow Assess: SIRS CRITERIA SIRS Temperature : 0 SIRS Respirations : 0 SIRS Pulse: 0 SIRS WBC: 0 SIRS Score Sum : 0 SIRS Temperature : 0 SIRS Pulse: 0 SIRS Respirations : 0 SIRS WBC: 0 SIRS Score Sum : 0 Assess: if the MEWS score is Yellow or Red Were vital signs accurate and taken at a resting state?: Yes Does the patient meet 2 or more of the SIRS criteria?: No Does the patient have a confirmed or suspected source of infection?: No MEWS guidelines implemented : Yes, yellow Treat MEWS Interventions: Considered administering scheduled or prn medications/treatments as ordered Take Vital Signs Increase Vital Sign Frequency : Yellow: Q2hr x1, continue Q4hrs until patient remains green for 12hrs Escalate MEWS: Escalate: Yellow: Discuss with charge nurse and consider notifying provider and/or RRT        Penne Lash 06/02/2023, 2:43 AM

## 2023-06-02 NOTE — Transfer of Care (Signed)
Immediate Anesthesia Transfer of Care Note  Patient: Gwendolyn [name]  Procedure(s) Performed: COLONOSCOPY WITH PROPOFOL  Patient Location: Endoscopy Unit  Anesthesia Type:MAC  Level of Consciousness: sedated  Airway & Oxygen Therapy: Patient Spontanous Breathing and Patient connected to face mask oxygen  Post-op Assessment: Report given to RN and Post -op Vital signs reviewed and stable  Post vital signs: Reviewed and stable  Last Vitals:  Vitals Value Taken Time  BP 168/56 06/02/23 1340  Temp    Pulse 64 06/02/23 1343  Resp 20 06/02/23 1343  SpO2 96 % 06/02/23 1343  Vitals shown include unfiled device data.  Last Pain:  Vitals:   06/02/23 1156  TempSrc: Temporal  PainSc: 0-No pain      Patients Stated Pain Goal: 0 (05/31/23 1235)  Complications: No notable events documented.

## 2023-06-02 NOTE — Progress Notes (Addendum)
Progress Note   Patient: JAX JAQUEZ NGE:[id number] DOB: 1956/04/04 DOA: 05/28/2023     5 DOS: the patient was seen and examined on 06/02/2023   Brief hospital course: [name] was admitted to the hospital with the working diagnosis of heart failure exacerbation.   67 yo female with the past medical history of hypertension, hypothyroidism, supra renal abdominal aortic stenosis, anemia and tobacco abuse who presented with dyspnea. Recent COVID 19 infection about 3 weeks ago, treated with azithromycin, and then followed by a course of Augmentin. She endorsed new orthopnea and dyspnea, prompting her to call EMS. On her initial physical examination her blood pressure was 220/96, HR 88,  RR 23 and 02 saturation 100%, she had rales and decreased breath sounds, heart with S1 and S2 present and rhythmic, abdomen with no distention and no lower extremity edema.   Chest radiograph with cardiomegaly, bilateral hilar vascular congestion, small bilateral pleural effusions.   CT chest with bilateral small pleural effusions.  No evidence of aortic dissection or aneurysm.  Stable severe atherosclerotic disease of the abdominal aorta resulting in severe stenosis of the proximal aorta at the level of the celiac axis, superior mesenteric artery and renal arteries.  Moderate severe focal stenosis of the origins of the left subclavian and left common carotid arteries.  Moderate severe focal stenosis of the origin of the celiac artery.  Moderate severe focal stenosis of the origin of the right renal artery.   EKG 92 bpm, normal axis, normal intervals, sinus rhythm, q wave V1 and V2, no significant ST segment changes, negative T wave V5 and V6.  Patient has been placed on furosemide for diuresis.  Capsule endoscopy for occult GI bleed.  07/24 difficult to control blood pressure, required nitroglycerin drip.  07/25 capsule endoscopy with positive colonic bleeding, plan for colonoscopy today.    Assessment and Plan: * Acute on chronic diastolic CHF (congestive heart failure) (HCC) Echocardiogram with preserved LV systolic function with EF 50 to 55%. Moderate LVH. RV systolic function preserved. LA with severe dilatation. Moderate aortic stenosis. Trivial pericardial effusion.   Abdominal aortic stenosis.   Urine output is 2,300 ml Systolic blood pressure has been up to 200's mmHg.   Continue hydralazine 100 mg tid, last night placed back on nitroglycerin drip.  Continue with Amlodipine and labetalol.  Continue with SGLT 2 inh and add spironolactone.  Loop diuretic with furosemide 40 mg po daily.   Acute lower GI bleeding Acute blood loss anemia.  Iron deficiency anemia.  Serum iron 17, TIBC 383, transferrin saturation 4 and ferritin 10.   Today Hgb stable at 10,8   SP 2 units PRBC transfusion.  IV iron sucrose infusion, for second dose today. Follow up with colonoscopy.   Hypertensive urgency Continue with hydralazine, labetalol and amlodipine.  Back on nitroglycerin drip for blood pressure control.  Will add spironolactone.  For her renal artery stenosis she will benefit from ARB or ACE inh, when renal function more stable, she may able to transition to entresto.   AKI (acute kidney injury) (HCC) Hyponatremia. Hypokalemia.  Right renal artery stenosis.   Volume status has improved. Renal function with serum cr at 1,0 with K at 3,3 and serum bicarbonate at 27, Na 131.   Plan to resume furosemide 40 mg po daily.  Continue with SGLT 2 inh and add spironolactone.   Add Kcl for repletion, total of 80 meq today.  Follow up renal function in am.   COPD (chronic obstructive pulmonary disease) (  HCC) Exacerbation has improved. Continue with bronchodilator therapy, off  systemic corticosteroids.   Mediastinal lymphadenopathy to follow up as outpatient.   Hypothyroidism Continue levothyroxine.   Hyperlipidemia Continue statin and ezetimibe.          Subjective: Patient with no chest pain or dyspnea, no PND or orthopnea. Having bowel prep for colonoscopy.   Physical Exam: Vitals:   06/02/23 0400 06/02/23 0525 06/02/23 0739 06/02/23 0813  BP:  (!) 178/56  (!) 210/64  Pulse:    73  Resp:    20  Temp: 99.5 F (37.5 C)   98.9 F (37.2 C)  TempSrc: Oral   Oral  SpO2:   98%   Weight:      Height:       Neurology awake and alert ENT with mild pallor Cardiovascular with S1 and S2 present and regular with no gallops, rubs or murmurs No JVD No lower extremity edema Respiratory with no rales or wheezing, no rhonchi Abdomen with no distention  Data Reviewed:    Family Communication: I spoke with patient's daughter at the bedside, we talked in detail about patient's condition, plan of care and prognosis and all questions were addressed.   Disposition: Status is: Inpatient Remains inpatient appropriate because: colonoscopy   Planned Discharge Destination: Home     Author: Coralie Keens, MD 06/02/2023 10:13 AM  For on call review [email or web address].

## 2023-06-02 NOTE — Progress Notes (Signed)
   06/02/23 0813  Assess: MEWS Score  Temp 98.9 F (37.2 C)  BP (!) 210/64  Pulse Rate 73  Resp 20  Level of Consciousness Alert  O2 Device Nasal Cannula  Patient Activity (if Appropriate) In chair  O2 Flow Rate (L/min) 5 L/min  Assess: MEWS Score  MEWS Temp 0  MEWS Systolic 2  MEWS Pulse 0  MEWS RR 0  MEWS LOC 0  MEWS Score 2  MEWS Score Color Yellow  Assess: if the MEWS score is Yellow or Red  Were vital signs accurate and taken at a resting state? Yes  Does the patient meet 2 or more of the SIRS criteria? No  Does the patient have a confirmed or suspected source of infection? No  MEWS guidelines implemented  Yes, yellow  Treat  MEWS Interventions Considered administering scheduled or prn medications/treatments as ordered  Take Vital Signs  Increase Vital Sign Frequency  Yellow: Q2hr x1, continue Q4hrs until patient remains green for 12hrs  Escalate  MEWS: Escalate Yellow: Discuss with charge nurse and consider notifying provider and/or RRT  Notify: Charge Nurse/RN  Name of Charge Nurse/RN Notified Michele Rockers, RN  Provider Notification  Provider Name/Title Arrien MD  Date Provider Notified 06/02/23  Time Provider Notified 0820  Method of Notification Face-to-face  Notification Reason Other (Comment) (Yellow MEWS)  Provider response No new orders;At bedside  Assess: SIRS CRITERIA  SIRS Temperature  0  SIRS Pulse 0  SIRS Respirations  0  SIRS WBC 0  SIRS Score Sum  0

## 2023-06-02 NOTE — Interval H&P Note (Signed)
History and Physical Interval Note:  06/02/2023 12:10 PM  Gwendolyn [name]  has presented today for surgery, with the diagnosis of GI bleed.  The various methods of treatment have been discussed with the patient and family. After consideration of risks, benefits and other options for treatment, the patient has consented to  Procedure(s): COLONOSCOPY WITH PROPOFOL (N/A) as a surgical intervention.  The patient's history has been reviewed, patient examined, no change in status, stable for surgery.  I have reviewed the patient's chart and labs.  Questions were answered to the patient's satisfaction.     [name]

## 2023-06-02 NOTE — Progress Notes (Signed)
Progress Note  Patient Name: Gwendolyn [name] MRN: [id number] DOB: January 28, 1956 Date of Encounter: 06/02/2023  Attending physician: Coralie Keens Primary care provider: Ralene Ok, MD Primary Cardiologist: Dr. Jacinto Halim  Subjective: Gwendolyn [name] is a 67 y.o. Caucasian female who was seen and examined at bedside in endoscopy unit.  Shortness of breath is improving No chest pain.    Planned   Objective: Vital Signs in the last 24 hours: Temp:  [97.3 F (36.3 C)-99.6 F (37.6 C)] 98.2 F (36.8 C) (07/26 1400) Pulse Rate:  [64-80] 69 (07/26 1415) Resp:  [12-23] 20 (07/26 1415) BP: (106-224)/(50-70) 157/58 (07/26 1415) SpO2:  [92 %-98 %] 92 % (07/26 1415)  Intake/Output:  Intake/Output Summary (Last 24 hours) at 06/02/2023 1455 Last data filed at 06/02/2023 0900 Gross per 24 hour  Intake 700 ml  Output 2301 ml  Net -1601 ml    Net IO Since Admission: -6,230.84 mL [06/02/23 1455]  Weights:     06/01/2023    3:52 AM 05/31/2023    5:44 AM 05/30/2023    5:14 AM  Last 3 Weights  Weight (lbs) 147 lb 6.4 oz 150 lb 4.8 oz 151 lb 1.6 oz  Weight (kg) 66.86 kg 68.176 kg 68.539 kg      Telemetry:  Overnight telemetry shows SR, which I personally reviewed.   Physical examination: PHYSICAL EXAM: Vitals:   06/02/23 1400 06/02/23 1405 06/02/23 1410 06/02/23 1415  BP: (!) 160/57  (!) 185/61 (!) 157/58  Pulse: 66 66 67 69  Resp: (!) 21 12 12 20   Temp: 98.2 F (36.8 C)     TempSrc: Temporal     SpO2: 94% 94% 94% 92%  Weight:      Height:        Physical Exam  Constitutional: No distress.  Age appropriate, hemodynamically stable.   Neck: No JVD present.  Cardiovascular: Normal rate, regular rhythm, S1 normal and S2 normal. Exam reveals distant heart sounds. Exam reveals no gallop, no S3 and no S4.  Murmur heard. Midsystolic murmur is present with a grade of 2/6 at the upper right sternal border radiating to the neck. Decrescendo middiastolic murmur is  present with a grade of 2/4 at the upper right sternal border radiating to the apex. Pulses:      Carotid pulses are  on the right side with bruit and  on the left side with bruit.      Dorsalis pedis pulses are 0 on the right side and 0 on the left side.       Posterior tibial pulses are 0 on the right side and 0 on the left side.  Pulmonary/Chest: Effort normal. No stridor. She has diffuse wheezes.  Abdominal: Soft. Bowel sounds are normal.  Musculoskeletal:        General: No edema.     Lab Results: Chemistry Recent Labs  Lab 05/28/23 1240 05/29/23 0031 05/31/23 1349 06/01/23 0107 06/02/23 0031  NA 131*   < > 129* 131* 131*  K 3.5   < > 4.1 3.4* 3.3*  CL 102   < > 96* 95* 93*  CO2 22   < > 23 26 27   GLUCOSE 111*   < > 118* 125* 96  BUN 12   < > 41* 40* 30*  CREATININE 0.78   < > 1.56* 1.28* 1.03*  CALCIUM 8.7*   < > 9.0 8.7* 8.8*  PROT 6.6  --   --   --   --  ALBUMIN 4.0  --   --   --   --   AST 15  --   --   --   --   ALT 14  --   --   --   --   ALKPHOS 54  --   --   --   --   BILITOT 0.4  --   --   --   --   GFRNONAA >60   < > 36* 46* 60*  ANIONGAP 7   < > 10 10 11    < > = values in this interval not displayed.    Hematology Recent Labs  Lab 05/30/23 0815 05/30/23 2100 05/30/23 2359 06/01/23 0107 06/02/23 0031  WBC 12.5*  --  13.5* 12.5*  --   RBC 2.57*  --  3.80* 3.63*  --   HGB 7.1*   < > 10.7* 10.2* 10.8*  HCT 22.3*   < > 32.2* 30.4* 33.1*  MCV 86.8  --  84.7 83.7  --   MCH 27.6  --  28.2 28.1  --   MCHC 31.8  --  33.2 33.6  --   RDW 13.8  --  13.2 13.4  --   PLT 204  --  237 241  --    < > = values in this interval not displayed.   High Sensitivity Troponin:   Recent Labs  Lab 05/28/23 1420  TROPONINIHS 16     Cardiac EnzymesNo results for input(s): "TROPONINI" in the last 168 hours. No results for input(s): "TROPIPOC" in the last 168 hours.  BNP Recent Labs  Lab 05/28/23 1420 05/29/23 0031 05/30/23 0815  BNP 1,292.5* 1,993.0* 1,252.8*     DDimer No results for input(s): "DDIMER" in the last 168 hours.  Hemoglobin A1c: No results found for: "HGBA1C", "MPG" TSH  Recent Labs    05/28/23 1240  TSH 1.427   Lipid Panel  Lab Results  Component Value Date   CHOL 165 05/30/2023   HDL 27 (L) 05/30/2023   LDLCALC 122 (H) 05/30/2023   LDLDIRECT 118 (H) 05/30/2023   TRIG 82 05/30/2023   CHOLHDL 6.1 05/30/2023   Drugs of Abuse  No results found for: "LABOPIA", "COCAINSCRNUR", "LABBENZ", "AMPHETMU", "THCU", "LABBARB"    Imaging: No results found.  CARDIAC DATABASE: EKG: May 28, 2023: Sinus rhythm, 92 bpm, ST-T changes in the lateral leads consider LVH with repolarization abnormality but ischemia cannot be ruled out.    Echocardiogram 05/29/2023:     1. Left ventricular ejection fraction, by estimation, is 50 to 55%. The left ventricle has low normal function. The left ventricle has no regional wall motion abnormalities. There is moderate left ventricular hypertrophy. Left ventricular diastolic  parameters are consistent with Grade II diastolic dysfunction (pseudonormalization). Elevated left atrial pressure.  2. Right ventricular systolic function is normal. The right ventricular size is normal. Tricuspid regurgitation signal is inadequate for assessing PA pressure.  3. Left atrial size was severely dilated.  4. The mitral valve is normal in structure. No evidence of mitral valve regurgitation.  5. The aortic valve was not well visualized. Aortic valve regurgitation is mild. Moderate aortic valve stenosis. Vmax 3.0 m/s, MG , AVA 1.2 cm^2, DI 0.43  6. The inferior vena cava is dilated in size with <50% respiratory variability, suggesting right atrial pressure of 15 mmHg.   Stress Testing:  Exercise nuclear stress test 03/28/2022: Normal ECG stress. The patient exercised for 2 minutes and 59 seconds of a Bruce protocol,  achieving approximately 4.64 METs and achieved 88% MPHR. Stress terminated due to fatigue.  The  heart rate response was accelerated. Exercise capacity was severely reduced.   The baseline blood pressure was 200/70 mmHg and increased to 230/76 mmHg, which is a normal response to exercise. Myocardial perfusion is normal. The LV is mildly dilated in both rest and stress. Overall LV systolic function is mildly abnormal without regional wall motion abnormalities. Stress LV EF: 43%.  No previous exam available for comparison. Intermediate risk. Findings may suggest non ischemic cardiomyopathy.    Abdominal arteriogram and selective left renal arteriogram and lithotripsy balloon angioplasty of the suprarenal abdominal aorta using shockwave M5 catheter 02/03/2022: Abdominal aorta: 2 renal arteries 1 on either sides, mild atherosclerotic changes are present.  Abdominal aorta is diffusely diseased and calcific.  There is a high-grade 90% to 99% stenosis of the suprarenal abdominal aorta with heavy calcification. Intervention data: Successful shockwave lithotripsy using a 8.0 x 60 mm shockwave M5 balloon, gradual progression of balloon inflation pressure from 4 atmospheric pressure already up to 10 atm pressure and using a 8.43 mm lumen, followed by balloon angioplasty with a 4.0 x 60 mm Mustang balloon at 4 atmospheric pressure. Stenosis reduced from 95% to probably insignificant stenosis (<50%).  Angiogram not performed to conserve contrast and would have been poorly visualized with CO2.   Pressure gradient pre and post:  Opening right common femoral artery pressure 52/44 mmHg.  Suprarenal thoracic aortic pressure 180/58 mmHg with a mean of 103 mmHg.  Pressure gradient of approximately 130 mmHg.  Closing right femoral artery pressure 121/62 mmHg.  Pressure gradient approximately 30 mmHg post angioplasty.   Scheduled Meds:  amLODipine  10 mg Oral Daily   arformoterol  15 mcg Nebulization BID   budesonide (PULMICORT) nebulizer solution  0.25 mg Nebulization BID   cyanocobalamin  1,000 mcg Subcutaneous  Q2000   dapagliflozin propanediol  10 mg Oral Daily   ezetimibe  10 mg Oral Daily   furosemide  40 mg Oral Daily   hydrALAZINE  100 mg Oral TID   ipratropium-albuterol  3 mL Nebulization BID   labetalol  400 mg Oral BID   levothyroxine  100 mcg Oral Q0600   nicotine  21 mg Transdermal Daily   pantoprazole  40 mg Oral Daily   polyethylene glycol  17 g Oral Daily   potassium chloride  40 mEq Oral Q4H   rosuvastatin  40 mg Oral Daily   senna-docusate  1 tablet Oral QHS   sodium chloride flush  3 mL Intravenous Q12H   spironolactone  12.5 mg Oral Daily    Continuous Infusions:  iron sucrose     nitroGLYCERIN 50 mcg/min (06/02/23 1333)    PRN Meds: acetaminophen **OR** acetaminophen, albuterol, ALPRAZolam, chlorpheniramine-HYDROcodone, hydrALAZINE, ondansetron (ZOFRAN) IV, mouth rinse, oxyCODONE   IMPRESSION & RECOMMENDATIONS: Gwendolyn [name] is a 67 y.o. Caucasian female whose past medical history and cardiac risk factors include:  resistant hypertension, mixed hyperlipidemia, nicotine dependence, COPD, known distal aorta stenosis (s/p successful Abdominal arteriogram and selective left renal arteriogram and lithotripsy balloon angioplasty of the suprarenal abdominal aorta using shockwave M5 catheter 02/03/2022), chronic HFpEF, hx of hypertensive emergency.   Impression: Hypertensive emergency on admission Resistant hypertension secondary to renal artery stenosis physiology due to suprarenal aortic stenosis Acute on chronic HFpEF, stage C, NYHA class III. Aortic stenosis, moderate Acute COPD exacerbation. Recent COVID-19 infection. Cigarette smoking.. Symptomatic anemia status post blood transfusion now scheduled for repeat colonoscopy today and possible  cauterization of bleeding ulcer in the colon.   Recommendations:  Blood pressure is currently well-controlled.  I would not recommend any further uptitration of the medications in view of high-grade aortic stenosis and also  need for perfusion into the renal artery.  Heart failure symptoms have essentially resolved, COPD also contributing to her dyspnea, continued present medications including amlodipine, Farxiga, labetalol, furosemide 40 mg daily, Aldactone 12.5 mg daily as well.  With regard to aortic stenosis, she will need repeat peripheral arteriogram however in view of recent GI bleed, will wait for at least 4 to 6 hours.  Discussed with Dr. Jeani Hawking.  Will avoid aspirin or any antiplatelet agents for now.  When stable from medical standpoint, she can be discharged home, I will set up an appointment to see me back in the office in 2 to 3 weeks.  Extensive discussion with the patient regarding smoking cessation.   Yates Decamp, MD, [facility] 06/02/2023, 6:12 PM Office: [phone] Fax: [phone] Pager: [phone]

## 2023-06-02 NOTE — TOC Benefit Eligibility Note (Signed)
Pharmacy Patient Advocate Encounter  Insurance verification completed.    The patient is insured through  [employer] Part D    Ran test claim for [place]. Currently a quantity of 30 is a 30 day supply and the co-pay is $47.00 .   This test claim was processed through [facility]- copay amounts may vary at other pharmacies due to pharmacy/plan contracts, or as the patient moves through the different stages of their insurance plan.

## 2023-06-02 NOTE — Anesthesia Preprocedure Evaluation (Signed)
Anesthesia Evaluation  Patient identified by MRN, date of birth, ID band Patient awake    Reviewed: Allergy & Precautions, H&P , NPO status , Patient's Chart, lab work & pertinent test results  Airway Mallampati: II   Neck ROM: full    Dental   Pulmonary asthma , COPD, Current Smoker and Patient abstained from smoking.   breath sounds clear to auscultation       Cardiovascular hypertension, + Peripheral Vascular Disease and +CHF   Rhythm:regular Rate:Normal     Neuro/Psych  PSYCHIATRIC DISORDERS Anxiety Depression       GI/Hepatic ,GERD  ,,GI bleeding   Endo/Other  Hypothyroidism    Renal/GU stones     Musculoskeletal   Abdominal   Peds  Hematology  (+) Blood dyscrasia, anemia   Anesthesia Other Findings   Reproductive/Obstetrics                             Anesthesia Physical Anesthesia Plan  ASA: 3  Anesthesia Plan: MAC   Post-op Pain Management:    Induction: Intravenous  PONV Risk Score and Plan: 1 and Propofol infusion and Treatment may vary due to age or medical condition  Airway Management Planned: Nasal Cannula  Additional Equipment:   Intra-op Plan:   Post-operative Plan:   Informed Consent: I have reviewed the patients History and Physical, chart, labs and discussed the procedure including the risks, benefits and alternatives for the proposed anesthesia with the patient or authorized representative who has indicated his/her understanding and acceptance.     Dental advisory given  Plan Discussed with: CRNA, Anesthesiologist and Surgeon  Anesthesia Plan Comments:        Anesthesia Quick Evaluation

## 2023-06-02 NOTE — Plan of Care (Signed)
  Problem: Education: Goal: Ability to demonstrate management of disease process will improve Outcome: Progressing Goal: Ability to verbalize understanding of medication therapies will improve Outcome: Progressing   Problem: Activity: Goal: Capacity to carry out activities will improve Outcome: Progressing   Problem: Cardiac: Goal: Ability to achieve and maintain adequate cardiopulmonary perfusion will improve Outcome: Progressing   Problem: Education: Goal: Knowledge of General Education information will improve Description: Including pain rating scale, medication(s)/side effects and non-pharmacologic comfort measures Outcome: Progressing   Problem: Health Behavior/Discharge Planning: Goal: Ability to manage health-related needs will improve Outcome: Progressing   Problem: Clinical Measurements: Goal: Ability to maintain clinical measurements within normal limits will improve Outcome: Progressing Goal: Will remain free from infection Outcome: Progressing Goal: Diagnostic test results will improve Outcome: Progressing   Problem: Activity: Goal: Risk for activity intolerance will decrease Outcome: Progressing   Problem: Nutrition: Goal: Adequate nutrition will be maintained Outcome: Progressing   Problem: Coping: Goal: Level of anxiety will decrease Outcome: Progressing   Problem: Elimination: Goal: Will not experience complications related to bowel motility Outcome: Progressing Goal: Will not experience complications related to urinary retention Outcome: Progressing   Problem: Pain Managment: Goal: General experience of comfort will improve Outcome: Progressing   Problem: Safety: Goal: Ability to remain free from injury will improve Outcome: Progressing

## 2023-06-03 DIAGNOSIS — N179 Acute kidney failure, unspecified: Secondary | ICD-10-CM | POA: Diagnosis not present

## 2023-06-03 DIAGNOSIS — K922 Gastrointestinal hemorrhage, unspecified: Secondary | ICD-10-CM | POA: Diagnosis not present

## 2023-06-03 DIAGNOSIS — I160 Hypertensive urgency: Secondary | ICD-10-CM | POA: Diagnosis not present

## 2023-06-03 DIAGNOSIS — I5033 Acute on chronic diastolic (congestive) heart failure: Secondary | ICD-10-CM | POA: Diagnosis not present

## 2023-06-03 DIAGNOSIS — I5032 Chronic diastolic (congestive) heart failure: Secondary | ICD-10-CM

## 2023-06-03 LAB — BRAIN NATRIURETIC PEPTIDE: B Natriuretic Peptide: 1749.6 pg/mL — ABNORMAL HIGH (ref 0.0–100.0)

## 2023-06-03 MED ORDER — LABETALOL HCL 5 MG/ML IV SOLN
10.0000 mg | INTRAVENOUS | Status: DC | PRN
  Administered 2023-06-04: 10 mg via INTRAVENOUS

## 2023-06-03 MED ORDER — HYDRALAZINE HCL 20 MG/ML IJ SOLN
10.0000 mg | INTRAMUSCULAR | Status: DC | PRN
  Administered 2023-06-04: 10 mg via INTRAVENOUS
  Filled 2023-06-03: qty 1

## 2023-06-03 MED ORDER — CLONIDINE HCL 0.2 MG PO TABS
0.2000 mg | ORAL_TABLET | Freq: Two times a day (BID) | ORAL | Status: DC
  Administered 2023-06-04: 0.2 mg via ORAL
  Filled 2023-06-03: qty 1

## 2023-06-03 MED ORDER — CLONIDINE HCL 0.1 MG PO TABS
0.1000 mg | ORAL_TABLET | Freq: Two times a day (BID) | ORAL | Status: DC
  Administered 2023-06-03: 0.1 mg via ORAL
  Filled 2023-06-03: qty 1

## 2023-06-03 MED ORDER — LABETALOL HCL 200 MG PO TABS
300.0000 mg | ORAL_TABLET | Freq: Three times a day (TID) | ORAL | Status: DC
  Administered 2023-06-03 – 2023-06-04 (×4): 300 mg via ORAL
  Filled 2023-06-03 (×4): qty 1

## 2023-06-03 MED ORDER — SPIRONOLACTONE 25 MG PO TABS
25.0000 mg | ORAL_TABLET | Freq: Every day | ORAL | Status: DC
  Administered 2023-06-04: 25 mg via ORAL
  Filled 2023-06-03: qty 1

## 2023-06-03 MED ORDER — SPIRONOLACTONE 12.5 MG HALF TABLET
12.5000 mg | ORAL_TABLET | Freq: Once | ORAL | Status: AC
  Administered 2023-06-03: 12.5 mg via ORAL
  Filled 2023-06-03: qty 1

## 2023-06-03 MED ORDER — ISOSORBIDE MONONITRATE ER 60 MG PO TB24
120.0000 mg | ORAL_TABLET | Freq: Every day | ORAL | Status: DC
  Administered 2023-06-03 – 2023-06-04 (×2): 120 mg via ORAL
  Filled 2023-06-03 (×2): qty 2

## 2023-06-03 NOTE — Progress Notes (Signed)
OT Cancellation Note  Patient Details Name: KEIRSTEN HAMANN MRN: [id number] DOB: [birth year]   Cancelled Treatment:    Reason Eval/Treat Not Completed: Medical issues which prohibited therapy (latest BP 209/62. RN asking to hold at this time, will follow up for OT eval as schedule permits)  Carver Fila, OTD, OTR/L [facility] [phone]   Dalphine Handing 06/03/2023, 8:41 AM

## 2023-06-03 NOTE — Progress Notes (Signed)
PT Cancellation Note  Patient Details Name: Gwendolyn [name] MRN: [id number] DOB: March 07, 1956   Cancelled Treatment:    Reason Eval/Treat Not Completed: Medical issues which prohibited therapy, per OT, RN asking therapies to hold at this time as pt BP elevated 209/62. Will check back as schedule allows and pt appropriate to continue with PT POC.  Lenora Boys. PTA Acute Rehabilitation Services Office: [phone]    Catalina Antigua 06/03/2023, 10:32 AM

## 2023-06-03 NOTE — Progress Notes (Signed)
   06/03/23 0825  Assess: MEWS Score  Temp 98.5 F (36.9 C)  BP (!) 209/62  MAP (mmHg) 106  Pulse Rate 70  ECG Heart Rate 70  Resp 15  Level of Consciousness Alert  SpO2 98 %  O2 Device Nasal Cannula  O2 Flow Rate (L/min) 4 L/min  Assess: MEWS Score  MEWS Temp 0  MEWS Systolic 2  MEWS Pulse 0  MEWS RR 0  MEWS LOC 0  MEWS Score 2  MEWS Score Color Yellow  Assess: if the MEWS score is Yellow or Red  Were vital signs accurate and taken at a resting state? Yes  Does the patient meet 2 or more of the SIRS criteria? No  Does the patient have a confirmed or suspected source of infection? No  MEWS guidelines implemented  Yes, yellow  Treat  MEWS Interventions Considered administering scheduled or prn medications/treatments as ordered  Take Vital Signs  Increase Vital Sign Frequency  Yellow: Q2hr x1, continue Q4hrs until patient remains green for 12hrs  Escalate  MEWS: Escalate Yellow: Discuss with charge nurse and consider notifying provider and/or RRT  Notify: Charge Nurse/RN  Name of Charge Nurse/RN Notified Engineer, maintenance  Provider Notification  Provider Name/Title Arrien  Date Provider Notified 06/03/23  Time Provider Notified [phone]  Method of Notification Page  Notification Reason Other (Comment) (Yellow MEWS)  Provider response No new orders;Other (Comment) (MD aware)  Date of Provider Response 06/03/23  Time of Provider Response [phone]  Assess: SIRS CRITERIA  SIRS Temperature  0  SIRS Pulse 0  SIRS Respirations  0  SIRS WBC 0  SIRS Score Sum  0

## 2023-06-03 NOTE — Progress Notes (Signed)
Progress Note   Patient: Gwendolyn [name] AVW:[id number] DOB: August 20, 1956 DOA: 05/28/2023     6 DOS: the patient was seen and examined on 06/03/2023   Brief hospital course: Gwendolyn [name] was admitted to the hospital with the working diagnosis of heart failure exacerbation.   67 yo female with the past medical history of hypertension, hypothyroidism, supra renal abdominal aortic stenosis, anemia and tobacco abuse who presented with dyspnea. Recent COVID 19 infection about 3 weeks ago, treated with azithromycin, and then followed by a course of Augmentin. She endorsed new orthopnea and dyspnea, prompting her to call EMS. On her initial physical examination her blood pressure was 220/96, HR 88,  RR 23 and 02 saturation 100%, she had rales and decreased breath sounds, heart with S1 and S2 present and rhythmic, abdomen with no distention and no lower extremity edema.   Chest radiograph with cardiomegaly, bilateral hilar vascular congestion, small bilateral pleural effusions.   CT chest with bilateral small pleural effusions.  No evidence of aortic dissection or aneurysm.  Stable severe atherosclerotic disease of the abdominal aorta resulting in severe stenosis of the proximal aorta at the level of the celiac axis, superior mesenteric artery and renal arteries.  Moderate severe focal stenosis of the origins of the left subclavian and left common carotid arteries.  Moderate severe focal stenosis of the origin of the celiac artery.  Moderate severe focal stenosis of the origin of the right renal artery.   EKG 92 bpm, normal axis, normal intervals, sinus rhythm, q wave V1 and V2, no significant ST segment changes, negative T wave V5 and V6.  Patient has been placed on furosemide for diuresis.  Capsule endoscopy for occult GI bleed.  07/24 difficult to control blood pressure, required nitroglycerin drip.  07/25 capsule endoscopy with positive colonic bleeding, plan for colonoscopy today.    Assessment and Plan: * Acute on chronic diastolic CHF (congestive heart failure) (HCC) Echocardiogram with preserved LV systolic function with EF 50 to 55%. Moderate LVH. RV systolic function preserved. LA with severe dilatation. Moderate aortic stenosis. Trivial pericardial effusion.   Abdominal aortic stenosis.   Urine output is 1,750 ml Systolic blood pressure has been up to 170 to 200 mmHg.   Continue hydralazine, hydralazine,amlodipine, and labetalol. Change isosorbide to 120 mg daily.  Continue with SGLT 2 inh and spironolactone, increase to 25 mg daily. Hold on clonidine for now.   Loop diuretic with furosemide 40 mg po daily.   Acute lower GI bleeding Acute blood loss anemia.  Iron deficiency anemia.  Serum iron 17, TIBC 383, transferrin saturation 4 and ferritin 10.   Today Hgb stable at 10,8   SP 2 units PRBC transfusion.  IV iron sucrose infusion x 2 doses. Colonoscopy with no active bleeding.   Hypertensive urgency Blood pressure target less than 200 systolic.  Will continue with amlodipine and hydralazine.  Change to isosorbide 120 mg and increase spironolactone to 25 mg. Avoid clonidine for now.   For her renal artery stenosis she will benefit from ARB or ACE inh, when renal function more stable, she may able to transition to entresto.   AKI (acute kidney injury) (HCC) Hyponatremia. Hypokalemia.  Right renal artery stenosis.   Renal function stable with serum cr at 0,93, K is 3,6 and serum bicarbonate at 27, Na 130  Continue with furosemide, SGLT 2 inh and  spironolactone.   Follow up renal function and electrolytes today.   COPD (chronic obstructive pulmonary disease) (HCC) Exacerbation has improved. Continue  with bronchodilator therapy, off  systemic corticosteroids.   Mediastinal lymphadenopathy to follow up as outpatient.   Hypothyroidism Continue levothyroxine.   Hyperlipidemia Continue statin and ezetimibe.         Subjective:  Patient is feeling better, no chest pain or dyspnea, no PND or orthopnea.   Physical Exam: Vitals:   06/03/23 0530 06/03/23 0620 06/03/23 0825 06/03/23 1024  BP: (!) 173/48 (!) 198/52 (!) 209/62 (!) 172/52  Pulse:   70 63  Resp:   15   Temp:   98.5 F (36.9 C)   TempSrc:   Oral   SpO2:   98%   Weight:  66.3 kg    Height:       Neurology awake and alert ENT with mild pallor with no icterus Cardiovascular with S1 and S2 present and rhythmic with no gallops, rubs or murmurs Respiratory with no rales or wheezing, no rhonchi Abdomen with no distention  No lower extremity edema  Data Reviewed:    Family Communication: I spoke with patient's daughter at the bedside, we talked in detail about patient's condition, plan of care and prognosis and all questions were addressed.   Disposition: Status is: Inpatient Remains inpatient appropriate because: blood pressure control   Planned Discharge Destination: Home  Author: Coralie Keens, MD 06/03/2023 10:51 AM  For on call review [email or web address].

## 2023-06-03 NOTE — Anesthesia Postprocedure Evaluation (Signed)
Anesthesia Post Note  Patient: Gwendolyn [name]  Procedure(s) Performed: COLONOSCOPY WITH PROPOFOL     Patient location during evaluation: PACU Anesthesia Type: MAC Level of consciousness: awake and alert Pain management: pain level controlled Vital Signs Assessment: post-procedure vital signs reviewed and stable Respiratory status: spontaneous breathing, nonlabored ventilation, respiratory function stable and patient connected to nasal cannula oxygen Cardiovascular status: stable and blood pressure returned to baseline Postop Assessment: no apparent nausea or vomiting Anesthetic complications: no   No notable events documented.  Last Vitals:  Vitals:   06/03/23 0530 06/03/23 0620  BP: (!) 173/48 (!) 198/52  Pulse:    Resp:    Temp:    SpO2:      Last Pain:  Vitals:   06/03/23 0420  TempSrc: Oral  PainSc:                  Nuchem Grattan S

## 2023-06-03 NOTE — Progress Notes (Signed)
Subjective: No complaints.  She reports feeling better with her breathing.  Objective: Vital signs in last 24 hours: Temp:  [97.3 F (36.3 C)-99.2 F (37.3 C)] 99.2 F (37.3 C) (07/27 0420) Pulse Rate:  [64-76] 73 (07/27 0420) Resp:  [12-23] 18 (07/27 0420) BP: (157-214)/(48-64) 198/52 (07/27 0620) SpO2:  [92 %-100 %] 95 % (07/27 0420) Weight:  [66.3 kg] 66.3 kg (07/27 0620) Last BM Date : 06/02/23  Intake/Output from previous day: 07/26 0701 - 07/27 0700 In: 2646.5 [P.O.:1160; I.V.:1274.3; IV Piggyback:212.1] Out: 1750 [Urine:1750] Intake/Output this shift: No intake/output data recorded.  General appearance: alert and fatigued Resp: some decreased breath sounds in the left lung field Cardio: regular rate and rhythm GI: soft, non-tender; bowel sounds normal; no masses,  no organomegaly  Lab Results: Recent Labs    06/01/23 0107 06/02/23 0031 06/03/23 0118  WBC 12.5*  --   --   HGB 10.2* 10.8* 10.2*  HCT 30.4* 33.1* 31.1*  PLT 241  --   --    BMET Recent Labs    06/01/23 0107 06/02/23 0031 06/03/23 0118  NA 131* 131* 130*  K 3.4* 3.3* 3.6  CL 95* 93* 95*  CO2 26 27 27   GLUCOSE 125* 96 102*  BUN 40* 30* 22  CREATININE 1.28* 1.03* 0.93  CALCIUM 8.7* 8.8* 8.2*   LFT No results for input(s): "PROT", "ALBUMIN", "AST", "ALT", "ALKPHOS", "BILITOT", "BILIDIR", "IBILI" in the last 72 hours. PT/INR No results for input(s): "LABPROT", "INR" in the last 72 hours. Hepatitis Panel No results for input(s): "HEPBSAG", "HCVAB", "HEPAIGM", "HEPBIGM" in the last 72 hours. C-Diff No results for input(s): "CDIFFTOX" in the last 72 hours. Fecal Lactopherrin No results for input(s): "FECLLACTOFRN" in the last 72 hours.  Studies/Results: No results found.  Medications: Scheduled:  amLODipine  10 mg Oral Daily   arformoterol  15 mcg Nebulization BID   budesonide (PULMICORT) nebulizer solution  0.25 mg Nebulization BID   cloNIDine  0.1 mg Oral BID   cyanocobalamin   1,000 mcg Subcutaneous Q2000   dapagliflozin propanediol  10 mg Oral Daily   ezetimibe  10 mg Oral Daily   furosemide  40 mg Oral Daily   hydrALAZINE  100 mg Oral TID   ipratropium-albuterol  3 mL Nebulization BID   isosorbide dinitrate  30 mg Oral TID   labetalol  400 mg Oral BID   levothyroxine  100 mcg Oral Q0600   nicotine  21 mg Transdermal Daily   pantoprazole  40 mg Oral Daily   polyethylene glycol  17 g Oral Daily   rosuvastatin  40 mg Oral Daily   senna-docusate  1 tablet Oral QHS   sodium chloride flush  3 mL Intravenous Q12H   spironolactone  12.5 mg Oral Daily   Continuous:  Assessment/Plan: 1) Anemia. 2) HTN. 3) Suprarenal aortic stenosis. 4) COPD. 5) HFpEF.   From the GI standpoint she is stable.  No gross evidence of bleeding.  Her HGB did drift slightly downwards.  The colonoscopy with TI intubation was negative for any evidence of bleeding.  Plan: 1) Follow HGB and transfuse as necessary. 2) She will be followed peripherally.  LOS: 6 days   Tarryn Bogdan D 06/03/2023, 7:57 AM

## 2023-06-03 NOTE — Progress Notes (Signed)
Progress Note  Patient Name: Gwendolyn [name] MRN: [id number] DOB: 02/10/1956 Date of Encounter: 06/03/2023  Attending physician: Coralie Keens Primary care provider: Ralene Ok, MD Primary Cardiologist: Dr. Jacinto Halim  Subjective: Gwendolyn [name] is a 67 y.o. Caucasian female who was seen and examined at bedside  Daughter at bedside. No chest pain. Shortness of breath present but overall improved  Objective: Vital Signs in the last 24 hours: Temp:  [98.2 F (36.8 C)-99.2 F (37.3 C)] 98.5 F (36.9 C) (07/27 1210) Pulse Rate:  [63-76] 68 (07/27 1210) Resp:  [12-23] 18 (07/27 1210) BP: (157-214)/(48-62) 187/51 (07/27 1210) SpO2:  [92 %-100 %] 94 % (07/27 1210) Weight:  [66.3 kg] 66.3 kg (07/27 0620)  Intake/Output:  Intake/Output Summary (Last 24 hours) at 06/03/2023 1302 Last data filed at 06/03/2023 1246 Gross per 24 hour  Intake 2166.45 ml  Output 2150 ml  Net 16.45 ml    Net IO Since Admission: -6,224.39 mL [06/03/23 1302]  Weights:     06/03/2023    6:20 AM 06/01/2023    3:52 AM 05/31/2023    5:44 AM  Last 3 Weights  Weight (lbs) 146 lb 3.2 oz 147 lb 6.4 oz 150 lb 4.8 oz  Weight (kg) 66.316 kg 66.86 kg 68.176 kg      Telemetry:  Overnight telemetry shows SR, which I personally reviewed.   Physical examination: PHYSICAL EXAM: Vitals:   06/03/23 0620 06/03/23 0825 06/03/23 1024 06/03/23 1210  BP: (!) 198/52 (!) 209/62 (!) 172/52 (!) 187/51  Pulse:  70 63 68  Resp:  15  18  Temp:  98.5 F (36.9 C)  98.5 F (36.9 C)  TempSrc:  Oral  Oral  SpO2:  98%  94%  Weight: 66.3 kg     Height:        Physical Exam  Constitutional: No distress.  Age appropriate, hemodynamically stable.   Neck: No JVD present.  Cardiovascular: Normal rate, regular rhythm, S1 normal, S2 normal, intact distal pulses and normal pulses. Exam reveals no gallop, no S3 and no S4.  Murmur heard. Midsystolic murmur is present with a grade of 3/6 at the upper right sternal  border radiating to the neck. Decrescendo diastolic murmur is present with a grade of 3/4 at the upper right sternal border radiating to the apex. Pulses:      Carotid pulses are  on the right side with bruit and  on the left side with bruit. Pulmonary/Chest: Effort normal. No stridor. She has no rales. She has scattered wheezes.  Abdominal: Soft. Bowel sounds are normal. She exhibits no distension. There is no abdominal tenderness.  Musculoskeletal:        General: No edema.     Cervical back: Neck supple.  Neurological: She is alert and oriented to person, place, and time. She has intact cranial nerves (2-12).  Skin: Skin is warm and moist.     Lab Results: Chemistry Recent Labs  Lab 05/28/23 1240 05/29/23 0031 06/01/23 0107 06/02/23 0031 06/03/23 0118  NA 131*   < > 131* 131* 130*  K 3.5   < > 3.4* 3.3* 3.6  CL 102   < > 95* 93* 95*  CO2 22   < > 26 27 27   GLUCOSE 111*   < > 125* 96 102*  BUN 12   < > 40* 30* 22  CREATININE 0.78   < > 1.28* 1.03* 0.93  CALCIUM 8.7*   < > 8.7* 8.8* 8.2*  PROT 6.6  --   --   --   --  ALBUMIN 4.0  --   --   --   --   AST 15  --   --   --   --   ALT 14  --   --   --   --   ALKPHOS 54  --   --   --   --   BILITOT 0.4  --   --   --   --   GFRNONAA >60   < > 46* 60* >60  ANIONGAP 7   < > 10 11 8    < > = values in this interval not displayed.    Hematology Recent Labs  Lab 05/30/23 0815 05/30/23 2100 05/30/23 2359 06/01/23 0107 06/02/23 0031 06/03/23 0118  WBC 12.5*  --  13.5* 12.5*  --   --   RBC 2.57*  --  3.80* 3.63*  --   --   HGB 7.1*   < > 10.7* 10.2* 10.8* 10.2*  HCT 22.3*   < > 32.2* 30.4* 33.1* 31.1*  MCV 86.8  --  84.7 83.7  --   --   MCH 27.6  --  28.2 28.1  --   --   MCHC 31.8  --  33.2 33.6  --   --   RDW 13.8  --  13.2 13.4  --   --   PLT 204  --  237 241  --   --    < > = values in this interval not displayed.   High Sensitivity Troponin:   Recent Labs  Lab 05/28/23 1420  TROPONINIHS 16     Cardiac  EnzymesNo results for input(s): "TROPONINI" in the last 168 hours. No results for input(s): "TROPIPOC" in the last 168 hours.  BNP Recent Labs  Lab 05/29/23 0031 05/30/23 0815 06/03/23 1044  BNP 1,993.0* 1,252.8* 1,749.6*    DDimer No results for input(s): "DDIMER" in the last 168 hours.  Hemoglobin A1c: No results found for: "HGBA1C", "MPG" TSH  Recent Labs    05/28/23 1240  TSH 1.427   Lipid Panel  Lab Results  Component Value Date   CHOL 165 05/30/2023   HDL 27 (L) 05/30/2023   LDLCALC 122 (H) 05/30/2023   LDLDIRECT 118 (H) 05/30/2023   TRIG 82 05/30/2023   CHOLHDL 6.1 05/30/2023   Drugs of Abuse  No results found for: "LABOPIA", "COCAINSCRNUR", "LABBENZ", "AMPHETMU", "THCU", "LABBARB"    Imaging: No results found.  CARDIAC DATABASE: EKG: May 28, 2023: Sinus rhythm, 92 bpm, ST-T changes in the lateral leads consider LVH with repolarization abnormality but ischemia cannot be ruled out.    Echocardiogram: 02/03/2022:  1. Left ventricular ejection fraction, by estimation, is 50 to 55%. The  left ventricle has low normal function. The left ventricle has no regional  wall motion abnormalities. There is mild left ventricular hypertrophy.  Left ventricular diastolic  parameters are consistent with Grade II diastolic dysfunction  (pseudonormalization).   2. Right ventricular systolic function is normal. The right ventricular  size is normal.   3. Left atrial size was severely dilated.   4. The mitral valve is normal in structure. Mild mitral valve  regurgitation. No evidence of mitral stenosis.   5. The aortic valve is tricuspid. Aortic valve regurgitation is moderate.   6. The inferior vena cava is normal in size with <50% respiratory  variability, suggesting right atrial pressure of 8 mmHg.   Comparison(s): A prior study was performed on 09/09/2022. No significant  change.   05/29/2023:  1. Left ventricular ejection fraction, by estimation, is 50 to 55%. The   left ventricle has low normal function. The left ventricle has no regional  wall motion abnormalities. There is moderate left ventricular hypertrophy.  Left ventricular diastolic  parameters are consistent with Grade II diastolic dysfunction  (pseudonormalization). Elevated left atrial pressure.   2. Right ventricular systolic function is normal. The right ventricular  size is normal. Tricuspid regurgitation signal is inadequate for assessing  PA pressure.   3. Left atrial size was severely dilated.   4. The mitral valve is normal in structure. No evidence of mitral valve  regurgitation.   5. The aortic valve was not well visualized. Aortic valve regurgitation  is mild. Moderate aortic valve stenosis. Vmax 3.0 m/s, MG , AVA 1.2  cm^2, DI 0.43   6. The inferior vena cava is dilated in size with <50% respiratory  variability, suggesting right atrial pressure of 15 mmHg.    Stress Testing:  Exercise nuclear stress test 03/28/2022: Normal ECG stress. The patient exercised for 2 minutes and 59 seconds of a Bruce protocol, achieving approximately 4.64 METs and achieved 88% MPHR. Stress terminated due to fatigue.  The heart rate response was accelerated. Exercise capacity was severely reduced.   The baseline blood pressure was 200/70 mmHg and increased to 230/76 mmHg, which is a normal response to exercise. Myocardial perfusion is normal. The LV is mildly dilated in both rest and stress. Overall LV systolic function is mildly abnormal without regional wall motion abnormalities. Stress LV EF: 43%.  No previous exam available for comparison. Intermediate risk. Findings may suggest non ischemic cardiomyopathy.    Abdominal arteriogram and selective left renal arteriogram and lithotripsy balloon angioplasty of the suprarenal abdominal aorta using shockwave M5 catheter 02/03/2022: Abdominal aorta: 2 renal arteries 1 on either sides, mild atherosclerotic changes are present.  Abdominal aorta is  diffusely diseased and calcific.  There is a high-grade 90% to 99% stenosis of the suprarenal abdominal aorta with heavy calcification. Intervention data: Successful shockwave lithotripsy using a 8.0 x 60 mm shockwave M5 balloon, gradual progression of balloon inflation pressure from 4 atmospheric pressure already up to 10 atm pressure and using a 8.43 mm lumen, followed by balloon angioplasty with a 4.0 x 60 mm Mustang balloon at 4 atmospheric pressure. Stenosis reduced from 95% to probably insignificant stenosis (<50%).  Angiogram not performed to conserve contrast and would have been poorly visualized with CO2.   Pressure gradient pre and post:  Opening right common femoral artery pressure 52/44 mmHg.  Suprarenal thoracic aortic pressure 180/58 mmHg with a mean of 103 mmHg.  Pressure gradient of approximately 130 mmHg.  Closing right femoral artery pressure 121/62 mmHg.  Pressure gradient approximately 30 mmHg post angioplasty.  5-10 mL contrast utilized 20 to visualize abdominal aorta.  Right femoral arterial access closed with Perclose.  Recommendation: Start the patient on Plavix, I expect improvement in renal function, do not treat hypertension unless blood pressure >160 mmHg.  We will change all the antihypertensive medications.  Scheduled Meds:  amLODipine  10 mg Oral Daily   arformoterol  15 mcg Nebulization BID   budesonide (PULMICORT) nebulizer solution  0.25 mg Nebulization BID   cloNIDine  0.1 mg Oral BID   cyanocobalamin  1,000 mcg Subcutaneous Q2000   dapagliflozin propanediol  10 mg Oral Daily   ezetimibe  10 mg Oral Daily   furosemide  40 mg Oral Daily   hydrALAZINE  100 mg Oral TID   ipratropium-albuterol  3 mL Nebulization BID   isosorbide mononitrate  120 mg Oral Daily   labetalol  300 mg Oral TID   levothyroxine  100 mcg Oral Q0600   nicotine  21 mg Transdermal Daily   pantoprazole  40 mg Oral Daily   polyethylene glycol  17 g Oral Daily   rosuvastatin  40 mg Oral  Daily   senna-docusate  1 tablet Oral QHS   sodium chloride flush  3 mL Intravenous Q12H   [START ON 06/04/2023] spironolactone  25 mg Oral Daily    Continuous Infusions:    PRN Meds: acetaminophen **OR** acetaminophen, albuterol, ALPRAZolam, chlorpheniramine-HYDROcodone, hydrALAZINE, labetalol, ondansetron (ZOFRAN) IV, mouth rinse, oxyCODONE   IMPRESSION & RECOMMENDATIONS: ALYSSE HOCKENBERRY is a 67 y.o. Caucasian female whose past medical history and cardiac risk factors include:  resistant hypertension, mixed hyperlipidemia, nicotine dependence, COPD, known distal aorta stenosis (s/p successful Abdominal arteriogram and selective left renal arteriogram and lithotripsy balloon angioplasty of the suprarenal abdominal aorta using shockwave M5 catheter 02/03/2022), chronic HFpEF, hx of hypertensive emergency.   Impression: Hypertensive emergency on admission Resistant hypertension Acute kidney injury Acute on chronic HFpEF, stage C, NYHA class III. Aortic stenosis, moderate Hyperlipidemia, pure Acute COPD exacerbation. Recent COVID-19 infection. Cigarette smoking.. Symptomatic anemia status post blood transfusion Abdominal aortic stenosis status post angioplasty Renal artery stenosis Left subclavian and common carotid artery stenosis per imaging Moderate/severe celiac artery stenosis per imaging Mediastinal lymphadenopathy  Recommendations: Hypertensive emergency on admission: Resistant hypertension at baseline. Renal artery stenosis  Nitroglycerin drip has been weaned off. Blood pressure still not at goal. Patient is willing to uptitrate medical therapy. Will increase labetalol from 400 mg p.o. twice daily to 300 mg p.o. 3 times daily  Start clonidine 0.1 mg p.o. twice daily  Continue amlodipine, spironolactone, Imdur, hydralazine Farxiga. Renal function improving Hold ARB in the setting of renal artery stenosis and recent AKI. Medications reconciled. Monitor BUN and  creatinine.  AKI:  Improving Monitor BUN and Cr.  Monitor for now.   Chronic HFpEF, stage C, NYHA class II/III Improving. Clinically denies orthopnea, PND or lower extremity swelling. Medications as discussed above. Agree with up titration of spironolactone to 25 mg p.o. daily. Did not tolerate the transition from lisinopril to losartan -likely secondary to renal artery stenosis. Net IO Since Admission: -6,224.39 mL [06/03/23 1302] Will need close outpatient follow-up. Daughter has noted hypoxia while she is asleep. Recommended outpatient follow-up with pulmonary medicine for evaluation of nocturnal hypoxia versus sleep apnea.  Moderate aortic stenosis: Monitor for now. Need longitudinal follow-up as outpatient  Acute COPD exacerbation: Recent COVID-19 infection. Cigarette smoking. COPD and post-COVID management per primary team. Breathing has significantly improved.  Reemphasized importance of complete smoking cessation.  Hyperlipidemia: Currently on rosuvastatin. Most recent lipid profile reviewed. LDL still not at goal despite being on high intensity statin therapy. Started Zetia 10 mg p.o. daily during this hospitalization Consider PCSK9 inhibitors as outpatient given the degree of vascular disease. LP(a) <8.4 (nml)  Symptomatic anemia status post PRBCs.   Undergoing anemia workup with primary and GI. Underwent capsule endoscopy as well as colonoscopy during hospitalization  Abdominal aortic stenosis status post angioplasty Renal artery stenosis Left subclavian and common carotid artery stenosis per imaging Moderate/severe celiac artery stenosis per imaging After acute hospitalization recommend reestablishing care with Dr. Jacinto Halim to discuss long-term management. Clinically she has not lost any significant weight and does not have postprandial symptoms.   She also does not complain of pain with overhead activities.  Blood pressure management as discussed above Aspirin  held off secondary to anemia. Lipid management as discussed above Avoid over correcting her blood pressures /hypotension as she may become more symptomatic given the degree of stenosis in her vascular tree.  Mediastinal lymphadenopathy: Defer to primary team.  Noted on CTA of the chest abdomen pelvis.  Would recommend further workup given her prolonged history of smoking.  Patient's questions and concerns were addressed to her satisfaction. She voices understanding of the instructions provided during this encounter.   This note was created using a voice recognition software as a result there may be grammatical errors inadvertently enclosed that do not reflect the nature of this encounter. Every attempt is made to correct such errors.  Delilah Shan [facility]  Pager:  [phone] Office: [phone] 06/03/2023, 1:02 PM

## 2023-06-03 NOTE — Plan of Care (Signed)
  Problem: Education: Goal: Ability to demonstrate management of disease process will improve Outcome: Progressing Goal: Ability to verbalize understanding of medication therapies will improve Outcome: Progressing   Problem: Activity: Goal: Capacity to carry out activities will improve Outcome: Progressing   Problem: Education: Goal: Knowledge of General Education information will improve Description: Including pain rating scale, medication(s)/side effects and non-pharmacologic comfort measures Outcome: Progressing   Problem: Health Behavior/Discharge Planning: Goal: Ability to manage health-related needs will improve Outcome: Progressing   Problem: Clinical Measurements: Goal: Ability to maintain clinical measurements within normal limits will improve Outcome: Progressing Goal: Will remain free from infection Outcome: Progressing

## 2023-06-04 DIAGNOSIS — I5033 Acute on chronic diastolic (congestive) heart failure: Secondary | ICD-10-CM | POA: Diagnosis not present

## 2023-06-04 DIAGNOSIS — K922 Gastrointestinal hemorrhage, unspecified: Secondary | ICD-10-CM | POA: Diagnosis not present

## 2023-06-04 DIAGNOSIS — I160 Hypertensive urgency: Secondary | ICD-10-CM | POA: Diagnosis not present

## 2023-06-04 DIAGNOSIS — D649 Anemia, unspecified: Secondary | ICD-10-CM

## 2023-06-04 DIAGNOSIS — F17210 Nicotine dependence, cigarettes, uncomplicated: Secondary | ICD-10-CM

## 2023-06-04 DIAGNOSIS — I5032 Chronic diastolic (congestive) heart failure: Secondary | ICD-10-CM

## 2023-06-04 DIAGNOSIS — N179 Acute kidney failure, unspecified: Secondary | ICD-10-CM | POA: Diagnosis not present

## 2023-06-04 LAB — BASIC METABOLIC PANEL
Anion gap: 6 (ref 5–15)
BUN: 17 mg/dL (ref 8–23)
CO2: 29 mmol/L (ref 22–32)
Calcium: 8.4 mg/dL — ABNORMAL LOW (ref 8.9–10.3)
Chloride: 96 mmol/L — ABNORMAL LOW (ref 98–111)
Creatinine, Ser: 0.82 mg/dL (ref 0.44–1.00)
GFR, Estimated: >60 mL/min (ref >60)
Glucose, Bld: 121 mg/dL — ABNORMAL HIGH (ref 70–99)
Potassium: 3.3 mmol/L — ABNORMAL LOW (ref 3.5–5.1)
Sodium: 131 mmol/L — ABNORMAL LOW (ref 135–145)

## 2023-06-04 MED ORDER — HYDRALAZINE HCL 100 MG PO TABS: 100.0000 mg | ORAL_TABLET | Freq: Three times a day (TID) | ORAL | 0 refills | Status: AC

## 2023-06-04 MED ORDER — LABETALOL HCL 300 MG PO TABS: 300.0000 mg | ORAL_TABLET | Freq: Three times a day (TID) | ORAL | Status: DC

## 2023-06-04 MED ORDER — ALPRAZOLAM 0.5 MG PO TABS: 0.5000 mg | ORAL_TABLET | Freq: Three times a day (TID) | ORAL | 0 refills | Status: DC | PRN

## 2023-06-04 MED ORDER — FUROSEMIDE 40 MG PO TABS: 40.0000 mg | ORAL_TABLET | Freq: Every day | ORAL | 0 refills | Status: DC

## 2023-06-04 MED ORDER — SPIRONOLACTONE 25 MG PO TABS: 25.0000 mg | ORAL_TABLET | Freq: Every day | ORAL | 0 refills | Status: DC

## 2023-06-04 MED ORDER — DAPAGLIFLOZIN PROPANEDIOL 10 MG PO TABS: 10.0000 mg | ORAL_TABLET | Freq: Every day | ORAL | 0 refills | Status: DC

## 2023-06-04 MED ORDER — CLONIDINE HCL 0.2 MG PO TABS: 0.2000 mg | ORAL_TABLET | Freq: Two times a day (BID) | ORAL | 0 refills | Status: AC

## 2023-06-04 MED ORDER — EZETIMIBE 10 MG PO TABS: 10.0000 mg | ORAL_TABLET | Freq: Every day | ORAL | 0 refills | Status: DC

## 2023-06-04 MED ORDER — POTASSIUM CHLORIDE CRYS ER 20 MEQ PO TBCR
40.0000 meq | EXTENDED_RELEASE_TABLET | ORAL | Status: DC
  Filled 2023-06-04: qty 2

## 2023-06-04 MED ORDER — ISOSORBIDE MONONITRATE ER 120 MG PO TB24: 120.0000 mg | ORAL_TABLET | Freq: Every day | ORAL | 0 refills | Status: DC

## 2023-06-04 NOTE — Plan of Care (Signed)

## 2023-06-04 NOTE — Discharge Summary (Addendum)
Physician Discharge Summary   Patient: Gwendolyn [name] MRN: [id number] DOB: 1956/03/30  Admit date:     05/28/2023  Discharge date: 06/04/23  Discharge Physician: York Ram Sharri Loya   PCP: Ralene Ok, MD   Recommendations at discharge:    Blood pressure regimen has been adjusted, hydralazine increased to 100 mg tid, isosorbide increased to 120 mg daily, increased furosemide to 40 mg and added spironolactone 25 mg daily. Added clonidine 0.2 mg bid. Continue with amlodipine. Holding ARB for now due to recovering renal failure.  Check blood pressure at home and keep a log.  Added alprazolam as needed for anxiety.  Follow mediastinal lymphadenopathy as outpatient.  Follow up renal function and electrolytes in 10 days as outpatient.  Follow up with Dr Ludwig Clarks in 7 to 10 days. Follow up with Cardiology as scheduled.   Discharge Diagnoses: Principal Problem:   Acute on chronic diastolic CHF (congestive heart failure) (HCC) Active Problems:   Acute lower GI bleeding   Hypertensive urgency   AKI (acute kidney injury) (HCC)   COPD (chronic obstructive pulmonary disease) (HCC)   Hypothyroidism   Hyperlipidemia   Chronic heart failure with preserved ejection fraction (HFpEF) (HCC)  Resolved Problems:   * No resolved hospital problems. [facility] Course: Gwendolyn [name] was admitted to the hospital with the working diagnosis of heart failure exacerbation.   67 yo female with the past medical history of hypertension, hypothyroidism, supra renal abdominal aortic stenosis, anemia and tobacco abuse who presented with dyspnea. Recent COVID 19 infection about 3 weeks ago, treated with azithromycin, and then followed by a course of Augmentin. She endorsed new orthopnea and dyspnea, prompting her to call EMS. On her initial physical examination her blood pressure was 220/96, HR 88,  RR 23 and 02 saturation 100%, she had rales and decreased breath sounds, heart with S1 and S2 present and  rhythmic, abdomen with no distention and no lower extremity edema.   Chest radiograph with cardiomegaly, bilateral hilar vascular congestion, small bilateral pleural effusions.   CT chest with bilateral small pleural effusions.  No evidence of aortic dissection or aneurysm.  Stable severe atherosclerotic disease of the abdominal aorta resulting in severe stenosis of the proximal aorta at the level of the celiac axis, superior mesenteric artery and renal arteries.  Moderate severe focal stenosis of the origins of the left subclavian and left common carotid arteries.  Moderate severe focal stenosis of the origin of the celiac artery.  Moderate severe focal stenosis of the origin of the right renal artery.  Mediastinal lymphadenopathy of uncertain etiology.   EKG 92 bpm, normal axis, normal intervals, sinus rhythm, q wave V1 and V2, no significant ST segment changes, negative T wave V5 and V6.  Patient has been placed on furosemide for diuresis.  Capsule endoscopy for occult GI bleed.  07/24 difficult to control blood pressure, required nitroglycerin drip.  07/25 capsule endoscopy with positive colonic bleeding, plan for colonoscopy today.  07/26 no further sings of bleeding, colonoscopy negative.  Further titration of blood pressure regimen.  07/28 blood pressure with better controlled, on close to maximal medical therapy. Plan to follow up as outpatient.   Assessment and Plan: * Acute on chronic diastolic CHF (congestive heart failure) (HCC) Echocardiogram with preserved LV systolic function with EF 50 to 55%. Moderate LVH. RV systolic function preserved. LA with severe dilatation. Moderate aortic stenosis. Trivial pericardial effusion.   Abdominal aortic stenosis.   Urine output is 1,750 ml Systolic blood pressure has  been up to 170 to 200 mmHg.   Continue hydralazine, hydralazine,amlodipine, and labetalol. Change isosorbide to 120 mg daily.  Continue with SGLT 2 inh and  spironolactone, increase to 25 mg daily. Hold on clonidine for now.   Loop diuretic with furosemide 40 mg po daily.   Acute lower GI bleeding Acute blood loss anemia.  Iron deficiency anemia.  Serum iron 17, TIBC 383, transferrin saturation 4 and ferritin 10.   Today Hgb stable at 10,8   SP 2 units PRBC transfusion.  IV iron sucrose infusion x 2 doses. Colonoscopy with no active bleeding.   Hypertensive urgency Blood pressure target less than 200 systolic.  Will continue with amlodipine and hydralazine.  Change to isosorbide 120 mg and increase spironolactone to 25 mg. Avoid clonidine for now.   For her renal artery stenosis she will benefit from ARB or ACE inh, when renal function more stable, she may able to transition to entresto.   AKI (acute kidney injury) (HCC) Hyponatremia. Hypokalemia.  Right renal artery stenosis with severe atherosclerosis of the aorta, subclavian,  and celiac mesenteric.  Renal function stable with serum cr at 0,93, K is 3,6 and serum bicarbonate at 27, Na 130  Continue with furosemide, SGLT 2 inh and  spironolactone.   Follow up renal function and electrolytes today.   COPD (chronic obstructive pulmonary disease) (HCC) Exacerbation has improved. Continue with bronchodilator therapy, off  systemic corticosteroids.   Mediastinal lymphadenopathy to follow up as outpatient.   Tobacco abuse, smoking cessation counseling.   Hypothyroidism Continue levothyroxine.   Hyperlipidemia Continue statin and ezetimibe.        Consultants: cardiology  Procedures performed: none   Disposition: Home Diet recommendation:  Discharge Diet Orders (From admission, onward)     Start     Ordered   06/04/23 0000  Diet - low sodium heart healthy        06/04/23 1423           Cardiac diet DISCHARGE MEDICATION: Allergies as of 06/04/2023       Reactions   Doxycycline Anaphylaxis, Rash   Azilsartan Cough   Biaxin [clarithromycin] Diarrhea, Other  (See Comments)   Vertigo   Floxacillin [floxacillin (flucloxacillin)] Other (See Comments)   Vertigo   Morphine And Codeine Other (See Comments)   Headache        Medication List     STOP taking these medications    losartan 100 MG tablet Commonly known as: COZAAR       TAKE these medications    acetaminophen 500 MG tablet Commonly known as: TYLENOL Take 500 mg by mouth 2 (two) times daily as needed for headache or moderate pain.   ALPRAZolam 0.5 MG tablet Commonly known as: XANAX Take 1 tablet (0.5 mg total) by mouth 3 (three) times daily as needed for anxiety.   amLODipine 5 MG tablet Commonly known as: NORVASC Take 2 tablets (10 mg total) by mouth daily. What changed: when to take this   Ayr 0.65 % nasal spray Generic drug: sodium chloride Place 1 spray into the nose daily as needed for congestion.   Breztri Aerosphere 160-9-4.8 MCG/ACT Aero Generic drug: Budeson-Glycopyrrol-Formoterol Inhale 2 puffs into the lungs daily as needed (SOB).   cloNIDine 0.2 MG tablet Commonly known as: CATAPRES Take 1 tablet (0.2 mg total) by mouth 2 (two) times daily.   cyanocobalamin 1000 MCG tablet Commonly known as: VITAMIN B12 Take 1 tablet (1,000 mcg total) by mouth daily.   dapagliflozin propanediol  10 MG Tabs tablet Commonly known as: FARXIGA Take 1 tablet (10 mg total) by mouth daily. Start taking on: June 05, 2023   ezetimibe 10 MG tablet Commonly known as: ZETIA Take 1 tablet (10 mg total) by mouth daily. Start taking on: June 05, 2023   ferrous sulfate 325 (65 FE) MG tablet Take 1 tablet (325 mg total) by mouth daily before breakfast.   furosemide 40 MG tablet Commonly known as: LASIX Take 1 tablet (40 mg total) by mouth daily. Start taking on: June 05, 2023 What changed:  medication strength how much to take   hydrALAZINE 100 MG tablet Commonly known as: APRESOLINE Take 1 tablet (100 mg total) by mouth 3 (three) times daily. What changed:   medication strength how much to take   HYDROcodone-acetaminophen 5-325 MG tablet Commonly known as: NORCO/VICODIN Take 0.5 tablets by mouth 2 (two) times daily as needed for moderate pain.   isosorbide mononitrate 120 MG 24 hr tablet Commonly known as: IMDUR Take 1 tablet (120 mg total) by mouth daily. Start taking on: June 05, 2023   labetalol 300 MG tablet Commonly known as: NORMODYNE Take 1 tablet (300 mg total) by mouth 3 (three) times daily. What changed:  medication strength how much to take when to take this   levothyroxine 100 MCG tablet Commonly known as: SYNTHROID Take 100 mcg by mouth daily before breakfast. What changed: Another medication with the same name was removed. Continue taking this medication, and follow the directions you see here.   pantoprazole 40 MG tablet Commonly known as: PROTONIX Take 1 tablet by mouth 2 times daily before a meal for 30 days, THEN take1 tablet daily. Start taking on: October 21, 2022 What changed: See the new instructions.   rosuvastatin 40 MG tablet Commonly known as: CRESTOR Take 40 mg by mouth daily.   spironolactone 25 MG tablet Commonly known as: ALDACTONE Take 1 tablet (25 mg total) by mouth daily. Start taking on: June 05, 2023        Discharge Exam: Ceasar Mons Weights   06/01/23 0352 06/03/23 0620 06/04/23 0428  Weight: 66.9 kg 66.3 kg 66.6 kg   BP (!) 175/48 (BP Location: Right Arm)   Pulse 72   Temp 98.8 F (37.1 C) (Oral)   Resp 20   Ht 5\' 1"  (1.549 m)   Wt 66.6 kg   LMP  (LMP Unknown)   SpO2 95%   BMI 27.76 kg/m   Patient with no chest pain or dyspnea, no edema.   Neurology awake and alert ENT with mild pallor Cardiovascular with S1 and S2 present and regular with no gallops, rubs or gallops. Respiratory with no rales or wheezing, no rhonchi Abdomen with no distention  No lower extremity edema   Condition at discharge: stable  The results of significant diagnostics from this  hospitalization (including imaging, microbiology, ancillary and laboratory) are listed below for reference.   Imaging Studies: DG CHEST PORT 1 VIEW  Result Date: 05/31/2023 CLINICAL DATA:  Shortness of breath. EXAM: PORTABLE CHEST 1 VIEW COMPARISON:  May 28, 2023. FINDINGS: Stable cardiomegaly. Bilateral pulmonary edema is noted with bilateral pleural effusions which are mildly enlarged. Bony thorax unremarkable. IMPRESSION: Bilateral pulmonary edema and small bilateral pleural effusions which are increased compared to prior exam. Electronically Signed   By: Lupita Raider M.D.   On: 05/31/2023 08:32   VAS US CAROTID  Result Date: 05/29/2023 Carotid Arterial Duplex Study Patient Name:  Gwendolyn [name]  Date of  Exam:   05/29/2023 Medical Rec #: [id number]           Accession #:    [id number] Date of Birth: [birth year]          Patient Gender: F Patient Age:   46 years Exam Location:  [facility] Procedure:      VAS US CAROTID Referring Phys: Tessa Lerner --------------------------------------------------------------------------------  Indications:       Bilateral bruits. Risk Factors:      Hypertension, hyperlipidemia, current smoker, PAD. Other Factors:     COPD, severe atherosclerotic calcification resulting in sever                    stenosis of the aorta and focal stenosis of the celiac and                    origin of the right renal artery. Comparison Study:  No prior study on file Performing Technologist: Sherren Kerns RVS  Examination Guidelines: A complete evaluation includes B-mode imaging, spectral Doppler, color Doppler, and power Doppler as needed of all accessible portions of each vessel. Bilateral testing is considered an integral part of a complete examination. Limited examinations for reoccurring indications may be performed as noted.  Right Carotid Findings: +----------+--------+--------+--------+------------------+--------+           PSV cm/sEDV cm/sStenosisPlaque  DescriptionComments +----------+--------+--------+--------+------------------+--------+ CCA Prox  148     23              heterogenous               +----------+--------+--------+--------+------------------+--------+ CCA Distal133     20              heterogenous               +----------+--------+--------+--------+------------------+--------+ ICA Prox  192     31      1-39%   heterogenous               +----------+--------+--------+--------+------------------+--------+ ICA Mid   210     35                                         +----------+--------+--------+--------+------------------+--------+ ICA Distal118     29                                tortuous +----------+--------+--------+--------+------------------+--------+ ECA       190     6                                          +----------+--------+--------+--------+------------------+--------+ +----------+--------+-------+---------+-------------------+           PSV cm/sEDV cmsDescribe Arm Pressure (mmHG) +----------+--------+-------+---------+-------------------+ [id number]            Turbulent                    +----------+--------+-------+---------+-------------------+ +---------+--------+---+--------+--+--------+ VertebralPSV cm/s172EDV cm/s23Atypical +---------+--------+---+--------+--+--------+  Left Carotid Findings: +----------+--------+--------+--------+------------------+------------------+           PSV cm/sEDV cm/sStenosisPlaque DescriptionComments           +----------+--------+--------+--------+------------------+------------------+ CCA Prox  131     10  intimal thickening +----------+--------+--------+--------+------------------+------------------+ CCA Distal107     17                                intimal thickening +----------+--------+--------+--------+------------------+------------------+ ICA Prox  192     26      1-39%    calcific                             +----------+--------+--------+--------+------------------+------------------+ ICA Mid   168     27                                                   +----------+--------+--------+--------+------------------+------------------+ ICA Distal78      16                                tortuous           +----------+--------+--------+--------+------------------+------------------+ ECA       360                                                          +----------+--------+--------+--------+------------------+------------------+ +----------+--------+--------+--------+-------------------+           PSV cm/sEDV cm/sDescribeArm Pressure (mmHG) +----------+--------+--------+--------+-------------------+ [id number]                                         +----------+--------+--------+--------+-------------------+ +---------+--------+---+--------+--+ VertebralPSV cm/s105EDV cm/s15 +---------+--------+---+--------+--+   Summary: Right Carotid: Velocities in the right ICA are consistent with a 1-39% stenosis. Left Carotid: Velocities in the left ICA are consistent with a 1-39% stenosis. Vertebrals:  Bilateral vertebral arteries demonstrate antegrade flow. Subclavians: Right subclavian artery flow was disturbed. Normal flow              hemodynamics were seen in the left subclavian artery. *See table(s) above for measurements and observations.  Electronically signed by Delia Heady MD on 05/29/2023 at 1:53:06 PM.    Final    ECHOCARDIOGRAM COMPLETE  Result Date: 05/29/2023    ECHOCARDIOGRAM REPORT   Patient Name:   Gwendolyn [name] Date of Exam: 05/29/2023 Medical Rec #:  [id number]          Height:       61.0 in Accession #:    [id number]         Weight:       155.0 lb Date of Birth:  [birth year]         BSA:          1.695 m Patient Age:    66 years           BP:           159/72 mmHg Patient Gender: F                  HR:           73 bpm. Exam  Location:  Inpatient Procedure: 2D Echo, Color Doppler and Cardiac Doppler Indications:  I50.31 Acute diastolic (congestive) heart failure  History:        Patient has prior history of Echocardiogram examinations, most                 recent 02/05/2022. CHF, COPD; Risk Factors:Hypertension and                 Dyslipidemia.  Sonographer:    Irving Burton Senior RDCS Referring Phys: [phone] RONDELL A SMITH  Sonographer Comments: Suboptimal apical window due to COPD, wall motion obtained from short axis. IMPRESSIONS  1. Left ventricular ejection fraction, by estimation, is 50 to 55%. The left ventricle has low normal function. The left ventricle has no regional wall motion abnormalities. There is moderate left ventricular hypertrophy. Left ventricular diastolic parameters are consistent with Grade II diastolic dysfunction (pseudonormalization). Elevated left atrial pressure.  2. Right ventricular systolic function is normal. The right ventricular size is normal. Tricuspid regurgitation signal is inadequate for assessing PA pressure.  3. Left atrial size was severely dilated.  4. The mitral valve is normal in structure. No evidence of mitral valve regurgitation.  5. The aortic valve was not well visualized. Aortic valve regurgitation is mild. Moderate aortic valve stenosis. Vmax 3.0 m/s, MG , AVA 1.2 cm^2, DI 0.43  6. The inferior vena cava is dilated in size with <50% respiratory variability, suggesting right atrial pressure of 15 mmHg. FINDINGS  Left Ventricle: Left ventricular ejection fraction, by estimation, is 50 to 55%. The left ventricle has low normal function. The left ventricle has no regional wall motion abnormalities. The left ventricular internal cavity size was normal in size. There is moderate left ventricular hypertrophy. Left ventricular diastolic parameters are consistent with Grade II diastolic dysfunction (pseudonormalization). Elevated left atrial pressure. Right Ventricle: The right ventricular size  is normal. No increase in right ventricular wall thickness. Right ventricular systolic function is normal. Tricuspid regurgitation signal is inadequate for assessing PA pressure. Left Atrium: Left atrial size was severely dilated. Right Atrium: Right atrial size was normal in size. Pericardium: Trivial pericardial effusion is present. Mitral Valve: The mitral valve is normal in structure. No evidence of mitral valve regurgitation. Tricuspid Valve: The tricuspid valve is normal in structure. Tricuspid valve regurgitation is trivial. Aortic Valve: The aortic valve was not well visualized. Aortic valve regurgitation is mild. Aortic regurgitation PHT measures 350 msec. Moderate aortic stenosis is present. Aortic valve mean gradient measures 18.0 mmHg. Aortic valve peak gradient measures 36.0 mmHg. Aortic valve area, by VTI measures 1.18 cm. Pulmonic Valve: The pulmonic valve was not well visualized. Pulmonic valve regurgitation is trivial. Aorta: The aortic root and ascending aorta are structurally normal, with no evidence of dilitation. Venous: The inferior vena cava is dilated in size with less than 50% respiratory variability, suggesting right atrial pressure of 15 mmHg. IAS/Shunts: The interatrial septum was not well visualized.  LEFT VENTRICLE PLAX 2D LVIDd:         5.10 cm   Diastology LVIDs:         3.70 cm   LV e' medial:    5.44 cm/s LV PW:         1.50 cm   LV E/e' medial:  19.5 LV IVS:        1.20 cm   LV e' lateral:   4.24 cm/s LVOT diam:     1.90 cm   LV E/e' lateral: 25.0 LV SV:         72 LV SV Index:   43 LVOT  Area:     2.84 cm  RIGHT VENTRICLE RV S prime:     13.50 cm/s TAPSE (M-mode): 2.4 cm LEFT ATRIUM             Index        RIGHT ATRIUM           Index LA diam:        4.40 cm 2.60 cm/m   RA Area:     16.90 cm LA Vol (A2C):   94.7 ml 55.88 ml/m  RA Volume:   41.20 ml  24.31 ml/m LA Vol (A4C):   84.7 ml 49.98 ml/m LA Biplane Vol: 92.8 ml 54.76 ml/m  AORTIC VALVE AV Area (Vmax):    1.22 cm AV  Area (Vmean):   1.18 cm AV Area (VTI):     1.18 cm AV Vmax:           300.00 cm/s AV Vmean:          194.000 cm/s AV VTI:            0.612 m AV Peak Grad:      36.0 mmHg AV Mean Grad:      18.0 mmHg LVOT Vmax:         129.00 cm/s LVOT Vmean:        80.800 cm/s LVOT VTI:          0.255 m LVOT/AV VTI ratio: 0.42 AI PHT:            350 msec  AORTA Ao Root diam: 2.90 cm Ao Asc diam:  3.30 cm MITRAL VALVE MV Area (PHT): 3.31 cm     SHUNTS MV Decel Time: 229 msec     Systemic VTI:  0.26 m MV E velocity: 106.00 cm/s  Systemic Diam: 1.90 cm MV A velocity: 94.40 cm/s MV E/A ratio:  1.12 Epifanio Lesches MD Electronically signed by Epifanio Lesches MD Signature Date/Time: 05/29/2023/1:16:29 PM    Final    CT Angio Chest/Abd/Pel for Dissection W and/or W/WO  Result Date: 05/28/2023 CLINICAL DATA:  Acute aortic syndrome. EXAM: CT ANGIOGRAPHY CHEST, ABDOMEN AND PELVIS TECHNIQUE: Non-contrast CT of the chest was initially obtained. Multidetector CT imaging through the chest, abdomen and pelvis was performed using the standard protocol during bolus administration of intravenous contrast. Multiplanar reconstructed images and MIPs were obtained and reviewed to evaluate the vascular anatomy. RADIATION DOSE REDUCTION: This exam was performed according to the departmental dose-optimization program which includes automated exposure control, adjustment of the mA and/or kV according to patient size and/or use of iterative reconstruction technique. CONTRAST:  OMNIPAQUE IOHEXOL 350 MG/ML SOLN COMPARISON:  CT abdomen and pelvis 08/27/2021 FINDINGS: CTA CHEST FINDINGS Cardiovascular: Preferential opacification of the thoracic aorta. No evidence of thoracic aortic aneurysm or dissection. Heart is mildly enlarged. There are moderate calcified atherosclerotic plaque throughout the aorta. Moderate severe focal stenosis identified of the origins of the left subclavian and left common carotid artery secondary to calcified  atherosclerotic disease. No pericardial effusion. Mediastinum/Nodes: There are enlarged paratracheal lymph nodes measuring up to 12 mm short axis. There are enlarged prevascular lymph nodes measuring up to 10 mm short axis. Enlarged subcarinal lymph node measures 15 mm short axis. Esophagus is within normal limits. The thyroid gland is not well seen. Lungs/Pleura: There are small bilateral pleural effusions with compressive atelectasis in the bilateral lower lobes. There is also small amount of atelectasis in the lingula. No pneumothorax. Trachea and central airways are patent. Musculoskeletal: No chest  wall abnormality. No acute or significant osseous findings. Review of the MIP images confirms the above findings. CTA ABDOMEN AND PELVIS FINDINGS VASCULAR Aorta: Severe atherosclerotic calcifications are again seen. This results in severe stenosis of the aorta in the proximal abdominal aorta at the level of the celiac axis, superior mesenteric arteries and renal arteries. Infrarenal abdominal aorta is nondilated. There is no evidence for dissection. Celiac: There is moderate severe focal stenosis of the origin of the celiac artery. The celiac artery and branches are otherwise within normal limits. SMA: Patent without evidence of aneurysm, dissection, vasculitis or significant stenosis. Renals: Moderate severe focal stenosis of the origin of the right renal artery. Left renal artery within normal limits. Both arteries are patent. No aneurysm or thrombosis. IMA: Patent without evidence of aneurysm, dissection, vasculitis or significant stenosis. Inflow: Patent without evidence of aneurysm, dissection, vasculitis or significant stenosis. Mild calcified atherosclerotic disease present. Veins: No obvious venous abnormality within the limitations of this arterial phase study. Review of the MIP images confirms the above findings. NON-VASCULAR Hepatobiliary: No focal liver abnormality is seen. Status post cholecystectomy. No  biliary dilatation. Pancreas: Unremarkable. No pancreatic ductal dilatation or surrounding inflammatory changes. Spleen: Normal in size without focal abnormality. Adrenals/Urinary Tract: Bilateral renal calculi are present including right inferior pole renal calyceal calculus measuring 13 x 10 by 6 mm. There is no hydronephrosis or perinephric fat stranding. The adrenal glands are within normal limits. The bladder is within normal limits. Stomach/Bowel: Stomach is within normal limits. Appendix is not seen. No evidence of bowel wall thickening, distention, or inflammatory changes. Lymphatic: There are nonenlarged upper abdominal and retroperitoneal lymph nodes similar to prior. Reproductive: Status post hysterectomy. No adnexal masses. Other: There is trace free fluid in the pelvis. Musculoskeletal: No fracture is seen. Review of the MIP images confirms the above findings. IMPRESSION: 1. No evidence for aortic dissection or aneurysm. 2. Stable severe atherosclerotic disease of the abdominal aorta resulting in severe stenosis of the proximal abdominal aorta at the level of the celiac axis, superior mesenteric artery and renal arteries. 3. Moderate severe focal stenosis of the origins of the left subclavian and left common carotid arteries. 4. Moderate severe focal stenosis of the origin of the celiac artery. 5. Moderate severe focal stenosis of the origin of the right renal artery. 6. Small bilateral pleural effusions with compressive atelectasis. 7. Mediastinal lymphadenopathy of uncertain etiology. 8. Trace free fluid in the pelvis. 9. Nonobstructing bilateral renal calculi. Electronically Signed   By: Darliss Cheney M.D.   On: 05/28/2023 22:39   DG Chest Port 1 View  Result Date: 05/28/2023 CLINICAL DATA:  Shortness of breath. EXAM: PORTABLE CHEST 1 VIEW COMPARISON:  10/18/2022 FINDINGS: Aortic atherosclerotic calcifications. Stable cardiac enlargement. Low lung volumes. Small bilateral pleural effusions and  mild interstitial edema. Scar versus atelectasis in the lateral left base. IMPRESSION: Cardiomegaly, small bilateral pleural effusions and mild interstitial edema. Correlate for any signs or symptoms of CHF. Electronically Signed   By: Signa Kell M.D.   On: 05/28/2023 13:03    Microbiology: Results for orders placed or performed during the hospital encounter of 05/28/23  Respiratory (~20 pathogens) panel by PCR     Status: None   Collection Time: 05/28/23  6:38 PM   Specimen: Nasopharyngeal Swab; Respiratory  Result Value Ref Range Status   Adenovirus NOT DETECTED NOT DETECTED Final   Coronavirus 229E NOT DETECTED NOT DETECTED Final    Comment: (NOTE) The Coronavirus on the Respiratory Panel, DOES NOT  test for the novel  Coronavirus (2019 nCoV)    Coronavirus HKU1 NOT DETECTED NOT DETECTED Final   Coronavirus NL63 NOT DETECTED NOT DETECTED Final   Coronavirus OC43 NOT DETECTED NOT DETECTED Final   Metapneumovirus NOT DETECTED NOT DETECTED Final   Rhinovirus / Enterovirus NOT DETECTED NOT DETECTED Final   Influenza A NOT DETECTED NOT DETECTED Final   Influenza B NOT DETECTED NOT DETECTED Final   Parainfluenza Virus 1 NOT DETECTED NOT DETECTED Final   Parainfluenza Virus 2 NOT DETECTED NOT DETECTED Final   Parainfluenza Virus 3 NOT DETECTED NOT DETECTED Final   Parainfluenza Virus 4 NOT DETECTED NOT DETECTED Final   Respiratory Syncytial Virus NOT DETECTED NOT DETECTED Final   Bordetella pertussis NOT DETECTED NOT DETECTED Final   Bordetella Parapertussis NOT DETECTED NOT DETECTED Final   Chlamydophila pneumoniae NOT DETECTED NOT DETECTED Final   Mycoplasma pneumoniae NOT DETECTED NOT DETECTED Final    Comment: Performed at [facility], [address], [place], [place] [address]    Labs: CBC: Recent Labs  Lab 05/29/23 0031 05/30/23 0134 05/30/23 0815 05/30/23 2100 05/30/23 2359 06/01/23 0107 06/02/23 0031 06/03/23 0118  WBC 7.0 10.3 12.5*  --  13.5* 12.5*  --    --   NEUTROABS  --  8.8*  --   --  12.4*  --   --   --   HGB 9.0* 7.1* 7.1* 11.2* 10.7* 10.2* 10.8* 10.2*  HCT 27.8* 22.2* 22.3* 33.3* 32.2* 30.4* 33.1* 31.1*  MCV 86.1 86.7 86.8  --  84.7 83.7  --   --   PLT 247 217 204  --  237 241  --   --    Basic Metabolic Panel: Recent Labs  Lab 05/31/23 1349 06/01/23 0107 06/02/23 0031 06/03/23 0118 06/04/23 1008  NA 129* 131* 131* 130* 131*  K 4.1 3.4* 3.3* 3.6 3.3*  CL 96* 95* 93* 95* 96*  CO2 23 26 27 27 29   GLUCOSE 118* 125* 96 102* 121*  BUN 41* 40* 30* 22 17  CREATININE 1.56* 1.28* 1.03* 0.93 0.82  CALCIUM 9.0 8.7* 8.8* 8.2* 8.4*   Liver Function Tests: No results for input(s): "AST", "ALT", "ALKPHOS", "BILITOT", "PROT", "ALBUMIN" in the last 168 hours. CBG: No results for input(s): "GLUCAP" in the last 168 hours.  Discharge time spent: greater than 30 minutes.  Signed: Coralie Keens, MD [facility] 06/04/2023

## 2023-06-04 NOTE — Progress Notes (Signed)
Approximately 1630-- Pt discharged to home. At time of discharge, pt A&O x 4 and VSS. No complaints of pain at this time. AVS discharge instructions provided to pt and daughter, at bedside, with all questions answered at this time. All PIVs removed. Telemetry disconnected and notified. Pt transported home with daughter via personal car.

## 2023-06-04 NOTE — Progress Notes (Signed)
Progress Note  Patient Name: Gwendolyn [name] MRN: [id number] DOB: 10/16/1956 Date of Encounter: 06/04/2023  Attending physician: Coralie Keens Primary care provider: Ralene Ok, MD Primary Cardiologist: Dr. Jacinto Halim  Subjective: Gwendolyn [name] is a 67 y.o. Caucasian female who was seen and examined at bedside  Daughter present at bedside. Denies anginal chest pain or heart failure symptoms. Not on nasal cannula oxygen. Eating lunch Wishes to go home.  Objective: Vital Signs in the last 24 hours: Temp:  [98.2 F (36.8 C)-99.2 F (37.3 C)] 98.8 F (37.1 C) (07/28 1044) Pulse Rate:  [61-72] 72 (07/28 1044) Resp:  [17-20] 20 (07/28 1044) BP: (175-215)/(46-59) 175/48 (07/28 1044) SpO2:  [90 %-100 %] 95 % (07/28 1044) Weight:  [66.6 kg] 66.6 kg (07/28 0428)  Intake/Output:  Intake/Output Summary (Last 24 hours) at 06/04/2023 1425 Last data filed at 06/04/2023 1200 Gross per 24 hour  Intake 600 ml  Output 1300 ml  Net -700 ml    Net IO Since Admission: -6,404.39 mL [06/04/23 1425]  Weights:     06/04/2023    4:28 AM 06/03/2023    6:20 AM 06/01/2023    3:52 AM  Last 3 Weights  Weight (lbs) 146 lb 14.4 oz 146 lb 3.2 oz 147 lb 6.4 oz  Weight (kg) 66.633 kg 66.316 kg 66.86 kg      Telemetry:  Overnight telemetry shows SR, which I personally reviewed.   Physical examination: PHYSICAL EXAM: Vitals:   06/04/23 0737 06/04/23 0834 06/04/23 0923 06/04/23 1044  BP: (!) 215/50  (!) 214/50 (!) 175/48  Pulse: 70 64 68 72  Resp: 20  17 20   Temp: 99.2 F (37.3 C)   98.8 F (37.1 C)  TempSrc: Oral   Oral  SpO2: 91%   95%  Weight:      Height:        Physical Exam  Constitutional: No distress.  Age appropriate, hemodynamically stable.   Neck: No JVD present.  Cardiovascular: Normal rate, regular rhythm, S1 normal, S2 normal, intact distal pulses and normal pulses. Exam reveals no gallop, no S3 and no S4.  Murmur heard. Midsystolic murmur is present with  a grade of 3/6 at the upper right sternal border radiating to the neck. Decrescendo diastolic murmur is present with a grade of 3/4 at the upper right sternal border radiating to the apex. Pulses:      Carotid pulses are  on the right side with bruit and  on the left side with bruit. Pulmonary/Chest: Effort normal. No stridor. She has no wheezes. She has no rales.  Abdominal: Soft. Bowel sounds are normal. She exhibits no distension. There is no abdominal tenderness.  Musculoskeletal:        General: No edema.     Cervical back: Neck supple.  Neurological: She is alert and oriented to person, place, and time. She has intact cranial nerves (2-12).  Skin: Skin is warm and moist.     Lab Results: Chemistry Recent Labs  Lab 06/02/23 0031 06/03/23 0118 06/04/23 1008  NA 131* 130* 131*  K 3.3* 3.6 3.3*  CL 93* 95* 96*  CO2 27 27 29   GLUCOSE 96 102* 121*  BUN 30* 22 17  CREATININE 1.03* 0.93 0.82  CALCIUM 8.8* 8.2* 8.4*  GFRNONAA 60* >60 >60  ANIONGAP 11 8 6     Hematology Recent Labs  Lab 05/30/23 0815 05/30/23 2100 05/30/23 2359 06/01/23 0107 06/02/23 0031 06/03/23 0118  WBC 12.5*  --  13.5* 12.5*  --   --  RBC 2.57*  --  3.80* 3.63*  --   --   HGB 7.1*   < > 10.7* 10.2* 10.8* 10.2*  HCT 22.3*   < > 32.2* 30.4* 33.1* 31.1*  MCV 86.8  --  84.7 83.7  --   --   MCH 27.6  --  28.2 28.1  --   --   MCHC 31.8  --  33.2 33.6  --   --   RDW 13.8  --  13.2 13.4  --   --   PLT 204  --  237 241  --   --    < > = values in this interval not displayed.   High Sensitivity Troponin:   Recent Labs  Lab 05/28/23 1420  TROPONINIHS 16     Cardiac EnzymesNo results for input(s): "TROPONINI" in the last 168 hours. No results for input(s): "TROPIPOC" in the last 168 hours.  BNP Recent Labs  Lab 05/29/23 0031 05/30/23 0815 06/03/23 1044  BNP 1,993.0* 1,252.8* 1,749.6*    DDimer No results for input(s): "DDIMER" in the last 168 hours.  Hemoglobin A1c: No results found for:  "HGBA1C", "MPG" TSH  Recent Labs    05/28/23 1240  TSH 1.427   Lipid Panel  Lab Results  Component Value Date   CHOL 165 05/30/2023   HDL 27 (L) 05/30/2023   LDLCALC 122 (H) 05/30/2023   LDLDIRECT 118 (H) 05/30/2023   TRIG 82 05/30/2023   CHOLHDL 6.1 05/30/2023   Drugs of Abuse  No results found for: "LABOPIA", "COCAINSCRNUR", "LABBENZ", "AMPHETMU", "THCU", "LABBARB"    Imaging: CTA chest abdomen pelvis dissection protocol: May 28, 2023: 1. No evidence for aortic dissection or aneurysm. 2. Stable severe atherosclerotic disease of the abdominal aorta resulting in severe stenosis of the proximal abdominal aorta at the level of the celiac axis, superior mesenteric artery and renal arteries. 3. Moderate severe focal stenosis of the origins of the left subclavian and left common carotid arteries. 4. Moderate severe focal stenosis of the origin of the celiac artery. 5. Moderate severe focal stenosis of the origin of the right renal artery. 6. Small bilateral pleural effusions with compressive atelectasis. 7. Mediastinal lymphadenopathy of uncertain etiology. 8. Trace free fluid in the pelvis. 9. Nonobstructing bilateral renal calculi.  CARDIAC DATABASE: EKG: May 28, 2023: Sinus rhythm, 92 bpm, ST-T changes in the lateral leads consider LVH with repolarization abnormality but ischemia cannot be ruled out.    Echocardiogram: 02/03/2022:  1. Left ventricular ejection fraction, by estimation, is 50 to 55%. The  left ventricle has low normal function. The left ventricle has no regional  wall motion abnormalities. There is mild left ventricular hypertrophy.  Left ventricular diastolic  parameters are consistent with Grade II diastolic dysfunction  (pseudonormalization).   2. Right ventricular systolic function is normal. The right ventricular  size is normal.   3. Left atrial size was severely dilated.   4. The mitral valve is normal in structure. Mild mitral valve   regurgitation. No evidence of mitral stenosis.   5. The aortic valve is tricuspid. Aortic valve regurgitation is moderate.   6. The inferior vena cava is normal in size with <50% respiratory  variability, suggesting right atrial pressure of 8 mmHg.   Comparison(s): A prior study was performed on 09/09/2022. No significant  change.   05/29/2023:  1. Left ventricular ejection fraction, by estimation, is 50 to 55%. The  left ventricle has low normal function. The left ventricle has no regional  wall motion abnormalities. There is moderate left ventricular hypertrophy.  Left ventricular diastolic  parameters are consistent with Grade II diastolic dysfunction  (pseudonormalization). Elevated left atrial pressure.   2. Right ventricular systolic function is normal. The right ventricular  size is normal. Tricuspid regurgitation signal is inadequate for assessing  PA pressure.   3. Left atrial size was severely dilated.   4. The mitral valve is normal in structure. No evidence of mitral valve  regurgitation.   5. The aortic valve was not well visualized. Aortic valve regurgitation  is mild. Moderate aortic valve stenosis. Vmax 3.0 m/s, MG , AVA 1.2  cm^2, DI 0.43   6. The inferior vena cava is dilated in size with <50% respiratory  variability, suggesting right atrial pressure of 15 mmHg.    Stress Testing:  Exercise nuclear stress test 03/28/2022: Normal ECG stress. The patient exercised for 2 minutes and 59 seconds of a Bruce protocol, achieving approximately 4.64 METs and achieved 88% MPHR. Stress terminated due to fatigue.  The heart rate response was accelerated. Exercise capacity was severely reduced.   The baseline blood pressure was 200/70 mmHg and increased to 230/76 mmHg, which is a normal response to exercise. Myocardial perfusion is normal. The LV is mildly dilated in both rest and stress. Overall LV systolic function is mildly abnormal without regional wall motion  abnormalities. Stress LV EF: 43%.  No previous exam available for comparison. Intermediate risk. Findings may suggest non ischemic cardiomyopathy.    Abdominal arteriogram and selective left renal arteriogram and lithotripsy balloon angioplasty of the suprarenal abdominal aorta using shockwave M5 catheter 02/03/2022: Abdominal aorta: 2 renal arteries 1 on either sides, mild atherosclerotic changes are present.  Abdominal aorta is diffusely diseased and calcific.  There is a high-grade 90% to 99% stenosis of the suprarenal abdominal aorta with heavy calcification. Intervention data: Successful shockwave lithotripsy using a 8.0 x 60 mm shockwave M5 balloon, gradual progression of balloon inflation pressure from 4 atmospheric pressure already up to 10 atm pressure and using a 8.43 mm lumen, followed by balloon angioplasty with a 4.0 x 60 mm Mustang balloon at 4 atmospheric pressure. Stenosis reduced from 95% to probably insignificant stenosis (<50%).  Angiogram not performed to conserve contrast and would have been poorly visualized with CO2.   Pressure gradient pre and post:  Opening right common femoral artery pressure 52/44 mmHg.  Suprarenal thoracic aortic pressure 180/58 mmHg with a mean of 103 mmHg.  Pressure gradient of approximately 130 mmHg.  Closing right femoral artery pressure 121/62 mmHg.  Pressure gradient approximately 30 mmHg post angioplasty.  5-10 mL contrast utilized 20 to visualize abdominal aorta.  Right femoral arterial access closed with Perclose.  Suprarenal abdominal aortic stenosis angioplasty 02/03/2022:   Bilateral renal arteries widely patent with mild atherosclerosis.  Suprarenal abdominal aorta severely calcific and stenosed by about 95%.    Stenosis reduced from 95% to probably insignificant stenosis (<50%).  Angiogram not performed to conserve contrast and would have been poorly visualized with CO2.   Pressure gradient pre and post angioplasty:  Opening right common  femoral artery pressure 52/44 mmHg.  Suprarenal thoracic aortic pressure 180/58 mmHg with a mean of 103 mmHg.  Pressure gradient of approximately 130 mmHg.  Closing right femoral artery pressure 121/62 mmHg.  Pressure gradient approximately 30 mmHg post angioplasty.    SUPRA RENAL THORACIC PRESSURE  RIGHT COMMON FEMORAL ARTERY PRESSURE PRE.  POST LITHOTRYPSY SHOCKWAVE ANGIOPLASTY  CLOSING PRESSURE IN THE RIGHT COMMON FEMORAL ARTERY  STENOSIS SEVERITY OF THE SUPRARENAL ABDOMINAL AORTA  SHOCKWAVE BALLOON GRADUAL EXPANSION WITH PULSE ULTRASOUND DELIVERY     FINAL 12X60 MM MUSTANG BALLOON ANGIOPLASTY.  Recommendation: Start the patient on Plavix, I expect improvement in renal function, do not treat hypertension unless blood pressure >160 mmHg.  We will change all the antihypertensive medications.  Scheduled Meds:  amLODipine  10 mg Oral Daily   arformoterol  15 mcg Nebulization BID   budesonide (PULMICORT) nebulizer solution  0.25 mg Nebulization BID   cloNIDine  0.2 mg Oral BID   dapagliflozin propanediol  10 mg Oral Daily   ezetimibe  10 mg Oral Daily   furosemide  40 mg Oral Daily   hydrALAZINE  100 mg Oral TID   ipratropium-albuterol  3 mL Nebulization BID   isosorbide mononitrate  120 mg Oral Daily   labetalol  300 mg Oral TID   levothyroxine  100 mcg Oral Q0600   nicotine  21 mg Transdermal Daily   pantoprazole  40 mg Oral Daily   polyethylene glycol  17 g Oral Daily   potassium chloride  40 mEq Oral Q4H   rosuvastatin  40 mg Oral Daily   senna-docusate  1 tablet Oral QHS   sodium chloride flush  3 mL Intravenous Q12H   spironolactone  25 mg Oral Daily    Continuous Infusions:    PRN Meds: acetaminophen **OR** acetaminophen, albuterol, ALPRAZolam, chlorpheniramine-HYDROcodone, hydrALAZINE, labetalol, ondansetron (ZOFRAN) IV, mouth rinse, oxyCODONE   IMPRESSION & RECOMMENDATIONS: Gwendolyn [name] is a 67 y.o. Caucasian female whose past medical history and  cardiac risk factors include:  resistant hypertension, mixed hyperlipidemia, nicotine dependence, COPD, known distal aorta stenosis (s/p successful Abdominal arteriogram and selective left renal arteriogram and lithotripsy balloon angioplasty of the suprarenal abdominal aorta using shockwave M5 catheter 02/03/2022), chronic HFpEF, hx of hypertensive emergency.   Impression: Resistant hypertension Acute kidney injury - resolved.  Acute on chronic HFpEF, stage C, NYHA class II - compensated.  Aortic stenosis, moderate Hyperlipidemia, pure Acute / Chronic COPD exacerbation. Recent COVID-19 infection. Cigarette smoking.. Symptomatic anemia status post blood transfusion Abdominal aortic stenosis status post angioplasty Renal artery stenosis Left subclavian and common carotid artery stenosis per imaging Moderate/severe celiac artery stenosis per imaging Mediastinal lymphadenopathy  Recommendations: Resistant hypertension at baseline. Renal artery stenosis  Nitroglycerin drip has been weaned off. Has done well with uptitration of labetalol to 300 mg p.o. 3 times daily. Clonidine increased to 0.2 mg p.o. twice daily. Continue amlodipine, spironolactone, Imdur, hydralazine, Farxiga With ARB renal function worsened-hold off for now.  Can be reevaluated as outpatient  Patient is made aware of symptoms of aortic dissection and if present she is asked to go to the closest ER via EMS as this can be a life-threatening complication of uncontrolled hypertension.  Patient and daughter verbalized understanding. Reemphasized outpatient follow-up with Dr. Jacinto Halim, primary cardiologist.  AKI:  Resolved. Avoid nephrotoxic agents and hypotension  Chronic HFpEF, stage C, NYHA class II Euvolemic on physical examination.   Has diuresed well, -6.4 L  Discharge weight is 66.6 kg  Did not tolerate the transition from lisinopril to losartan -likely secondary to renal artery stenosis. Will need close outpatient  follow-up. Daughter has noted hypoxia while she is asleep. Recommended outpatient follow-up with pulmonary medicine for evaluation of nocturnal hypoxia &  sleep apnea.  Moderate aortic stenosis: Monitor for now. Need longitudinal follow-up as outpatient  Acute COPD exacerbation: Recent COVID-19 infection. Cigarette smoking. No longer requiring nasal cannula oxygen. No wheezing  on physical examination. Management per primary team. Reemphasized importance of complete smoking cessation.  Hyperlipidemia: Currently on rosuvastatin. Most recent lipid profile reviewed. LDL still not at goal despite being on high intensity statin therapy. Continue  Zetia 10 mg p.o. daily during this hospitalization Consider PCSK9 inhibitors as outpatient given the degree of vascular disease. LP(a) <8.4 (nml)  Symptomatic anemia status post PRBCs.   Undergoing anemia workup with primary and GI. Underwent capsule endoscopy as well as colonoscopy during hospitalization  Abdominal aortic stenosis status post angioplasty Renal artery stenosis Left subclavian and common carotid artery stenosis per imaging Moderate/severe celiac artery stenosis per imaging After acute hospitalization recommend reestablishing care with Dr. Jacinto Halim to discuss long-term management. Clinically she has not lost any significant weight and does not have postprandial symptoms.   She also does not complain of pain with overhead activities.   Blood pressure management as discussed above Aspirin held off secondary to anemia. Lipid management as discussed above Avoid over correcting her blood pressures /hypotension as she may become more symptomatic given the degree of stenosis in her vascular tree.  Mediastinal lymphadenopathy: Defer to primary team.  Noted on CTA of the chest abdomen pelvis.  Would recommend further workup given her prolonged history of smoking.  Patient's questions and concerns were addressed to her satisfaction. She  voices understanding of the instructions provided during this encounter.   This note was created using a voice recognition software as a result there may be grammatical errors inadvertently enclosed that do not reflect the nature of this encounter. Every attempt is made to correct such errors.  Tessa Lerner, [place], [facility]  Pager:  [phone] Office: [phone] 06/04/2023, 2:25 PM

## 2023-06-04 NOTE — Progress Notes (Signed)
   06/04/23 0737  Assess: MEWS Score  Temp 99.2 F (37.3 C)  BP (!) 215/50  MAP (mmHg) 94  Pulse Rate 70  ECG Heart Rate 71  Resp 20  SpO2 91 %  O2 Device Room Air  Assess: MEWS Score  MEWS Temp 0  MEWS Systolic 2  MEWS Pulse 0  MEWS RR 0  MEWS LOC 0  MEWS Score 2  MEWS Score Color Yellow  Assess: if the MEWS score is Yellow or Red  Were vital signs accurate and taken at a resting state? Yes  Does the patient meet 2 or more of the SIRS criteria? No  Does the patient have a confirmed or suspected source of infection? No  MEWS guidelines implemented  Yes, yellow  Treat  MEWS Interventions Considered administering scheduled or prn medications/treatments as ordered  Take Vital Signs  Increase Vital Sign Frequency  Yellow: Q2hr x1, continue Q4hrs until patient remains green for 12hrs  Escalate  MEWS: Escalate Yellow: Discuss with charge nurse and consider notifying provider and/or RRT  Notify: Charge Nurse/RN  Name of Charge Nurse/RN Notified Lafonda Mosses, RN  Provider Notification  Provider Name/Title Arrien MD  Date Provider Notified 06/04/23  Time Provider Notified 0800  Method of Notification Face-to-face  Notification Reason Other (Comment) (Yellow MEWS)  Provider response No new orders  Assess: SIRS CRITERIA  SIRS Temperature  0  SIRS Pulse 0  SIRS Respirations  0  SIRS WBC 0  SIRS Score Sum  0

## 2023-06-04 NOTE — Evaluation (Signed)
Occupational Therapy Evaluation and Discharge Patient Details Name: Gwendolyn [name] MRN: [id number] DOB: [birth year] Today's Date: 06/04/2023   History of Present Illness 67 yo female presents to [facility] on 7/24 with HF exacerbation. Recent covid infection. PMH includes: HTN, HLD, current tobacco use, severe aortic stenosis, COPD, HFpEF, and asthma.   Clinical Impression   Pt admitted with the above diagnoses. At baseline, pt is independent with ADLs; works in an office. Pt tolerated OOB activity well on eval on RA. Pt appears to be at baseline with ADLs. Will d/c from OT services.        Recommendations for follow up therapy are one component of a multi-disciplinary discharge planning process, led by the attending physician.  Recommendations may be updated based on patient status, additional functional criteria and insurance authorization.   Assistance Recommended at Discharge None  Patient can return home with the following      Functional Status Assessment  Patient has had a recent decline in their functional status and demonstrates the ability to make significant improvements in function in a reasonable and predictable amount of time.  Equipment Recommendations  None recommended by OT    Recommendations for Other Services       Precautions / Restrictions Precautions Precautions: Fall Restrictions Weight Bearing Restrictions: No      Mobility Bed Mobility Overal bed mobility: Independent                  Transfers Overall transfer level: Modified independent Equipment used: None                      Balance Overall balance assessment: Mild deficits observed, not formally tested                                         ADL either performed or assessed with clinical judgement   ADL Overall ADL's : At baseline                                       General ADL Comments: walked in the hall a bit, no AD or assist  needed. tolerated OOB activity well.     Vision Baseline Vision/History: 1 Wears glasses       Perception     Praxis      Pertinent Vitals/Pain Pain Assessment Pain Assessment: No/denies pain     Hand Dominance Right   Extremity/Trunk Assessment Upper Extremity Assessment Upper Extremity Assessment: Overall WFL for tasks assessed   Lower Extremity Assessment Lower Extremity Assessment: Defer to PT evaluation   Cervical / Trunk Assessment Cervical / Trunk Assessment: Kyphotic   Communication Communication Communication: No difficulties   Cognition Arousal/Alertness: Awake/alert Behavior During Therapy: WFL for tasks assessed/performed, Anxious Overall Cognitive Status: Within Functional Limits for tasks assessed                                       General Comments  RA throughout session. Tolerated OOB activty well, no SOB.    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        OT Problem List:        OT Treatment/Interventions:      OT Goals(Current goals can be found in the care plan section) Acute Rehab OT Goals Patient Stated Goal: home, return to work  OT Frequency:      Co-evaluation              AM-PAC OT "6 Clicks" Daily Activity     Outcome Measure Help from another person eating meals?: None Help from another person taking care of personal grooming?:  None Help from another person toileting, which includes using toliet, bedpan, or urinal?: None Help from another person bathing (including washing, rinsing, drying)?: None Help from another person to put on and taking off regular upper body clothing?: None Help from another person to put on and taking off regular lower body clothing?: None 6 Click Score: 24   End of Session    Activity Tolerance: Patient tolerated treatment well Patient left: in bed;with call bell/phone within reach;with family/visitor present  OT Visit Diagnosis: Unsteadiness on feet (R26.81)                Time: 1324-4010 OT Time Calculation (min): 10 min Charges:  OT General Charges $OT Visit: 1 Visit OT Evaluation $OT Eval Low Complexity: 1 Low  Raynald Kemp, OT Acute Rehabilitation Services Office: [phone]   Pilar Grammes 06/04/2023, 3:19 PM

## 2023-06-04 NOTE — Plan of Care (Signed)
  Problem: Education: Goal: Ability to demonstrate management of disease process will improve 06/04/2023 0401 by Colon Flattery, RN Outcome: Progressing 06/04/2023 0401 by Colon Flattery, RN Outcome: Progressing

## 2023-06-05 ENCOUNTER — Encounter (HOSPITAL_COMMUNITY): Payer: Self-pay | Admitting: Gastroenterology

## 2023-06-19 ENCOUNTER — Other Ambulatory Visit (HOSPITAL_COMMUNITY): Payer: Self-pay

## 2023-06-21 ENCOUNTER — Ambulatory Visit: Payer: Medicare Other | Admitting: Cardiology

## 2023-06-21 ENCOUNTER — Encounter: Payer: Self-pay | Admitting: Cardiology

## 2023-06-21 VITALS — BP 189/57 | HR 71 | Resp 16 | Ht 61.0 in | Wt 144.0 lb

## 2023-06-21 DIAGNOSIS — I1A0 Resistant hypertension: Secondary | ICD-10-CM

## 2023-06-21 DIAGNOSIS — E7800 Pure hypercholesterolemia, unspecified: Secondary | ICD-10-CM

## 2023-06-21 DIAGNOSIS — I739 Peripheral vascular disease, unspecified: Secondary | ICD-10-CM

## 2023-06-21 DIAGNOSIS — Q251 Coarctation of aorta: Secondary | ICD-10-CM

## 2023-06-21 MED ORDER — SPIRONOLACTONE 25 MG PO TABS
25.0000 mg | ORAL_TABLET | Freq: Every day | ORAL | 1 refills | Status: DC
Start: 2023-06-21 — End: 2023-07-12

## 2023-06-21 MED ORDER — LABETALOL HCL 300 MG PO TABS
300.0000 mg | ORAL_TABLET | Freq: Three times a day (TID) | ORAL | 1 refills | Status: DC
Start: 2023-06-21 — End: 2023-07-12

## 2023-06-21 MED ORDER — EZETIMIBE 10 MG PO TABS
10.0000 mg | ORAL_TABLET | Freq: Every day | ORAL | 3 refills | Status: DC
Start: 2023-06-21 — End: 2023-07-12

## 2023-06-21 MED ORDER — ISOSORBIDE DINITRATE 30 MG PO TABS: 30.0000 mg | ORAL_TABLET | Freq: Three times a day (TID) | ORAL | 2 refills | Status: DC

## 2023-06-21 NOTE — Progress Notes (Signed)
Primary Physician/Referring:  Ralene Ok, MD  Patient ID: Gwendolyn [name], female    DOB: 11-16-1955, 67 y.o.   MRN: [id number]  Chief Complaint  Patient presents with   Abdominal aortic stenosis   Resistant htn   HPI:    Gwendolyn [name]  is a 67 y.o. Caucasian female patient with longstanding history of hypertension, hyperlipidemia, tobacco use disorder, COPD, staghorn renal calculus, severe abdominal aortic stenosis S/P intravascular lithotripsy on 3/20/2023and renal artery stenosis along with mild colitis.  Admitted to [facility] on 05/28/2023 with hypertensive urgency, lower GI bleed, COPD exacerbation and discharged home now presents for follow-up office visit.  Except for chronic dyspnea she has no specific complaints, she denies symptoms of TIA or CVA.  Past Medical History:  Diagnosis Date   Anemia    Anxiety    Asthma    COPD (chronic obstructive pulmonary disease) (HCC)    Depression    GERD (gastroesophageal reflux disease)    History of kidney stones    Hypertension    Hypothyroidism    Past Surgical History:  Procedure Laterality Date   ABDOMINAL AORTOGRAM W/LOWER EXTREMITY N/A 02/03/2022   Procedure: ABDOMINAL AORTOGRAM W/LOWER EXTREMITY;  Surgeon: Yates Decamp, MD;  Location: [facility] INVASIVE CV LAB;  Service: Cardiovascular;  Laterality: N/A;   ABDOMINAL HYSTERECTOMY     BIOPSY  10/21/2022   Procedure: BIOPSY;  Surgeon: Jeani Hawking, MD;  Location: [facility] ENDOSCOPY;  Service: Gastroenterology;;   Cesarean Section     (2)   CHOLECYSTECTOMY     COLONOSCOPY  2020   COLONOSCOPY WITH PROPOFOL N/A 10/21/2022   Procedure: COLONOSCOPY WITH PROPOFOL;  Surgeon: Jeani Hawking, MD;  Location: [facility] ENDOSCOPY;  Service: Gastroenterology;  Laterality: N/A;   COLONOSCOPY WITH PROPOFOL N/A 06/02/2023   Procedure: COLONOSCOPY WITH PROPOFOL;  Surgeon: Jeani Hawking, MD;  Location: [facility] ENDOSCOPY;  Service: Gastroenterology;  Laterality: N/A;   ESOPHAGOGASTRODUODENOSCOPY  (EGD) WITH PROPOFOL N/A 10/21/2022   Procedure: ESOPHAGOGASTRODUODENOSCOPY (EGD) WITH PROPOFOL;  Surgeon: Jeani Hawking, MD;  Location: [facility] ENDOSCOPY;  Service: Gastroenterology;  Laterality: N/A;   Gastric Ulcer  2020   GIVENS CAPSULE STUDY N/A 05/31/2023   Procedure: GIVENS CAPSULE STUDY;  Surgeon: Jeani Hawking, MD;  Location: [facility] ENDOSCOPY;  Service: Gastroenterology;  Laterality: N/A;   PERIPHERAL VASCULAR BALLOON ANGIOPLASTY  02/03/2022   Procedure: PERIPHERAL VASCULAR BALLOON ANGIOPLASTY;  Surgeon: Yates Decamp, MD;  Location: [facility] INVASIVE CV LAB;  Service: Cardiovascular;;   POLYPECTOMY  10/21/2022   Procedure: POLYPECTOMY;  Surgeon: Jeani Hawking, MD;  Location: [facility] ENDOSCOPY;  Service: Gastroenterology;;   RENAL ANGIOGRAPHY N/A 02/03/2022   Procedure: RENAL ANGIOGRAPHY;  Surgeon: Yates Decamp, MD;  Location: [facility] INVASIVE CV LAB;  Service: Cardiovascular;  Laterality: N/A;   UMBILICAL HERNIA REPAIR N/A 10/29/2020   Procedure: OPEN UMBILICAL HERNIA REPAIR WITH MESH;  Surgeon: Stechschulte, Hyman Hopes, MD;  Location: [facility] OR;  Service: General;  Laterality: N/A;   Family History  Problem Relation Age of Onset   Colon cancer Mother    Diabetes Father    Diabetes Sister    Breast cancer Sister 23   Hypertension Sister     Social History   Tobacco Use   Smoking status: Every Day    Current packs/day: 1.00    Average packs/day: 1 pack/day for 40.0 years (40.0 ttl pk-yrs)    Types: Cigarettes   Smokeless tobacco: Never  Substance Use Topics   Alcohol use: Never   Marital Status: Widowed  ROS  Review  of Systems  Cardiovascular:  Positive for claudication (bilateral thigh and calf) and orthopnea. Negative for chest pain, leg swelling, near-syncope, paroxysmal nocturnal dyspnea and syncope.  Respiratory:  Positive for cough and shortness of breath.   Gastrointestinal:  Negative for melena.   Objective  Blood pressure (!) 189/57, pulse 71, resp. rate 16, height 5\' 1"  (1.549 m), weight 144 lb  (65.3 kg), SpO2 95%. Body mass index is 27.21 kg/m.     06/21/2023   10:30 AM 06/04/2023    1:40 PM 06/04/2023   10:44 AM  Vitals with BMI  Height 5\' 1"     Weight 144 lbs    BMI 27.22    Systolic 189 191 161  Diastolic 57 56 48  Pulse 71  72    Physical Exam Vitals reviewed.  Constitutional:      Appearance: She is obese.  Neck:     Vascular: Carotid bruit (bilateral) present. No JVD.  Cardiovascular:     Rate and Rhythm: Regular rhythm. Tachycardia present.     Pulses:          Radial pulses are 2+ on the right side and 2+ on the left side.       Femoral pulses are 0 on the right side and 0 on the left side.      Popliteal pulses are 0 on the right side and 0 on the left side.       Dorsalis pedis pulses are 0 on the right side and 0 on the left side.       Posterior tibial pulses are 0 on the right side and 0 on the left side.     Heart sounds: Normal heart sounds. No murmur heard.    No gallop.  Pulmonary:     Effort: Pulmonary effort is normal.     Breath sounds: Wheezing (diffuse bilateral) present.  Abdominal:     General: Bowel sounds are normal.     Palpations: Abdomen is soft.     Comments: Bruit  Musculoskeletal:     Right lower leg: No edema.     Left lower leg: No edema.  Skin:    Capillary Refill: Capillary refill takes less than 2 seconds.      Laboratory examination:   Recent Labs    06/02/23 0031 06/03/23 0118 06/04/23 1008  NA 131* 130* 131*  K 3.3* 3.6 3.3*  CL 93* 95* 96*  CO2 27 27 29   GLUCOSE 96 102* 121*  BUN 30* 22 17  CREATININE 1.03* 0.93 0.82  CALCIUM 8.8* 8.2* 8.4*  GFRNONAA 60* >60 >60   estimated creatinine clearance is 57.6 mL/min (by C-G formula based on SCr of 0.82 mg/dL).     Latest Ref Rng & Units 06/04/2023   10:08 AM 06/03/2023    1:18 AM 06/02/2023   12:31 AM  CMP  Glucose 70 - 99 mg/dL 096  045  96   BUN 8 - 23 mg/dL 17  22  30    Creatinine 0.44 - 1.00 mg/dL 4.09  8.11  9.14   Sodium 135 - 145 mmol/L 131  130   131   Potassium 3.5 - 5.1 mmol/L 3.3  3.6  3.3   Chloride 98 - 111 mmol/L 96  95  93   CO2 22 - 32 mmol/L 29  27  27    Calcium 8.9 - 10.3 mg/dL 8.4  8.2  8.8       Latest Ref Rng & Units 06/03/2023  1:18 AM 06/02/2023   12:31 AM 06/01/2023    1:07 AM  CBC  WBC 4.0 - 10.5 K/uL   12.5   Hemoglobin 12.0 - 15.0 g/dL 91.4  78.2  95.6   Hematocrit 36.0 - 46.0 % 31.1  33.1  30.4   Platelets 150 - 400 K/uL   241     Lipid Panel Recent Labs    05/30/23 0134  CHOL 165  TRIG 82  LDLCALC 122*  VLDL 16  HDL 27*  CHOLHDL 6.1  LDLDIRECT 118*   Lipid Panel     Component Value Date/Time   CHOL 165 05/30/2023 0134   TRIG 82 05/30/2023 0134   HDL 27 (L) 05/30/2023 0134   CHOLHDL 6.1 05/30/2023 0134   VLDL 16 05/30/2023 0134   LDLCALC 122 (H) 05/30/2023 0134   LDLDIRECT 118 (H) 05/30/2023 0134     HEMOGLOBIN A1C No results found for: "HGBA1C", "MPG" TSH Recent Labs    05/28/23 1240  TSH 1.427    External labs:  01/25/2022: BNP 1697  Radiology:   CT of the chest, abdomen and pelvis 05/28/2023:  1 . No evidence for aortic dissection or aneurysm.  2. Preferential opacification of the thoracic aorta. No evidence of thoracic aortic aneurysm or dissection. Heart is mildly enlarged. Stable severe atherosclerotic disease of the abdominal aorta resulting in severe stenosis of the proximal abdominal aorta at the level of the celiac axis, superior mesenteric artery and renal arteries. 3. Moderate severe focal stenosis of the origins of the left subclavian and left common carotid arteries. 4. Moderate severe focal stenosis of the origin of the celiac artery. 5. Moderate severe focal stenosis of the origin of the right renal artery. 6. Small bilateral pleural effusions with compressive atelectasis. 7. Mediastinal lymphadenopathy of uncertain etiology. 8. Trace free fluid in the pelvis. 9. Nonobstructing bilateral renal calculi.  Cardiac Studies:   Abdominal arteriogram and selective  left renal arteriogram and lithotripsy balloon angioplasty of the suprarenal abdominal aorta using shockwave M5 catheter 01/24/2022: Abdominal aorta: 2 renal arteries 1 on either sides, mild atherosclerotic changes are present.  Abdominal aorta is diffusely diseased and calcific.  There is a high-grade 90% to 99% stenosis of the suprarenal abdominal aorta with heavy calcification. Intervention data: Successful shockwave lithotripsy using a 8.0 x 60 mm shockwave M5 balloon, gradual progression of balloon inflation pressure from 4 atmospheric pressure already up to 10 atm pressure and using a 8.43 mm lumen, followed by balloon angioplasty with a 4.0 x 60 mm Mustang balloon at 4 atmospheric pressure. Stenosis reduced from 95% to probably insignificant stenosis (<50%).  Angiogram not performed to conserve contrast and would have been poorly visualized with CO2.   Pressure gradient pre and post:  Opening right common femoral artery pressure 52/44 mmHg.  Suprarenal thoracic aortic pressure 180/58 mmHg with a mean of 103 mmHg.  Pressure gradient of approximately 130 mmHg.  Closing right femoral artery pressure 121/62 mmHg.  Pressure gradient approximately 30 mmHg post angioplasty.  5-10 mL contrast utilized 20 to visualize abdominal aorta.  Right femoral arterial access closed with Perclose.  Recommendation: Start the patient on Plavix, I expect improvement in renal function, do not treat hypertension unless blood pressure >160 mmHg.  We will change all the antihypertensive medications. ABI 02/07/2022:   PCV MYOCARDIAL PERFUSION WITH LEXISCAN 03/29/2022  Narrative Exercise nuclear stress test 03/28/2022: Normal ECG stress. The patient exercised for 2 minutes and 59 seconds of a Bruce protocol, achieving approximately 4.64 METs and  achieved 88% MPHR. Stress terminated due to fatigue. The heart rate response was accelerated. Exercise capacity was severely reduced. The baseline blood pressure was 200/70  mmHg and increased to 230/76 mmHg, which is a normal response to exercise. Myocardial perfusion is normal. The LV is mildly dilated in both rest and stress. Overall LV systolic function is mildly abnormal without regional wall motion abnormalities. Stress LV EF: 43%. No previous exam available for comparison. Intermediate risk. Findings may suggest non ischemic cardiomyopathy.   Carotid artery duplex 05/29/2023:  Right Carotid: Velocities in the right ICA are consistent with a 1-39%  stenosis.  Left Carotid: Velocities in the left ICA are consistent with a 1-39% stenosis.   Vertebrals: Bilateral vertebral arteries demonstrate antegrade flow.  Subclavians: Right subclavian artery flow was disturbed. Normal flow hemodynamics were seen in the left subclavian artery.   Echocardiogram 05/29/2023:   1. Left ventricular ejection fraction, by estimation, is 50 to 55%. The left ventricle has low normal function. The left ventricle has no regional wall motion abnormalities. There is moderate left ventricular hypertrophy. Left ventricular diastolic  parameters are consistent with Grade II diastolic dysfunction (pseudonormalization). Elevated left atrial pressure.  2. Right ventricular systolic function is normal. The right ventricular size is normal. Tricuspid regurgitation signal is inadequate for assessing PA pressure.  3. Left atrial size was severely dilated.  4. The mitral valve is normal in structure. No evidence of mitral valve regurgitation.  5. The aortic valve was not well visualized. Aortic valve regurgitation is mild. Moderate aortic valve stenosis. Vmax 3.0 m/s, MG , AVA 1.2 cm^2, DI 0.43  6. The inferior vena cava is dilated in size with <50% respiratory variability, suggesting right atrial pressure of 15 mmHg.  EKG:   EKG 06/22/2023: Normal sinus rhythm at rate of 69 bpm, left atrial enlargement, left axis deviation, left anterior fascicular block.  Poor R wave progression, cannot  exclude anteroseptal infarct old.  LVH.  Nonspecific T abnormality. Compared to 01/31/2022, sinus tachycardia at the rate of 108 bpm.  Otherwise no significant change.Compared to 01/31/2022, sinus tachycardia at the rate of 108 bpm.  Otherwise no significant change.   Medications and allergies   Allergies  Allergen Reactions   Doxycycline Anaphylaxis and Rash   Azilsartan Cough   Biaxin [Clarithromycin] Diarrhea and Other (See Comments)    Vertigo   Floxacillin [Floxacillin (Flucloxacillin)] Other (See Comments)    Vertigo   Morphine And Codeine Other (See Comments)    Headache    Current Outpatient Medications:    acetaminophen (TYLENOL) 500 MG tablet, Take 500 mg by mouth 2 (two) times daily as needed for headache or moderate pain., Disp: , Rfl:    ALPRAZolam (XANAX) 0.5 MG tablet, Take 1 tablet (0.5 mg total) by mouth 3 (three) times daily as needed for anxiety., Disp: 10 tablet, Rfl: 0   amLODipine (NORVASC) 5 MG tablet, Take 2 tablets (10 mg total) by mouth daily. (Patient taking differently: Take 10 mg by mouth 2 (two) times daily.), Disp: 30 tablet, Rfl: 1   Budeson-Glycopyrrol-Formoterol (BREZTRI AEROSPHERE) 160-9-4.8 MCG/ACT AERO, Inhale 2 puffs into the lungs daily as needed (SOB)., Disp: , Rfl:    cloNIDine (CATAPRES) 0.2 MG tablet, Take 1 tablet (0.2 mg total) by mouth 2 (two) times daily. (Patient taking differently: Take 0.2 mg by mouth at bedtime.), Disp: 60 tablet, Rfl: 0   cyanocobalamin (VITAMIN B12) 1000 MCG tablet, Take 1 tablet (1,000 mcg total) by mouth daily., Disp: 30 tablet, Rfl: 1  dapagliflozin propanediol (FARXIGA) 10 MG TABS tablet, Take 1 tablet (10 mg total) by mouth daily., Disp: 30 tablet, Rfl: 0   ferrous sulfate 325 (65 FE) MG tablet, Take 1 tablet (325 mg total) by mouth daily before breakfast., Disp: 30 tablet, Rfl: 3   furosemide (LASIX) 40 MG tablet, Take 1 tablet (40 mg total) by mouth daily., Disp: 30 tablet, Rfl: 0   hydrALAZINE (APRESOLINE) 100 MG  tablet, Take 1 tablet (100 mg total) by mouth 3 (three) times daily., Disp: 90 tablet, Rfl: 0   HYDROcodone-acetaminophen (NORCO/VICODIN) 5-325 MG tablet, Take 0.5 tablets by mouth 2 (two) times daily as needed for moderate pain., Disp: , Rfl:    isosorbide dinitrate (ISORDIL) 30 MG tablet, Take 1 tablet (30 mg total) by mouth 3 (three) times daily. Hold if BP <140/80, Disp: 270 tablet, Rfl: 2   levothyroxine (SYNTHROID) 100 MCG tablet, Take 100 mcg by mouth daily before breakfast., Disp: , Rfl:    pantoprazole (PROTONIX) 40 MG tablet, Take 1 tablet by mouth 2 times daily before a meal for 30 days, THEN take1 tablet daily. (Patient taking differently: Take 1 tablet by mouth daily.), Disp: 90 tablet, Rfl: 0   rosuvastatin (CRESTOR) 40 MG tablet, Take 40 mg by mouth daily., Disp: , Rfl:    sodium chloride (AYR) 0.65 % nasal spray, Place 1 spray into the nose daily as needed for congestion., Disp: , Rfl:    ezetimibe (ZETIA) 10 MG tablet, Take 1 tablet (10 mg total) by mouth daily., Disp: 90 tablet, Rfl: 3   labetalol (NORMODYNE) 300 MG tablet, Take 1 tablet (300 mg total) by mouth 3 (three) times daily., Disp: 270 tablet, Rfl: 1   spironolactone (ALDACTONE) 25 MG tablet, Take 1 tablet (25 mg total) by mouth daily., Disp: 90 tablet, Rfl: 1    Assessment     ICD-10-CM   1. Resistant hypertension  I1A.0 EKG 12-Lead    labetalol (NORMODYNE) 300 MG tablet    spironolactone (ALDACTONE) 25 MG tablet    isosorbide dinitrate (ISORDIL) 30 MG tablet    2. Claudication in peripheral vascular disease (HCC)  I73.9     3. Abdominal aortic stenosis  Q25.1     4. Pure hypercholesterolemia  E78.00 ezetimibe (ZETIA) 10 MG tablet       Medications Discontinued During This Encounter  Medication Reason   isosorbide mononitrate (IMDUR) 120 MG 24 hr tablet Change in therapy   labetalol (NORMODYNE) 300 MG tablet Reorder   spironolactone (ALDACTONE) 25 MG tablet Reorder   ezetimibe (ZETIA) 10 MG tablet Reorder     Meds ordered this encounter  Medications   ezetimibe (ZETIA) 10 MG tablet    Sig: Take 1 tablet (10 mg total) by mouth daily.    Dispense:  90 tablet    Refill:  3   labetalol (NORMODYNE) 300 MG tablet    Sig: Take 1 tablet (300 mg total) by mouth 3 (three) times daily.    Dispense:  270 tablet    Refill:  1   spironolactone (ALDACTONE) 25 MG tablet    Sig: Take 1 tablet (25 mg total) by mouth daily.    Dispense:  90 tablet    Refill:  1   isosorbide dinitrate (ISORDIL) 30 MG tablet    Sig: Take 1 tablet (30 mg total) by mouth 3 (three) times daily. Hold if BP <140/80    Dispense:  270 tablet    Refill:  2    Orders Placed This  Encounter  Procedures   EKG 12-Lead   Recommendations:   MESSINA MARREN is a 67 y.o. Caucasian female patient with longstanding history of hypertension, hyperlipidemia, tobacco use disorder, COPD, staghorn renal calculus, severe abdominal aortic stenosis S/P intravascular lithotripsy on 3/20/2023and renal artery stenosis along with mild colitis.  Admitted to [facility] on 05/28/2023 with hypertensive urgency, lower GI bleed, COPD exacerbation and discharged home now presents for follow-up office visit.  1. Resistant hypertension Patient has resting hypertension and renovascular hypertension.  In spite of being on multiple antihypertensive medication, blood pressure remains high and this is to be expected in view of severe recurrence of abdominal aortic stenosis.  She needs repeat IVL with a larger balloon this will be set up once she is more stable medically, patient recently had GI bleed during hospitalization and also received iron infusion.  Presently not on any antiplatelet agents.  - EKG 12-Lead - labetalol (NORMODYNE) 300 MG tablet; Take 1 tablet (300 mg total) by mouth 3 (three) times daily.  Dispense: 270 tablet; Refill: 1 - spironolactone (ALDACTONE) 25 MG tablet; Take 1 tablet (25 mg total) by mouth daily.  Dispense: 90 tablet;  Refill: 1 - isosorbide dinitrate (ISORDIL) 30 MG tablet; Take 1 tablet (30 mg total) by mouth 3 (three) times daily. Hold if BP <140/80  Dispense: 270 tablet; Refill: 2  2. Claudication in peripheral vascular disease (HCC) Patient related to abdominal aortic stenosis with Leriche syndrome sometimes are stable, bilateral legs are warm and no critical limb ischemia.  3. Abdominal aortic stenosis As dictated above.  4. Pure hypercholesterolemia Patient is presently on high intensity statins and Zetia was added before the prescription. - ezetimibe (ZETIA) 10 MG tablet; Take 1 tablet (10 mg total) by mouth daily.  Dispense: 90 tablet; Refill: 3  Patient has quit smoking since hospitalization and strictly warned her against smoking again.  I would like to see her back in 4 to 6 weeks for follow-up.   Yates Decamp, PA-C 06/21/2023, 11:17 AM Office: [phone]

## 2023-07-10 ENCOUNTER — Inpatient Hospital Stay (HOSPITAL_COMMUNITY)
Admission: EM | Admit: 2023-07-10 | Discharge: 2023-08-08 | DRG: 393 | Disposition: E | Payer: Medicare Other | Attending: Pulmonary Disease | Admitting: Pulmonary Disease

## 2023-07-10 ENCOUNTER — Emergency Department (HOSPITAL_COMMUNITY): Payer: Medicare Other

## 2023-07-10 ENCOUNTER — Observation Stay (HOSPITAL_COMMUNITY): Payer: Medicare Other

## 2023-07-10 ENCOUNTER — Other Ambulatory Visit: Payer: Self-pay

## 2023-07-10 ENCOUNTER — Encounter (HOSPITAL_COMMUNITY): Payer: Self-pay | Admitting: Internal Medicine

## 2023-07-10 DIAGNOSIS — J4489 Other specified chronic obstructive pulmonary disease: Secondary | ICD-10-CM | POA: Diagnosis present

## 2023-07-10 DIAGNOSIS — R531 Weakness: Secondary | ICD-10-CM

## 2023-07-10 DIAGNOSIS — N170 Acute kidney failure with tubular necrosis: Secondary | ICD-10-CM | POA: Diagnosis present

## 2023-07-10 DIAGNOSIS — Z515 Encounter for palliative care: Secondary | ICD-10-CM

## 2023-07-10 DIAGNOSIS — Z888 Allergy status to other drugs, medicaments and biological substances status: Secondary | ICD-10-CM

## 2023-07-10 DIAGNOSIS — D649 Anemia, unspecified: Secondary | ICD-10-CM | POA: Diagnosis present

## 2023-07-10 DIAGNOSIS — J441 Chronic obstructive pulmonary disease with (acute) exacerbation: Secondary | ICD-10-CM | POA: Diagnosis not present

## 2023-07-10 DIAGNOSIS — K7200 Acute and subacute hepatic failure without coma: Secondary | ICD-10-CM | POA: Diagnosis present

## 2023-07-10 DIAGNOSIS — E162 Hypoglycemia, unspecified: Secondary | ICD-10-CM | POA: Diagnosis present

## 2023-07-10 DIAGNOSIS — Z881 Allergy status to other antibiotic agents status: Secondary | ICD-10-CM

## 2023-07-10 DIAGNOSIS — I130 Hypertensive heart and chronic kidney disease with heart failure and stage 1 through stage 4 chronic kidney disease, or unspecified chronic kidney disease: Secondary | ICD-10-CM | POA: Diagnosis present

## 2023-07-10 DIAGNOSIS — Z87442 Personal history of urinary calculi: Secondary | ICD-10-CM

## 2023-07-10 DIAGNOSIS — Z803 Family history of malignant neoplasm of breast: Secondary | ICD-10-CM

## 2023-07-10 DIAGNOSIS — J80 Acute respiratory distress syndrome: Secondary | ICD-10-CM | POA: Diagnosis present

## 2023-07-10 DIAGNOSIS — Z9049 Acquired absence of other specified parts of digestive tract: Secondary | ICD-10-CM

## 2023-07-10 DIAGNOSIS — I739 Peripheral vascular disease, unspecified: Secondary | ICD-10-CM | POA: Diagnosis present

## 2023-07-10 DIAGNOSIS — E872 Acidosis, unspecified: Secondary | ICD-10-CM | POA: Diagnosis present

## 2023-07-10 DIAGNOSIS — Z7984 Long term (current) use of oral hypoglycemic drugs: Secondary | ICD-10-CM

## 2023-07-10 DIAGNOSIS — F419 Anxiety disorder, unspecified: Secondary | ICD-10-CM | POA: Diagnosis present

## 2023-07-10 DIAGNOSIS — I447 Left bundle-branch block, unspecified: Secondary | ICD-10-CM | POA: Diagnosis present

## 2023-07-10 DIAGNOSIS — K219 Gastro-esophageal reflux disease without esophagitis: Secondary | ICD-10-CM | POA: Insufficient documentation

## 2023-07-10 DIAGNOSIS — D62 Acute posthemorrhagic anemia: Secondary | ICD-10-CM | POA: Diagnosis present

## 2023-07-10 DIAGNOSIS — Y848 Other medical procedures as the cause of abnormal reaction of the patient, or of later complication, without mention of misadventure at the time of the procedure: Secondary | ICD-10-CM | POA: Diagnosis present

## 2023-07-10 DIAGNOSIS — I161 Hypertensive emergency: Secondary | ICD-10-CM | POA: Diagnosis present

## 2023-07-10 DIAGNOSIS — F17210 Nicotine dependence, cigarettes, uncomplicated: Secondary | ICD-10-CM | POA: Diagnosis present

## 2023-07-10 DIAGNOSIS — Z885 Allergy status to narcotic agent status: Secondary | ICD-10-CM

## 2023-07-10 DIAGNOSIS — E8771 Transfusion associated circulatory overload: Secondary | ICD-10-CM | POA: Diagnosis present

## 2023-07-10 DIAGNOSIS — K5900 Constipation, unspecified: Secondary | ICD-10-CM | POA: Diagnosis present

## 2023-07-10 DIAGNOSIS — K55049 Acute infarction of large intestine, extent unspecified: Principal | ICD-10-CM | POA: Diagnosis present

## 2023-07-10 DIAGNOSIS — I5020 Unspecified systolic (congestive) heart failure: Secondary | ICD-10-CM | POA: Diagnosis present

## 2023-07-10 DIAGNOSIS — N184 Chronic kidney disease, stage 4 (severe): Secondary | ICD-10-CM | POA: Diagnosis present

## 2023-07-10 DIAGNOSIS — R195 Other fecal abnormalities: Secondary | ICD-10-CM

## 2023-07-10 DIAGNOSIS — Z66 Do not resuscitate: Secondary | ICD-10-CM | POA: Diagnosis not present

## 2023-07-10 DIAGNOSIS — I060 Rheumatic aortic stenosis: Secondary | ICD-10-CM | POA: Diagnosis present

## 2023-07-10 DIAGNOSIS — I1A0 Resistant hypertension: Secondary | ICD-10-CM | POA: Diagnosis present

## 2023-07-10 DIAGNOSIS — Z90710 Acquired absence of both cervix and uterus: Secondary | ICD-10-CM

## 2023-07-10 DIAGNOSIS — I350 Nonrheumatic aortic (valve) stenosis: Secondary | ICD-10-CM

## 2023-07-10 DIAGNOSIS — R110 Nausea: Secondary | ICD-10-CM

## 2023-07-10 DIAGNOSIS — Z833 Family history of diabetes mellitus: Secondary | ICD-10-CM

## 2023-07-10 DIAGNOSIS — Z79899 Other long term (current) drug therapy: Secondary | ICD-10-CM

## 2023-07-10 DIAGNOSIS — E7800 Pure hypercholesterolemia, unspecified: Secondary | ICD-10-CM

## 2023-07-10 DIAGNOSIS — E875 Hyperkalemia: Secondary | ICD-10-CM | POA: Diagnosis not present

## 2023-07-10 DIAGNOSIS — K922 Gastrointestinal hemorrhage, unspecified: Secondary | ICD-10-CM | POA: Diagnosis present

## 2023-07-10 DIAGNOSIS — Z800 Family history of malignant neoplasm of digestive organs: Secondary | ICD-10-CM

## 2023-07-10 DIAGNOSIS — Z8249 Family history of ischemic heart disease and other diseases of the circulatory system: Secondary | ICD-10-CM

## 2023-07-10 DIAGNOSIS — R739 Hyperglycemia, unspecified: Secondary | ICD-10-CM | POA: Diagnosis present

## 2023-07-10 DIAGNOSIS — E785 Hyperlipidemia, unspecified: Secondary | ICD-10-CM | POA: Diagnosis present

## 2023-07-10 DIAGNOSIS — Z7951 Long term (current) use of inhaled steroids: Secondary | ICD-10-CM

## 2023-07-10 DIAGNOSIS — I5032 Chronic diastolic (congestive) heart failure: Secondary | ICD-10-CM | POA: Diagnosis present

## 2023-07-10 DIAGNOSIS — E039 Hypothyroidism, unspecified: Secondary | ICD-10-CM | POA: Diagnosis present

## 2023-07-10 DIAGNOSIS — E876 Hypokalemia: Secondary | ICD-10-CM | POA: Diagnosis present

## 2023-07-10 DIAGNOSIS — Z79890 Hormone replacement therapy (postmenopausal): Secondary | ICD-10-CM

## 2023-07-10 DIAGNOSIS — D509 Iron deficiency anemia, unspecified: Secondary | ICD-10-CM

## 2023-07-10 DIAGNOSIS — K55029 Acute infarction of small intestine, extent unspecified: Secondary | ICD-10-CM | POA: Diagnosis present

## 2023-07-10 DIAGNOSIS — J449 Chronic obstructive pulmonary disease, unspecified: Secondary | ICD-10-CM | POA: Diagnosis present

## 2023-07-10 DIAGNOSIS — I10 Essential (primary) hypertension: Secondary | ICD-10-CM | POA: Diagnosis present

## 2023-07-10 DIAGNOSIS — I701 Atherosclerosis of renal artery: Secondary | ICD-10-CM | POA: Diagnosis present

## 2023-07-10 LAB — BASIC METABOLIC PANEL
Anion gap: 13 (ref 5–15)
BUN: 23 mg/dL (ref 8–23)
CO2: 17 mmol/L — ABNORMAL LOW (ref 22–32)
Calcium: 8.4 mg/dL — ABNORMAL LOW (ref 8.9–10.3)
Chloride: 97 mmol/L — ABNORMAL LOW (ref 98–111)
Creatinine, Ser: 1.49 mg/dL — ABNORMAL HIGH (ref 0.44–1.00)
GFR, Estimated: 38 mL/min — ABNORMAL LOW (ref >60)
Glucose, Bld: 284 mg/dL — ABNORMAL HIGH (ref 70–99)
Potassium: 5.2 mmol/L — ABNORMAL HIGH (ref 3.5–5.1)
Sodium: 127 mmol/L — ABNORMAL LOW (ref 135–145)

## 2023-07-10 LAB — CBC
HCT: 18.9 % — ABNORMAL LOW (ref 36.0–46.0)
Hemoglobin: 5.8 g/dL — CL (ref 12.0–15.0)
MCH: 27.2 pg (ref 26.0–34.0)
MCHC: 30.7 g/dL (ref 30.0–36.0)
MCV: 88.7 fL (ref 80.0–100.0)
Platelets: 246 10*3/uL (ref 150–400)
RBC: 2.13 MIL/uL — ABNORMAL LOW (ref 3.87–5.11)
RDW: 14.7 % (ref 11.5–15.5)
WBC: 10.5 10*3/uL (ref 4.0–10.5)
nRBC: 0 % (ref 0.0–0.2)

## 2023-07-10 LAB — GLUCOSE, CAPILLARY
Glucose-Capillary: 140 mg/dL — ABNORMAL HIGH (ref 70–99)
Glucose-Capillary: 225 mg/dL — ABNORMAL HIGH (ref 70–99)

## 2023-07-10 LAB — COMPREHENSIVE METABOLIC PANEL
ALT: 21 U/L (ref 0–44)
AST: 24 U/L (ref 15–41)
Albumin: 3.9 g/dL (ref 3.5–5.0)
Alkaline Phosphatase: 40 U/L (ref 38–126)
Anion gap: 13 (ref 5–15)
BUN: 17 mg/dL (ref 8–23)
CO2: 23 mmol/L (ref 22–32)
Calcium: 8.7 mg/dL — ABNORMAL LOW (ref 8.9–10.3)
Chloride: 95 mmol/L — ABNORMAL LOW (ref 98–111)
Creatinine, Ser: 0.97 mg/dL (ref 0.44–1.00)
GFR, Estimated: >60 mL/min (ref >60)
Glucose, Bld: 118 mg/dL — ABNORMAL HIGH (ref 70–99)
Potassium: 3.2 mmol/L — ABNORMAL LOW (ref 3.5–5.1)
Sodium: 131 mmol/L — ABNORMAL LOW (ref 135–145)
Total Bilirubin: 0.8 mg/dL (ref 0.3–1.2)
Total Protein: 6.1 g/dL — ABNORMAL LOW (ref 6.5–8.1)

## 2023-07-10 LAB — PREPARE RBC (CROSSMATCH)

## 2023-07-10 LAB — I-STAT CG4 LACTIC ACID, ED: Lactic Acid, Venous: 1 mmol/L (ref 0.5–1.9)

## 2023-07-10 LAB — MRSA NEXT GEN BY PCR, NASAL: MRSA by PCR Next Gen: NOT DETECTED

## 2023-07-10 LAB — BRAIN NATRIURETIC PEPTIDE: B Natriuretic Peptide: 1813.7 pg/mL — ABNORMAL HIGH (ref 0.0–100.0)

## 2023-07-10 LAB — TROPONIN I (HIGH SENSITIVITY)
Troponin I (High Sensitivity): 95 ng/L — ABNORMAL HIGH (ref <18)
Troponin I (High Sensitivity): 101 ng/L (ref <18)
Troponin I (High Sensitivity): 214 ng/L (ref <18)

## 2023-07-10 LAB — LIPASE, BLOOD: Lipase: 36 U/L (ref 11–51)

## 2023-07-10 LAB — MAGNESIUM: Magnesium: 1.8 mg/dL (ref 1.7–2.4)

## 2023-07-10 LAB — POC OCCULT BLOOD, ED: Fecal Occult Bld: POSITIVE — AB

## 2023-07-10 MED ORDER — PANTOPRAZOLE INFUSION (NEW) - SIMPLE MED
8.0000 mg/h | INTRAVENOUS | Status: DC
  Administered 2023-07-10 – 2023-07-11 (×3): 8 mg/h via INTRAVENOUS
  Filled 2023-07-10 (×3): qty 100

## 2023-07-10 MED ORDER — ORAL CARE MOUTH RINSE: 15.0000 mL | OROMUCOSAL | Status: DC | PRN

## 2023-07-10 MED ORDER — SODIUM CHLORIDE 0.9% IV SOLUTION: Freq: Once | INTRAVENOUS | Status: AC

## 2023-07-10 MED ORDER — SODIUM CHLORIDE 0.9% FLUSH
3.0000 mL | Freq: Two times a day (BID) | INTRAVENOUS | Status: DC
  Administered 2023-07-10 – 2023-07-11 (×3): 3 mL via INTRAVENOUS

## 2023-07-10 MED ORDER — CLONIDINE HCL 0.2 MG PO TABS: 0.2000 mg | ORAL_TABLET | Freq: Every day | ORAL | Status: DC

## 2023-07-10 MED ORDER — FUROSEMIDE 40 MG PO TABS: 40.0000 mg | ORAL_TABLET | Freq: Every day | ORAL | Status: DC

## 2023-07-10 MED ORDER — FUROSEMIDE 20 MG PO TABS: 40.0000 mg | ORAL_TABLET | Freq: Every day | ORAL | Status: DC

## 2023-07-10 MED ORDER — HYDRALAZINE HCL 20 MG/ML IJ SOLN: 20.0000 mg | INTRAMUSCULAR | Status: AC

## 2023-07-10 MED ORDER — DIPHENHYDRAMINE HCL 50 MG/ML IJ SOLN
25.0000 mg | Freq: Once | INTRAMUSCULAR | Status: AC
  Administered 2023-07-10: 25 mg via INTRAVENOUS
  Filled 2023-07-10: qty 1

## 2023-07-10 MED ORDER — LORAZEPAM 2 MG/ML IJ SOLN
1.0000 mg | Freq: Four times a day (QID) | INTRAMUSCULAR | Status: DC | PRN
  Administered 2023-07-10 – 2023-07-11 (×2): 1 mg via INTRAVENOUS
  Filled 2023-07-10 (×2): qty 1

## 2023-07-10 MED ORDER — CLEVIDIPINE BUTYRATE 0.5 MG/ML IV EMUL
0.0000 mg/h | INTRAVENOUS | Status: DC
  Administered 2023-07-10 (×2): 2 mg/h via INTRAVENOUS
  Administered 2023-07-11: 10 mg/h via INTRAVENOUS
  Filled 2023-07-10: qty 100
  Filled 2023-07-10: qty 50

## 2023-07-10 MED ORDER — HYDRALAZINE HCL 20 MG/ML IJ SOLN
10.0000 mg | Freq: Once | INTRAMUSCULAR | Status: AC
  Administered 2023-07-10: 10 mg via INTRAVENOUS

## 2023-07-10 MED ORDER — NICARDIPINE HCL IN NACL 20-0.86 MG/200ML-% IV SOLN
3.0000 mg/h | INTRAVENOUS | Status: DC
  Filled 2023-07-10: qty 200

## 2023-07-10 MED ORDER — ROSUVASTATIN CALCIUM 20 MG PO TABS: 40.0000 mg | ORAL_TABLET | Freq: Every day | ORAL | Status: DC

## 2023-07-10 MED ORDER — SPIRONOLACTONE 25 MG PO TABS: 25.0000 mg | ORAL_TABLET | Freq: Every day | ORAL | Status: DC

## 2023-07-10 MED ORDER — FUROSEMIDE 10 MG/ML IJ SOLN
40.0000 mg | Freq: Once | INTRAMUSCULAR | Status: AC
  Administered 2023-07-10: 40 mg via INTRAVENOUS
  Filled 2023-07-10: qty 4

## 2023-07-10 MED ORDER — PANTOPRAZOLE 80MG IVPB - SIMPLE MED
80.0000 mg | Freq: Once | INTRAVENOUS | Status: AC
  Administered 2023-07-10: 80 mg via INTRAVENOUS
  Filled 2023-07-10: qty 100

## 2023-07-10 MED ORDER — HYDRALAZINE HCL 20 MG/ML IJ SOLN
10.0000 mg | INTRAMUSCULAR | Status: AC
  Administered 2023-07-10: 10 mg via INTRAVENOUS

## 2023-07-10 MED ORDER — LEVOTHYROXINE SODIUM 100 MCG PO TABS: 100.0000 ug | ORAL_TABLET | Freq: Every day | ORAL | Status: DC

## 2023-07-10 MED ORDER — POTASSIUM CHLORIDE CRYS ER 20 MEQ PO TBCR
40.0000 meq | EXTENDED_RELEASE_TABLET | Freq: Once | ORAL | Status: DC
  Filled 2023-07-10: qty 2

## 2023-07-10 MED ORDER — ONDANSETRON HCL 4 MG/2ML IJ SOLN: 4.0000 mg | Freq: Four times a day (QID) | INTRAMUSCULAR | Status: DC | PRN

## 2023-07-10 MED ORDER — BUDESON-GLYCOPYRROL-FORMOTEROL 160-9-4.8 MCG/ACT IN AERO: 2.0000 | INHALATION_SPRAY | Freq: Every day | RESPIRATORY_TRACT | Status: DC | PRN

## 2023-07-10 MED ORDER — IPRATROPIUM-ALBUTEROL 0.5-2.5 (3) MG/3ML IN SOLN
3.0000 mL | Freq: Four times a day (QID) | RESPIRATORY_TRACT | Status: DC
  Administered 2023-07-10 – 2023-07-11 (×3): 3 mL via RESPIRATORY_TRACT
  Filled 2023-07-10 (×3): qty 3

## 2023-07-10 MED ORDER — HYDRALAZINE HCL 20 MG/ML IJ SOLN
INTRAMUSCULAR | Status: AC
  Administered 2023-07-10: 20 mg via INTRAVENOUS
  Filled 2023-07-10: qty 1

## 2023-07-10 MED ORDER — POTASSIUM CHLORIDE 10 MEQ/100ML IV SOLN
10.0000 meq | INTRAVENOUS | Status: AC
  Administered 2023-07-10 (×4): 10 meq via INTRAVENOUS
  Filled 2023-07-10 (×4): qty 100

## 2023-07-10 MED ORDER — HYDRALAZINE HCL 20 MG/ML IJ SOLN
INTRAMUSCULAR | Status: AC
  Filled 2023-07-10: qty 1

## 2023-07-10 MED ORDER — ACETAMINOPHEN 650 MG RE SUPP: 650.0000 mg | Freq: Four times a day (QID) | RECTAL | Status: DC | PRN

## 2023-07-10 MED ORDER — LABETALOL HCL 5 MG/ML IV SOLN
5.0000 mg | INTRAVENOUS | Status: DC | PRN
  Administered 2023-07-10: 5 mg via INTRAVENOUS
  Filled 2023-07-10: qty 4

## 2023-07-10 MED ORDER — HYDRALAZINE HCL 20 MG/ML IJ SOLN
20.0000 mg | Freq: Once | INTRAMUSCULAR | Status: DC
  Filled 2023-07-10: qty 1

## 2023-07-10 MED ORDER — PANTOPRAZOLE SODIUM 40 MG IV SOLR: 40.0000 mg | Freq: Two times a day (BID) | INTRAVENOUS | Status: DC

## 2023-07-10 MED ORDER — PROCHLORPERAZINE EDISYLATE 10 MG/2ML IJ SOLN
10.0000 mg | Freq: Once | INTRAMUSCULAR | Status: AC
  Administered 2023-07-10: 10 mg via INTRAVENOUS
  Filled 2023-07-10: qty 2

## 2023-07-10 MED ORDER — ACETAMINOPHEN 325 MG PO TABS: 650.0000 mg | ORAL_TABLET | Freq: Four times a day (QID) | ORAL | Status: DC | PRN

## 2023-07-10 MED ORDER — CHLORHEXIDINE GLUCONATE CLOTH 2 % EX PADS
6.0000 | MEDICATED_PAD | Freq: Every day | CUTANEOUS | Status: DC
  Administered 2023-07-11: 6 via TOPICAL

## 2023-07-10 MED ORDER — LABETALOL HCL 5 MG/ML IV SOLN: 5.0000 mg | Freq: Once | INTRAVENOUS | Status: DC

## 2023-07-10 MED ORDER — LABETALOL HCL 5 MG/ML IV SOLN: 10.0000 mg | INTRAVENOUS | Status: DC | PRN

## 2023-07-10 MED ORDER — EZETIMIBE 10 MG PO TABS: 10.0000 mg | ORAL_TABLET | Freq: Every day | ORAL | Status: DC

## 2023-07-10 MED ORDER — LABETALOL HCL 5 MG/ML IV SOLN
10.0000 mg | Freq: Once | INTRAVENOUS | Status: AC
  Administered 2023-07-10: 10 mg via INTRAVENOUS
  Filled 2023-07-10: qty 4

## 2023-07-10 NOTE — Progress Notes (Signed)
Pt belongings: Red iphone with case, tablet with case, purse, shirt, socks, sandals

## 2023-07-10 NOTE — ED Triage Notes (Signed)
PT BIB EMS from home for bloating, anorexia and nausea. Pt reports last bowel movement was Friday, and I taking iron for recent low hemoglobin.   EMS 220/80 100 HR 97% RA 146 BG

## 2023-07-10 NOTE — ED Notes (Signed)
PT had episode of desaturation to 90% then was placed on 5 lpm [place] and recovered to 95% within 5 mins.

## 2023-07-10 NOTE — ED Notes (Signed)
Pt well appearing and denies any transfusion reactions.

## 2023-07-10 NOTE — Significant Event (Signed)
Rapid Response Event Note   Reason for Call :  SOB, increased oxygen demant  Initial Focused Assessment:  Pt sitting up in bed in tripod position. Her breathing is labored and tachypneic. She is alert and oriented, denies CP/dizziness but does c/o SOB. Lungs with crackles t/o. Skin warm to touch.   T-97.6, HR-109, BP-241/78, RR-30, SpO2-88% on 6L [place].  Interventions:  40mg  lasix given, 2nd units PRBCs stopped  PTA RRT PCXR-Increased bilateral interstitial and airspace opacities may reflect pulmonary edema. Unchanged small left pleural effusion with associated atelectasis/airspace disease. Trop, BMP Bipap 5mg  labetolol  10mg  hydralazine x 2 PCCM consulted: 40mg  lasix 20mg  hydralazine  Duoneb Tx to ICU for cleviprex gtt Plan of Care:  Tx to 3M09  Event Summary:   MD Notified: Dr. Julian Reil notified and to bedside. PCCM consulted-Dr. Everardo All to bedside. Call Time:1857 Arrival Time:1930 End ZOXW:[id number]  Terrilyn Saver, RN

## 2023-07-10 NOTE — Consult Note (Signed)
NAME:  Gwendolyn [name], MRN:  [id number], DOB:  [birth year], LOS: 0 ADMISSION DATE:  07/09/2023, CONSULTATION DATE:  08/04/2023 REFERRING MD:  Lyda Perone, DO CHIEF COMPLAINT:  Respiratory distress   History of Present Illness:  67 year old female who presented with weakness, SOB and nausea and abdominal discomfort and found with Hg 5.8, previously 10. Reported possible dark stools but also recently started iron supplement. Had been unable to take PO meds recently due to abdominal pain. Of note trop 95, BNP 1800. CXR with small left pleural effusion. TRH admitted for GI bleed. PRBC x 2 ordered however received only 1 unit and during the 2nd unit developed respiratory distress. BiPAP started. Prior to this patient had worsening hypertension with SBP persistent >200s requiring multiple IV antihypertensives including lasix 40 mg, hydralazine 20 mg followed by repeat 20 mg and labetalol 10 mg. PCCM consulted for assistance.  Pertinent  Medical History  hypertension, hyperlipidemia, anemia, hypothyroidism, PAD, COPD, GERD, CHF, aortic valve stenosis, GI bleed   Significant Hospital Events: Including procedures, antibiotic start and stop dates in addition to other pertinent events     Interim History / Subjective:  As above  Objective   Blood pressure (!) 218/68, pulse 87, temperature 97.6 F (36.4 C), temperature source Oral, resp. rate (!) 34, height 5\' 1"  (1.549 m), weight 61.7 kg, SpO2 99%.    FiO2 (%):  [45 %-100 %] 45 % PEEP:  [6 cmH20] 6 cmH20 Pressure Support:  [8 cmH20] 8 cmH20   Intake/Output Summary (Last 24 hours) at 07/20/2023 2116 Last data filed at 07/18/2023 1745 Gross per 24 hour  Intake 771.67 ml  Output --  Net 771.67 ml   Filed Weights   07/18/2023 1248  Weight: 61.7 kg   Physical Exam: General: Chronically and critically ill-appearing, no acute distress HENT: [place], AT, BiPAP in place Eyes: Proptosis, EOMI, no scleral icterus Respiratory: Diminished to auscultation with  bibasilar crackles Cardiovascular:  RRR, -M/R/G, no JVD GI: BS+, soft, nontender Extremities:-Edema,-tenderness Neuro: AAO x3, CNII-XII grossly intact Psych: Normal mood, normal affect  Imaging, labs and test in EMR in the last 24 hours reviewed independently by me. Pertinent findings below: K 3.2 BNP 1813 Hg 5.8  Resolved Hospital Problem list   N/A  Assessment & Plan:   Acute hypoxemic respiratory failure 2/2 flash pulmonary edema in setting of transfusion associated circulatory overload and hypertensive emergency CXR c/w with pulmonary edema, small left pleural effusion -S/p lasix 40 mg. Give additional 40 mg now -S/p hydralazine 20 mg, 20 mg. Give additional hydralazine 20 mg now -S/p labetalol 10 mg  -Start Cleviprex gtt for goal BP <180/<120 in the 1st hour (20% reduction) followed by goal SBP <160/<110 -Resume home PO meds when able: hydralazine 50 mg TID, amlodipine, isordril, labetalol TID, clonidine BID, spironolactone, lasix -Remain on BiPAP tonight. Tolerating with good volumes. Mentation intact. No acute indication for intubation  Acute blood loss anemia  GI bleed - previously 10 on 06/03/23 S/p PRBC x 1. 2nd unit discontinued in setting of respiratory distress - F/u H/H - Transfuse additional units when respiratory status improves - PPI gtt - GI consulted by TRH  Chronic systolic and diastolic heart failure - Continue home diuretics  HLD - Continue home statins when able  Hypothyroidism - Continue home synthroid when able  GERD - PPI  COPD - On home Breztri - Duonebs scheduled for now  Hypokalemia - Recheck now - Replete PRN  AKI  - Monitor UOP/Cr -  Best Practice (right click and "Reselect all SmartList Selections" daily)   Diet/type: NPO DVT prophylaxis: other contraindicated in bleed GI prophylaxis: PPI Lines: N/A Foley:  N/A Code Status:  full code Last date of multidisciplinary goals of care discussion [ ]   Labs   CBC: Recent  Labs  Lab 07/29/2023 1256  WBC 10.5  HGB 5.8*  HCT 18.9*  MCV 88.7  PLT 246    Basic Metabolic Panel: Recent Labs  Lab 08/07/2023 1256  NA 131*  K 3.2*  CL 95*  CO2 23  GLUCOSE 118*  BUN 17  CREATININE 0.97  CALCIUM 8.7*  MG 1.8   GFR: Estimated Creatinine Clearance: 47.4 mL/min (by C-G formula based on SCr of 0.97 mg/dL). Recent Labs  Lab 07/09/2023 1256 07/17/2023 1310  WBC 10.5  --   LATICACIDVEN  --  1.0    Liver Function Tests: Recent Labs  Lab 07/18/2023 1256  AST 24  ALT 21  ALKPHOS 40  BILITOT 0.8  PROT 6.1*  ALBUMIN 3.9   Recent Labs  Lab 07/20/2023 1256  LIPASE 36   No results for input(s): "AMMONIA" in the last 168 hours.  ABG    Component Value Date/Time   HCO3 17.6 (L) 02/02/2022 0435   TCO2 23 02/01/2022 1830   ACIDBASEDEF 6.0 (H) 02/02/2022 0435   O2SAT 98.1 02/02/2022 0435     Coagulation Profile: No results for input(s): "INR", "PROTIME" in the last 168 hours.  Cardiac Enzymes: No results for input(s): "CKTOTAL", "CKMB", "CKMBINDEX", "TROPONINI" in the last 168 hours.  HbA1C: No results found for: "HGBA1C"  CBG: No results for input(s): "GLUCAP" in the last 168 hours.  Review of Systems:   Review of Systems  Constitutional:  Negative for chills, diaphoresis, fever, malaise/fatigue and weight loss.  HENT:  Negative for congestion.   Respiratory:  Positive for shortness of breath. Negative for cough, hemoptysis, sputum production and wheezing.   Cardiovascular:  Negative for chest pain, palpitations and leg swelling.     Past Medical History:  She,  has a past medical history of Acute exacerbation of chronic obstructive pulmonary disease (COPD) (HCC) (05/28/2023), Acute on chronic diastolic CHF (congestive heart failure) (HCC) (10/18/2022), Anemia, Anxiety, Asthma, COPD (chronic obstructive pulmonary disease) (HCC), Depression, GERD (gastroesophageal reflux disease), History of kidney stones, Hypertension, and Hypothyroidism.    Surgical History:   Past Surgical History:  Procedure Laterality Date   ABDOMINAL AORTOGRAM W/LOWER EXTREMITY N/A 02/03/2022   Procedure: ABDOMINAL AORTOGRAM W/LOWER EXTREMITY;  Surgeon: Yates Decamp, MD;  Location: [facility]AS[facility];  Service: Cardiovascular;  Laterality: N/A;   ABDOMINAL HYSTERECTOMY     BIOPSY  10/21/2022   Procedure: BIOPSY;  Surgeon: Jeani Hawking, MD;  Location: [facility] ENDOSCOPY;  Service: Gastroenterology;;   Cesarean Section     (2)   CHOLECYSTECTOMY     COLONOSCOPY  2020   COLONOSCOPY WITH PROPOFOL N/A 10/21/2022   Procedure: COLONOSCOPY WITH PROPOFOL;  Surgeon: Jeani Hawking, MD;  Location: [facility] ENDOSCOPY;  Service: Gastroenterology;  Laterality: N/A;   COLONOSCOPY WITH PROPOFOL N/A 06/02/2023   Procedure: COLONOSCOPY WITH PROPOFOL;  Surgeon: Jeani Hawking, MD;  Location: [facility] ENDOSCOPY;  Service: Gastroenterology;  Laterality: N/A;   ESOPHAGOGASTRODUODENOSCOPY (EGD) WITH PROPOFOL N/A 10/21/2022   Procedure: ESOPHAGOGASTRODUODENOSCOPY (EGD) WITH PROPOFOL;  Surgeon: Jeani Hawking, MD;  Location: [facility] ENDOSCOPY;  Service: Gastroenterology;  Laterality: N/A;   Gastric Ulcer  2020   GIVENS CAPSULE STUDY N/A 05/31/2023   Procedure: GIVENS CAPSULE STUDY;  Surgeon: Jeani Hawking, MD;  Location: [facility] ENDOSCOPY;  Service: Gastroenterology;  Laterality: N/A;   PERIPHERAL VASCULAR BALLOON ANGIOPLASTY  02/03/2022   Procedure: PERIPHERAL VASCULAR BALLOON ANGIOPLASTY;  Surgeon: Yates Decamp, MD;  Location: [facility] CV [facility];  Service: Cardiovascular;;   POLYPECTOMY  10/21/2022   Procedure: POLYPECTOMY;  Surgeon: Jeani Hawking, MD;  Location: [facility] ENDOSCOPY;  Service: Gastroenterology;;   RENAL ANGIOGRAPHY N/A 02/03/2022   Procedure: RENAL ANGIOGRAPHY;  Surgeon: Yates Decamp, MD;  Location: [facility] INVASIVE CV LAB;  Service: Cardiovascular;  Laterality: N/A;   UMBILICAL HERNIA REPAIR N/A 10/29/2020   Procedure: OPEN UMBILICAL HERNIA REPAIR WITH MESH;  Surgeon: Stechschulte, Hyman Hopes, MD;  Location: [facility]  OR;  Service: General;  Laterality: N/A;     Social History:   reports that she has been smoking cigarettes. She has a 40 pack-year smoking history. She has never used smokeless tobacco. She reports that she does not drink alcohol and does not use drugs.   Family History:  Her family history includes Breast cancer (age of onset: 68) in her sister; Colon cancer in her mother; Diabetes in her father and sister; Hypertension in her sister.   Allergies Allergies  Allergen Reactions   Doxycycline Anaphylaxis and Rash   Azilsartan Cough   Biaxin [Clarithromycin] Diarrhea and Other (See Comments)    Vertigo   Floxacillin [Floxacillin (Flucloxacillin)] Other (See Comments)    Vertigo   Labetalol Hcl Other (See Comments)    Cannot handle a 300 mg dose at once- must be a lesser strength    Morphine And Codeine Other (See Comments)    Headache     Home Medications  Prior to Admission medications   Medication Sig Start Date End Date Taking? Authorizing Provider  acetaminophen (TYLENOL) 500 MG tablet Take 500 mg by mouth 2 (two) times daily as needed for mild pain, moderate pain or headache.   Yes [provider]  ALPRAZolam (XANAX) 0.5 MG tablet Take 1 tablet (0.5 mg total) by mouth 3 (three) times daily as needed for anxiety. Patient taking differently: Take 0.25-0.5 mg by mouth 3 (three) times daily as needed for anxiety. 06/04/23  Yes Arrien, York Ram, MD  amLODipine (NORVASC) 5 MG tablet Take 2 tablets (10 mg total) by mouth daily. 10/21/22  Yes Shalhoub, Deno Lunger, MD  cloNIDine (CATAPRES) 0.2 MG tablet Take 1 tablet (0.2 mg total) by mouth 2 (two) times daily. Patient taking differently: Take 0.2 mg by mouth at bedtime. 06/04/23 08/06/2023 Yes Arrien, York Ram, MD  cyanocobalamin (VITAMIN B12) 1000 MCG tablet Take 1 tablet (1,000 mcg total) by mouth daily. Patient taking differently: Take 1,000 mcg by mouth at bedtime. 10/21/22  Yes Shalhoub, Deno Lunger, MD  ezetimibe  (ZETIA) 10 MG tablet Take 1 tablet (10 mg total) by mouth daily. 06/21/23 07/21/23 Yes Yates Decamp, MD  ferrous sulfate 325 (65 FE) MG tablet Take 1 tablet (325 mg total) by mouth daily before breakfast. 10/21/22 10/21/23 Yes Shalhoub, Deno Lunger, MD  furosemide (LASIX) 20 MG tablet Take 20 mg by mouth in the morning.   Yes [provider]  hydrALAZINE (APRESOLINE) 50 MG tablet Take 50 mg by mouth 3 (three) times daily.   Yes [provider]  HYDROcodone-acetaminophen (NORCO/VICODIN) 5-325 MG tablet Take 0.25-1 tablets by mouth 2 (two) times daily as needed for moderate pain.   Yes [provider]  isosorbide dinitrate (ISORDIL) 30 MG tablet Take 1 tablet (30 mg total) by mouth 3 (three) times daily. Hold if BP <140/80 Patient taking differently:  Take 30 mg by mouth daily with lunch. Hold if BP <140/80 06/21/23  Yes Yates Decamp, MD  labetalol (NORMODYNE) 100 MG tablet Take 100 mg by mouth 3 (three) times daily.   Yes [provider]  levothyroxine (SYNTHROID) 100 MCG tablet Take 100 mcg by mouth daily before breakfast.   Yes [provider]  pantoprazole (PROTONIX) 40 MG tablet Take 1 tablet by mouth 2 times daily before a meal for 30 days, THEN take1 tablet daily. Patient taking differently: Take 40 mg by mouth in the morning 30 minutes before breakfast 10/21/22 07/21/2023 Yes Shalhoub, Deno Lunger, MD  rosuvastatin (CRESTOR) 40 MG tablet Take 40 mg by mouth daily. 09/14/22  Yes [provider]  sodium chloride (AYR) 0.65 % nasal spray Place 1 spray into the nose as needed for congestion (EACH NOSTRIL).   Yes [provider]  spironolactone (ALDACTONE) 25 MG tablet Take 1 tablet (25 mg total) by mouth daily. 06/21/23 07/21/23 Yes Yates Decamp, MD  dapagliflozin propanediol (FARXIGA) 10 MG TABS tablet Take 1 tablet (10 mg total) by mouth daily. Patient not taking: Reported on 08/07/2023 06/05/23   Arrien, York Ram, MD  furosemide (LASIX) 40 MG tablet  Take 1 tablet (40 mg total) by mouth daily. Patient not taking: Reported on 08/06/2023 06/05/23   Arrien, York Ram, MD  hydrALAZINE (APRESOLINE) 100 MG tablet Take 1 tablet (100 mg total) by mouth 3 (three) times daily. Patient not taking: Reported on 08/06/2023 06/04/23 08/06/2023  Arrien, York Ram, MD  labetalol (NORMODYNE) 300 MG tablet Take 1 tablet (300 mg total) by mouth 3 (three) times daily. Patient not taking: Reported on 07/18/2023 06/21/23   Yates Decamp, MD     Critical care time: 35 min    The patient is critically ill with multiple organ systems failure and requires high complexity decision making for assessment and support, frequent evaluation and titration of therapies, application of advanced monitoring technologies and extensive interpretation of multiple databases.   Mechele Collin, M.D. [facility] [place] Pulmonary/Critical Care Medicine 08/01/2023 9:17 PM   Please see Amion for pager number to reach on-call Pulmonary and Critical Care Team.

## 2023-07-10 NOTE — Consult Note (Addendum)
Attending physician's note   I have taken a history, reviewed the chart, and examined the patient. I performed a substantive portion of this encounter, including complete performance of at least one of the key components, in conjunction with the APP. I agree with the APP's note, impression, and recommendations with my edits.   67 year old female with medical history as outlined below, to include recent hospitalization in July with symptomatic acute on chronic iron deficiency anemia.  Evaluation during that hospital course included a VCE (active bleeding in the distal ileum, poor prep) and colonoscopy (stool, poor visualization).  Was discharged home with continued iron and had an uptrending Hgb, which she reports was 9.2 a couple weeks ago.  Per chart review, baseline appears to be ~10-11.  She now presents to the ER with abdominal bloating, decreased p.o. intake, nausea, and GI service consulted for evaluation of symptomatic anemia.  ER evaluation notable for the following: - H/H 5.8/18.9 with MCV/RDW 89/14.7 - NA 131, K3.2, BUN/creatinine 17/0.97 - Normal liver enzymes - Troponin 95 - Heme positive stool  BP 216/58.  Patient reports not having taken her home BP meds due to nausea.  1) Symptomatic anemia 2) Acute on chronic anemia Recent hospitalization for acute on chronic IDA with active bleeding in the distal small bowel, but unable to further visualize on colonoscopy due to suboptimal prep.  Bleeding stopped.  Discussed potential DDx for recurrent obscure bleeding, with plan for the following: - RBC transfusion now - Posttransfusion CBC check with serial CBCs and additional blood products as needed per protocol - Based on previous VCE report, will attempt Meckel's scan now - If Meckel's unrevealing, may consider slow 2-day prep with repeat colonoscopy on this admission - May benefit from IV iron on this admission  3) Nausea - Zofran prn - CLD as tolerated  4) Hypertension -  Management per primary Medicine service  5) Constipation 6) Bloating - Starting bowel regimen as outlined below - If planning repeat colonoscopy, plan for extended slow 2-day prep given previous inadequate preps   Dr. Elnoria Howard will assume her ongoing inpatient care starting tomorrow morning  Doristine Locks, DO, [place] [phone] office          Consultation  Referring Provider:  [facility]  Primary Care Physician:  Ralene Ok, MD Primary Gastroenterologist:  Dr. Loreta Ave       Reason for Consultation:     Symptomatic anemia  LOS: 0 days          HPI:   Gwendolyn [name] is a 67 y.o. female with past medical history significant for COPD, HFpEF, presents for evaluation of symptomatic anemia.  Recent hospitalization end of July 2024 for acute on chronic anemia with hemoglobin dropped to 7.1 from 10 at that time without overt bleeding.  Capsule endoscopy showed active bleeding in distal terminal ileum.  Colonoscopy showed poor prep.  Similar admission 10/2022 with unrevealing EGD/colonoscopy.  Patient presents to ED with generalized weakness, shortness of breath, and abdominal discomfort.  Reports recent CBC done at work a few weeks ago was 9.2.  Patient states iron supplement causes constipation. After she had a "blowout" bowel movement on Thursday she had progressive fatigue. Reports nausea that occurred Friday. Has been out of her protonix for 4 days and also reports increased burping. Reports chronically dark stools from iron supplementation.  BP 213/58 since patient reports home blood pressures with systolics in the 200s).  Has not taken blood pressure medication due to not  feeling well. However, reports her HTN has been ongoing for months and she is being followed by cardiology.  Pertinent workup - Hgb 5.8 (10.2 06/03/2023) -MCV 88.7 - BUN 17, creatinine 0.97 - Sodium 131, potassium 3.2 - Lipase 36 - Chest x-ray shows small left pleural effusion (improved from last exam)   PREVIOUS  GI WORKUP   Capsule endoscopy 05/31/2023 showed active bleeding in the distal terminal ileum  Colonoscopy 06/02/2023 after capsule endoscopy showed poor prep, stool in the entire colon  Colonoscopy 10/21/2022 for IDA - Five 4 to 10 mm polyps in transverse colon resected and retrieved (tubular adenomas)  EGD for IDA 10/21/2022 - Normal esophagus - Erosive gastropathy with no stigmata of recent bleeding - Normal duodenum   Past Medical History:  Diagnosis Date   Anemia    Anxiety    Asthma    COPD (chronic obstructive pulmonary disease) (HCC)    Depression    GERD (gastroesophageal reflux disease)    History of kidney stones    Hypertension    Hypothyroidism     Surgical History:  She  has a past surgical history that includes Abdominal hysterectomy; Cholecystectomy; Gastric Ulcer (2020); Colonoscopy (2020); Cesarean Section; Umbilical hernia repair (N/A, 10/29/2020); ABDOMINAL AORTOGRAM W/LOWER EXTREMITY (N/A, 02/03/2022); RENAL ANGIOGRAPHY (N/A, 02/03/2022); PERIPHERAL VASCULAR BALLOON ANGIOPLASTY (02/03/2022); Esophagogastroduodenoscopy (egd) with propofol (N/A, 10/21/2022); Colonoscopy with propofol (N/A, 10/21/2022); biopsy (10/21/2022); polypectomy (10/21/2022); Givens capsule study (N/A, 05/31/2023); and Colonoscopy with propofol (N/A, 06/02/2023). Family History:  Her family history includes Breast cancer (age of onset: 75) in her sister; Colon cancer in her mother; Diabetes in her father and sister; Hypertension in her sister. Social History:   reports that she has been smoking cigarettes. She has a 40 pack-year smoking history. She has never used smokeless tobacco. She reports that she does not drink alcohol and does not use drugs.  Prior to Admission medications   Medication Sig Start Date End Date Taking? Authorizing Provider  acetaminophen (TYLENOL) 500 MG tablet Take 500 mg by mouth 2 (two) times daily as needed for headache or moderate pain.    [provider]   ALPRAZolam Prudy Feeler) 0.5 MG tablet Take 1 tablet (0.5 mg total) by mouth 3 (three) times daily as needed for anxiety. 06/04/23   Arrien, York Ram, MD  amLODipine (NORVASC) 5 MG tablet Take 2 tablets (10 mg total) by mouth daily. Patient taking differently: Take 10 mg by mouth 2 (two) times daily. 10/21/22   Shalhoub, Deno Lunger, MD  Budeson-Glycopyrrol-Formoterol (BREZTRI AEROSPHERE) 160-9-4.8 MCG/ACT AERO Inhale 2 puffs into the lungs daily as needed (SOB).    [provider]  cloNIDine (CATAPRES) 0.2 MG tablet Take 1 tablet (0.2 mg total) by mouth 2 (two) times daily. Patient taking differently: Take 0.2 mg by mouth at bedtime. 06/04/23 07/04/23  Arrien, York Ram, MD  cyanocobalamin (VITAMIN B12) 1000 MCG tablet Take 1 tablet (1,000 mcg total) by mouth daily. 10/21/22   Shalhoub, Deno Lunger, MD  dapagliflozin propanediol (FARXIGA) 10 MG TABS tablet Take 1 tablet (10 mg total) by mouth daily. 06/05/23   Arrien, York Ram, MD  ezetimibe (ZETIA) 10 MG tablet Take 1 tablet (10 mg total) by mouth daily. 06/21/23 07/21/23  Yates Decamp, MD  ferrous sulfate 325 (65 FE) MG tablet Take 1 tablet (325 mg total) by mouth daily before breakfast. 10/21/22 10/21/23  Shalhoub, Deno Lunger, MD  furosemide (LASIX) 40 MG tablet Take 1 tablet (40 mg total) by mouth daily. 06/05/23   Arrien, Mauricio  Reuel Boom, MD  hydrALAZINE (APRESOLINE) 100 MG tablet Take 1 tablet (100 mg total) by mouth 3 (three) times daily. 06/04/23 07/04/23  Arrien, York Ram, MD  HYDROcodone-acetaminophen (NORCO/VICODIN) 5-325 MG tablet Take 0.5 tablets by mouth 2 (two) times daily as needed for moderate pain.    [provider]  isosorbide dinitrate (ISORDIL) 30 MG tablet Take 1 tablet (30 mg total) by mouth 3 (three) times daily. Hold if BP <140/80 06/21/23   Yates Decamp, MD  labetalol (NORMODYNE) 300 MG tablet Take 1 tablet (300 mg total) by mouth 3 (three) times daily. 06/21/23   Yates Decamp, MD  levothyroxine (SYNTHROID)  100 MCG tablet Take 100 mcg by mouth daily before breakfast.    [provider]  pantoprazole (PROTONIX) 40 MG tablet Take 1 tablet by mouth 2 times daily before a meal for 30 days, THEN take1 tablet daily. Patient taking differently: Take 1 tablet by mouth daily. 10/21/22 06/21/23  Shalhoub, Deno Lunger, MD  rosuvastatin (CRESTOR) 40 MG tablet Take 40 mg by mouth daily. 09/14/22   [provider]  sodium chloride (AYR) 0.65 % nasal spray Place 1 spray into the nose daily as needed for congestion.    [provider]  spironolactone (ALDACTONE) 25 MG tablet Take 1 tablet (25 mg total) by mouth daily. 06/21/23 07/21/23  Yates Decamp, MD    Current Facility-Administered Medications  Medication Dose Route Frequency Provider Last Rate Last Admin   diphenhydrAMINE (BENADRYL) injection 25 mg  25 mg Intravenous Once Roemhildt, Lorin T, PA-C       pantoprazole (PROTONIX) 80 mg /NS 100 mL IVPB  80 mg Intravenous Once Roemhildt, Lorin T, PA-C       [START ON 07/14/2023] pantoprazole (PROTONIX) injection 40 mg  40 mg Intravenous Q12H Roemhildt, Lorin T, PA-C       pantoprozole (PROTONIX) 80 mg /NS 100 mL infusion  8 mg/hr Intravenous Continuous Roemhildt, Lorin T, PA-C       prochlorperazine (COMPAZINE) injection 10 mg  10 mg Intravenous Once Roemhildt, Lorin T, PA-C       Current Outpatient Medications  Medication Sig Dispense Refill   acetaminophen (TYLENOL) 500 MG tablet Take 500 mg by mouth 2 (two) times daily as needed for headache or moderate pain.     ALPRAZolam (XANAX) 0.5 MG tablet Take 1 tablet (0.5 mg total) by mouth 3 (three) times daily as needed for anxiety. 10 tablet 0   amLODipine (NORVASC) 5 MG tablet Take 2 tablets (10 mg total) by mouth daily. (Patient taking differently: Take 10 mg by mouth 2 (two) times daily.) 30 tablet 1   Budeson-Glycopyrrol-Formoterol (BREZTRI AEROSPHERE) 160-9-4.8 MCG/ACT AERO Inhale 2 puffs into the lungs daily as needed (SOB).     cloNIDine  (CATAPRES) 0.2 MG tablet Take 1 tablet (0.2 mg total) by mouth 2 (two) times daily. (Patient taking differently: Take 0.2 mg by mouth at bedtime.) 60 tablet 0   cyanocobalamin (VITAMIN B12) 1000 MCG tablet Take 1 tablet (1,000 mcg total) by mouth daily. 30 tablet 1   dapagliflozin propanediol (FARXIGA) 10 MG TABS tablet Take 1 tablet (10 mg total) by mouth daily. 30 tablet 0   ezetimibe (ZETIA) 10 MG tablet Take 1 tablet (10 mg total) by mouth daily. 90 tablet 3   ferrous sulfate 325 (65 FE) MG tablet Take 1 tablet (325 mg total) by mouth daily before breakfast. 30 tablet 3   furosemide (LASIX) 40 MG tablet Take 1 tablet (40 mg total) by mouth daily.  30 tablet 0   hydrALAZINE (APRESOLINE) 100 MG tablet Take 1 tablet (100 mg total) by mouth 3 (three) times daily. 90 tablet 0   HYDROcodone-acetaminophen (NORCO/VICODIN) 5-325 MG tablet Take 0.5 tablets by mouth 2 (two) times daily as needed for moderate pain.     isosorbide dinitrate (ISORDIL) 30 MG tablet Take 1 tablet (30 mg total) by mouth 3 (three) times daily. Hold if BP <140/80 270 tablet 2   labetalol (NORMODYNE) 300 MG tablet Take 1 tablet (300 mg total) by mouth 3 (three) times daily. 270 tablet 1   levothyroxine (SYNTHROID) 100 MCG tablet Take 100 mcg by mouth daily before breakfast.     pantoprazole (PROTONIX) 40 MG tablet Take 1 tablet by mouth 2 times daily before a meal for 30 days, THEN take1 tablet daily. (Patient taking differently: Take 1 tablet by mouth daily.) 90 tablet 0   rosuvastatin (CRESTOR) 40 MG tablet Take 40 mg by mouth daily.     sodium chloride (AYR) 0.65 % nasal spray Place 1 spray into the nose daily as needed for congestion.     spironolactone (ALDACTONE) 25 MG tablet Take 1 tablet (25 mg total) by mouth daily. 90 tablet 1    Allergies as of 07/18/2023 - Review Complete 07/25/2023  Allergen Reaction Noted   Doxycycline Anaphylaxis and Rash 08/27/2021   Azilsartan Cough 01/31/2022   Biaxin [clarithromycin] Diarrhea  and Other (See Comments) 10/23/2020   Floxacillin [floxacillin (flucloxacillin)] Other (See Comments) 10/23/2020   Morphine and codeine Other (See Comments) 10/23/2020    Review of Systems  Constitutional:  Positive for malaise/fatigue. Negative for chills, fever and weight loss.  HENT:  Negative for hearing loss and tinnitus.   Eyes:  Negative for blurred vision and double vision.  Respiratory:  Negative for cough and hemoptysis.   Cardiovascular:  Negative for chest pain and palpitations.  Gastrointestinal:  Positive for abdominal pain, constipation, melena and nausea. Negative for blood in stool, diarrhea, heartburn and vomiting.  Genitourinary:  Negative for dysuria and urgency.  Musculoskeletal:  Negative for myalgias and neck pain.  Skin:  Negative for itching and rash.  Neurological:  Positive for dizziness. Negative for headaches.  Psychiatric/Behavioral:  Negative for depression and suicidal ideas.        Physical Exam:  Vital signs in last 24 hours: Temp:  [99.1 F (37.3 C)] 99.1 F (37.3 C) (09/02 1429) Pulse Rate:  [92-100] 93 (09/02 1430) Resp:  [13-34] 17 (09/02 1430) BP: (195-220)/(58-64) 216/58 (09/02 1430) SpO2:  [94 %-99 %] 94 % (09/02 1430) Weight:  [61.7 kg] 61.7 kg (09/02 1248)   Last BM recorded by nurses in past 5 days No data recorded  Physical Exam Constitutional:      Appearance: She is ill-appearing.  HENT:     Head: Normocephalic and atraumatic.     Nose: Nose normal. No congestion.     Mouth/Throat:     Mouth: Mucous membranes are moist.     Pharynx: Oropharynx is clear.  Eyes:     Extraocular Movements: Extraocular movements intact.     Comments: Conjunctival pallor  Cardiovascular:     Rate and Rhythm: Normal rate and regular rhythm.  Pulmonary:     Effort: Pulmonary effort is normal. No respiratory distress.  Abdominal:     Palpations: Abdomen is soft.     Comments: Mild epigastric tenderness.  Hypoactive bowel sounds   Musculoskeletal:        General: No swelling. Normal range of motion.  Cervical back: Normal range of motion and neck supple.  Skin:    General: Skin is warm and dry.     Coloration: Skin is pale.  Neurological:     General: No focal deficit present.     Mental Status: She is alert and oriented to person, place, and time.  Psychiatric:        Mood and Affect: Mood normal.        Behavior: Behavior normal.        Thought Content: Thought content normal.        Judgment: Judgment normal.      LAB RESULTS: Recent Labs    07/31/2023 1256  WBC 10.5  HGB 5.8*  HCT 18.9*  PLT 246   BMET Recent Labs    08/06/2023 1256  NA 131*  K 3.2*  CL 95*  CO2 23  GLUCOSE 118*  BUN 17  CREATININE 0.97  CALCIUM 8.7*   LFT Recent Labs    07/24/2023 1256  PROT 6.1*  ALBUMIN 3.9  AST 24  ALT 21  ALKPHOS 40  BILITOT 0.8   PT/INR No results for input(s): "LABPROT", "INR" in the last 72 hours.  STUDIES: DG Chest 2 View  Result Date: 08/01/2023 CLINICAL DATA:  Shortness of breath EXAM: CHEST - 2 VIEW COMPARISON:  Chest radiograph dated 05/31/2023. FINDINGS: The heart is enlarged. Vascular calcifications are seen in the aortic arch. There is a small left pleural effusion with associated atelectasis, decreased compared to prior exam. There is no right pleural effusion and the right lung is clear. There is no pneumothorax on either side. Degenerative changes are seen in the spine. IMPRESSION: Small left pleural effusion with associated atelectasis, decreased compared to prior exam. Electronically Signed   By: Romona Curls M.D.   On: 07/18/2023 14:11      Impression    67 year old female history of COPD, HFpEF, uncontrolled hypertension (systolic 200s), IDA with hemoglobin baseline around 10 presents for evaluation of symptomatic anemia.  Recent admission for same end of July with VCE showing active bleed in terminal ileum and colonoscopy with poor prep.  Admission 10/2022 for same with  EGD/colonoscopy unrevealing.  Acute on chronic symptomatic anemia - Hgb 5.8 (10.2 06/03/2023), receiving transfusion -MCV 88.7 - BUN 17, creatinine 0.97 - Sodium 131, potassium 3.2 - Lipase 36 - Chest x-ray shows small left pleural effusion (improved from last exam) With recurrent acute on chronic anemia with questionable bleeding, heme positive stool, and VCE recent showing possible bleed in terminal ileum, could raise question for recurrently bleeding Meckel's Diverticulum. DDX also includes esophagitis, gastritis, PUD, AVMs, etc.  Constipation (likely secondary to iron supplementation)  Uncontrolled HTN - per primary team   Plan   - plan for Meckel's Scan for further evaluation - Continue daily CBC and transfuse as needed to maintain HGB > 7  - miralax 17G daily - zofran PRN for nausea - Protonix 40mg  IV BID - Dr. Mann/Dr. Elnoria Howard will resume care tomorrow  Thank you for your kind consultation, we will continue to follow.   Bayley Leanna Sato  07/25/2023, 2:43 PM

## 2023-07-10 NOTE — Progress Notes (Signed)
During bedside report, patient notably SOB with increased WOB. Expiratory wheezes with crackles noted upon assessment. 2nd unit of blood transfusion was stopped by previous RN for suspected fluid overload and pt had just received IV Lasix. Current BP 238/90. Dr. Julian Reil notified and orders received for Labetalol and BIPAP. Dr. Julian Reil, RT and Rapid RN came to bedside to assess patient.  Despite administration of 20mg  Hydralazine and 15mg  Labetalol, patient's blood pressure remains 220-230/70. PCCM consulted and orders received to transfer patient to ICU. Patient transferred from 5W-12 to 82M-9 at 2210, belongings sent with patient.    Dr. Julian Reil instructed staff to follow transfusion reaction protocol on units of blood. Blood bank notified. Transfusion reaction paperwork sent down, as well as second unit of blood along with NS and tubing. Phlebotomy came to bedside to draw needed blood work.  Patient unable to void while on this department but passed along to receiving RN that urine sample is needed for further workup.

## 2023-07-10 NOTE — ED Provider Notes (Signed)
[place] EMERGENCY DEPARTMENT AT [facility] Provider Note   CSN: [id number] Arrival date & time: 07/27/2023  1236     History  Chief Complaint  Patient presents with   Weakness    Gwendolyn [name] is a 67 y.o. female with history of CHF (LVEF 50-55%, July 2024), resistant hypertension, COPD, peripheral artery disease, anemia, hypothyroidism, hyperlipidemia, renal artery stenosis, abdominal aortic stenosis, GI bleed, who presents the emergency department complaining of generalized weakness, abdominal discomfort, and shortness of breath.  Patient states that she recently was put back on iron supplements for low hemoglobin levels.  She believes her hemoglobin had dropped to 9, and her baseline is around 10.  She was put on supplements, which made her fairly constipated.  She states about 3 days ago she had a very large "blowout" bowel movement.  It was dark in color, but she is not sure if this was due to the iron supplements.  Ever since then she has felt more fatigued, dyspneic on exertion, and feeling bloated.  She is felt very nauseated but has not vomited.  She has a mild nonproductive cough.  She is overall just not feeling well.  States her BP at home when she checks it often has systolics in the 200's. She has not taken her BP meds today as her stomach was bothering her.    Weakness Associated symptoms: abdominal pain, nausea and shortness of breath        Home Medications Prior to Admission medications   Medication Sig Start Date End Date Taking? Authorizing Provider  acetaminophen (TYLENOL) 500 MG tablet Take 500 mg by mouth 2 (two) times daily as needed for headache or moderate pain.    [provider]  ALPRAZolam Prudy Feeler) 0.5 MG tablet Take 1 tablet (0.5 mg total) by mouth 3 (three) times daily as needed for anxiety. 06/04/23   Arrien, York Ram, MD  amLODipine (NORVASC) 5 MG tablet Take 2 tablets (10 mg total) by mouth daily. Patient taking  differently: Take 10 mg by mouth 2 (two) times daily. 10/21/22   Shalhoub, Deno Lunger, MD  Budeson-Glycopyrrol-Formoterol (BREZTRI AEROSPHERE) 160-9-4.8 MCG/ACT AERO Inhale 2 puffs into the lungs daily as needed (SOB).    [provider]  cloNIDine (CATAPRES) 0.2 MG tablet Take 1 tablet (0.2 mg total) by mouth 2 (two) times daily. Patient taking differently: Take 0.2 mg by mouth at bedtime. 06/04/23 07/04/23  Arrien, York Ram, MD  cyanocobalamin (VITAMIN B12) 1000 MCG tablet Take 1 tablet (1,000 mcg total) by mouth daily. 10/21/22   Shalhoub, Deno Lunger, MD  dapagliflozin propanediol (FARXIGA) 10 MG TABS tablet Take 1 tablet (10 mg total) by mouth daily. 06/05/23   Arrien, York Ram, MD  ezetimibe (ZETIA) 10 MG tablet Take 1 tablet (10 mg total) by mouth daily. 06/21/23 07/21/23  Yates Decamp, MD  ferrous sulfate 325 (65 FE) MG tablet Take 1 tablet (325 mg total) by mouth daily before breakfast. 10/21/22 10/21/23  Shalhoub, Deno Lunger, MD  furosemide (LASIX) 40 MG tablet Take 1 tablet (40 mg total) by mouth daily. 06/05/23   Arrien, York Ram, MD  hydrALAZINE (APRESOLINE) 100 MG tablet Take 1 tablet (100 mg total) by mouth 3 (three) times daily. 06/04/23 07/04/23  Arrien, York Ram, MD  HYDROcodone-acetaminophen (NORCO/VICODIN) 5-325 MG tablet Take 0.5 tablets by mouth 2 (two) times daily as needed for moderate pain.    [provider]  isosorbide dinitrate (ISORDIL) 30 MG tablet Take 1 tablet (30 mg total)  by mouth 3 (three) times daily. Hold if BP <140/80 06/21/23   Yates Decamp, MD  labetalol (NORMODYNE) 300 MG tablet Take 1 tablet (300 mg total) by mouth 3 (three) times daily. 06/21/23   Yates Decamp, MD  levothyroxine (SYNTHROID) 100 MCG tablet Take 100 mcg by mouth daily before breakfast.    [provider]  pantoprazole (PROTONIX) 40 MG tablet Take 1 tablet by mouth 2 times daily before a meal for 30 days, THEN take1 tablet daily. Patient taking differently: Take 1  tablet by mouth daily. 10/21/22 06/21/23  Shalhoub, Deno Lunger, MD  rosuvastatin (CRESTOR) 40 MG tablet Take 40 mg by mouth daily. 09/14/22   [provider]  sodium chloride (AYR) 0.65 % nasal spray Place 1 spray into the nose daily as needed for congestion.    [provider]  spironolactone (ALDACTONE) 25 MG tablet Take 1 tablet (25 mg total) by mouth daily. 06/21/23 07/21/23  Yates Decamp, MD      Allergies    Doxycycline, Azilsartan, Biaxin [clarithromycin], Floxacillin [floxacillin (flucloxacillin)], and Morphine and codeine    Review of Systems   Review of Systems  Constitutional:  Positive for fatigue.  Respiratory:  Positive for shortness of breath.   Gastrointestinal:  Positive for abdominal pain and nausea.  Neurological:  Positive for weakness.  All other systems reviewed and are negative.   Physical Exam Updated Vital Signs BP (!) 181/63   Pulse 92   Temp 99.9 F (37.7 C) (Oral)   Resp 20   Ht 5\' 1"  (1.549 m)   Wt 61.7 kg   LMP  (LMP Unknown)   SpO2 94%   BMI 25.70 kg/m  Physical Exam Vitals and nursing note reviewed. Exam conducted with a chaperone present.  Constitutional:      Appearance: Normal appearance.  HENT:     Head: Normocephalic and atraumatic.  Eyes:     Conjunctiva/sclera: Conjunctivae normal.  Cardiovascular:     Rate and Rhythm: Regular rhythm. Tachycardia present.  Pulmonary:     Effort: Pulmonary effort is normal. No respiratory distress.     Breath sounds: Normal breath sounds.  Abdominal:     General: There is no distension.     Palpations: Abdomen is soft.     Tenderness: There is generalized abdominal tenderness and tenderness in the epigastric area.  Genitourinary:    Rectum: Guaiac result positive.  Skin:    General: Skin is warm and dry.     Coloration: Skin is pale.  Neurological:     General: No focal deficit present.     Mental Status: She is alert.     ED Results / Procedures / Treatments   Labs (all labs  ordered are listed, but only abnormal results are displayed) Labs Reviewed  CBC - Abnormal; Notable for the following components:      Result Value   RBC 2.13 (*)    Hemoglobin 5.8 (*)    HCT 18.9 (*)    All other components within normal limits  COMPREHENSIVE METABOLIC PANEL - Abnormal; Notable for the following components:   Sodium 131 (*)    Potassium 3.2 (*)    Chloride 95 (*)    Glucose, Bld 118 (*)    Calcium 8.7 (*)    Total Protein 6.1 (*)    All other components within normal limits  BRAIN NATRIURETIC PEPTIDE - Abnormal; Notable for the following components:   B Natriuretic Peptide 1,813.7 (*)    All other components within  normal limits  POC OCCULT BLOOD, ED - Abnormal; Notable for the following components:   Fecal Occult Bld POSITIVE (*)    All other components within normal limits  TROPONIN I (HIGH SENSITIVITY) - Abnormal; Notable for the following components:   Troponin I (High Sensitivity) 95 (*)    All other components within normal limits  LIPASE, BLOOD  MAGNESIUM  I-STAT CG4 LACTIC ACID, ED  TYPE AND SCREEN  PREPARE RBC (CROSSMATCH)  TROPONIN I (HIGH SENSITIVITY)    EKG EKG Interpretation Date/Time:  Monday July 10 2023 12:53:06 EDT Ventricular Rate:  97 PR Interval:  144 QRS Duration:  158 QT Interval:  445 QTC Calculation: 566 R Axis:   -84  Text Interpretation: Sinus rhythm Left bundle branch block when compared to prior,  new LBBB. No STEMI Confirmed by Theda Belfast ([id number]) on 07/23/2023 12:56:07 PM  Radiology DG Chest 2 View  Result Date: 08/07/2023 CLINICAL DATA:  Shortness of breath EXAM: CHEST - 2 VIEW COMPARISON:  Chest radiograph dated 05/31/2023. FINDINGS: The heart is enlarged. Vascular calcifications are seen in the aortic arch. There is a small left pleural effusion with associated atelectasis, decreased compared to prior exam. There is no right pleural effusion and the right lung is clear. There is no pneumothorax on either side.  Degenerative changes are seen in the spine. IMPRESSION: Small left pleural effusion with associated atelectasis, decreased compared to prior exam. Electronically Signed   By: Romona Curls M.D.   On: 07/17/2023 14:11    Procedures .Critical Care  Performed by: Su Monks, PA-C Authorized by: Su Monks, PA-C   Critical care provider statement:    Critical care time (minutes):  30   Critical care was necessary to treat or prevent imminent or life-threatening deterioration of the following conditions:  Circulatory failure   Critical care was time spent personally by me on the following activities:  Development of treatment plan with patient or surrogate, discussions with consultants, evaluation of patient's response to treatment, examination of patient, ordering and review of laboratory studies, ordering and review of radiographic studies, ordering and performing treatments and interventions, pulse oximetry, re-evaluation of patient's condition and review of old charts   Care discussed with: admitting provider   Comments:     Symptomatic anemia with hemoglobin 5.8 requiring RBC transfusion     Medications Ordered in ED Medications  pantoprazole (PROTONIX) 80 mg /NS 100 mL IVPB (has no administration in time range)  pantoprozole (PROTONIX) 80 mg /NS 100 mL infusion (has no administration in time range)  pantoprazole (PROTONIX) injection 40 mg (has no administration in time range)  prochlorperazine (COMPAZINE) injection 10 mg (has no administration in time range)  diphenhydrAMINE (BENADRYL) injection 25 mg (has no administration in time range)  ezetimibe (ZETIA) tablet 10 mg (has no administration in time range)  rosuvastatin (CRESTOR) tablet 40 mg (has no administration in time range)  spironolactone (ALDACTONE) tablet 25 mg (has no administration in time range)  levothyroxine (SYNTHROID) tablet 100 mcg (has no administration in time range)  Budeson-Glycopyrrol-Formoterol  160-9-4.8 MCG/ACT AERO 2 puff (has no administration in time range)  sodium chloride flush (NS) 0.9 % injection 3 mL (has no administration in time range)  acetaminophen (TYLENOL) tablet 650 mg (has no administration in time range)    Or  acetaminophen (TYLENOL) suppository 650 mg (has no administration in time range)  potassium chloride SA (KLOR-CON M) CR tablet 40 mEq (has no administration in time range)  furosemide (LASIX) tablet 40  mg (has no administration in time range)  cloNIDine (CATAPRES) tablet 0.2 mg (has no administration in time range)  0.9 %  sodium chloride infusion (Manually program via Guardrails IV Fluids) ( Intravenous New Bag/Given 08/01/2023 1436)    ED Course/ Medical Decision Making/ A&P                                 Medical Decision Making Amount and/or Complexity of Data Reviewed Labs: ordered. Radiology: ordered.   This patient is a 67 y.o. female who presents to the ED for concern of generalized weakness, this involves an extensive number of treatment options, and is a complaint that carries with it a high risk of complications and morbidity. The emergent differential diagnosis prior to evaluation includes, but is not limited to,  ACS, arrhythmia, syncope, orthostatic hypotension, sepsis, hypoglycemia, hypoxia, electrolyte disturbance, endocrine disorder, anemia, polypharmacy. This is not an exhaustive differential.   Past Medical History / Co-morbidities / Social History: CHF (LVEF 50-55%, July 2024), resistant hypertension, COPD, peripheral artery disease, anemia, hypothyroidism, hyperlipidemia, renal artery stenosis, abdominal aortic stenosis, GI bleed  Additional history: Chart reviewed. Pertinent results include: Patient was discharged on 7/28 from this hospital in the setting of acute on chronic diastolic heart failure, lower GI bleed, hypertensive urgency, AKI, COPD.  Follows with Dr. Jacinto Halim with cardiology, reviewed note from 8/14.  He believes that patient  needs repeat intravascular lithotripsy due to her severe abdominal aortic stenosis leading to resistant hypertension, however feels that she is not medically stable enough to do it right now.  Physical Exam: Physical exam performed. The pertinent findings include: Patient tachycardic, very hypertensive.  She appears very pale.  Abdomen soft, with generalized tenderness, worse in the epigastrium, without rebound or guarding.  Lung sounds clear.  Lab Tests: I ordered, and personally interpreted labs.  The pertinent results include: Leukocytosis, hemoglobin 5.8, down from 10.2 one month ago.  CMP grossly unremarkable, mildly depleted sodium, potassium, and chloride.  Initial troponin 95, suspect demand ischemia in setting of anemia.  Hemoccult positive.  Lactic acid 1.0.   Imaging Studies: I ordered imaging studies including chest x-ray. I independently visualized and interpreted imaging which showed small left pleural effusion with associated atelectasis, decreased compared to prior exam. I agree with the radiologist interpretation.   Cardiac Monitoring:  The patient was maintained on a cardiac monitor.  My attending physician Dr. Rush Landmark viewed and interpreted the cardiac monitored which showed an underlying rhythm of: sinus rhythm with new LBBB, no STEMI. I agree with this interpretation.   Medications: I ordered medication including RBC infusion, protonix, compazine, benadryl for symptomatic anemia and nausea. I have reviewed the patients home medicines and have made adjustments as needed.  Consultations Obtained: I requested consultation with the [facility] gastroenter[facility]  and discussed lab and imaging findings as well as pertinent plan for admission. They will consult and follow patient once admitted.  I consulted with hospitalist Dr Alinda Money who will admit.    Disposition: After consideration of the diagnostic results and the patients response to treatment, I feel that  patient is requiring hospital admission for symptomatic anemia with positive hemoccult, elevated troponin, elevated BNP, and new EKG changes.   I discussed this case with my attending physician Dr. Rush Landmark who cosigned this note including patient's presenting symptoms, physical exam and planned diagnostics and interventions. Attending physician stated agreement with plan or made changes to plan which  were implemented.    Final Clinical Impression(s) / ED Diagnoses Final diagnoses:  Gastrointestinal hemorrhage, unspecified gastrointestinal hemorrhage type  Symptomatic anemia  Weakness  Resistant hypertension    Rx / DC Orders ED Discharge Orders     None      Portions of this report may have been transcribed using voice recognition software. Every effort was made to ensure accuracy; however, inadvertent computerized transcription errors may be present.    Jeanella Flattery 08/04/2023 1537    Tegeler, Canary Brim, MD 08/06/2023 [phone]

## 2023-07-10 NOTE — ED Notes (Signed)
ED TO INPATIENT HANDOFF REPORT  ED Nurse Name and Phone #: Osvaldo Shipper RN [phone]  S Name/Age/Gender Gwendolyn [name] 67 y.o. female Room/Bed: 006C/006C  Code Status   Code Status: Full Code  Home/SNF/Other Home Patient oriented to: self, place, time, and situation Is this baseline? Yes   Triage Complete: Triage complete  Chief Complaint GI bleed [K92.2]  Triage Note PT BIB EMS from home for bloating, anorexia and nausea. Pt reports last bowel movement was Friday, and I taking iron for recent low hemoglobin.   EMS 220/80 100 HR 97% RA 146 BG    Allergies Allergies  Allergen Reactions   Doxycycline Anaphylaxis and Rash   Azilsartan Cough   Biaxin [Clarithromycin] Diarrhea and Other (See Comments)    Vertigo   Floxacillin [Floxacillin (Flucloxacillin)] Other (See Comments)    Vertigo   Morphine And Codeine Other (See Comments)    Headache    Level of Care/Admitting Diagnosis ED Disposition     ED Disposition  Admit   Condition  --   Comment  Hospital Area: [facility] [[id number]]  Level of Care: Progressive [102]  Admit to Progressive based on following criteria: GI, ENDOCRINE disease patients with GI bleeding, acute liver failure or pancreatitis, stable with diabetic ketoacidosis or thyrotoxicosis (hypothyroid) state.  May place patient in observation at [facility] or [facility] if equivalent level of care is available:: No  Covid Evaluation: Asymptomatic - no recent exposure (last 10 days) testing not required  Diagnosis: GI bleed [710626]  Admitting Physician: Synetta Fail [[id number]]  Attending Physician: Synetta Fail [[id number]]          B Medical/Surgery History Past Medical History:  Diagnosis Date   Acute exacerbation of chronic obstructive pulmonary disease (COPD) (HCC) 05/28/2023   Acute on chronic diastolic CHF (congestive heart failure) (HCC) 10/18/2022   Anemia    Anxiety    Asthma    COPD (chronic  obstructive pulmonary disease) (HCC)    Depression    GERD (gastroesophageal reflux disease)    History of kidney stones    Hypertension    Hypothyroidism    Past Surgical History:  Procedure Laterality Date   ABDOMINAL AORTOGRAM W/LOWER EXTREMITY N/A 02/03/2022   Procedure: ABDOMINAL AORTOGRAM W/LOWER EXTREMITY;  Surgeon: Yates Decamp, MD;  Location: [facility] INVASIVE CV LAB;  Service: Cardiovascular;  Laterality: N/A;   ABDOMINAL HYSTERECTOMY     BIOPSY  10/21/2022   Procedure: BIOPSY;  Surgeon: Jeani Hawking, MD;  Location: [facility] ENDOSCOPY;  Service: Gastroenterology;;   Cesarean Section     (2)   CHOLECYSTECTOMY     COLONOSCOPY  2020   COLONOSCOPY WITH PROPOFOL N/A 10/21/2022   Procedure: COLONOSCOPY WITH PROPOFOL;  Surgeon: Jeani Hawking, MD;  Location: [facility] ENDOSCOPY;  Service: Gastroenterology;  Laterality: N/A;   COLONOSCOPY WITH PROPOFOL N/A 06/02/2023   Procedure: COLONOSCOPY WITH PROPOFOL;  Surgeon: Jeani Hawking, MD;  Location: [facility] ENDOSCOPY;  Service: Gastroenterology;  Laterality: N/A;   ESOPHAGOGASTRODUODENOSCOPY (EGD) WITH PROPOFOL N/A 10/21/2022   Procedure: ESOPHAGOGASTRODUODENOSCOPY (EGD) WITH PROPOFOL;  Surgeon: Jeani Hawking, MD;  Location: [facility] ENDOSCOPY;  Service: Gastroenterology;  Laterality: N/A;   Gastric Ulcer  2020   GIVENS CAPSULE STUDY N/A 05/31/2023   Procedure: GIVENS CAPSULE STUDY;  Surgeon: Jeani Hawking, MD;  Location: [facility] ENDOSCOPY;  Service: Gastroenterology;  Laterality: N/A;   PERIPHERAL VASCULAR BALLOON ANGIOPLASTY  02/03/2022   Procedure: PERIPHERAL VASCULAR BALLOON ANGIOPLASTY;  Surgeon: Yates Decamp, MD;  Location: [facility] INVASIVE CV LAB;  Service:  Cardiovascular;;   POLYPECTOMY  10/21/2022   Procedure: POLYPECTOMY;  Surgeon: Jeani Hawking, MD;  Location: [facility] ENDOSCOPY;  Service: Gastroenterology;;   RENAL ANGIOGRAPHY N/A 02/03/2022   Procedure: RENAL ANGIOGRAPHY;  Surgeon: Yates Decamp, MD;  Location: [facility] INVASIVE CV LAB;  Service: Cardiovascular;  Laterality: N/A;    UMBILICAL HERNIA REPAIR N/A 10/29/2020   Procedure: OPEN UMBILICAL HERNIA REPAIR WITH MESH;  Surgeon: Stechschulte, Hyman Hopes, MD;  Location: [facility] OR;  Service: General;  Laterality: N/A;     A IV Location/Drains/Wounds Patient Lines/Drains/Airways Status     Active Line/Drains/Airways     Name Placement date Placement time Site Days   Peripheral IV 08/05/2023 20 G Posterior;Right Forearm 07/17/2023  1302  Forearm  less than 1   Peripheral IV 07/21/2023 20 G Left;Posterior Hand 07/18/2023  1327  Hand  less than 1   Wound / Incision (Open or Dehisced) 10/19/22 Other (Comment) Arm Anterior;Left;Upper shingles diagnosed 12/7, currently just a red rash and not open or draining 10/19/22  1414  Arm  264            Intake/Output Last 24 hours No intake or output data in the 24 hours ending 08/06/2023 1541  Labs/Imaging Results for orders placed or performed during the hospital encounter of 07/13/2023 (from the past 48 hour(s))  CBC     Status: Abnormal   Collection Time: 07/19/2023 12:56 PM  Result Value Ref Range   WBC 10.5 4.0 - 10.5 K/uL   RBC 2.13 (L) 3.87 - 5.11 MIL/uL   Hemoglobin 5.8 (LL) 12.0 - 15.0 g/dL    Comment: REPEATED TO VERIFY THIS CRITICAL RESULT HAS VERIFIED AND BEEN CALLED TO C. Sharol Croghan RN BY MARY ALAMANO ON 09 02 2024 AT 1357, AND HAS BEEN READ BACK.     HCT 18.9 (L) 36.0 - 46.0 %   MCV 88.7 80.0 - 100.0 fL   MCH 27.2 26.0 - 34.0 pg   MCHC 30.7 30.0 - 36.0 g/dL   RDW 16.1 09.6 - 04.5 %   Platelets 246 150 - 400 K/uL   nRBC 0.0 0.0 - 0.2 %    Comment: Performed at [facility], [address], [place], [place] [address]  Comprehensive metabolic panel     Status: Abnormal   Collection Time: 07/16/2023 12:56 PM  Result Value Ref Range   Sodium 131 (L) 135 - 145 mmol/L   Potassium 3.2 (L) 3.5 - 5.1 mmol/L   Chloride 95 (L) 98 - 111 mmol/L   CO2 23 22 - 32 mmol/L   Glucose, Bld 118 (H) 70 - 99 mg/dL    Comment: Glucose reference range applies only to samples taken after  fasting for at least 8 hours.   BUN 17 8 - 23 mg/dL   Creatinine, Ser 1.91 0.44 - 1.00 mg/dL   Calcium 8.7 (L) 8.9 - 10.3 mg/dL   Total Protein 6.1 (L) 6.5 - 8.1 g/dL   Albumin 3.9 3.5 - 5.0 g/dL   AST 24 15 - 41 U/L   ALT 21 0 - 44 U/L   Alkaline Phosphatase 40 38 - 126 U/L   Total Bilirubin 0.8 0.3 - 1.2 mg/dL   GFR, Estimated >47 >82 mL/min    Comment: (NOTE) Calculated using the CKD-EPI Creatinine Equation (2021)    Anion gap 13 5 - 15    Comment: Performed at [facility], [address], [place], [place] [address]  Lipase, blood     Status: None  Collection Time: 08/05/2023 12:56 PM  Result Value Ref Range   Lipase 36 11 - 51 U/L    Comment: Performed at [facility], [address] [place], [place] [address]  Brain natriuretic peptide     Status: Abnormal   Collection Time: 07/22/2023 12:56 PM  Result Value Ref Range   B Natriuretic Peptide 1,813.7 (H) 0.0 - 100.0 pg/mL    Comment: Performed at [facility], [address] [place], [place] [address]  Troponin I (High Sensitivity)     Status: Abnormal   Collection Time: 07/30/2023 12:56 PM  Result Value Ref Range   Troponin I (High Sensitivity) 95 (H) <18 ng/L    Comment: (NOTE) Elevated high sensitivity troponin I (hsTnI) values and significant  changes across serial measurements may suggest ACS but many other  chronic and acute conditions are known to elevate hsTnI results.  Refer to the "Links" section for chest pain algorithms and additional  guidance. Performed at [facility], [address] [place], [place] [address]   Type and screen [facility]     Status: None (Preliminary result)   Collection Time: 07/31/2023  1:00 PM  Result Value Ref Range   ABO/RH(D) O POS    Antibody Screen NEG    Sample Expiration 07/13/2023,2359    Unit Number [id number]    Blood Component Type RED CELLS,LR    Unit division 00    Status of Unit ALLOCATED    Transfusion Status OK TO  TRANSFUSE    Crossmatch Result Compatible    Unit Number [id number]    Blood Component Type RED CELLS,LR    Unit division 00    Status of Unit ISSUED    Transfusion Status OK TO TRANSFUSE    Crossmatch Result      Compatible Performed at [facility], [address] [place], [place] [address]   POC occult blood, ED     Status: Abnormal   Collection Time: 08/07/2023  1:09 PM  Result Value Ref Range   Fecal Occult Bld POSITIVE (A) NEGATIVE  I-Stat CG4 Lactic Acid     Status: None   Collection Time: 07/29/2023  1:10 PM  Result Value Ref Range   Lactic Acid, Venous 1.0 0.5 - 1.9 mmol/L  Prepare RBC (crossmatch)     Status: None   Collection Time: 07/26/2023  2:30 PM  Result Value Ref Range   Order Confirmation      ORDERS RECEIVED TO CROSSMATCH Performed at [facility], [address], [place], [place] [address]    DG Chest 2 View  Result Date: 07/27/2023 CLINICAL DATA:  Shortness of breath EXAM: CHEST - 2 VIEW COMPARISON:  Chest radiograph dated 05/31/2023. FINDINGS: The heart is enlarged. Vascular calcifications are seen in the aortic arch. There is a small left pleural effusion with associated atelectasis, decreased compared to prior exam. There is no right pleural effusion and the right lung is clear. There is no pneumothorax on either side. Degenerative changes are seen in the spine. IMPRESSION: Small left pleural effusion with associated atelectasis, decreased compared to prior exam. Electronically Signed   By: Romona Curls M.D.   On: 07/21/2023 14:11    Pending Labs Unresulted Labs (From admission, onward)     Start     Ordered   07/20/2023 0500  Comprehensive metabolic panel  Tomorrow morning,   R        07/19/2023 1535   07/21/2023  0500  CBC  Tomorrow morning,   R        07/26/2023 1535   07/25/2023 1530  Magnesium  Add-on,   AD        07/18/2023 1535            Vitals/Pain Today's Vitals   07/29/2023 1400 07/17/2023 1429 07/16/2023 1430 07/28/2023 1445  BP: (!) 213/58  (!) 216/60 (!) 216/58 (!) 181/63  Pulse: 92 94 93 92  Resp: 18 17 17 20   Temp:  99.1 F (37.3 C)  99.9 F (37.7 C)  TempSrc:  Oral  Oral  SpO2: 95%  94%   Weight:      Height:      PainSc:        Isolation Precautions No active isolations  Medications Medications  pantoprazole (PROTONIX) 80 mg /NS 100 mL IVPB (has no administration in time range)  pantoprozole (PROTONIX) 80 mg /NS 100 mL infusion (has no administration in time range)  pantoprazole (PROTONIX) injection 40 mg (has no administration in time range)  prochlorperazine (COMPAZINE) injection 10 mg (has no administration in time range)  diphenhydrAMINE (BENADRYL) injection 25 mg (has no administration in time range)  ezetimibe (ZETIA) tablet 10 mg (has no administration in time range)  rosuvastatin (CRESTOR) tablet 40 mg (has no administration in time range)  spironolactone (ALDACTONE) tablet 25 mg (has no administration in time range)  levothyroxine (SYNTHROID) tablet 100 mcg (has no administration in time range)  Budeson-Glycopyrrol-Formoterol 160-9-4.8 MCG/ACT AERO 2 puff (has no administration in time range)  sodium chloride flush (NS) 0.9 % injection 3 mL (has no administration in time range)  acetaminophen (TYLENOL) tablet 650 mg (has no administration in time range)    Or  acetaminophen (TYLENOL) suppository 650 mg (has no administration in time range)  potassium chloride SA (KLOR-CON M) CR tablet 40 mEq (has no administration in time range)  furosemide (LASIX) tablet 40 mg (has no administration in time range)  cloNIDine (CATAPRES) tablet 0.2 mg (has no administration in time range)  0.9 %  sodium chloride infusion (Manually program via Guardrails IV Fluids) ( Intravenous New Bag/Given 08/05/2023 1436)    Mobility walks     Focused Assessments GI. Low Hemoglobin   R Recommendations: See Admitting Provider Note  Report given to:   Additional Notes:

## 2023-07-10 NOTE — Progress Notes (Addendum)
eLink Physician-Brief Progress Note Patient Name: Gwendolyn [name] DOB: November 27, 1955 MRN: [id number]   Date of Service  07/09/2023  HPI/Events of Note  67 year old female presented with shortness of breath, nausea, abdominal discomfort and found to have new anemia.  She was admitted with an acute upper GI bleed, received PRBC transfusion and suddenly developed hypertensive emergency with respiratory failure in the setting of taco.  Patient is hypertensive into the 220s, tachypneic, and requiring BiPAP to maintain saturation 100% on 40% FiO2.  Laboratory studies consistent with mild hyperglycemia, electrolyte disturbances, none anion gap metabolic acidosis.  Mild troponin elevation.  Radiograph consistent with fluid overload  eICU Interventions  Status post hydralazine and labetalol.  Lasix x 2.  Holding home p.o. meds, maintaining BiPAP therapy for the time being.  On clonidine and may be having reflux of hypertension.  Maintain Cleviprex.  Trend hemoglobin, cautiously transfuse to maintain hemoglobin greater than 7.  Chemoprophylaxis contraindicated in the setting of likely bleed.  Maintain SCDs Therapeutic PPI in the setting of likely bleed.   2340 -patient feeling anxious, normally takes alprazolam at home.  Currently on BiPAP and unable to take p.o.'s.  Add on lorazepam IV   Intervention Category Evaluation Type: New Patient Evaluation  [name] 07/18/2023, 11:14 PM

## 2023-07-10 NOTE — ED Notes (Signed)
IP provider at bedside.

## 2023-07-10 NOTE — H&P (Addendum)
History and Physical   Gwendolyn [name] BMW:[id number] DOB: 06-11-1956 DOA: 07/09/2023  PCP: Ralene Ok, MD   Patient coming from: Home  Chief Complaint: Weakness, abdominal discomfort, shortness of breath  HPI: Gwendolyn [name] is a 67 y.o. female with medical history significant of hypertension, hyperlipidemia, anemia, hypothyroidism, PAD, COPD, GERD, CHF, aortic valve stenosis, GI bleed presenting with weakness, shortness of breath, abdominal discomfort.  Patient reports that recently her hemoglobin was found to have dropped from 10-9 and she was restarted on iron supplements.  She has had some resulting constipation from this.  3 days ago she had a large bowel movement that was dark in color but she was unsure if this was due to her being on iron.  She has been feeling generally worse since that time.  Reports some nausea without vomiting as well as some generalized weakness and mild shortness of breath.  Noted some significant elevated blood pressures at home and she was unable to take her meds this morning due to abdominal discomfort.  She denies fevers, chills, chest pain.  ED Course: Vital signs in the ED notable for blood pressure in the 180s to 210s systolic.  Lab workup included CMP with sodium 131, potassium 3.2, chloride 95, glucose 118, calcium 8.7, protein 6.1.  CBC with hemoglobin of 5.8 down from 10 a month ago.  Lactic acid normal with repeat pending.  Troponin mildly elevated at 95 with repeat pending.  BNP elevated to 1800.  Lipase negative.  FOBT positive.  Chest x-ray with small left pleural effusion with atelectasis but improved from previous.  Patient started on IV PPI followed by PPI infusion in the ED.  Also received Compazine and Benadryl.  2 units ordered for transfusion.  GI consulted and are seeing the patient.  Review of Systems: As per HPI otherwise all other systems reviewed and are negative.  Past Medical History:  Diagnosis Date   Acute exacerbation of  chronic obstructive pulmonary disease (COPD) (HCC) 05/28/2023   Acute on chronic diastolic CHF (congestive heart failure) (HCC) 10/18/2022   Anemia    Anxiety    Asthma    COPD (chronic obstructive pulmonary disease) (HCC)    Depression    GERD (gastroesophageal reflux disease)    History of kidney stones    Hypertension    Hypothyroidism     Past Surgical History:  Procedure Laterality Date   ABDOMINAL AORTOGRAM W/LOWER EXTREMITY N/A 02/03/2022   Procedure: ABDOMINAL AORTOGRAM W/LOWER EXTREMITY;  Surgeon: Yates Decamp, MD;  Location: [facility];  Service: Cardiovascular;  Laterality: N/A;   ABDOMINAL HYSTERECTOMY     BIOPSY  10/21/2022   Procedure: BIOPSY;  Surgeon: Jeani Hawking, MD;  Location: [facility];  Service: Gastroenterology;;   Cesarean Section     (2)   CHOLECYSTECTOMY     COLONOSCOPY  2020   COLONOSCOPY WITH PROPOFOL N/A 10/21/2022   Procedure: COLONOSCOPY WITH PROPOFOL;  Surgeon: Jeani Hawking, MD;  Location: [facility];  Service: Gastroenterology;  Laterality: N/A;   COLONOSCOPY WITH PROPOFOL N/A 06/02/2023   Procedure: COLONOSCOPY WITH PROPOFOL;  Surgeon: Jeani Hawking, MD;  Location: [facility] ENDOSCOPY;  Service: Gastroenterology;  Laterality: N/A;   ESOPHAGOGASTRODUODENOSCOPY (EGD) WITH PROPOFOL N/A 10/21/2022   Procedure: ESOPHAGOGASTRODUODENOSCOPY (EGD) WITH PROPOFOL;  Surgeon: Jeani Hawking, MD;  Location: [facility];  Service: Gastroenterology;  Laterality: N/A;   Gastric Ulcer  2020   GIVENS CAPSULE STUDY N/A 05/31/2023   Procedure: GIVENS CAPSULE STUDY;  Surgeon: Jeani Hawking, MD;  Location:  [facility];  Service: Gastroenterology;  Laterality: N/A;   PERIPHERAL VASCULAR BALLOON ANGIOPLASTY  02/03/2022   Procedure: PERIPHERAL VASCULAR BALLOON ANGIOPLASTY;  Surgeon: Yates Decamp, MD;  Location: [facility];  Service: Cardiovascular;;   POLYPECTOMY  10/21/2022   Procedure: POLYPECTOMY;  Surgeon: Jeani Hawking, MD;  Location: [facility];  Service:  Gastroenterology;;   RENAL ANGIOGRAPHY N/A 02/03/2022   Procedure: RENAL ANGIOGRAPHY;  Surgeon: Yates Decamp, MD;  Location: [facility] INVASIVE CV LAB;  Service: Cardiovascular;  Laterality: N/A;   UMBILICAL HERNIA REPAIR N/A 10/29/2020   Procedure: OPEN UMBILICAL HERNIA REPAIR WITH MESH;  Surgeon: Stechschulte, Hyman Hopes, MD;  Location: [facility] OR;  Service: General;  Laterality: N/A;    Social History  reports that she has been smoking cigarettes. She has a 40 pack-year smoking history. She has never used smokeless tobacco. She reports that she does not drink alcohol and does not use drugs.  Allergies  Allergen Reactions   Doxycycline Anaphylaxis and Rash   Azilsartan Cough   Biaxin [Clarithromycin] Diarrhea and Other (See Comments)    Vertigo   Floxacillin [Floxacillin (Flucloxacillin)] Other (See Comments)    Vertigo   Morphine And Codeine Other (See Comments)    Headache    Family History  Problem Relation Age of Onset   Colon cancer Mother    Diabetes Father    Diabetes Sister    Breast cancer Sister 37   Hypertension Sister   Reviewed on admission  Prior to Admission medications   Medication Sig Start Date End Date Taking? Authorizing Provider  acetaminophen (TYLENOL) 500 MG tablet Take 500 mg by mouth 2 (two) times daily as needed for headache or moderate pain.    [provider]  ALPRAZolam Prudy Feeler) 0.5 MG tablet Take 1 tablet (0.5 mg total) by mouth 3 (three) times daily as needed for anxiety. 06/04/23   Arrien, York Ram, MD  amLODipine (NORVASC) 5 MG tablet Take 2 tablets (10 mg total) by mouth daily. Patient taking differently: Take 10 mg by mouth 2 (two) times daily. 10/21/22   Shalhoub, Deno Lunger, MD  Budeson-Glycopyrrol-Formoterol (BREZTRI AEROSPHERE) 160-9-4.8 MCG/ACT AERO Inhale 2 puffs into the lungs daily as needed (SOB).    [provider]  cloNIDine (CATAPRES) 0.2 MG tablet Take 1 tablet (0.2 mg total) by mouth 2 (two) times daily. Patient taking  differently: Take 0.2 mg by mouth at bedtime. 06/04/23 07/04/23  Arrien, York Ram, MD  cyanocobalamin (VITAMIN B12) 1000 MCG tablet Take 1 tablet (1,000 mcg total) by mouth daily. 10/21/22   Shalhoub, Deno Lunger, MD  dapagliflozin propanediol (FARXIGA) 10 MG TABS tablet Take 1 tablet (10 mg total) by mouth daily. 06/05/23   Arrien, York Ram, MD  ezetimibe (ZETIA) 10 MG tablet Take 1 tablet (10 mg total) by mouth daily. 06/21/23 07/21/23  Yates Decamp, MD  ferrous sulfate 325 (65 FE) MG tablet Take 1 tablet (325 mg total) by mouth daily before breakfast. 10/21/22 10/21/23  Shalhoub, Deno Lunger, MD  furosemide (LASIX) 40 MG tablet Take 1 tablet (40 mg total) by mouth daily. 06/05/23   Arrien, York Ram, MD  hydrALAZINE (APRESOLINE) 100 MG tablet Take 1 tablet (100 mg total) by mouth 3 (three) times daily. 06/04/23 07/04/23  Arrien, York Ram, MD  HYDROcodone-acetaminophen (NORCO/VICODIN) 5-325 MG tablet Take 0.5 tablets by mouth 2 (two) times daily as needed for moderate pain.    [provider]  isosorbide dinitrate (ISORDIL) 30 MG tablet Take 1 tablet (30 mg total) by mouth  3 (three) times daily. Hold if BP <140/80 06/21/23   Yates Decamp, MD  labetalol (NORMODYNE) 300 MG tablet Take 1 tablet (300 mg total) by mouth 3 (three) times daily. 06/21/23   Yates Decamp, MD  levothyroxine (SYNTHROID) 100 MCG tablet Take 100 mcg by mouth daily before breakfast.    [provider]  pantoprazole (PROTONIX) 40 MG tablet Take 1 tablet by mouth 2 times daily before a meal for 30 days, THEN take1 tablet daily. Patient taking differently: Take 1 tablet by mouth daily. 10/21/22 06/21/23  Shalhoub, Deno Lunger, MD  rosuvastatin (CRESTOR) 40 MG tablet Take 40 mg by mouth daily. 09/14/22   [provider]  sodium chloride (AYR) 0.65 % nasal spray Place 1 spray into the nose daily as needed for congestion.    [provider]  spironolactone (ALDACTONE) 25 MG tablet Take 1 tablet (25 mg  total) by mouth daily. 06/21/23 07/21/23  Yates Decamp, MD    Physical Exam: Vitals:   07/13/2023 1400 07/28/2023 1429 07/22/2023 1430 08/04/2023 1445  BP: (!) 213/58 (!) 216/60 (!) 216/58 (!) 181/63  Pulse: 92 94 93 92  Resp: 18 17 17 20   Temp:  99.1 F (37.3 C)  99.9 F (37.7 C)  TempSrc:  Oral  Oral  SpO2: 95%  94%   Weight:      Height:        Physical Exam Constitutional:      General: She is not in acute distress.    Appearance: Normal appearance.  HENT:     Head: Normocephalic and atraumatic.     Mouth/Throat:     Mouth: Mucous membranes are moist.     Pharynx: Oropharynx is clear.  Eyes:     Extraocular Movements: Extraocular movements intact.     Pupils: Pupils are equal, round, and reactive to light.  Cardiovascular:     Rate and Rhythm: Normal rate and regular rhythm.     Pulses: Normal pulses.     Heart sounds: Normal heart sounds.  Pulmonary:     Effort: Pulmonary effort is normal. No respiratory distress.     Breath sounds: Normal breath sounds.  Abdominal:     General: Bowel sounds are normal. There is no distension.     Palpations: Abdomen is soft.     Tenderness: There is no abdominal tenderness.  Musculoskeletal:        General: No swelling or deformity.  Skin:    General: Skin is warm and dry.     Coloration: Skin is pale.  Neurological:     General: No focal deficit present.     Mental Status: Mental status is at baseline.    Labs on Admission: I have personally reviewed following labs and imaging studies  CBC: Recent Labs  Lab 08/06/2023 1256  WBC 10.5  HGB 5.8*  HCT 18.9*  MCV 88.7  PLT 246    Basic Metabolic Panel: Recent Labs  Lab 07/15/2023 1256  NA 131*  K 3.2*  CL 95*  CO2 23  GLUCOSE 118*  BUN 17  CREATININE 0.97  CALCIUM 8.7*    GFR: Estimated Creatinine Clearance: 47.4 mL/min (by C-G formula based on SCr of 0.97 mg/dL).  Liver Function Tests: Recent Labs  Lab 07/31/2023 1256  AST 24  ALT 21  ALKPHOS 40  BILITOT 0.8   PROT 6.1*  ALBUMIN 3.9    Urine analysis:    Component Value Date/Time   COLORURINE YELLOW 02/07/2022 0048   APPEARANCEUR CLEAR  02/07/2022 0048   LABSPEC 1.012 02/07/2022 0048   PHURINE 5.0 02/07/2022 0048   GLUCOSEU NEGATIVE 02/07/2022 0048   HGBUR NEGATIVE 02/07/2022 0048   BILIRUBINUR NEGATIVE 02/07/2022 0048   KETONESUR NEGATIVE 02/07/2022 0048   PROTEINUR NEGATIVE 02/07/2022 0048   NITRITE NEGATIVE 02/07/2022 0048   LEUKOCYTESUR NEGATIVE 02/07/2022 0048    Radiological Exams on Admission: DG Chest 2 View  Result Date: 07/30/2023 CLINICAL DATA:  Shortness of breath EXAM: CHEST - 2 VIEW COMPARISON:  Chest radiograph dated 05/31/2023. FINDINGS: The heart is enlarged. Vascular calcifications are seen in the aortic arch. There is a small left pleural effusion with associated atelectasis, decreased compared to prior exam. There is no right pleural effusion and the right lung is clear. There is no pneumothorax on either side. Degenerative changes are seen in the spine. IMPRESSION: Small left pleural effusion with associated atelectasis, decreased compared to prior exam. Electronically Signed   By: Romona Curls M.D.   On: 07/09/2023 14:11    EKG: Independently reviewed.  Sinus rhythm at 97 bpm.  Left bundle blanch block with repolarization abnormality.  Did have EKG on 8/14 with left anterior fascicular block and QRS greater than 100 at that time.  Assessment/Plan Principal Problem:   GI bleed Active Problems:   Systolic CHF (HCC)   COPD (chronic obstructive pulmonary disease) (HCC)   Essential hypertension   Hypothyroidism   Hyperlipidemia   Normocytic anemia   Peripheral artery disease (HCC)   Aortic valve stenosis   Chronic heart failure with preserved ejection fraction (HFpEF) (HCC)   GI bleed Symptomatic anemia > Presenting with weakness and shortness of breath as well as some abdominal discomfort.  Had large dark bowel movement several days ago following constipation  after starting iron supplement. > FOBT positive.  Hemoglobin now 6.8 down from 10 a month ago. > 2 units ordered for transfusion in the ED.  GI consulted. > Does have history of prior GI bleed. - Appreciate GI recommendations and assistance - Continue with transfusion of 2 units -Continue with IV PPI - Trend hemoglobin - Clear liquid diet - Supportive care  Hypertension > Hypertensive in the 180s to 210s in the ED in the setting of poorly controlled hypertension and not taking his medications this morning. > EDP has held off on resuming home medications while confirming trajectory of GI bleeding of blood pressure. - Given CHF history will resume home diuretics first: Spironolactone and Lasix - Will also resume clonidine to avoid rebound hypertension - Will continue to hold amlodipine, hydralazine, labetalol, Isordil for now ADDENDUM - Not tolerating p.o. meds well.  Given one-time IV dose of Lasix as below - Labetalol IV as needed for systolic blood pressure greater than 180 ADDENDUM 2 > Worsening hypertension after initial unit was transfused as patient had not yet received IV Lasix dose in ED.  Remained hypertensive despite IV labetalol x 1. - Instructed RN to hold second unit until at least 30 minutes after IV Lasix has been given as patient reports some shortness of breath as well. - Also have ordered additional 5 mg as needed IV labile and increased as needed dose to 10 mg.   Chronic combined CHF > Last echo 2 months ago with EF 50 hide 55%, G2 DD, normal RV function. > BNP is elevated to 813 however her BNP has been fairly chronically elevated for the last year and chest x-ray shows small left pleural effusion improved from previous. > Troponin mildly elevated at 95 suspecting some  degree of strain given heart failure and symptomatic anemia, will continue to trend. - For now we will give one-time dose of 40 mg IV Lasix given blood transfusion - Resume home Lasix tomorrow -  Continue home spironolactone - Supplement potassium as below - Check magnesium - I's and O's, daily weights  Hyperlipidemia - Continue home rosuvastatin and Zetia  Hypothyroidism - Continue home Synthroid  GERD - Will be on IV PPI as above  COPD - Continue home Breztri  DVT prophylaxis: SCDs Code Status:   Full Family Communication:  None on admission  Disposition Plan:   Patient is from:  Home  Anticipated DC to:  Home  Anticipated DC date:  1 to 3 days  Anticipated DC barriers: None  Consults called:  Gastroenterology Admission status:  Observation, progressive  Severity of Illness: The appropriate patient status for this patient is OBSERVATION. Observation status is judged to be reasonable and necessary in order to provide the required intensity of service to ensure the patient's safety. The patient's presenting symptoms, physical exam findings, and initial radiographic and laboratory data in the context of their medical condition is felt to place them at decreased risk for further clinical deterioration. Furthermore, it is anticipated that the patient will be medically stable for discharge from the hospital within 2 midnights of admission.    Synetta Fail MD [facility]  How to contact the [facility] Attending or Consulting provider 7A - 7P or covering provider during after hours 7P -7A, for this patient?   Check the care team in [facility] and look for a) attending/consulting [facility] provider listed and b) the [facility] team listed Log into [email or web address] and use 's universal password to access. If you do not have the password, please contact the hospital operator. Locate the [facility] provider you are looking for under [facility] and page to a number that you can be directly reached. If you still have difficulty reaching the provider, please page the [facility] (Director on Call) for the Hospitalists listed on amion for assistance.  08/04/2023, 3:35 PM

## 2023-07-10 NOTE — Plan of Care (Signed)

## 2023-07-11 ENCOUNTER — Encounter (HOSPITAL_COMMUNITY): Payer: Self-pay | Admitting: Internal Medicine

## 2023-07-11 ENCOUNTER — Observation Stay (HOSPITAL_COMMUNITY): Payer: Medicare Other

## 2023-07-11 ENCOUNTER — Inpatient Hospital Stay (HOSPITAL_COMMUNITY): Payer: Medicare Other

## 2023-07-11 ENCOUNTER — Encounter (HOSPITAL_COMMUNITY): Admission: EM | Disposition: E | Payer: Self-pay | Source: Home / Self Care | Attending: Pulmonary Disease

## 2023-07-11 DIAGNOSIS — K7200 Acute and subacute hepatic failure without coma: Secondary | ICD-10-CM | POA: Diagnosis present

## 2023-07-11 DIAGNOSIS — R739 Hyperglycemia, unspecified: Secondary | ICD-10-CM | POA: Diagnosis present

## 2023-07-11 DIAGNOSIS — Z515 Encounter for palliative care: Secondary | ICD-10-CM | POA: Diagnosis not present

## 2023-07-11 DIAGNOSIS — I447 Left bundle-branch block, unspecified: Secondary | ICD-10-CM | POA: Diagnosis present

## 2023-07-11 DIAGNOSIS — J80 Acute respiratory distress syndrome: Secondary | ICD-10-CM | POA: Diagnosis present

## 2023-07-11 DIAGNOSIS — E876 Hypokalemia: Secondary | ICD-10-CM | POA: Diagnosis present

## 2023-07-11 DIAGNOSIS — E8771 Transfusion associated circulatory overload: Secondary | ICD-10-CM | POA: Diagnosis present

## 2023-07-11 DIAGNOSIS — D62 Acute posthemorrhagic anemia: Secondary | ICD-10-CM | POA: Diagnosis present

## 2023-07-11 DIAGNOSIS — Y848 Other medical procedures as the cause of abnormal reaction of the patient, or of later complication, without mention of misadventure at the time of the procedure: Secondary | ICD-10-CM | POA: Diagnosis present

## 2023-07-11 DIAGNOSIS — E872 Acidosis, unspecified: Secondary | ICD-10-CM | POA: Diagnosis present

## 2023-07-11 DIAGNOSIS — K55049 Acute infarction of large intestine, extent unspecified: Secondary | ICD-10-CM | POA: Diagnosis present

## 2023-07-11 DIAGNOSIS — R531 Weakness: Secondary | ICD-10-CM | POA: Diagnosis present

## 2023-07-11 DIAGNOSIS — I060 Rheumatic aortic stenosis: Secondary | ICD-10-CM | POA: Diagnosis present

## 2023-07-11 DIAGNOSIS — E039 Hypothyroidism, unspecified: Secondary | ICD-10-CM | POA: Diagnosis present

## 2023-07-11 DIAGNOSIS — D509 Iron deficiency anemia, unspecified: Secondary | ICD-10-CM | POA: Diagnosis present

## 2023-07-11 DIAGNOSIS — J9601 Acute respiratory failure with hypoxia: Secondary | ICD-10-CM | POA: Diagnosis not present

## 2023-07-11 DIAGNOSIS — I161 Hypertensive emergency: Secondary | ICD-10-CM | POA: Diagnosis present

## 2023-07-11 DIAGNOSIS — N170 Acute kidney failure with tubular necrosis: Secondary | ICD-10-CM | POA: Diagnosis present

## 2023-07-11 DIAGNOSIS — N184 Chronic kidney disease, stage 4 (severe): Secondary | ICD-10-CM | POA: Diagnosis present

## 2023-07-11 DIAGNOSIS — I739 Peripheral vascular disease, unspecified: Secondary | ICD-10-CM | POA: Diagnosis present

## 2023-07-11 DIAGNOSIS — I5032 Chronic diastolic (congestive) heart failure: Secondary | ICD-10-CM | POA: Diagnosis present

## 2023-07-11 DIAGNOSIS — E162 Hypoglycemia, unspecified: Secondary | ICD-10-CM | POA: Diagnosis present

## 2023-07-11 DIAGNOSIS — E875 Hyperkalemia: Secondary | ICD-10-CM | POA: Diagnosis not present

## 2023-07-11 DIAGNOSIS — J4489 Other specified chronic obstructive pulmonary disease: Secondary | ICD-10-CM | POA: Diagnosis present

## 2023-07-11 DIAGNOSIS — Z66 Do not resuscitate: Secondary | ICD-10-CM | POA: Diagnosis not present

## 2023-07-11 DIAGNOSIS — I130 Hypertensive heart and chronic kidney disease with heart failure and stage 1 through stage 4 chronic kidney disease, or unspecified chronic kidney disease: Secondary | ICD-10-CM | POA: Diagnosis present

## 2023-07-11 DIAGNOSIS — E785 Hyperlipidemia, unspecified: Secondary | ICD-10-CM | POA: Diagnosis present

## 2023-07-11 HISTORY — PX: ESOPHAGOGASTRODUODENOSCOPY: SHX5428

## 2023-07-11 LAB — RENAL FUNCTION PANEL
Albumin: 2.3 g/dL — ABNORMAL LOW (ref 3.5–5.0)
Anion gap: 28 — ABNORMAL HIGH (ref 5–15)
BUN: 29 mg/dL — ABNORMAL HIGH (ref 8–23)
CO2: 12 mmol/L — ABNORMAL LOW (ref 22–32)
Calcium: 7.5 mg/dL — ABNORMAL LOW (ref 8.9–10.3)
Chloride: 93 mmol/L — ABNORMAL LOW (ref 98–111)
Creatinine, Ser: 2.84 mg/dL — ABNORMAL HIGH (ref 0.44–1.00)
GFR, Estimated: 18 mL/min — ABNORMAL LOW (ref >60)
Glucose, Bld: 74 mg/dL (ref 70–99)
Phosphorus: >30 mg/dL — ABNORMAL HIGH (ref 2.5–4.6)
Potassium: 7 mmol/L (ref 3.5–5.1)
Sodium: 133 mmol/L — ABNORMAL LOW (ref 135–145)

## 2023-07-11 LAB — TYPE AND SCREEN
ABO/RH(D): O POS
Antibody Screen: NEGATIVE
Unit division: 0
Unit division: 0

## 2023-07-11 LAB — TECHNOLOGIST SMEAR REVIEW
Clinical Information: ELEVATED
Plt Morphology: NORMAL

## 2023-07-11 LAB — POCT I-STAT 7, (LYTES, BLD GAS, ICA,H+H)
Acid-base deficit: 17 mmol/L — ABNORMAL HIGH (ref 0.0–2.0)
Acid-base deficit: 10 mmol/L — ABNORMAL HIGH (ref 0.0–2.0)
Bicarbonate: 8.7 mmol/L — ABNORMAL LOW (ref 20.0–28.0)
Bicarbonate: 16.8 mmol/L — ABNORMAL LOW (ref 20.0–28.0)
Calcium, Ion: 1.04 mmol/L — ABNORMAL LOW (ref 1.15–1.40)
Calcium, Ion: 0.85 mmol/L — CL (ref 1.15–1.40)
HCT: 22 % — ABNORMAL LOW (ref 36.0–46.0)
HCT: 19 % — ABNORMAL LOW (ref 36.0–46.0)
Hemoglobin: 7.5 g/dL — ABNORMAL LOW (ref 12.0–15.0)
Hemoglobin: 6.5 g/dL — CL (ref 12.0–15.0)
O2 Saturation: 98 %
O2 Saturation: 100 %
Potassium: 5.6 mmol/L — ABNORMAL HIGH (ref 3.5–5.1)
Potassium: 6.5 mmol/L (ref 3.5–5.1)
Sodium: 131 mmol/L — ABNORMAL LOW (ref 135–145)
Sodium: 132 mmol/L — ABNORMAL LOW (ref 135–145)
TCO2: 9 mmol/L — ABNORMAL LOW (ref 22–32)
TCO2: 18 mmol/L — ABNORMAL LOW (ref 22–32)
pCO2 arterial: 19.2 mmHg — CL (ref 32–48)
pCO2 arterial: 37.8 mmHg (ref 32–48)
pH, Arterial: 7.263 — ABNORMAL LOW (ref 7.35–7.45)
pH, Arterial: 7.255 — ABNORMAL LOW (ref 7.35–7.45)
pO2, Arterial: 123 mmHg — ABNORMAL HIGH (ref 83–108)
pO2, Arterial: 363 mmHg — ABNORMAL HIGH (ref 83–108)

## 2023-07-11 LAB — CBC WITH DIFFERENTIAL/PLATELET
Abs Immature Granulocytes: 0.79 10*3/uL — ABNORMAL HIGH (ref 0.00–0.07)
Basophils Absolute: 0.1 10*3/uL (ref 0.0–0.1)
Basophils Relative: 0 %
Eosinophils Absolute: 0 10*3/uL (ref 0.0–0.5)
Eosinophils Relative: 0 %
HCT: 25.1 % — ABNORMAL LOW (ref 36.0–46.0)
Hemoglobin: 7.6 g/dL — ABNORMAL LOW (ref 12.0–15.0)
Immature Granulocytes: 2 %
Lymphocytes Relative: 2 %
Lymphs Abs: 0.8 10*3/uL (ref 0.7–4.0)
MCH: 29.3 pg (ref 26.0–34.0)
MCHC: 30.3 g/dL (ref 30.0–36.0)
MCV: 96.9 fL (ref 80.0–100.0)
Monocytes Absolute: 0.8 10*3/uL (ref 0.1–1.0)
Monocytes Relative: 3 %
Neutro Abs: 31.3 10*3/uL — ABNORMAL HIGH (ref 1.7–7.7)
Neutrophils Relative %: 93 %
Platelets: 116 10*3/uL — ABNORMAL LOW (ref 150–400)
RBC: 2.59 MIL/uL — ABNORMAL LOW (ref 3.87–5.11)
RDW: 15.2 % (ref 11.5–15.5)
WBC: 33.3 10*3/uL — ABNORMAL HIGH (ref 4.0–10.5)
nRBC: 1.1 % — ABNORMAL HIGH (ref 0.0–0.2)

## 2023-07-11 LAB — BPAM RBC
Blood Product Expiration Date: 202409262359
Blood Product Expiration Date: 202409262359
ISSUE DATE / TIME: 202409021423
ISSUE DATE / TIME: 202409021817
Unit Type and Rh: 5100
Unit Type and Rh: 5100

## 2023-07-11 LAB — BASIC METABOLIC PANEL
Anion gap: 27 — ABNORMAL HIGH (ref 5–15)
Anion gap: 29 — ABNORMAL HIGH (ref 5–15)
BUN: 26 mg/dL — ABNORMAL HIGH (ref 8–23)
BUN: 26 mg/dL — ABNORMAL HIGH (ref 8–23)
CO2: 8 mmol/L — ABNORMAL LOW (ref 22–32)
CO2: 8 mmol/L — ABNORMAL LOW (ref 22–32)
Calcium: 8.7 mg/dL — ABNORMAL LOW (ref 8.9–10.3)
Calcium: 8.2 mg/dL — ABNORMAL LOW (ref 8.9–10.3)
Chloride: 98 mmol/L (ref 98–111)
Chloride: 94 mmol/L — ABNORMAL LOW (ref 98–111)
Creatinine, Ser: 2.47 mg/dL — ABNORMAL HIGH (ref 0.44–1.00)
Creatinine, Ser: 2.69 mg/dL — ABNORMAL HIGH (ref 0.44–1.00)
GFR, Estimated: 21 mL/min — ABNORMAL LOW (ref >60)
GFR, Estimated: 19 mL/min — ABNORMAL LOW (ref >60)
Glucose, Bld: 95 mg/dL (ref 70–99)
Glucose, Bld: 100 mg/dL — ABNORMAL HIGH (ref 70–99)
Potassium: 5.8 mmol/L — ABNORMAL HIGH (ref 3.5–5.1)
Potassium: 6.6 mmol/L (ref 3.5–5.1)
Sodium: 133 mmol/L — ABNORMAL LOW (ref 135–145)
Sodium: 131 mmol/L — ABNORMAL LOW (ref 135–145)

## 2023-07-11 LAB — CBC
HCT: 29.1 % — ABNORMAL LOW (ref 36.0–46.0)
HCT: 24.1 % — ABNORMAL LOW (ref 36.0–46.0)
Hemoglobin: 8.9 g/dL — ABNORMAL LOW (ref 12.0–15.0)
Hemoglobin: 7.3 g/dL — ABNORMAL LOW (ref 12.0–15.0)
MCH: 28.4 pg (ref 26.0–34.0)
MCH: 28.7 pg (ref 26.0–34.0)
MCHC: 30.6 g/dL (ref 30.0–36.0)
MCHC: 30.3 g/dL (ref 30.0–36.0)
MCV: 93 fL (ref 80.0–100.0)
MCV: 94.9 fL (ref 80.0–100.0)
Platelets: 157 10*3/uL (ref 150–400)
Platelets: 119 10*3/uL — ABNORMAL LOW (ref 150–400)
RBC: 3.13 MIL/uL — ABNORMAL LOW (ref 3.87–5.11)
RBC: 2.54 MIL/uL — ABNORMAL LOW (ref 3.87–5.11)
RDW: 15.2 % (ref 11.5–15.5)
RDW: 15.2 % (ref 11.5–15.5)
WBC: 35.5 10*3/uL — ABNORMAL HIGH (ref 4.0–10.5)
WBC: 32.9 10*3/uL — ABNORMAL HIGH (ref 4.0–10.5)
nRBC: 0.7 % — ABNORMAL HIGH (ref 0.0–0.2)
nRBC: 1 % — ABNORMAL HIGH (ref 0.0–0.2)

## 2023-07-11 LAB — COMPREHENSIVE METABOLIC PANEL
ALT: 972 U/L — ABNORMAL HIGH (ref 0–44)
AST: 910 U/L — ABNORMAL HIGH (ref 15–41)
Albumin: 3 g/dL — ABNORMAL LOW (ref 3.5–5.0)
Alkaline Phosphatase: 59 U/L (ref 38–126)
Anion gap: 22 — ABNORMAL HIGH (ref 5–15)
BUN: 25 mg/dL — ABNORMAL HIGH (ref 8–23)
CO2: 8 mmol/L — ABNORMAL LOW (ref 22–32)
Calcium: 8 mg/dL — ABNORMAL LOW (ref 8.9–10.3)
Chloride: 96 mmol/L — ABNORMAL LOW (ref 98–111)
Creatinine, Ser: 2.37 mg/dL — ABNORMAL HIGH (ref 0.44–1.00)
GFR, Estimated: 22 mL/min — ABNORMAL LOW (ref >60)
Glucose, Bld: 384 mg/dL — ABNORMAL HIGH (ref 70–99)
Potassium: 7.1 mmol/L (ref 3.5–5.1)
Sodium: 126 mmol/L — ABNORMAL LOW (ref 135–145)
Total Bilirubin: 3.1 mg/dL — ABNORMAL HIGH (ref 0.3–1.2)
Total Protein: 5 g/dL — ABNORMAL LOW (ref 6.5–8.1)

## 2023-07-11 LAB — GLUCOSE, CAPILLARY
Glucose-Capillary: 414 mg/dL — ABNORMAL HIGH (ref 70–99)
Glucose-Capillary: 522 mg/dL (ref 70–99)
Glucose-Capillary: <10 mg/dL — CL (ref 70–99)
Glucose-Capillary: 260 mg/dL — ABNORMAL HIGH (ref 70–99)
Glucose-Capillary: 169 mg/dL — ABNORMAL HIGH (ref 70–99)
Glucose-Capillary: 101 mg/dL — ABNORMAL HIGH (ref 70–99)
Glucose-Capillary: 95 mg/dL (ref 70–99)

## 2023-07-11 LAB — VOLATILES,BLD-ACETONE,ETHANOL,ISOPROP,METHANOL
Acetone, blood: <0.01 g/dL (ref 0.000–0.010)
Ethanol, blood: <0.01 g/dL (ref 0.000–0.010)
Isopropanol, blood: <0.01 g/dL (ref 0.000–0.010)
Methanol, blood: <0.01 g/dL (ref 0.000–0.010)

## 2023-07-11 LAB — BETA-HYDROXYBUTYRIC ACID: Beta-Hydroxybutyric Acid: 0.3 mmol/L — ABNORMAL HIGH (ref 0.05–0.27)

## 2023-07-11 LAB — LACTIC ACID, PLASMA
Lactic Acid, Venous: >9 mmol/L (ref 0.5–1.9)
Lactic Acid, Venous: >9 mmol/L (ref 0.5–1.9)

## 2023-07-11 LAB — HEMOGLOBIN AND HEMATOCRIT, BLOOD
HCT: 28.1 % — ABNORMAL LOW (ref 36.0–46.0)
Hemoglobin: 9.1 g/dL — ABNORMAL LOW (ref 12.0–15.0)

## 2023-07-11 LAB — SALICYLATE LEVEL: Salicylate Lvl: 7.9 mg/dL (ref 7.0–30.0)

## 2023-07-11 SURGERY — EGD (ESOPHAGOGASTRODUODENOSCOPY)
Anesthesia: Moderate Sedation

## 2023-07-11 MED ORDER — KETAMINE HCL 50 MG/5ML IJ SOSY: 0.5000 mg/kg | PREFILLED_SYRINGE | Freq: Once | INTRAMUSCULAR | Status: AC

## 2023-07-11 MED ORDER — MIDAZOLAM HCL 2 MG/2ML IJ SOLN: 2.0000 mg | Freq: Once | INTRAMUSCULAR | Status: AC

## 2023-07-11 MED ORDER — MIDAZOLAM HCL 2 MG/2ML IJ SOLN
INTRAMUSCULAR | Status: AC
  Administered 2023-07-11: 2 mg
  Filled 2023-07-11: qty 2

## 2023-07-11 MED ORDER — DEXTROSE 50 % IV SOLN: 1.0000 | Freq: Once | INTRAVENOUS | Status: AC

## 2023-07-11 MED ORDER — HEPARIN (PORCINE) 2000 UNITS/L FOR CRRT: INTRAVENOUS_CENTRAL | Status: DC | PRN

## 2023-07-11 MED ORDER — CALCIUM GLUCONATE 10 % IV SOLN
1.0000 g | Freq: Once | INTRAVENOUS | Status: AC
  Administered 2023-07-11: 1 g via INTRAVENOUS
  Filled 2023-07-11: qty 10

## 2023-07-11 MED ORDER — INSULIN ASPART 100 UNIT/ML IV SOLN
10.0000 [IU] | Freq: Once | INTRAVENOUS | Status: AC
  Administered 2023-07-11: 10 [IU] via INTRAVENOUS

## 2023-07-11 MED ORDER — SODIUM CHLORIDE 0.9 % IV SOLN: INTRAVENOUS | Status: DC | PRN

## 2023-07-11 MED ORDER — IOHEXOL 350 MG/ML SOLN
100.0000 mL | Freq: Once | INTRAVENOUS | Status: AC | PRN
  Administered 2023-07-11: 100 mL via INTRAVENOUS

## 2023-07-11 MED ORDER — EPINEPHRINE 1 MG/10ML IJ SOSY
PREFILLED_SYRINGE | INTRAMUSCULAR | Status: AC
  Filled 2023-07-11: qty 10

## 2023-07-11 MED ORDER — SODIUM BICARBONATE 8.4 % IV SOLN
Freq: Once | INTRAVENOUS | Status: AC
  Filled 2023-07-11: qty 150

## 2023-07-11 MED ORDER — PRISMASOL BGK 4/2.5 32-4-2.5 MEQ/L EC SOLN
Status: DC
  Filled 2023-07-11 (×6): qty 5000

## 2023-07-11 MED ORDER — DEXTROSE 50 % IV SOLN
INTRAVENOUS | Status: AC
  Administered 2023-07-11: 50 mL via INTRAVENOUS
  Filled 2023-07-11: qty 50

## 2023-07-11 MED ORDER — MIDAZOLAM HCL (PF) 5 MG/ML IJ SOLN
INTRAMUSCULAR | Status: AC
  Filled 2023-07-11: qty 2

## 2023-07-11 MED ORDER — ROCURONIUM BROMIDE 50 MG/5ML IV SOLN
100.0000 mg | Freq: Once | INTRAVENOUS | Status: AC
  Filled 2023-07-11: qty 10

## 2023-07-11 MED ORDER — SODIUM CHLORIDE 0.9 % IV SOLN
1.0000 g | INTRAVENOUS | Status: DC
  Administered 2023-07-11: 1 g via INTRAVENOUS
  Filled 2023-07-11: qty 10

## 2023-07-11 MED ORDER — MIDAZOLAM-SODIUM CHLORIDE 100-0.9 MG/100ML-% IV SOLN: 0.0000 mg/h | INTRAVENOUS | Status: DC

## 2023-07-11 MED ORDER — METRONIDAZOLE 500 MG/100ML IV SOLN
500.0000 mg | Freq: Two times a day (BID) | INTRAVENOUS | Status: DC
  Administered 2023-07-11: 500 mg via INTRAVENOUS
  Filled 2023-07-11: qty 100

## 2023-07-11 MED ORDER — SODIUM BICARBONATE 8.4 % IV SOLN
50.0000 meq | Freq: Once | INTRAVENOUS | Status: AC
  Administered 2023-07-11: 50 meq via INTRAVENOUS
  Filled 2023-07-11: qty 50

## 2023-07-11 MED ORDER — PRISMASOL BGK 4/2.5 32-4-2.5 MEQ/L REPLACEMENT SOLN
Status: DC
  Filled 2023-07-11 (×2): qty 5000

## 2023-07-11 MED ORDER — SODIUM BICARBONATE 8.4 % IV SOLN
100.0000 meq | Freq: Once | INTRAVENOUS | Status: AC
  Administered 2023-07-11: 100 meq via INTRAVENOUS
  Filled 2023-07-11: qty 50

## 2023-07-11 MED ORDER — POLYETHYLENE GLYCOL 3350 17 G PO PACK: 17.0000 g | PACK | Freq: Every day | ORAL | Status: DC

## 2023-07-11 MED ORDER — INSULIN ASPART 100 UNIT/ML IV SOLN: 10.0000 [IU] | Freq: Once | INTRAVENOUS | Status: DC

## 2023-07-11 MED ORDER — ALBUTEROL SULFATE (2.5 MG/3ML) 0.083% IN NEBU
10.0000 mg | INHALATION_SOLUTION | Freq: Once | RESPIRATORY_TRACT | Status: AC
  Administered 2023-07-11: 10 mg via RESPIRATORY_TRACT
  Filled 2023-07-11: qty 12

## 2023-07-11 MED ORDER — FENTANYL BOLUS VIA INFUSION: 25.0000 ug | INTRAVENOUS | Status: DC | PRN

## 2023-07-11 MED ORDER — DOCUSATE SODIUM 50 MG/5ML PO LIQD: 100.0000 mg | Freq: Two times a day (BID) | ORAL | Status: DC

## 2023-07-11 MED ORDER — FENTANYL CITRATE (PF) 100 MCG/2ML IJ SOLN
INTRAMUSCULAR | Status: AC
  Filled 2023-07-11: qty 4

## 2023-07-11 MED ORDER — SODIUM CHLORIDE 0.9 % IV SOLN
350.0000 [IU]/h | INTRAVENOUS | Status: DC
  Filled 2023-07-11: qty 2

## 2023-07-11 MED ORDER — ARTIFICIAL TEARS OPHTHALMIC OINT
TOPICAL_OINTMENT | Freq: Three times a day (TID) | OPHTHALMIC | Status: DC
  Filled 2023-07-11: qty 3.5

## 2023-07-11 MED ORDER — DEXTROSE 10 % IV SOLN: INTRAVENOUS | Status: DC

## 2023-07-11 MED ORDER — KETAMINE HCL 50 MG/5ML IJ SOSY
PREFILLED_SYRINGE | INTRAMUSCULAR | Status: AC
  Administered 2023-07-11: 50 mg
  Filled 2023-07-11: qty 10

## 2023-07-11 MED ORDER — FENTANYL CITRATE PF 50 MCG/ML IJ SOSY: 50.0000 ug | PREFILLED_SYRINGE | Freq: Once | INTRAMUSCULAR | Status: AC

## 2023-07-11 MED ORDER — SODIUM CHLORIDE 0.9 % IV SOLN: INTRAVENOUS | Status: DC

## 2023-07-11 MED ORDER — FUROSEMIDE 10 MG/ML IJ SOLN
80.0000 mg | Freq: Once | INTRAMUSCULAR | Status: AC
  Administered 2023-07-11: 80 mg via INTRAVENOUS
  Filled 2023-07-11: qty 8

## 2023-07-11 MED ORDER — DEXTROSE 50 % IV SOLN
1.0000 | Freq: Once | INTRAVENOUS | Status: AC
  Administered 2023-07-11: 50 mL via INTRAVENOUS
  Filled 2023-07-11: qty 50

## 2023-07-11 MED ORDER — PANTOPRAZOLE SODIUM 40 MG IV SOLR: 40.0000 mg | Freq: Two times a day (BID) | INTRAVENOUS | Status: DC

## 2023-07-11 MED ORDER — SODIUM CHLORIDE 0.9 % IV BOLUS
500.0000 mL | Freq: Once | INTRAVENOUS | Status: AC
  Administered 2023-07-11: 500 mL via INTRAVENOUS

## 2023-07-11 MED ORDER — FENTANYL CITRATE PF 50 MCG/ML IJ SOSY
PREFILLED_SYRINGE | INTRAMUSCULAR | Status: AC
  Administered 2023-07-11: 50 ug
  Filled 2023-07-11: qty 2

## 2023-07-11 MED ORDER — NOREPINEPHRINE 4 MG/250ML-% IV SOLN
0.0000 ug/min | INTRAVENOUS | Status: DC
  Administered 2023-07-11: 2 ug/min via INTRAVENOUS
  Filled 2023-07-11: qty 250

## 2023-07-11 MED ORDER — SODIUM BICARBONATE 8.4 % IV SOLN
INTRAVENOUS | Status: AC
  Filled 2023-07-11: qty 50

## 2023-07-11 MED ORDER — SODIUM BICARBONATE 8.4 % IV SOLN
INTRAVENOUS | Status: AC
  Administered 2023-07-11: 50 meq
  Filled 2023-07-11: qty 100

## 2023-07-11 MED ORDER — FENTANYL 2500MCG IN NS 250ML (10MCG/ML) PREMIX INFUSION
25.0000 ug/h | INTRAVENOUS | Status: DC
  Administered 2023-07-11: 25 ug/h via INTRAVENOUS
  Filled 2023-07-11: qty 250

## 2023-07-11 MED ORDER — MIDAZOLAM BOLUS VIA INFUSION: 0.0000 mg | INTRAVENOUS | Status: DC | PRN

## 2023-07-11 MED ORDER — FENTANYL CITRATE PF 50 MCG/ML IJ SOSY: 25.0000 ug | PREFILLED_SYRINGE | Freq: Once | INTRAMUSCULAR | Status: DC

## 2023-07-11 MED ORDER — ROCURONIUM BROMIDE 10 MG/ML (PF) SYRINGE
PREFILLED_SYRINGE | INTRAVENOUS | Status: AC
  Administered 2023-07-11: 70 mg
  Filled 2023-07-11: qty 10

## 2023-07-11 MED ORDER — STERILE WATER FOR INJECTION IV SOLN
INTRAVENOUS | Status: DC
  Filled 2023-07-11: qty 150

## 2023-07-11 MED ORDER — HEPARIN SODIUM (PORCINE) 1000 UNIT/ML DIALYSIS: 1000.0000 [IU] | INTRAMUSCULAR | Status: DC | PRN

## 2023-07-11 NOTE — Progress Notes (Signed)
NAME:  Gwendolyn [name], MRN:  [id number], DOB:  11-13-1955, LOS: 0 ADMISSION DATE:  07/22/2023, CONSULTATION DATE:  07/15/2023 REFERRING MD:  Lyda Perone, DO CHIEF COMPLAINT:  Respiratory distress   History of Present Illness:  C3-year-old female who presented with weakness, SOB and nausea found with hemoglobin of 5.8, previously 10.  Admitted to ICU overnight after patient went into respiratory distress after 1 unit of PRBC.  CXR with concern for flash pulmonary edema in setting of circulatory overload and hypertensive emergency.  She was started on Cleviprex gtt and put on BiPAP.  This a.m. patient tolerating BiPAP well, blood pressure normotensive to soft.  Noted to have hyperglycemia, markedly rising LFTs, and hyperkalemia-she was started on hyperkalemia protocol with bicarb.  Additionally she has been having trouble urinating as well as rising creatinine. Patient is mentating well.  Pertinent  Medical History  hypertension, hyperlipidemia, anemia, hypothyroidism, PAD, COPD, GERD, CHF, aortic valve stenosis, GI bleed   Significant Hospital Events: Including procedures, antibiotic start and stop dates in addition to other pertinent events   Foley placed 9/3  Interim History / Subjective:  Early AM noted to have worsening acidosis and HyperK, was started on HyperK protocol and bicarb.    Objective   Blood pressure 125/88, pulse (!) 51, temperature (!) 96.3 F (35.7 C), temperature source Axillary, resp. rate (!) 35, height 5\' 1"  (1.549 m), weight 63.1 kg, SpO2 100%.    FiO2 (%):  [30 %-100 %] 30 % PEEP:  [6 cmH20] 6 cmH20 Pressure Support:  [8 cmH20] 8 cmH20   Intake/Output Summary (Last 24 hours) at 07/16/2023 0728 Last data filed at 07/28/2023 0700 Gross per 24 hour  Intake 1642.99 ml  Output --  Net 1642.99 ml   Filed Weights   07/29/2023 1248 08/03/2023 0500  Weight: 61.7 kg 63.1 kg    Examination: General: NAD, chronically ill-appearing HENT: BiPAP in place. Normocephalic  and atraumatic head. Lungs: CTAB, normal WOB on BiPAP Cardiovascular: Bradycardic. No murmurs. Abdomen: Soft, not distended. Diffuse tenderness to palpation. Extremities: No edema. Moving all 4 extremities Neuro: Easily arousable, nods to questions appropriately  Resolved Hospital Problem list    Assessment & Plan:  Acute hypoxemic respiratory failure 2/2 flash pulmonary edema in setting of transfusion associated circulatory overload and hypertensive emergency  CXR c/w with pulmonary edema, small left pleural effusion.  S/p 160 mg IV Lasix, with only 300 mL UOP.  Patient is likely sensitive to volume shifts 2/2 diastolic heart failure.  Hypertension improved, no longer needing IV blood pressure medication-and actually has bradycardia and intermittent maps less than 65 which may be contributing to low UOP.  Anticipate patient will have improved respiratory status once she is appropriately diuresed. - Avoid labetalol D/T bradycardia - N.p.o. - Place Foley to monitor UOP - Peripheral Levophed, maintain MAP >65 - Wean BiPAP as tolerated  Acute blood loss anemia GI bleed Hgb stable 7.3. S/p 1 U pRBC.  Discontinued PPI drip d/t volume overload concern. -PPI twice daily dosing -GI consulted  Hyperkalemia Initially presented with HypoK, received IV K+ supplementation. K+ 7.1 > 5.6. Started on hyperkalemia protocol with bicarb infusion. -Hold spironolactone -S/p calcium gluconate -q4h BMP  Metabolic acidosis pCO2 19.2, Bicarb 8.7. Currently receiving 1L of sodium bicarb. Anticipate improvement once patient is diuresed. Will watch glucose closely. -q4h BMP -Check beta-hydroxybutyrate  Hyperglycemia Initially presenting with CBGs >500, and received 10 units of short acting insulin.  Had episode of hypoglycemia (unclear if true result as it was  less than 10), started on D10, and now currently CBGs within goal. -Continue to monitor  Leukocytosis Marked elevation of white count.  Unclear if  stress response or infectious etiology.  Did have infiltrates on CXR. - CBC with differential and smear - Start CAP coverage  AKI Cr 1.49 -> 2.37.  Likely 2/2 hypoperfusion.  HLD - Continue home statins when able   Hypothyroidism - Continue home synthroid when able   GERD - PPI   COPD - On home Breztri - Duonebs scheduled for now  [employer] (right click and "Reselect all SmartList Selections" daily)   Diet/type: NPO DVT prophylaxis: Active bleed GI prophylaxis: PPI Lines: N/A Foley:  Yes, and it is still needed Code Status:  full code Last date of multidisciplinary goals of care discussion [Pending]  Labs   CBC: Recent Labs  Lab 07/13/2023 1256 07/15/2023 2333 07/10/2023 0221 07/23/2023 0444 07/23/2023 0714  WBC 10.5  --  35.5* 32.9*  --   HGB 5.8* 9.1* 8.9* 7.3* 7.5*  HCT 18.9* 28.1* 29.1* 24.1* 22.0*  MCV 88.7  --  93.0 94.9  --   PLT 246  --  157 119*  --     Basic Metabolic Panel: Recent Labs  Lab 07/28/2023 1256 07/16/2023 2135 07/25/2023 0444 07/16/2023 0714  NA 131* 127* 126* 131*  K 3.2* 5.2* 7.1* 5.6*  CL 95* 97* 96*  --   CO2 23 17* 8*  --   GLUCOSE 118* 284* 384*  --   BUN 17 23 25*  --   CREATININE 0.97 1.49* 2.37*  --   CALCIUM 8.7* 8.4* 8.0*  --   MG 1.8  --   --   --    GFR: Estimated Creatinine Clearance: 19.6 mL/min (A) (by C-G formula based on SCr of 2.37 mg/dL (H)). Recent Labs  Lab 07/22/2023 1256 07/09/2023 1310 07/26/2023 0221 08/01/2023 0444  WBC 10.5  --  35.5* 32.9*  LATICACIDVEN  --  1.0  --   --     Liver Function Tests: Recent Labs  Lab 07/15/2023 1256 08/05/2023 0444  AST 24 910*  ALT 21 972*  ALKPHOS 40 59  BILITOT 0.8 3.1*  PROT 6.1* 5.0*  ALBUMIN 3.9 3.0*   Recent Labs  Lab 07/21/2023 1256  LIPASE 36   No results for input(s): "AMMONIA" in the last 168 hours.  ABG    Component Value Date/Time   PHART 7.263 (L) 08/03/2023 0714   PCO2ART 19.2 (LL) 07/29/2023 0714   PO2ART 123 (H) 07/20/2023 0714   HCO3 8.7 (L)  07/10/2023 0714   TCO2 9 (L) 08/02/2023 0714   ACIDBASEDEF 17.0 (H) 07/29/2023 0714   O2SAT 98 07/15/2023 0714     Coagulation Profile: No results for input(s): "INR", "PROTIME" in the last 168 hours.  Cardiac Enzymes: No results for input(s): "CKTOTAL", "CKMB", "CKMBINDEX", "TROPONINI" in the last 168 hours.  HbA1C: No results found for: "HGBA1C"  CBG: Recent Labs  Lab 08/05/2023 2221 08/03/2023 2341 07/25/2023 0400 07/27/2023 0416 07/17/2023 0429  GLUCAP 225* 140* <10* 522* 414*   Past Medical History:  She,  has a past medical history of Acute exacerbation of chronic obstructive pulmonary disease (COPD) (HCC) (05/28/2023), Acute on chronic diastolic CHF (congestive heart failure) (HCC) (10/18/2022), Anemia, Anxiety, Asthma, COPD (chronic obstructive pulmonary disease) (HCC), Depression, GERD (gastroesophageal reflux disease), History of kidney stones, Hypertension, and Hypothyroidism.   Surgical History:   Past Surgical History:  Procedure Laterality Date   ABDOMINAL AORTOGRAM W/LOWER EXTREMITY N/A  02/03/2022   Procedure: ABDOMINAL AORTOGRAM W/LOWER EXTREMITY;  Surgeon: Yates Decamp, MD;  Location: [facility] INVASIVE CV LAB;  Service: Cardiovascular;  Laterality: N/A;   ABDOMINAL HYSTERECTOMY     BIOPSY  10/21/2022   Procedure: BIOPSY;  Surgeon: Jeani Hawking, MD;  Location: [facility] ENDOSCOPY;  Service: Gastroenterology;;   Cesarean Section     (2)   CHOLECYSTECTOMY     COLONOSCOPY  2020   COLONOSCOPY WITH PROPOFOL N/A 10/21/2022   Procedure: COLONOSCOPY WITH PROPOFOL;  Surgeon: Jeani Hawking, MD;  Location: [facility] ENDOSCOPY;  Service: Gastroenterology;  Laterality: N/A;   COLONOSCOPY WITH PROPOFOL N/A 06/02/2023   Procedure: COLONOSCOPY WITH PROPOFOL;  Surgeon: Jeani Hawking, MD;  Location: [facility] ENDOSCOPY;  Service: Gastroenterology;  Laterality: N/A;   ESOPHAGOGASTRODUODENOSCOPY (EGD) WITH PROPOFOL N/A 10/21/2022   Procedure: ESOPHAGOGASTRODUODENOSCOPY (EGD) WITH PROPOFOL;  Surgeon: Jeani Hawking,  MD;  Location: [facility] ENDOSCOPY;  Service: Gastroenterology;  Laterality: N/A;   Gastric Ulcer  2020   GIVENS CAPSULE STUDY N/A 05/31/2023   Procedure: GIVENS CAPSULE STUDY;  Surgeon: Jeani Hawking, MD;  Location: [facility] ENDOSCOPY;  Service: Gastroenterology;  Laterality: N/A;   PERIPHERAL VASCULAR BALLOON ANGIOPLASTY  02/03/2022   Procedure: PERIPHERAL VASCULAR BALLOON ANGIOPLASTY;  Surgeon: Yates Decamp, MD;  Location: [facility] INVASIVE CV LAB;  Service: Cardiovascular;;   POLYPECTOMY  10/21/2022   Procedure: POLYPECTOMY;  Surgeon: Jeani Hawking, MD;  Location: [facility] ENDOSCOPY;  Service: Gastroenterology;;   RENAL ANGIOGRAPHY N/A 02/03/2022   Procedure: RENAL ANGIOGRAPHY;  Surgeon: Yates Decamp, MD;  Location: [facility] INVASIVE CV LAB;  Service: Cardiovascular;  Laterality: N/A;   UMBILICAL HERNIA REPAIR N/A 10/29/2020   Procedure: OPEN UMBILICAL HERNIA REPAIR WITH MESH;  Surgeon: Stechschulte, Hyman Hopes, MD;  Location: [facility] OR;  Service: General;  Laterality: N/A;     Social History:   reports that she has been smoking cigarettes. She has a 40 pack-year smoking history. She has never used smokeless tobacco. She reports that she does not drink alcohol and does not use drugs.   Family History:  Her family history includes Breast cancer (age of onset: 36) in her sister; Colon cancer in her mother; Diabetes in her father and sister; Hypertension in her sister.   Allergies Allergies  Allergen Reactions   Doxycycline Anaphylaxis and Rash   Azilsartan Cough   Biaxin [Clarithromycin] Diarrhea and Other (See Comments)    Vertigo   Floxacillin [Floxacillin (Flucloxacillin)] Other (See Comments)    Vertigo   Labetalol Hcl Other (See Comments)    Cannot handle a 300 mg dose at once- must be a lesser strength    Morphine And Codeine Other (See Comments)    Headache     Home Medications  Prior to Admission medications   Medication Sig Start Date End Date Taking? Authorizing Provider  acetaminophen (TYLENOL) 500 MG tablet  Take 500 mg by mouth 2 (two) times daily as needed for mild pain, moderate pain or headache.   Yes [provider]  ALPRAZolam (XANAX) 0.5 MG tablet Take 1 tablet (0.5 mg total) by mouth 3 (three) times daily as needed for anxiety. Patient taking differently: Take 0.25-0.5 mg by mouth 3 (three) times daily as needed for anxiety. 06/04/23  Yes Arrien, York Ram, MD  amLODipine (NORVASC) 5 MG tablet Take 2 tablets (10 mg total) by mouth daily. 10/21/22  Yes Shalhoub, Deno Lunger, MD  cloNIDine (CATAPRES) 0.2 MG tablet Take 1 tablet (0.2 mg total) by mouth 2 (two) times daily. Patient taking differently: Take 0.2 mg by mouth  at bedtime. 06/04/23 08/05/2023 Yes Arrien, York Ram, MD  cyanocobalamin (VITAMIN B12) 1000 MCG tablet Take 1 tablet (1,000 mcg total) by mouth daily. Patient taking differently: Take 1,000 mcg by mouth at bedtime. 10/21/22  Yes Shalhoub, Deno Lunger, MD  ezetimibe (ZETIA) 10 MG tablet Take 1 tablet (10 mg total) by mouth daily. 06/21/23 07/21/23 Yes Yates Decamp, MD  ferrous sulfate 325 (65 FE) MG tablet Take 1 tablet (325 mg total) by mouth daily before breakfast. 10/21/22 10/21/23 Yes Shalhoub, Deno Lunger, MD  furosemide (LASIX) 20 MG tablet Take 20 mg by mouth in the morning.   Yes [provider]  hydrALAZINE (APRESOLINE) 50 MG tablet Take 50 mg by mouth 3 (three) times daily.   Yes [provider]  HYDROcodone-acetaminophen (NORCO/VICODIN) 5-325 MG tablet Take 0.25-1 tablets by mouth 2 (two) times daily as needed for moderate pain.   Yes [provider]  isosorbide dinitrate (ISORDIL) 30 MG tablet Take 1 tablet (30 mg total) by mouth 3 (three) times daily. Hold if BP <140/80 Patient taking differently: Take 30 mg by mouth daily with lunch. Hold if BP <140/80 06/21/23  Yes Yates Decamp, MD  labetalol (NORMODYNE) 100 MG tablet Take 100 mg by mouth 3 (three) times daily.   Yes [provider]  levothyroxine (SYNTHROID) 100 MCG tablet Take 100  mcg by mouth daily before breakfast.   Yes [provider]  pantoprazole (PROTONIX) 40 MG tablet Take 1 tablet by mouth 2 times daily before a meal for 30 days, THEN take1 tablet daily. Patient taking differently: Take 40 mg by mouth in the morning 30 minutes before breakfast 10/21/22 08/05/2023 Yes Shalhoub, Deno Lunger, MD  rosuvastatin (CRESTOR) 40 MG tablet Take 40 mg by mouth daily. 09/14/22  Yes [provider]  sodium chloride (AYR) 0.65 % nasal spray Place 1 spray into the nose as needed for congestion (EACH NOSTRIL).   Yes [provider]  spironolactone (ALDACTONE) 25 MG tablet Take 1 tablet (25 mg total) by mouth daily. 06/21/23 07/21/23 Yes Yates Decamp, MD  dapagliflozin propanediol (FARXIGA) 10 MG TABS tablet Take 1 tablet (10 mg total) by mouth daily. Patient not taking: Reported on 07/09/2023 06/05/23   Arrien, York Ram, MD  furosemide (LASIX) 40 MG tablet Take 1 tablet (40 mg total) by mouth daily. Patient not taking: Reported on 07/15/2023 06/05/23   Arrien, York Ram, MD  hydrALAZINE (APRESOLINE) 100 MG tablet Take 1 tablet (100 mg total) by mouth 3 (three) times daily. Patient not taking: Reported on 07/30/2023 06/04/23 07/13/2023  Arrien, York Ram, MD  labetalol (NORMODYNE) 300 MG tablet Take 1 tablet (300 mg total) by mouth 3 (three) times daily. Patient not taking: Reported on 08/01/2023 06/21/23   Yates Decamp, MD     Critical care time: 35 min

## 2023-07-11 NOTE — Procedures (Signed)
Intubation Procedure Note  Gwendolyn [name]  [id number]  1956-01-02  Date:07/31/2023  Time:1:27 PM   Provider Performing:Sevin Farone Claudean Severance   Procedure: Intubation (31500)  Indication(s) Respiratory Failure  Consent Risks of the procedure as well as the alternatives and risks of each were explained to the patient and/or caregiver. Verbal consent was obtained by Son.  Anesthesia Versed, Fentanyl, Rocuronium, and Ketamine   Time Out Verified patient identification, verified procedure, site/side was marked, verified correct patient position, special equipment/implants available, medications/allergies/relevant history reviewed, required imaging and test results available.   Sterile Technique Usual hand hygeine, masks, and gloves were used   Procedure Description Patient positioned in bed supine.  Sedation given as noted above.  Patient was intubated with endotracheal tube using Glidescope.  View was Grade 1 full glottis .  Number of attempts was 1.  Colorimetric CO2 detector was consistent with tracheal placement.   Complications/Tolerance None; patient tolerated the procedure well. Chest X-ray is ordered to verify placement.   EBL Minimal   Specimen(s) None

## 2023-07-11 NOTE — TOC CM/SW Note (Signed)
Transition of Care [facility]) - Inpatient Brief Assessment   Patient Details  Name: GINI ELLINGSON MRN: [id number] Date of Birth: August 04, 1956  Transition of Care [facility]) CM/SW Contact:    Tom-Johnson, Hershal Coria, RN Phone Number: 07/23/2023, 1:55 PM   Clinical Narrative:  Patient presented to the ED with Generalized Weakness, SOB and Abdominal Discomfort. Found to have Symptomatic Anemia, admitted with GI Bleed. GI following.  Intubated today for Respiratory Failure and Sedated.   No TOC needs or recommendations noted at this time.  Patient not Medically ready for discharge.  CM will continue to follow as patient progresses with care towards discharge.     Transition of Care Asessment: Insurance and Status: Insurance coverage has been reviewed Patient has primary care physician: Yes Home environment has been reviewed: Yes Prior level of function:: Independent Prior/Current Home Services: No current home services Social Determinants of Health Reivew: SDOH reviewed no interventions necessary Readmission risk has been reviewed: Yes Transition of care needs: no transition of care needs at this time

## 2023-07-11 NOTE — Progress Notes (Signed)
Critical Care Attending Supplemental Progress Note:  Repeat lactate came back at >9. Original value of 1.0 may have been spurious as other chemistry parameters are unchanged.  The overall clinical picture now really fits with ischemic bowel: abdominal pain, bleeding, marked leukocytosis and probable ARDS.  Discussed with Dr Renaldo Reel and Dr Allena Katz as well as the patient's son. Will proceed with intubation to allow for safe imaging of the abdomen. High likelihood that she will eventually require HD particularly if she receives contrast. May very well find something that is not fixable. Consensus is that it is reasonable to proceed with diagnostic testing and try to get a firm diagnosis.   Lynnell Catalan, MD [facility] ICU Physician [facility] [place] [facility]  Pager: [phone] Or Epic Secure Chat After hours: [phone].  07/24/2023, 2:23 PM

## 2023-07-11 NOTE — Op Note (Signed)
[facility] Patient Name: Gwendolyn [name] Procedure Date : 07/18/2023 MRN: [id number] Attending MD: Jeani Hawking , MD, [id number] Date of Birth: Sep 15, 1956 CSN: [id number] Age: 67 Admit Type: Inpatient Procedure:                Upper GI endoscopy Indications:              Hematemesis, Melena Providers:                Jeani Hawking, MD, Salley Scarlet, Technician,                            [facility], RN, Referring MD:              Medicines:                None Complications:            No immediate complications. Estimated Blood Loss:     Estimated blood loss: none. Procedure:                Pre-Anesthesia Assessment:                           - Prior to the procedure, a History and Physical                            was performed, and patient medications and                            allergies were reviewed. The patient's tolerance of                            previous anesthesia was also reviewed. The risks                            and benefits of the procedure and the sedation                            options and risks were discussed with the patient.                            All questions were answered, and informed consent                            was obtained. Prior Anticoagulants: The patient has                            taken no anticoagulant or antiplatelet agents. ASA                            Grade Assessment: IV - A patient with severe                            systemic disease that is a constant threat to life.  After reviewing the risks and benefits, the patient                            was deemed in satisfactory condition to undergo the                            procedure.                           - No sedation medications were administered.                           After obtaining informed consent, the endoscope was                            passed under direct vision. Throughout the                             procedure, the patient's blood pressure, pulse, and                            oxygen saturations were monitored continuously. The                            [id number]-[id number] ([id number]) Olympus endoscope was introduced                            through the mouth, and advanced to the second part                            of duodenum. The upper GI endoscopy was                            accomplished without difficulty. The patient                            tolerated the procedure well. Scope In: Scope Out: Findings:      The esophagus was normal.      Red blood was found in the gastric antrum and at the pylorus.      Diffuse hemorrhagic mucosa with active bleeding and with stigmata of       bleeding was found in the second portion of the duodenum.      More blood was noted in the antrum, but there was no gastric source of       bleeding. NG trauma was identified in the body of the stomach. The       duodenal bulb appeared normal, but in the D2 portion and distally there       was evidence of ischemia. The mucosa was dusky and there were a few       islands of normal mucosa. The CTA confirms the suspicions of ischemia. Impression:               - Normal esophagus.                           -  Red blood in the gastric antrum and in the                            pylorus.                           - Hemorrhagic duodenopathy.                           - No specimens collected. Recommendation:           - Await Surgery consultation. Procedure Code(s):        --- Professional ---                           [phone], Esophagogastroduodenoscopy, flexible,                            transoral; diagnostic, including collection of                            specimen(s) by brushing or washing, when performed                            (separate procedure) Diagnosis Code(s):        --- Professional ---                           K92.2, Gastrointestinal hemorrhage, unspecified                            K92.0, Hematemesis                           K92.1, Melena (includes Hematochezia) CPT copyright 2022 American Medical Association. All rights reserved. The codes documented in this report are preliminary and upon coder review may  be revised to meet current compliance requirements. Jeani Hawking, MD Jeani Hawking, MD 07/10/2023 5:29:01 PM This report has been signed electronically. Number of Addenda: 0

## 2023-07-11 NOTE — Progress Notes (Signed)
Initial Nutrition Assessment  DOCUMENTATION CODES:   Not applicable  INTERVENTION:   - RD will monitor for ability to initiate nutrition support; if imaging reveals ischemic bowel, suspect pt will require initiation of TPN if within GOC  NUTRITION DIAGNOSIS:   Inadequate oral intake related to inability to eat as evidenced by NPO status.  GOAL:   Patient will meet greater than or equal to 90% of their needs  MONITOR:   Vent status, Labs, Weight trends, I & O's  REASON FOR ASSESSMENT:   Ventilator, Consult Assessment of nutrition requirement/status (new CRRT)  ASSESSMENT:   67 year old female who presented to the ED on 9/02 with abdominal bloating, decreased PO intake, nausea. PMH of COPD, HFpEF, HTN, HLD, anemia, hypothyroidism, PAD, GERD, aortic valve stenosis. Pt admitted with GI bleed, symptomatic anemia, acute decompensated diastolic heart failure, AKI.  0/86 - intubated, OG tube placed  Discussed pt with RN and during ICU rounds. Repeat lactate came back at >9. Per PCCM, overall clinical picture of abdominal pain, bleeding, marked leukocytosis, and probable ARDS fits with ischemic bowel. Noted CRRT orders in place.  Unable to obtain diet or weight history at this time. Reviewed weight history in chart. Pt with a 3.5 kg weight loss since 06/04/23. This is a 5.3% weight loss in ~1 month which is severe and significant for timeframe. Pt is at risk for malnutrition and may already meet criteria, but RD unable to confirm without NFPE.  Pt in CT at time of RD visit. CT abdomen/pelvis underway. RD will monitor for imaging results and plans for nutrition support. If imaging reveals ischemic bowel, suspect pt will require initiation of TPN if within GOC.   Patient is currently intubated on ventilator support Temp (24hrs), Avg:97.7 F (36.5 C), Min:96.3 F (35.7 C), Max:98.8 F (37.1 C)  Drips: Levophed: 5 mcg/min D10: 15 ml/hr  Medications reviewed and include: IV  protonix, IV abx  Labs reviewed: sodium 132, potassium 6.5, chloride 94, BUN 26, creatinine 2.69, ionized calcium 0.85, elevated LFTs, lactic acid >9.0, WBC 33.3, hemoglobin 6.5, platelets 116 CBG's: <10-522 x 24 hours  I/O's: +2.4 L since admit  NUTRITION - FOCUSED PHYSICAL EXAM:  Unable to complete. Pt in CT.  Diet Order:   Diet Order     None       EDUCATION NEEDS:   Not appropriate for education at this time  Skin:  Skin Assessment: Reviewed RN Assessment  Last BM:  no documented BM  Height:   Ht Readings from Last 1 Encounters:  07/30/2023 5\' 1"  (1.549 m)    Weight:   Wt Readings from Last 1 Encounters:  07/29/2023 63.1 kg    Ideal Body Weight:  47.7 kg  BMI:  Body mass index is 26.28 kg/m.  Estimated Nutritional Needs:   Kcal:  1700-1900  Protein:  100-120 grams  Fluid:  1.7-1.9 L    Mertie Clause, MS, RD, LDN Registered Dietitian II Please see AMiON for contact information.

## 2023-07-11 NOTE — Progress Notes (Signed)
Consulted nephrology. Spoke with Dr. Allena Katz. He has agreed to see patient. We greatly appreciate Nephrology's assistance with this patient's care.

## 2023-07-11 NOTE — Progress Notes (Signed)
Patient asystole on monitor. No heart sounds or palpable pulse present. Verifies by second RN, Tory Emerald Time of death 07-24-44. Family at bed side. Support given.

## 2023-07-11 NOTE — Progress Notes (Signed)
PT was transported to CT scan and back without complications.

## 2023-07-11 NOTE — Progress Notes (Signed)
Notified Radiology Nuc Med to cancel test for patient today, per MD.  Patient not stable to lay flat for test.  Maybe tomorrow.

## 2023-07-11 NOTE — Consult Note (Signed)
Reason for Consult: Acute kidney injury, hyperkalemia Referring Physician: Lynnell Catalan MD (CCM)  HPI: (Patient unable to contribute to history because of lethargy/BiPAP) 67 year old woman with past medical history significant for hypertension, dyslipidemia, hypothyroidism, peripheral vascular disease, COPD, atherosclerotic aorta with multilevel disease including right renal artery stenosis, aortic valve stenosis, diastolic heart failure and normal renal function at baseline (creatinine 0.8-1.0) who presented to the hospital yesterday with progressively worsening weakness, abdominal discomfort, nausea and shortness of breath.  Her hemoglobin was found to be 5.8 with history of dark stools (while on oral iron that was started after noted to have downtrending hemoglobin as an outpatient).  She had acute respiratory distress after 1 unit of PRBC transfusion prompting initiation of NIPPV and transferred to the ICU.  She was noted to have severely elevated blood pressure with systolics >200 treated with antihypertensive therapy with relative hypotension noted thereafter.  Outpatient medications reviewed and noted to be significant for spironolactone that was ordered but not given yesterday.  Concern is raised with continued hypoxic respiratory failure and now development of acute kidney injury with creatinine going up to 2.5 from 1.5 yesterday and minimal response to diuresis (300 cc overnight).  She also has had worsening hyperkalemia and metabolic acidosis.  She was started on sodium bicarbonate and repeat labs are pending.  Past Medical History:  Diagnosis Date   Acute exacerbation of chronic obstructive pulmonary disease (COPD) (HCC) 05/28/2023   Acute on chronic diastolic CHF (congestive heart failure) (HCC) 10/18/2022   Anemia    Anxiety    Asthma    COPD (chronic obstructive pulmonary disease) (HCC)    Depression    GERD (gastroesophageal reflux disease)    History of kidney stones     Hypertension    Hypothyroidism     Past Surgical History:  Procedure Laterality Date   ABDOMINAL AORTOGRAM W/LOWER EXTREMITY N/A 02/03/2022   Procedure: ABDOMINAL AORTOGRAM W/LOWER EXTREMITY;  Surgeon: Yates Decamp, MD;  Location: [facility];  Service: Cardiovascular;  Laterality: N/A;   ABDOMINAL HYSTERECTOMY     BIOPSY  10/21/2022   Procedure: BIOPSY;  Surgeon: Jeani Hawking, MD;  Location: [facility];  Service: Gastroenterology;;   Cesarean Section     (2)   CHOLECYSTECTOMY     COLONOSCOPY  2020   COLONOSCOPY WITH PROPOFOL N/A 10/21/2022   Procedure: COLONOSCOPY WITH PROPOFOL;  Surgeon: Jeani Hawking, MD;  Location: [facility];  Service: Gastroenterology;  Laterality: N/A;   COLONOSCOPY WITH PROPOFOL N/A 06/02/2023   Procedure: COLONOSCOPY WITH PROPOFOL;  Surgeon: Jeani Hawking, MD;  Location: [facility] ENDOSCOPY;  Service: Gastroenterology;  Laterality: N/A;   ESOPHAGOGASTRODUODENOSCOPY (EGD) WITH PROPOFOL N/A 10/21/2022   Procedure: ESOPHAGOGASTRODUODENOSCOPY (EGD) WITH PROPOFOL;  Surgeon: Jeani Hawking, MD;  Location: [facility];  Service: Gastroenterology;  Laterality: N/A;   Gastric Ulcer  2020   GIVENS CAPSULE STUDY N/A 05/31/2023   Procedure: GIVENS CAPSULE STUDY;  Surgeon: Jeani Hawking, MD;  [facility];  Service: Gastroenterology;  Laterality: N/A;   PERIPHERAL VASCULAR BALLOON ANGIOPLASTY  02/03/2022   Procedure: PERIPHERAL VASCULAR BALLOON ANGIOPLASTY;  Surgeon: Yates Decamp, MD;  Location: [facility];  Service: Cardiovascular;;   POLYPECTOMY  10/21/2022   Procedure: POLYPECTOMY;  Surgeon: Jeani Hawking, MD;  Location: [facility] END[facility];  Service: Gastroenterology;;   RENAL ANGIOGRAPHY N/A 02/03/2022   Procedure: RENAL ANGIOGRAPHY;  Surgeon: Yates Decamp, MD;  Location: [facility] IN[facility] CV [facility];  Service: Cardiovascular;  Laterality: N/A;   UMBILICAL HERNIA REPAIR N/A 10/29/2020   Procedure:  OPEN UMBILICAL HERNIA REPAIR WITH MESH;  Surgeon: Stechschulte, Hyman Hopes, MD;   Location: [facility] OR;  Service: General;  Laterality: N/A;    Family History  Problem Relation Age of Onset   Colon cancer Mother    Diabetes Father    Diabetes Sister    Breast cancer Sister 67   Hypertension Sister     Social History:  reports that she has been smoking cigarettes. She has a 40 pack-year smoking history. She has never used smokeless tobacco. She reports that she does not drink alcohol and does not use drugs.  Allergies:  Allergies  Allergen Reactions   Doxycycline Anaphylaxis and Rash   Azilsartan Cough   Biaxin [Clarithromycin] Diarrhea and Other (See Comments)    Vertigo   Floxacillin [Floxacillin (Flucloxacillin)] Other (See Comments)    Vertigo   Labetalol Hcl Other (See Comments)    Cannot handle a 300 mg dose at once- must be a lesser strength    Morphine And Codeine Other (See Comments)    Headache    Medications: I have reviewed the patient's current medications. Scheduled:  Chlorhexidine Gluconate Cloth  6 each Topical Daily   ezetimibe  10 mg Oral Daily   ipratropium-albuterol  3 mL Nebulization Q6H   levothyroxine  100 mcg Oral QAC breakfast   pantoprazole  40 mg Intravenous Q12H   sodium chloride flush  3 mL Intravenous Q12H   Continuous:  sodium chloride 10 mL/hr at 07/29/2023 1012   cefTRIAXone (ROCEPHIN)  IV 1 g (08/06/2023 1014)   clevidipine Stopped (07/15/2023 0121)   dextrose 15 mL/hr at 07/15/2023 1000   norepinephrine (LEVOPHED) Adult infusion 2 mcg/min (08/04/2023 1000)      Latest Ref Rng & Units 07/28/2023    8:35 AM 08/06/2023    7:14 AM 07/29/2023    4:44 AM  BMP  Glucose 70 - 99 mg/dL 95   657   BUN 8 - 23 mg/dL 26   25   Creatinine 8.46 - 1.00 mg/dL 9.62   9.52   Sodium 841 - 145 mmol/L 133  131  126   Potassium 3.5 - 5.1 mmol/L 5.8  5.6  7.1   Chloride 98 - 111 mmol/L 98   96   CO2 22 - 32 mmol/L 8   8   Calcium 8.9 - 10.3 mg/dL 8.7   8.0       Latest Ref Rng & Units 08/01/2023    7:14 AM 08/04/2023    4:44 AM 07/29/2023    2:21 AM   CBC  WBC 4.0 - 10.5 K/uL  32.9  35.5   Hemoglobin 12.0 - 15.0 g/dL 7.5  7.3  8.9   Hematocrit 36.0 - 46.0 % 22.0  24.1  29.1   Platelets 150 - 400 K/uL  119  157    DG CHEST PORT 1 VIEW  Result Date: 08/03/2023 CLINICAL DATA:  Transfusion associated circulatory overload. EXAM: PORTABLE CHEST 1 VIEW COMPARISON:  Same day chest radiograph. FINDINGS: The heart is enlarged. Vascular calcifications are seen in the aortic arch. There is a small left pleural effusion with associated atelectasis/airspace disease, unchanged. Mild-to-moderate bilateral interstitial and airspace opacities have increased since prior exam. No pneumothorax. Degenerative changes are seen in the spine. IMPRESSION: Increased bilateral interstitial and airspace opacities may reflect pulmonary edema. Unchanged small left pleural effusion with associated atelectasis/airspace disease. Electronically Signed   By: Romona Curls M.D.   On: 07/10/2023 20:43   DG Chest 2 View  Result  Date: 08/06/2023 CLINICAL DATA:  Shortness of breath EXAM: CHEST - 2 VIEW COMPARISON:  Chest radiograph dated 05/31/2023. FINDINGS: The heart is enlarged. Vascular calcifications are seen in the aortic arch. There is a small left pleural effusion with associated atelectasis, decreased compared to prior exam. There is no right pleural effusion and the right lung is clear. There is no pneumothorax on either side. Degenerative changes are seen in the spine. IMPRESSION: Small left pleural effusion with associated atelectasis, decreased compared to prior exam. Electronically Signed   By: Romona Curls M.D.   On: 07/23/2023 14:11    Review of Systems  Unable to perform ROS: Severe respiratory distress (On BiPAP, somnolent)   Blood pressure (!) 114/32, pulse (!) 44, temperature (!) 97 F (36.1 C), temperature source Axillary, resp. rate (!) 33, height 5\' 1"  (1.549 m), weight 63.1 kg, SpO2 100%. Physical Exam Vitals and nursing note reviewed.  Constitutional:       General: She is in acute distress.     Appearance: She is normal weight. She is ill-appearing.     Comments: On BiPAP, lethargic  HENT:     Head: Normocephalic.     Right Ear: External ear normal.     Left Ear: External ear normal.  Eyes:     Conjunctiva/sclera: Conjunctivae normal.  Cardiovascular:     Rate and Rhythm: Regular rhythm. Bradycardia present.     Pulses: Normal pulses.     Heart sounds: Normal heart sounds.  Pulmonary:     Breath sounds: Normal breath sounds. No wheezing or rales.  Abdominal:     General: Abdomen is flat. Bowel sounds are normal.     Palpations: Abdomen is soft.  Musculoskeletal:     Cervical back: Normal range of motion and neck supple.     Right lower leg: No edema.  Skin:    General: Skin is warm and dry.     Coloration: Skin is pale.     Assessment/Plan: 1.  Acute kidney injury: Likely hemodynamically mediated with significant blood pressure fluctuations over the last 24 hours possibly evolving into ischemic ATN.  Her response to diuretic trial portends high likelihood of needing renal replacement therapy for management of hyperkalemia/volume status.  Repeat labs pending at this time and I will await urinalysis and urine electrolytes to further confirm clinical suspicion.  Her accelerated hypertension +/- flash pulmonary edema may indeed be related to findings seen on her imaging from 05/28/2023 with severe atherosclerotic disease of the abdominal aorta and stenosis of the renal arteries.  Unclear utility/benefit of obtaining renal artery Doppler given multilevel disease. Avoid nephrotoxic medications including NSAIDs and iodinated intravenous contrast exposure unless the latter is absolutely indicated.  Preferred narcotic agents for pain control are hydromorphone, fentanyl, and methadone. Morphine should not be used. Avoid Baclofen and avoid oral sodium phosphate and magnesium citrate based laxatives / bowel preps. Continue strict Input and Output  monitoring. Will monitor the patient closely with you and intervene or adjust therapy as indicated by changes in clinical status/labs. 2.  Hyperkalemia: Secondary to preceding spironolactone use as well as iatrogenic from earlier supplementation now compounded by acute kidney injury and impaired potassium excretion.  Await repeat labs to decide on need for medical management versus renal replacement therapy. 3.  Anion gap metabolic acidosis: Likely from acute kidney injury with unremarkable lactic acid level.  Marginally elevated BHB.  Treated with sodium bicarbonate to temporize/improve potassium and may need renal replacement. 4.  Anemia: Likely related to acute  blood loss/GI losses.  Seen yesterday by gastroenterology. 5.  History of diastolic heart failure: Clinical features suggestive of exacerbation, attempting diuresis has been unsuccessful and may need RRT. 6.  Hypertension: Earlier in admission now with relative hypotension for which she is on low-dose pressor to help improve renal perfusion.  Plausibly related to renal artery stenosis and may need vascular surgery evaluation at some point when more stable.   Dagoberto Ligas 07/16/2023, 11:58 AM

## 2023-07-11 NOTE — Progress Notes (Signed)
Pt extubated at this time and placed on 2L [place].

## 2023-07-11 NOTE — Consult Note (Signed)
Reason for Consult: Ischemic bowel Referring Physician: Dr. Devona Konig is an 67 y.o. female.  HPI: Patient is a 66 year old female who upon my exam is intubated.  History is gathered by reviewing the chart.  Patient has significant past medical history for hypertension, dyslipidemia, hypothyroidism, PVD, COPD, atherosclerotic aorta with multilevel disease, aortic valve stenosis, diastolic heart failure.  Patient presented to the hospital yesterday with progressively worsening weakness abdominal discomfort nausea shortness of breath.  Patient was found to have anemia with a history of dark stools.  She did require noninvasive ventilation.  She was transferred to the ICU.  Patient was noted to have increasing lactate levels.  Patient with elevated LFTs.  Patient underwent endoscopy today.  This was significant for dusky/ischemic mucosa of the duodenum. Patient also underwent CT angiogram of her abdomen and pelvis.  This was significant for significant plaque at the celiac and SMA takeoff.  There is minimal if any contrast within the small bowel mesentery.  Patient was requiring increasing levels of pressure support.  Patient had a worsening lactic acidosis.  We were consulted for further evaluation and management.  Past Medical History:  Diagnosis Date   Acute exacerbation of chronic obstructive pulmonary disease (COPD) (HCC) 05/28/2023   Acute on chronic diastolic CHF (congestive heart failure) (HCC) 10/18/2022   Anemia    Anxiety    Asthma    COPD (chronic obstructive pulmonary disease) (HCC)    Depression    GERD (gastroesophageal reflux disease)    History of kidney stones    Hypertension    Hypothyroidism     Past Surgical History:  Procedure Laterality Date   ABDOMINAL AORTOGRAM W/LOWER EXTREMITY N/A 02/03/2022   Procedure: ABDOMINAL AORTOGRAM W/LOWER EXTREMITY;  Surgeon: Yates Decamp, MD;  [facility]: [facility];  Service: Cardiovascular;  Laterality: N/A;    ABDOMINAL HYSTERECTOMY     BIOPSY  10/21/2022   Procedure: BIOPSY;  Surgeon: Jeani Hawking, MD;  Location: [facility] ENDOSCOPY;  Service: Gastroenterology;;   Cesarean Section     (2)   CHOLECYSTECTOMY     COLONOSCOPY  2020   COLONOSCOPY WITH PROPOFOL N/A 10/21/2022   Procedure: COLONOSCOPY WITH PROPOFOL;  Surgeon: Jeani Hawking, MD;  Location: [facility] ENDOSCOPY;  Service: Gastroenterology;  Laterality: N/A;   COLONOSCOPY WITH PROPOFOL N/A 06/02/2023   Procedure: COLONOSCOPY WITH PROPOFOL;  Surgeon: Jeani Hawking, MD;  Location: [facility] ENDOSCOPY;  Service: Gastroenterology;  Laterality: N/A;   ESOPHAGOGASTRODUODENOSCOPY (EGD) WITH PROPOFOL N/A 10/21/2022   Procedure: ESOPHAGOGASTRODUODENOSCOPY (EGD) WITH PROPOFOL;  Surgeon: Jeani Hawking, MD;  Location: [facility] ENDOSCOPY;  Service: Gastroenterology;  Laterality: N/A;   Gastric Ulcer  2020   GIVENS CAPSULE STUDY N/A 05/31/2023   Procedure: GIVENS CAPSULE STUDY;  Surgeon: Jeani Hawking, MD;  Location: [facility] ENDOSCOPY;  Service: Gastroenterology;  Laterality: N/A;   PERIPHERAL VASCULAR BALLOON ANGIOPLASTY  02/03/2022   Procedure: PERIPHERAL VASCULAR BALLOON ANGIOPLASTY;  Surgeon: Yates Decamp, MD;  [facility]: [facility];  Service: Cardiovascular;;   POLYPECTOMY  10/21/2022   Procedure: POLYPECTOMY;  Surgeon: Jeani Hawking, MD;  Location: [facility] ENDOSCOPY;  Service: Gastroenterology;;   RENAL ANGIOGRAPHY N/A 02/03/2022   Procedure: RENAL ANGIOGRAPHY;  Surgeon: Yates Decamp, MD;  Location: [facility] INVASIVE CV LAB;  Service: Cardiovascular;  Laterality: N/A;   UMBILICAL HERNIA REPAIR N/A 10/29/2020   Procedure: OPEN UMBILICAL HERNIA REPAIR WITH MESH;  Surgeon: Stechschulte, Hyman Hopes, MD;  Location: [facility] OR;  Service: General;  Laterality: N/A;    Family History  Problem Relation  Age of Onset   Colon cancer Mother    Diabetes Father    Diabetes Sister    Breast cancer Sister 12   Hypertension Sister     Social History:  reports that she has been smoking cigarettes. She has a  40 pack-year smoking history. She has never used smokeless tobacco. She reports that she does not drink alcohol and does not use drugs.  Allergies:  Allergies  Allergen Reactions   Doxycycline Anaphylaxis and Rash   Azilsartan Cough   Biaxin [Clarithromycin] Diarrhea and Other (See Comments)    Vertigo   Floxacillin [Floxacillin (Flucloxacillin)] Other (See Comments)    Vertigo   Labetalol Hcl Other (See Comments)    Cannot handle a 300 mg dose at once- must be a lesser strength    Morphine And Codeine Other (See Comments)    Headache    Medications: I have reviewed the patient's current medications.  Results for orders placed or performed during the hospital encounter of 08/02/2023 (from the past 48 hour(s))  CBC     Status: Abnormal   Collection Time: 07/21/2023 12:56 PM  Result Value Ref Range   WBC 10.5 4.0 - 10.5 K/uL   RBC 2.13 (L) 3.87 - 5.11 MIL/uL   Hemoglobin 5.8 (LL) 12.0 - 15.0 g/dL    Comment: REPEATED TO VERIFY THIS CRITICAL RESULT HAS VERIFIED AND BEEN CALLED TO C. KHOURI RN BY MARY ALAMANO ON 09 02 2024 AT 1357, AND HAS BEEN READ BACK.     HCT 18.9 (L) 36.0 - 46.0 %   MCV 88.7 80.0 - 100.0 fL   MCH 27.2 26.0 - 34.0 pg   MCHC 30.7 30.0 - 36.0 g/dL   RDW 09.4 07.6 - 80.8 %   Platelets 246 150 - 400 K/uL   nRBC 0.0 0.0 - 0.2 %    Comment: Performed at [facility] Lab, [address], [place], [place] [address]  Comprehensive metabolic panel     Status: Abnormal   Collection Time: 08/06/2023 12:56 PM  Result Value Ref Range   Sodium 131 (L) 135 - 145 mmol/L   Potassium 3.2 (L) 3.5 - 5.1 mmol/L   Chloride 95 (L) 98 - 111 mmol/L   CO2 23 22 - 32 mmol/L   Glucose, Bld 118 (H) 70 - 99 mg/dL    Comment: Glucose reference range applies only to samples taken after fasting for at least 8 hours.   BUN 17 8 - 23 mg/dL   Creatinine, Ser 1.59 0.44 - 1.00 mg/dL   Calcium 8.7 (L) 8.9 - 10.3 mg/dL   Total Protein 6.1 (L) 6.5 - 8.1 g/dL   Albumin 3.9 3.5 - 5.0 g/dL   AST  24 15 - 41 U/L   ALT 21 0 - 44 U/L   Alkaline Phosphatase 40 38 - 126 U/L   Total Bilirubin 0.8 0.3 - 1.2 mg/dL   GFR, Estimated >45 >85 mL/min    Comment: (NOTE) Calculated using the CKD-EPI Creatinine Equation (2021)    Anion gap 13 5 - 15    Comment: Performed at [facility], [address], [place], [place] [address]  Lipase, blood     Status: None   Collection Time: 07/09/2023 12:56 PM  Result Value Ref Range   Lipase 36 11 - 51 U/L    Comment: Performed at [facility], [address] [place], [place] [address]  Brain natriuretic peptide     Status: Abnormal  Collection Time: 07/14/2023 12:56 PM  Result Value Ref Range   B Natriuretic Peptide 1,813.7 (H) 0.0 - 100.0 pg/mL    Comment: Performed at [facility] Lab, [address] [place], [place] [address]  Troponin I (High Sensitivity)     Status: Abnormal   Collection Time: 07/15/2023 12:56 PM  Result Value Ref Range   Troponin I (High Sensitivity) 95 (H) <18 ng/L    Comment: (NOTE) Elevated high sensitivity troponin I (hsTnI) values and significant  changes across serial measurements may suggest ACS but many other  chronic and acute conditions are known to elevate hsTnI results.  Refer to the "Links" section for chest pain algorithms and additional  guidance. Performed at [facility] Lab, [address] [place], [place] [address]   Magnesium     Status: None   Collection Time: 07/22/2023 12:56 PM  Result Value Ref Range   Magnesium 1.8 1.7 - 2.4 mg/dL    Comment: Performed at [facility], [address] [place], [place] [address]  Type and screen [facility]     Status: None   Collection Time: 08/04/2023  1:00 PM  Result Value Ref Range   ABO/RH(D) O POS    Antibody Screen NEG    Sample Expiration 07/13/2023,2359    Unit Number [id number]    Blood Component Type RED CELLS,LR    Unit division 00    Status of Unit ISSUED,FINAL    Transfusion Status OK TO TRANSFUSE     Crossmatch Result      Compatible Performed at [facility], [address], [place], [place] [address]    Unit Number [id number]    Blood Component Type RED CELLS,LR    Unit division 00    Status of Unit ISSUED,FINAL    Transfusion Status OK TO TRANSFUSE    Crossmatch Result Compatible   POC occult blood, ED     Status: Abnormal   Collection Time: 07/25/2023  1:09 PM  Result Value Ref Range   Fecal Occult Bld POSITIVE (A) NEGATIVE  I-Stat CG4 Lactic Acid     Status: None   Collection Time: 07/09/2023  1:10 PM  Result Value Ref Range   Lactic Acid, Venous 1.0 0.5 - 1.9 mmol/L  Prepare RBC (crossmatch)     Status: None   Collection Time: 07/27/2023  2:30 PM  Result Value Ref Range   Order Confirmation      ORDERS RECEIVED TO CROSSMATCH Performed at [facility], [address], [place], [place] [address]   Troponin I (High Sensitivity)     Status: Abnormal   Collection Time: 07/30/2023  3:07 PM  Result Value Ref Range   Troponin I (High Sensitivity) 101 (HH) <18 ng/L    Comment: CRITICAL RESULT CALLED TO, READ BACK BY AND VERIFIED WITH Nicholes Rough, RN @ [phone] 07/24/2023 BY SEKDAHL (NOTE) Elevated high sensitivity troponin I (hsTnI) values and significant  changes across serial measurements may suggest ACS but many other  chronic and acute conditions are known to elevate hsTnI results.  Refer to the "Links" section for chest pain algorithms and additional  guidance. Performed at [facility], [address] [place], [place] [address]   Basic metabolic panel     Status: Abnormal   Collection Time: 08/01/2023  9:35 PM  Result Value Ref Range   Sodium 127 (L) 135 - 145 mmol/L   Potassium 5.2 (H) 3.5 - 5.1 mmol/L  Chloride 97 (L) 98 - 111 mmol/L   CO2 17 (L) 22 - 32 mmol/L   Glucose, Bld 284 (H) 70 - 99 mg/dL    Comment: Glucose reference range applies only to samples taken after fasting for at least 8 hours.   BUN 23 8 - 23 mg/dL   Creatinine, Ser 4.09 (H) 0.44 -  1.00 mg/dL   Calcium 8.4 (L) 8.9 - 10.3 mg/dL   GFR, Estimated 38 (L) >60 mL/min    Comment: (NOTE) Calculated using the CKD-EPI Creatinine Equation (2021)    Anion gap 13 5 - 15    Comment: Performed at [facility], [address], [place], [place] [address]  Troponin I (High Sensitivity)     Status: Abnormal   Collection Time: 08/07/2023  9:36 PM  Result Value Ref Range   Troponin I (High Sensitivity) 214 (HH) <18 ng/L    Comment: CRITICAL VALUE NOTED. VALUE IS CONSISTENT WITH PREVIOUSLY REPORTED/CALLED VALUE (NOTE) Elevated high sensitivity troponin I (hsTnI) values and significant  changes across serial measurements may suggest ACS but many other  chronic and acute conditions are known to elevate hsTnI results.  Refer to the "Links" section for chest pain algorithms and additional  guidance. Performed at [facility], [address], [place], [place] [address]   Transfusion reaction     Status: None (Preliminary result)   Collection Time: 08/06/2023 10:02 PM  Result Value Ref Range   Post RXN DAT IgG NEG    DAT C3      NEG Performed at [facility], [address], [place], [place] [address]    Path interp tx rxn PENDING   MRSA Next Gen by PCR, Nasal     Status: None   Collection Time: 07/25/2023 10:19 PM   Specimen: Nasal Mucosa; Nasal Swab  Result Value Ref Range   MRSA by PCR Next Gen NOT DETECTED NOT DETECTED    Comment: (NOTE) The GeneXpert MRSA Assay (FDA approved for NASAL specimens only), is one component of a comprehensive MRSA colonization surveillance program. It is not intended to diagnose MRSA infection nor to guide or monitor treatment for MRSA infections. Test performance is not FDA approved in patients less than 61 years old. Performed at [facility], [address] [place], [place] [address]   Glucose, capillary     Status: Abnormal   Collection Time: 07/26/2023 10:21 PM  Result Value Ref Range   Glucose-Capillary 225 (H) 70 - 99  mg/dL    Comment: Glucose reference range applies only to samples taken after fasting for at least 8 hours.  Hemoglobin and hematocrit, blood     Status: Abnormal   Collection Time: 07/09/2023 11:33 PM  Result Value Ref Range   Hemoglobin 9.1 (L) 12.0 - 15.0 g/dL    Comment: REPEATED TO VERIFY POST TRANSFUSION SPECIMEN    HCT 28.1 (L) 36.0 - 46.0 %    Comment: Performed at [facility], [address] [place], [place] [address]  Glucose, capillary     Status: Abnormal   Collection Time: 08/04/2023 11:41 PM  Result Value Ref Range   Glucose-Capillary 140 (H) 70 - 99 mg/dL    Comment: Glucose reference range applies only to samples taken after fasting for at least 8 hours.  CBC     Status: Abnormal   Collection Time: 07/15/2023  2:21 AM  Result Value Ref Range   WBC 35.5 (H) 4.0 - 10.5 K/uL  RBC 3.13 (L) 3.87 - 5.11 MIL/uL   Hemoglobin 8.9 (L) 12.0 - 15.0 g/dL   HCT 09.8 (L) 11.9 - 14.7 %   MCV 93.0 80.0 - 100.0 fL   MCH 28.4 26.0 - 34.0 pg   MCHC 30.6 30.0 - 36.0 g/dL   RDW 82.9 56.2 - 13.0 %   Platelets 157 150 - 400 K/uL   nRBC 0.7 (H) 0.0 - 0.2 %    Comment: Performed at [facility], [address] [place], [place] [address]  Glucose, capillary     Status: Abnormal   Collection Time: 07/14/2023  4:00 AM  Result Value Ref Range   Glucose-Capillary <10 (LL) 70 - 99 mg/dL    Comment: Glucose reference range applies only to samples taken after fasting for at least 8 hours.  Glucose, capillary     Status: Abnormal   Collection Time: 07/18/2023  4:16 AM  Result Value Ref Range   Glucose-Capillary 522 (HH) 70 - 99 mg/dL    Comment: Glucose reference range applies only to samples taken after fasting for at least 8 hours.   Comment 1 Call MD NNP PA CNM   Glucose, capillary     Status: Abnormal   Collection Time: 07/10/2023  4:29 AM  Result Value Ref Range   Glucose-Capillary 414 (H) 70 - 99 mg/dL    Comment: Glucose reference range applies only to samples taken after fasting  for at least 8 hours.  CBC     Status: Abnormal   Collection Time: 07/16/2023  4:44 AM  Result Value Ref Range   WBC 32.9 (H) 4.0 - 10.5 K/uL   RBC 2.54 (L) 3.87 - 5.11 MIL/uL   Hemoglobin 7.3 (L) 12.0 - 15.0 g/dL   HCT 46.9 (L) 62.9 - 52.8 %   MCV 94.9 80.0 - 100.0 fL   MCH 28.7 26.0 - 34.0 pg   MCHC 30.3 30.0 - 36.0 g/dL   RDW 41.3 24.4 - 01.0 %   Platelets 119 (L) 150 - 400 K/uL    Comment: REPEATED TO VERIFY   nRBC 1.0 (H) 0.0 - 0.2 %    Comment: Performed at [facility], [address] [place], [place] [address]  Comprehensive metabolic panel     Status: Abnormal   Collection Time: 08/04/2023  4:44 AM  Result Value Ref Range   Sodium 126 (L) 135 - 145 mmol/L   Potassium 7.1 (HH) 3.5 - 5.1 mmol/L    Comment: CRITICAL RESULT CALLED TO, READ BACK BY AND VERIFIED WITH SCOTT, B. RN @ [phone] 08/05/2023 JBUTLER   Chloride 96 (L) 98 - 111 mmol/L   CO2 8 (L) 22 - 32 mmol/L   Glucose, Bld 384 (H) 70 - 99 mg/dL    Comment: Glucose reference range applies only to samples taken after fasting for at least 8 hours.   BUN 25 (H) 8 - 23 mg/dL   Creatinine, Ser 0.34 (H) 0.44 - 1.00 mg/dL   Calcium 8.0 (L) 8.9 - 10.3 mg/dL   Total Protein 5.0 (L) 6.5 - 8.1 g/dL   Albumin 3.0 (L) 3.5 - 5.0 g/dL   AST 742 (H) 15 - 41 U/L   ALT 972 (H) 0 - 44 U/L   Alkaline Phosphatase 59 38 - 126 U/L   Total Bilirubin 3.1 (H) 0.3 - 1.2 mg/dL   GFR, Estimated 22 (L) >60 mL/min    Comment: (NOTE) Calculated using the CKD-EPI Creatinine Equation (2021)    Anion  gap 22 (H) 5 - 15    Comment: ELECTROLYTES REPEATED TO VERIFY Performed at [facility], [address], [place], [place] [address]   CBC with Differential/Platelet     Status: Abnormal   Collection Time: 07/29/2023  4:44 AM  Result Value Ref Range   WBC 33.3 (H) 4.0 - 10.5 K/uL   RBC 2.59 (L) 3.87 - 5.11 MIL/uL   Hemoglobin 7.6 (L) 12.0 - 15.0 g/dL   HCT 19.1 (L) 47.8 - 29.5 %   MCV 96.9 80.0 - 100.0 fL   MCH 29.3 26.0 - 34.0 pg   MCHC 30.3  30.0 - 36.0 g/dL   RDW 62.1 30.8 - 65.7 %   Platelets 116 (L) 150 - 400 K/uL   nRBC 1.1 (H) 0.0 - 0.2 %   Neutrophils Relative % 93 %   Neutro Abs 31.3 (H) 1.7 - 7.7 K/uL   Lymphocytes Relative 2 %   Lymphs Abs 0.8 0.7 - 4.0 K/uL   Monocytes Relative 3 %   Monocytes Absolute 0.8 0.1 - 1.0 K/uL   Eosinophils Relative 0 %   Eosinophils Absolute 0.0 0.0 - 0.5 K/uL   Basophils Relative 0 %   Basophils Absolute 0.1 0.0 - 0.1 K/uL   Immature Granulocytes 2 %   Abs Immature Granulocytes 0.79 (H) 0.00 - 0.07 K/uL    Comment: Performed at [facility], [address], [place], [place] [address]  Technologist smear review     Status: None   Collection Time: 07/09/2023  4:44 AM  Result Value Ref Range   WBC MORPHOLOGY MORPHOLOGY UNREMARKABLE    RBC MORPHOLOGY POLYCHROMASIA PRESENT     Comment: BURR CELLS   Plt Morphology Normal platelet morphology    Clinical Information Elevated white count     Comment: Performed at [facility], [address], [place], [place] [address]  Glucose, capillary     Status: Abnormal   Collection Time: 07/20/2023  5:22 AM  Result Value Ref Range   Glucose-Capillary 260 (H) 70 - 99 mg/dL    Comment: Glucose reference range applies only to samples taken after fasting for at least 8 hours.   Comment 1 Document in Chart   Glucose, capillary     Status: Abnormal   Collection Time: 08/04/2023  6:22 AM  Result Value Ref Range   Glucose-Capillary 169 (H) 70 - 99 mg/dL    Comment: Glucose reference range applies only to samples taken after fasting for at least 8 hours.  I-STAT 7, (LYTES, BLD GAS, ICA, H+H)     Status: Abnormal   Collection Time: 07/19/2023  7:14 AM  Result Value Ref Range   pH, Arterial 7.263 (L) 7.35 - 7.45   pCO2 arterial 19.2 (LL) 32 - 48 mmHg   pO2, Arterial 123 (H) 83 - 108 mmHg   Bicarbonate 8.7 (L) 20.0 - 28.0 mmol/L   TCO2 9 (L) 22 - 32 mmol/L   O2 Saturation 98 %   Acid-base deficit 17.0 (H) 0.0 - 2.0 mmol/L   Sodium 131 (L) 135 -  145 mmol/L   Potassium 5.6 (H) 3.5 - 5.1 mmol/L   Calcium, Ion 1.04 (L) 1.15 - 1.40 mmol/L   HCT 22.0 (L) 36.0 - 46.0 %   Hemoglobin 7.5 (L) 12.0 - 15.0 g/dL   Sample type ARTERIAL    Comment NOTIFIED PHYSICIAN   Glucose, capillary     Status: Abnormal   Collection Time: 07/30/2023  7:40 AM  Result Value Ref Range   Glucose-Capillary 101 (H) 70 - 99 mg/dL    Comment: Glucose reference range applies only to samples taken after fasting for at least 8 hours.  Basic metabolic panel     Status: Abnormal   Collection Time: 08/02/2023  8:35 AM  Result Value Ref Range   Sodium 133 (L) 135 - 145 mmol/L   Potassium 5.8 (H) 3.5 - 5.1 mmol/L   Chloride 98 98 - 111 mmol/L   CO2 8 (L) 22 - 32 mmol/L   Glucose, Bld 95 70 - 99 mg/dL    Comment: Glucose reference range applies only to samples taken after fasting for at least 8 hours.   BUN 26 (H) 8 - 23 mg/dL   Creatinine, Ser 8.29 (H) 0.44 - 1.00 mg/dL   Calcium 8.7 (L) 8.9 - 10.3 mg/dL   GFR, Estimated 21 (L) >60 mL/min    Comment: (NOTE) Calculated using the CKD-EPI Creatinine Equation (2021)    Anion gap 27 (H) 5 - 15    Comment: ELECTROLYTES REPEATED TO VERIFY Performed at [facility], [address] [place], [place] [address]   Beta-hydroxybutyric acid     Status: Abnormal   Collection Time: 08/03/2023  9:18 AM  Result Value Ref Range   Beta-Hydroxybutyric Acid 0.30 (H) 0.05 - 0.27 mmol/L    Comment: Performed at [facility], [address] [place], [place] [address]  Glucose, capillary     Status: None   Collection Time: 08/02/2023 11:29 AM  Result Value Ref Range   Glucose-Capillary 95 70 - 99 mg/dL    Comment: Glucose reference range applies only to samples taken after fasting for at least 8 hours.  Basic metabolic panel     Status: Abnormal   Collection Time: 07/10/2023 11:42 AM  Result Value Ref Range   Sodium 131 (L) 135 - 145 mmol/L   Potassium 6.6 (HH) 3.5 - 5.1 mmol/L    Comment: REPEATED TO VERIFY CRITICAL RESULT  CALLED TO, READ BACK BY AND VERIFIED WITH Spero Geralds, RN 1308 07/20/2023 L. KLAR    Chloride 94 (L) 98 - 111 mmol/L   CO2 8 (L) 22 - 32 mmol/L   Glucose, Bld 100 (H) 70 - 99 mg/dL    Comment: Glucose reference range applies only to samples taken after fasting for at least 8 hours.   BUN 26 (H) 8 - 23 mg/dL   Creatinine, Ser 8.46 (H) 0.44 - 1.00 mg/dL   Calcium 8.2 (L) 8.9 - 10.3 mg/dL   GFR, Estimated 19 (L) >60 mL/min    Comment: (NOTE) Calculated using the CKD-EPI Creatinine Equation (2021)    Anion gap 29 (H) 5 - 15    Comment: ELECTROLYTES REPEATED TO VERIFY Performed at [facility], [address] [place], [place] [address]   Lactic acid, plasma     Status: Abnormal   Collection Time: 07/28/2023 11:42 AM  Result Value Ref Range   Lactic Acid, Venous >9.0 (HH) 0.5 - 1.9 mmol/L    Comment: CRITICAL RESULT CALLED TO, READ BACK BY AND VERIFIED WITH Spero Geralds, RN 1223 08/04/2023 L. KLAR Performed at [facility], [address] [place], [place] [address]   Salicylate level     Status: None   Collection Time: 07/22/2023 11:42 AM  Result Value Ref Range   Salicylate Lvl 7.9 7.0 - 30.0 mg/dL    Comment: Performed at [facility], [address],  [place], [place] [address]  Volatiles,Blood (acetone,ethanol,isoprop,methanol)     Status: None   Collection Time: 07/16/2023 11:42 AM  Result Value Ref Range   Acetone, blood <0.010 0.000 - 0.010 g/dL    Comment: (NOTE)                                Detection Limit = 0.010 A courtesy copy of this report has been sent to [facility], [phone] Performed At: [facility] [address] [place], [place] [id number] Jolene Schimke MD UY:[id number]    Ethanol, blood <0.010 0.000 - 0.010 g/dL    Comment: (NOTE) Called To: Daisy B. Called By: Sherryl Barters Time: 5:20 PM Date: 07/10/2023                                Detection Limit = 0.010 This test was developed and its performance characteristics determined  by [facility]. It has not been cleared or approved by the Food and Drug Administration.    Isopropanol, blood <0.010 0.000 - 0.010 g/dL    Comment:                                 Detection Limit = 0.010   Methanol, blood <0.010 0.000 - 0.010 g/dL    Comment:                                 Detection Limit = 0.010  I-STAT 7, (LYTES, BLD GAS, ICA, H+H)     Status: Abnormal   Collection Time: 08/06/2023  2:03 PM  Result Value Ref Range   pH, Arterial 7.255 (L) 7.35 - 7.45   pCO2 arterial 37.8 32 - 48 mmHg   pO2, Arterial 363 (H) 83 - 108 mmHg   Bicarbonate 16.8 (L) 20.0 - 28.0 mmol/L   TCO2 18 (L) 22 - 32 mmol/L   O2 Saturation 100 %   Acid-base deficit 10.0 (H) 0.0 - 2.0 mmol/L   Sodium 132 (L) 135 - 145 mmol/L   Potassium 6.5 (HH) 3.5 - 5.1 mmol/L   Calcium, Ion 0.85 (LL) 1.15 - 1.40 mmol/L   HCT 19.0 (L) 36.0 - 46.0 %   Hemoglobin 6.5 (LL) 12.0 - 15.0 g/dL   Sample type ARTERIAL    Comment NOTIFIED PHYSICIAN   Lactic acid, plasma     Status: Abnormal   Collection Time: 07/16/2023  2:32 PM  Result Value Ref Range   Lactic Acid, Venous >9.0 (HH) 0.5 - 1.9 mmol/L    Comment: CRITICAL VALUE NOTED. VALUE IS CONSISTENT WITH PREVIOUSLY REPORTED/CALLED VALUE Performed at [facility] Lab, [address], [place], [place] [address]   Renal function panel (daily at 1600)     Status: Abnormal   Collection Time: 07/16/2023  3:32 PM  Result Value Ref Range   Sodium 133 (L) 135 - 145 mmol/L   Potassium 7.0 (HH) 3.5 - 5.1 mmol/L    Comment: CRITICAL RESULT CALLED TO, READ BACK BY AND VERIFIED WITH M. WOOD, RN AT 1725 09.03.24 D. BLU   Chloride 93 (L) 98 - 111 mmol/L   CO2 12 (L) 22 - 32 mmol/L   Glucose, Bld 74 70 - 99 mg/dL    Comment: Glucose reference range applies  only to samples taken after fasting for at least 8 hours.   BUN 29 (H) 8 - 23 mg/dL   Creatinine, Ser 6.44 (H) 0.44 - 1.00 mg/dL   Calcium 7.5 (L) 8.9 - 10.3 mg/dL   Phosphorus >03.4 (H) 2.5 - 4.6 mg/dL    Comment: RESULT  CONFIRMED BY MANUAL DILUTION   Albumin 2.3 (L) 3.5 - 5.0 g/dL   GFR, Estimated 18 (L) >60 mL/min    Comment: (NOTE) Calculated using the CKD-EPI Creatinine Equation (2021)    Anion gap 28 (H) 5 - 15    Comment: ELECTROLYTES REPEATED TO VERIFY Performed at [facility], [address], [place], [place] [address]     CT Angio Abd/Pel w/ and/or w/o  Result Date: 07/10/2023 CLINICAL DATA:  67 year old female with concern for mesenteric ischemia. EXAM: CT ANGIOGRAPHY ABDOMEN AND PELVIS WITH CONTRAST AND WITHOUT CONTRAST TECHNIQUE: Multidetector CT imaging of the abdomen and pelvis was performed using the standard protocol during bolus administration of intravenous contrast. Multiplanar reconstructed images and MIPs were obtained and reviewed to evaluate the vascular anatomy. RADIATION DOSE REDUCTION: This exam was performed according to the departmental dose-optimization program which includes automated exposure control, adjustment of the mA and/or kV according to patient size and/or use of iterative reconstruction technique. CONTRAST:  OMNIPAQUE IOHEXOL 350 MG/ML SOLN COMPARISON:  05/28/2023 FINDINGS: VASCULAR Aorta: Extensive coarse fibrofatty and calcific atherosclerotic changes throughout the abdominal aorta, most prominent at the level of the renal artery ostia were there is severe focal stenosis. Celiac: Moderate ostial stenosis secondary to atherosclerotic plaque. SMA: There is a new proximal occlusion spanning approximately 4.5 cm with diminutive, distal reconstitution. Renals: The single bilateral renal arteries are diminutive bilaterally, significantly worse than comparison. IMA: Patent without evidence of aneurysm, dissection, vasculitis or significant stenosis. Inflow: Coarse atherosclerotic calcifications extend to the common iliac arteries bilaterally. The right common, internal common external iliac arteries are diminutive however appear patent. The left common iliac artery is  normal in caliber with multifocal moderate stenoses. The left internal external iliac arteries are diminutive throughout, however patent. Proximal Outflow: Bilateral common femoral and visualized portions of the superficial and profunda femoral arteries are patent, however diminutive. Veins: The hepatic veins are widely patent. The portal system is widely patent and normal in caliber. The renal veins are patent bilaterally in standard anatomic configuration. No evidence of iliocaval thrombosis or anomaly. Review of the MIP images confirms the above findings. NON-VASCULAR Lower chest: Small right and trace left pleural effusions, increased from comparison with associated bibasilar passive atelectasis. Unchanged global cardiomegaly. Hepatobiliary: No focal liver abnormality is seen. Status post cholecystectomy. No biliary dilatation. Pancreas: Unremarkable. No pancreatic ductal dilatation or surrounding inflammatory changes. Spleen: Normal in size without focal abnormality. Adrenals/Urinary Tract: Adrenal glands are unremarkable. Unchanged large, right greater than left nonobstructive bilateral renal calculi. The kidneys are otherwise normal without focal lesion or hydronephrosis. Bladder is decompressed with Foley catheter in place. Stomach/Bowel: Stomach is within normal limits with gastric decompression tube coiled in the body. There is minimal mild dilation of the small bowel without evidence of transition point with scattered air-fluid levels. Appendix is not definitively identified. No evidence of bowel wall thickening or inflammatory changes. Lymphatic: No abdominopelvic lymphadenopathy. Reproductive: Status post hysterectomy. No adnexal masses. Other: No abdominal wall hernia or abnormality. No abdominopelvic ascites. Musculoskeletal: No acute or significant osseous findings. IMPRESSION: 1. Interval progression of "coral reef" coarse atherosclerotic changes about the abdominal aorta resulting in subacute to  chronic  appearing attenuation of the bilateral renal arteries and occlusion of the proximal superior mesenteric artery. Given unchanged moderate ostial stenosis of the celiac, these findings support clinical diagnosis of mesenteric ischemia, with vascular morphology suggestive of subacute to chronic onset. 2. No evidence of pneumatosis.  Suggestion of ileus. 3. Enlarging small right and trace left pleural effusions with associated bibasal passive atelectasis. 4. Unchanged cardiomegaly. 5. Unchanged nonobstructive bilateral renal calculi. Marliss Coots, MD [facility] Radiology Electronically Signed   By: Marliss Coots M.D.   On: 08/02/2023 17:01   DG CHEST PORT 1 VIEW  Result Date: 07/14/2023 CLINICAL DATA:  Endotracheal tube placement. EXAM: PORTABLE CHEST 1 VIEW COMPARISON:  July 10, 2023. FINDINGS: Endotracheal tube is in grossly good position. Nasogastric tube is seen entering stomach. Stable cardiomegaly. Bibasilar atelectasis or infiltrates are noted with associated effusions. IMPRESSION: Endotracheal and nasogastric tubes are in grossly good position. Bibasilar opacities as noted above. Electronically Signed   By: Lupita Raider M.D.   On: 07/14/2023 15:59   DG Abd Portable 1V  Result Date: 07/22/2023 CLINICAL DATA:  Abdominal pain.  Tube placement. EXAM: PORTABLE ABDOMEN - 1 VIEW COMPARISON:  April 20, 2007. FINDINGS: The bowel gas pattern is normal. Distal tip of nasogastric tube is seen in proximal stomach. No radio-opaque calculi or other significant radiographic abnormality are seen. IMPRESSION: Distal tip of nasogastric tube is seen in proximal stomach. Electronically Signed   By: Lupita Raider M.D.   On: 08/03/2023 15:58   DG CHEST PORT 1 VIEW  Result Date: 08/01/2023 CLINICAL DATA:  Transfusion associated circulatory overload. EXAM: PORTABLE CHEST 1 VIEW COMPARISON:  Same day chest radiograph. FINDINGS: The heart is enlarged. Vascular  calcifications are seen in the aortic arch. There is a small left pleural effusion with associated atelectasis/airspace disease, unchanged. Mild-to-moderate bilateral interstitial and airspace opacities have increased since prior exam. No pneumothorax. Degenerative changes are seen in the spine. IMPRESSION: Increased bilateral interstitial and airspace opacities may reflect pulmonary edema. Unchanged small left pleural effusion with associated atelectasis/airspace disease. Electronically Signed   By: Romona Curls M.D.   On: 07/14/2023 20:43   DG Chest 2 View  Result Date: 07/23/2023 CLINICAL DATA:  Shortness of breath EXAM: CHEST - 2 VIEW COMPARISON:  Chest radiograph dated 05/31/2023. FINDINGS: The heart is enlarged. Vascular calcifications are seen in the aortic arch. There is a small left pleural effusion with associated atelectasis, decreased compared to prior exam. There is no right pleural effusion and the right lung is clear. There is no pneumothorax on either side. Degenerative changes are seen in the spine. IMPRESSION: Small left pleural effusion with associated atelectasis, decreased compared to prior exam. Electronically Signed   By: Romona Curls M.D.   On: 08/02/2023 14:11    Review of Systems  Unable to perform ROS: Intubated   Blood pressure (!) 103/32, pulse (!) 42, temperature (!) 97 F (36.1 C), temperature source Axillary, resp. rate (!) 24, height 5\' 1"  (1.549 m), weight 63.1 kg, SpO2 100%. Physical Exam Constitutional:      Appearance: She is well-developed.     Interventions: She is intubated.     Comments: Intubated and sedated  HENT:     Head: Normocephalic and atraumatic.  Eyes:     General: No scleral icterus.    Pupils: Pupils are equal, round, and reactive to light.     Comments:  Moist conjunctiva  Neck:     Thyroid: No thyromegaly.  Trachea: No tracheal tenderness.     Comments: No cervical lymphadenopathy Cardiovascular:     Rate and Rhythm: Normal rate.      Heart sounds: No murmur heard. Pulmonary:     Effort: She is intubated.     Breath sounds: Normal breath sounds. No wheezing or rales.  Abdominal:     Tenderness: There is no abdominal tenderness.     Hernia: No hernia is present.  Skin:    General: Skin is warm.     Findings: No rash.     Nails: There is no clubbing.     Comments: Normal skin turgor  Neurological:     Mental Status: She is unresponsive.     Comments: Normal gait and station     Assessment/Plan: 67 year old female with ischemic bowel. HTN COPD DLD CHF CRF  Based on the patient's CTA, endoscopy, and worsening clinical picture it appears the patient does have complete ischemic bowel.  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  I do not feel that this is a salvageable situation.  Proceeding to the OR would likely result in diagnostic findings of complete small bowel ischemia.  At this time I would recommend comfort care measures.  I did call and speak with the patient's son Mr. Nechuma Faubion.  I discussed with him the situation and my findings and recommendations.  I spoke with Dr. Michae Kava in regards to the discussion and my findings.  CC time : 32 min  Axel Filler 07/23/2023, 5:48 PM

## 2023-07-11 NOTE — Progress Notes (Signed)
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 No surgical option. Progressive MSOF.  Spoke with daughter. She agrees to DNR, no escalation. Will transition to comfort with compassionate extubation once family is ready.   Will temporize hyperkalemia in the meantime. No CRRT.   Lynnell Catalan, MD [facility] ICU Physician [facility]  Pager: [phone] Or Epic Secure Chat After hours: [phone].  07/28/2023, 5:58 PM

## 2023-07-14 LAB — TRANSFUSION REACTION
DAT C3: NEGATIVE
Post RXN DAT IgG: NEGATIVE

## 2023-07-16 ENCOUNTER — Encounter (HOSPITAL_COMMUNITY): Payer: Self-pay | Admitting: Gastroenterology

## 2023-08-03 ENCOUNTER — Ambulatory Visit: Payer: Medicare Other | Admitting: Cardiology

## 2023-08-08 NOTE — Discharge Summary (Signed)
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 Secondary Diagnoses (including complications and co-morbidities):  Principal Problem:   GI bleed Active Problems:   COPD (chronic obstructive pulmonary disease) (HCC)   Peripheral artery disease (HCC)   Normocytic anemia   Essential hypertension   Hypothyroidism   Systolic CHF (HCC)   Hyperlipidemia   Resistant hypertension   Aortic valve stenosis   Chronic heart failure with preserved ejection fraction (HFpEF) (HCC)   Heme positive stool   Nausea without vomiting   Iron deficiency anemia   Symptomatic anemia   Brief Hospital Course (including significant findings, care, treatment, and services provided and events leading to death)  Gwendolyn [name] is a 66 y.o. year old female who presented with abdominal pain.  She was found to be anemic and was given an unit of PRBCs after which she developed sudden dyspnea with hypertension started on Cleviprex and placed on BiPAP and given furosemide for his presumed circulatory overload.  She continued to have respiratory distress with increased work of breathing requiring BiPAP for which she was admitted to the ICU.  She has a history of hypertension hyperlipidemia moderate aortic stenosis HFpEF with grade 2 diastolic dysfunction tobacco abuse with COPD.  She has a history of prior GI bleed recently admitted last month with endoscopic intervention to what was thought to be a colonic source of bleeding.  She continued to worsen while in the ICU and became less responsive.  Saline investigation showed marked metabolic  acidosis with lactate greater than 9.  Worsening AKI.  Worsening LFTs.  She had marked leukocytosis at 34.  The clinical picture was felt to be consistent with ischemic bowel and she therefore underwent CT angiography  of the abdomen which revealed extensive atherosclerotic disease and apparent lack of mesenteric flow.  An upper endoscopy performed by Dr. Renaldo Reel revealed a dusky proximal duodenum consistent with intestinal ischemia. She was seen by general surgery who did not deem this to be a salvageable situation.  The findings of the endoscopy CT scan and clinical condition were discussed with the family who opted to proceed to comfort care given that the patient would have inevitably succumbed to to her illness..  Pertinent Labs and Studies  Significant Diagnostic Studies CT Angio Abd/Pel w/ and/or w/o  Result Date: 07-19-2023 CLINICAL DATA:  67 year old female with concern for mesenteric ischemia. EXAM: CT ANGIOGRAPHY ABDOMEN AND PELVIS WITH CONTRAST AND WITHOUT CONTRAST TECHNIQUE: Multidetector CT imaging of the abdomen and pelvis was performed using the standard protocol during bolus administration of intravenous contrast. Multiplanar reconstructed images and MIPs were obtained and reviewed to evaluate the vascular anatomy. RADIATION DOSE REDUCTION: This exam was performed according to the departmental dose-optimization program which includes automated exposure control, adjustment of the mA and/or kV according to patient size and/or use of iterative reconstruction technique. CONTRAST:  OMNIPAQUE IOHEXOL 350 MG/ML SOLN COMPARISON:  05/28/2023 FINDINGS: VASCULAR Aorta: Extensive coarse fibrofatty and calcific atherosclerotic changes throughout the abdominal aorta, most prominent at the level of the renal artery ostia were there is severe focal stenosis. Celiac: Moderate ostial stenosis secondary to atherosclerotic plaque. SMA:  There is a new proximal occlusion spanning approximately 4.5 cm with  diminutive, distal reconstitution. Renals: The single bilateral renal arteries are diminutive bilaterally, significantly worse than comparison. IMA: Patent without evidence of aneurysm, dissection, vasculitis or significant stenosis. Inflow: Coarse atherosclerotic calcifications extend to the common iliac arteries bilaterally. The right common, internal common external iliac arteries are diminutive however appear patent. The left common iliac artery is normal in caliber with multifocal moderate stenoses. The left internal external iliac arteries are diminutive throughout, however patent. Proximal Outflow: Bilateral common femoral and visualized portions of the superficial and profunda femoral arteries are patent, however diminutive. Veins: The hepatic veins are widely patent. The portal system is widely patent and normal in caliber. The renal veins are patent bilaterally in standard anatomic configuration. No evidence of iliocaval thrombosis or anomaly. Review of the MIP images confirms the above findings. NON-VASCULAR Lower chest: Small right and trace left pleural effusions, increased from comparison with associated bibasilar passive atelectasis. Unchanged global cardiomegaly. Hepatobiliary: No focal liver abnormality is seen. Status post cholecystectomy. No biliary dilatation. Pancreas: Unremarkable. No pancreatic ductal dilatation or surrounding inflammatory changes. Spleen: Normal in size without focal abnormality. Adrenals/Urinary Tract: Adrenal glands are unremarkable. Unchanged large, right greater than left nonobstructive bilateral renal calculi. The kidneys are otherwise normal without focal lesion or hydronephrosis. Bladder is decompressed with Foley catheter in place. Stomach/Bowel: Stomach is within normal limits with gastric decompression tube coiled in the body. There is minimal mild dilation of the small bowel without evidence of transition point with scattered air-fluid levels. Appendix is not  definitively identified. No evidence of bowel wall thickening or inflammatory changes. Lymphatic: No abdominopelvic lymphadenopathy. Reproductive: Status post hysterectomy. No adnexal masses. Other: No abdominal wall hernia or abnormality. No abdominopelvic ascites. Musculoskeletal: No acute or significant osseous findings. IMPRESSION: 1. Interval progression of "coral reef" coarse atherosclerotic changes about the abdominal aorta resulting in subacute to chronic appearing attenuation of the bilateral renal arteries and occlusion of the proximal superior mesenteric artery. Given unchanged moderate ostial stenosis of the celiac, these findings support clinical diagnosis of mesenteric ischemia, with vascular morphology suggestive of subacute to chronic onset. 2. No evidence of pneumatosis.  Suggestion of ileus. 3. Enlarging small right and trace left pleural effusions with associated bibasal passive atelectasis. 4. Unchanged cardiomegaly. 5. Unchanged nonobstructive bilateral renal calculi. Marliss Coots, MD [facility] Radiology Electronically Signed   By: Marliss Coots M.D.   On: 07/18/2023 17:01   DG CHEST PORT 1 VIEW  Result Date: 08/03/2023 CLINICAL DATA:  Endotracheal tube placement. EXAM: PORTABLE CHEST 1 VIEW COMPARISON:  July 10, 2023. FINDINGS: Endotracheal tube is in grossly good position. Nasogastric tube is seen entering stomach. Stable cardiomegaly. Bibasilar atelectasis or infiltrates are noted with associated effusions. IMPRESSION: Endotracheal and nasogastric tubes are in grossly good position. Bibasilar opacities as noted above. Electronically Signed   By: Lupita Raider M.D.   On: 07/30/2023 15:59   DG Abd Portable 1V  Result Date: 07/29/2023 CLINICAL DATA:  Abdominal pain.  Tube placement. EXAM: PORTABLE ABDOMEN - 1 VIEW COMPARISON:  April 20, 2007. FINDINGS: The bowel gas pattern is normal. Distal tip of nasogastric tube is seen in proximal  stomach. No radio-opaque calculi or other significant radiographic abnormality are seen. IMPRESSION: Distal tip of nasogastric tube is seen in proximal stomach. Electronically Signed   By: Lupita Raider M.D.   On: 07/18/2023 15:58   DG CHEST PORT 1 VIEW  Result Date: 07/24/2023  CLINICAL DATA:  Transfusion associated circulatory overload. EXAM: PORTABLE CHEST 1 VIEW COMPARISON:  Same day chest radiograph. FINDINGS: The heart is enlarged. Vascular calcifications are seen in the aortic arch. There is a small left pleural effusion with associated atelectasis/airspace disease, unchanged. Mild-to-moderate bilateral interstitial and airspace opacities have increased since prior exam. No pneumothorax. Degenerative changes are seen in the spine. IMPRESSION: Increased bilateral interstitial and airspace opacities may reflect pulmonary edema. Unchanged small left pleural effusion with associated atelectasis/airspace disease. Electronically Signed   By: Romona Curls M.D.   On: 07/23/2023 20:43   DG Chest 2 View  Result Date: 07/21/2023 CLINICAL DATA:  Shortness of breath EXAM: CHEST - 2 VIEW COMPARISON:  Chest radiograph dated 05/31/2023. FINDINGS: The heart is enlarged. Vascular calcifications are seen in the aortic arch. There is a small left pleural effusion with associated atelectasis, decreased compared to prior exam. There is no right pleural effusion and the right lung is clear. There is no pneumothorax on either side. Degenerative changes are seen in the spine. IMPRESSION: Small left pleural effusion with associated atelectasis, decreased compared to prior exam. Electronically Signed   By: Romona Curls M.D.   On: 07/13/2023 14:11    Microbiology Recent Results (from the past 240 hour(s))  MRSA Next Gen by PCR, Nasal     Status: None   Collection Time: 07/28/2023 10:19 PM   Specimen: Nasal Mucosa; Nasal Swab  Result Value Ref Range Status   MRSA by PCR Next Gen NOT DETECTED NOT DETECTED Final    Comment:  (NOTE) The GeneXpert MRSA Assay (FDA approved for NASAL specimens only), is one component of a comprehensive MRSA colonization surveillance program. It is not intended to diagnose MRSA infection nor to guide or monitor treatment for MRSA infections. Test performance is not FDA approved in patients less than 71 years old. Performed at [facility], [address], [place], [place] [address]     Lab Basic Metabolic Panel: Recent Labs  Lab 08/04/2023 1256 07/24/2023 2135 07/13/2023 0444 07/17/2023 0714 07/17/2023 0835 08/06/2023 1142 07/31/2023 1403 07/27/2023 1532  NA 131* 127* 126* 131* 133* 131* 132* 133*  K 3.2* 5.2* 7.1* 5.6* 5.8* 6.6* 6.5* 7.0*  CL 95* 97* 96*  --  98 94*  --  93*  CO2 23 17* 8*  --  8* 8*  --  12*  GLUCOSE 118* 284* 384*  --  95 100*  --  74  BUN 17 23 25*  --  26* 26*  --  29*  CREATININE 0.97 1.49* 2.37*  --  2.47* 2.69*  --  2.84*  CALCIUM 8.7* 8.4* 8.0*  --  8.7* 8.2*  --  7.5*  MG 1.8  --   --   --   --   --   --   --   PHOS  --   --   --   --   --   --   --  >30.0*   Liver Function Tests: Recent Labs  Lab 07/17/2023 1256 07/23/2023 0444 08/01/2023 1532  AST 24 910*  --   ALT 21 972*  --   ALKPHOS 40 59  --   BILITOT 0.8 3.1*  --   PROT 6.1* 5.0*  --   ALBUMIN 3.9 3.0* 2.3*   Recent Labs  Lab 08/03/2023 1256  LIPASE 36   No results for input(s): "AMMONIA" in the last 168 hours. CBC: Recent Labs  Lab 07/09/2023 1256 07/23/2023 2333 07/20/2023 0221 07/13/2023  1610 07/16/2023 0714 07/13/2023 1403  WBC 10.5  --  35.5* 33.3*  32.9*  --   --   NEUTROABS  --   --   --  31.3*  --   --   HGB 5.8* 9.1* 8.9* 7.6*  7.3* 7.5* 6.5*  HCT 18.9* 28.1* 29.1* 25.1*  24.1* 22.0* 19.0*  MCV 88.7  --  93.0 96.9  94.9  --   --   PLT 246  --  157 116*  119*  --   --    Cardiac Enzymes: No results for input(s): "CKTOTAL", "CKMB", "CKMBINDEX", "TROPONINI" in the last 168 hours. Sepsis Labs: Recent Labs  Lab 08/07/2023 1256 07/22/2023 1310 07/23/2023 0221 07/19/2023 0444  07/26/2023 1142 07/21/2023 1432  WBC 10.5  --  35.5* 33.3*  32.9*  --   --   LATICACIDVEN  --  1.0  --   --  >9.0* >9.0*    Procedures/Operations  Mechanical ventilation, intubation.  Upper endoscopy.   Treasure Ochs 07/28/2023, 3:00 PM

## 2023-08-08 DEATH — deceased

## 2023-10-22 ENCOUNTER — Inpatient Hospital Stay (HOSPITAL_COMMUNITY): Admit: 2023-10-22 | Payer: Medicare Other

## 2024-02-22 IMAGING — US US RENAL
1 series · 14 of 25 positions shown · non-contrast
Comparison: 02/18/2016 abdomen ultrasound

CLINICAL DATA: Acute kidney injury

EXAM:
RENAL / URINARY TRACT ULTRASOUND COMPLETE

[Series 1: us renal · 14 of 51 slices shown]
[im 1/51]
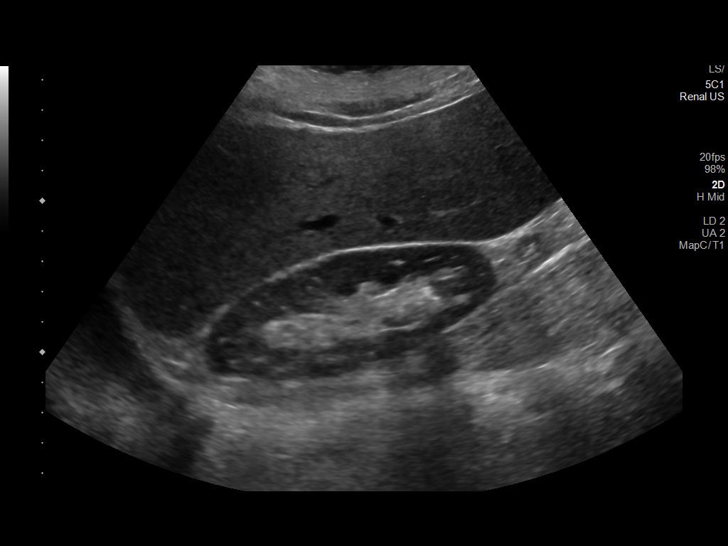
[im 5/51]
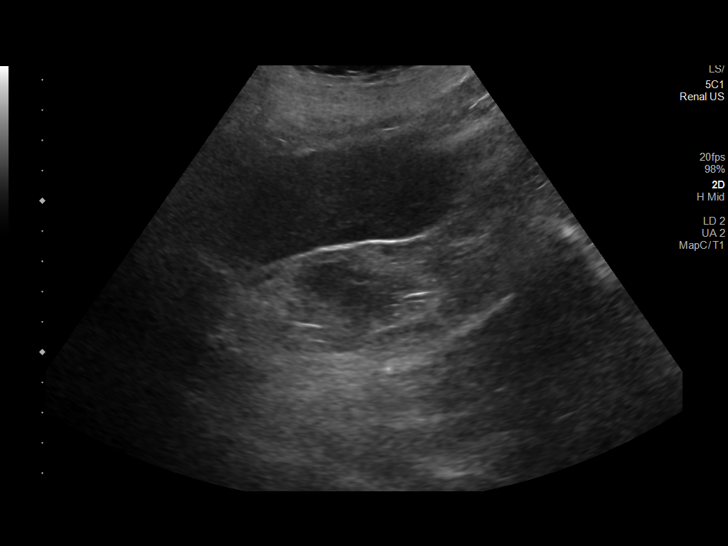
[im 9/51]
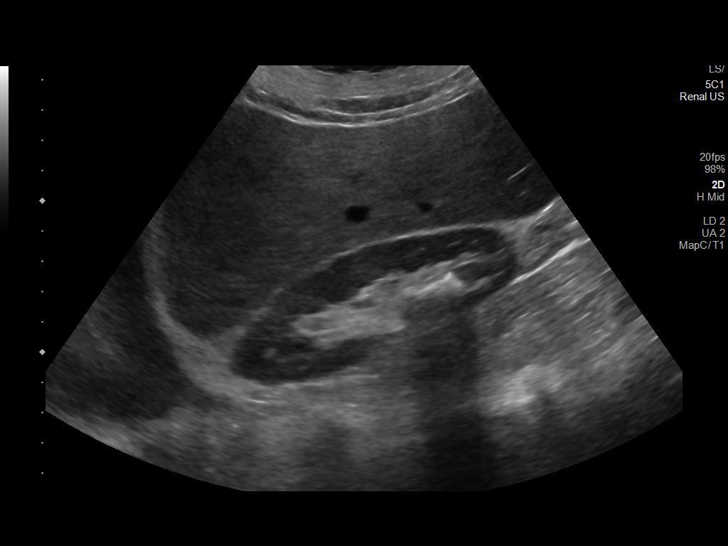
[im 13/51]
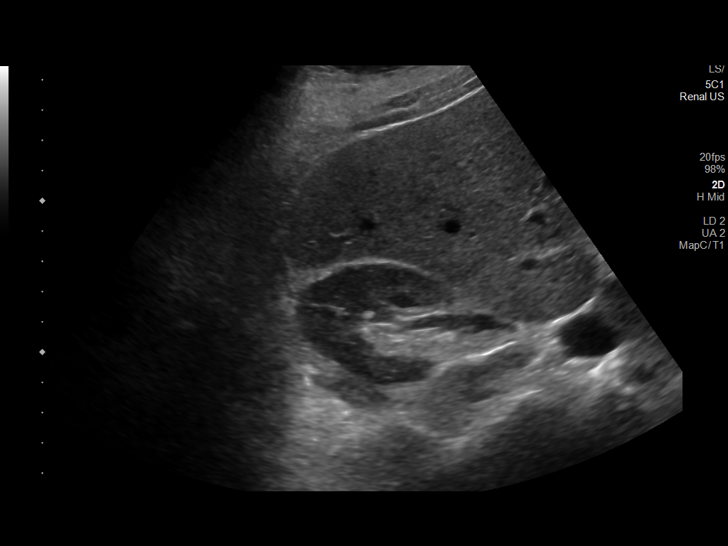
[im 17/51]
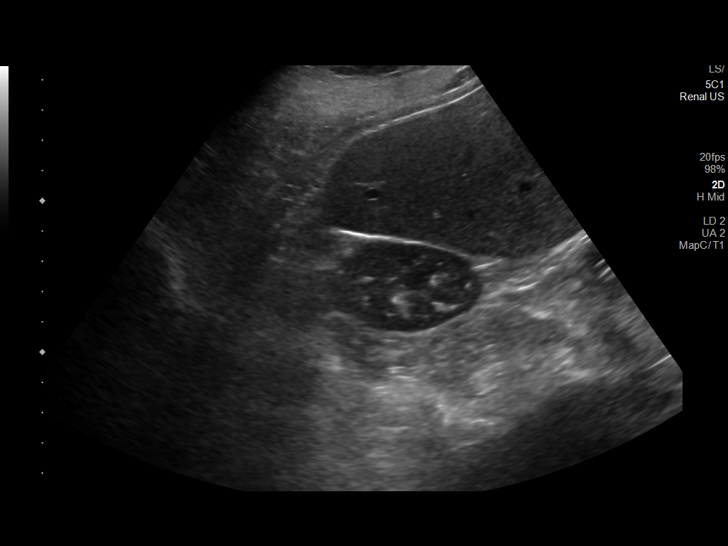
[im 19/51]
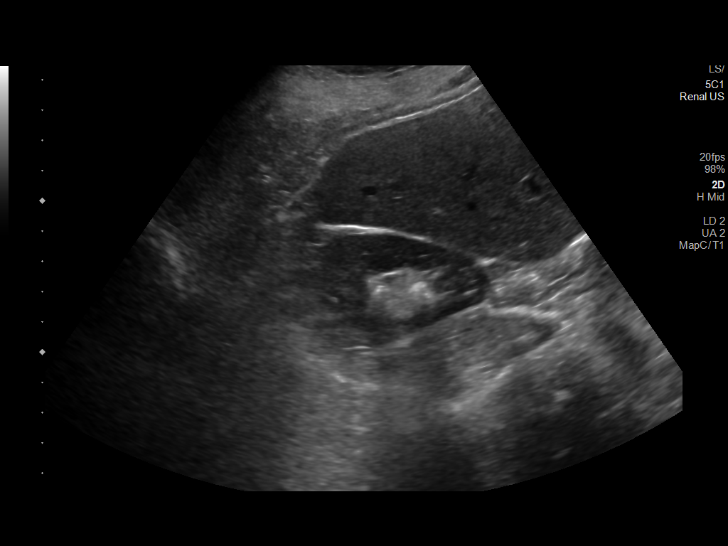
[im 23/51]
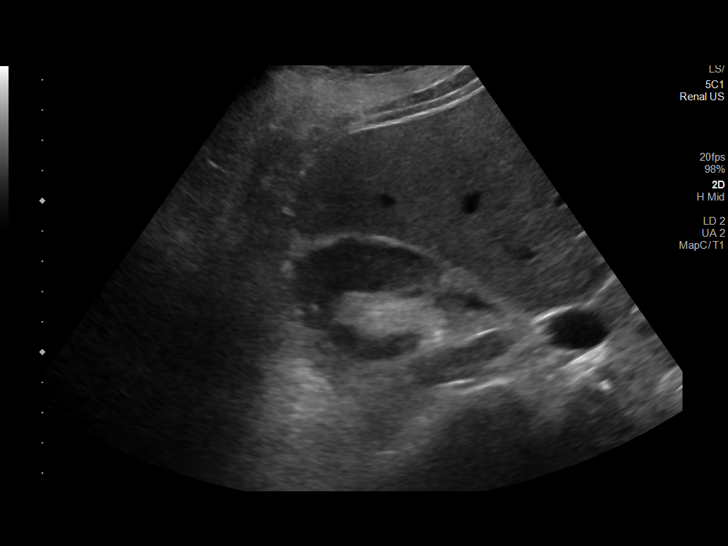
[im 28/51]
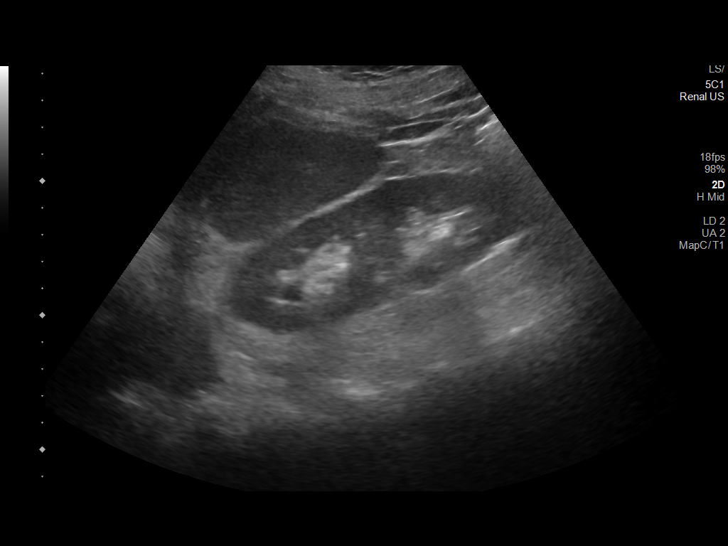
[im 32/51]
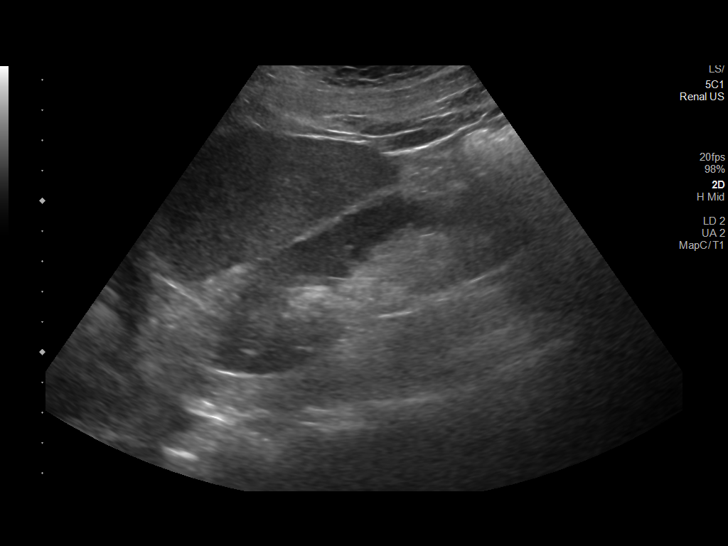
[im 34/51]
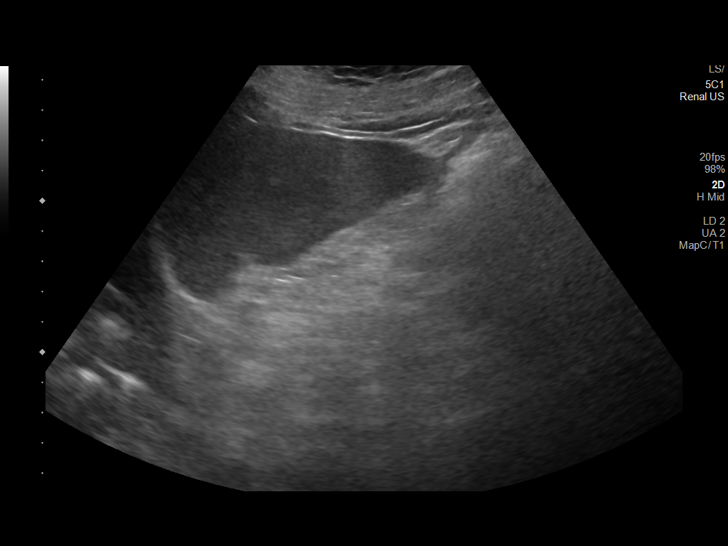
[im 38/51]
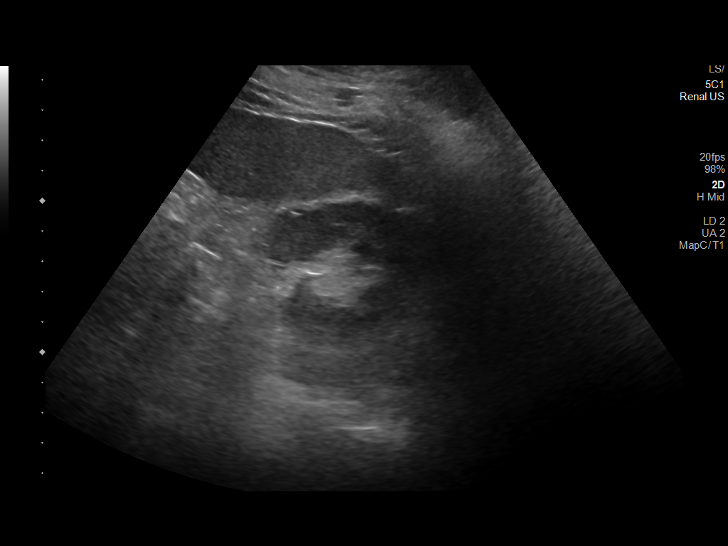
[im 42/51]
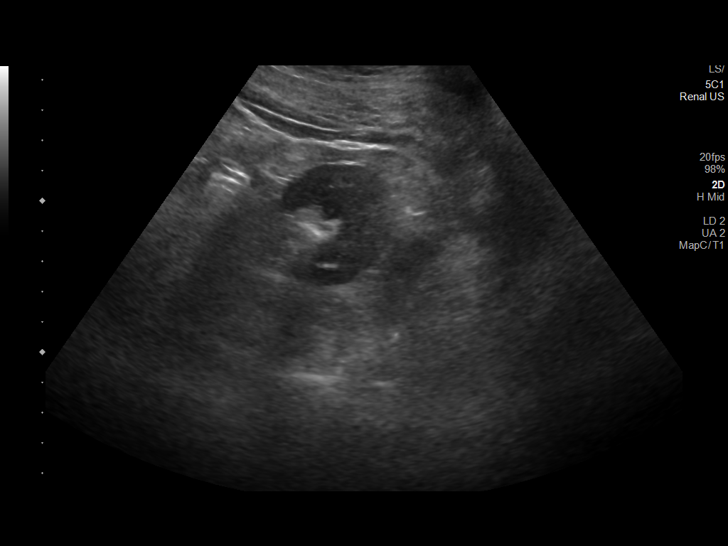
[im 46/51]
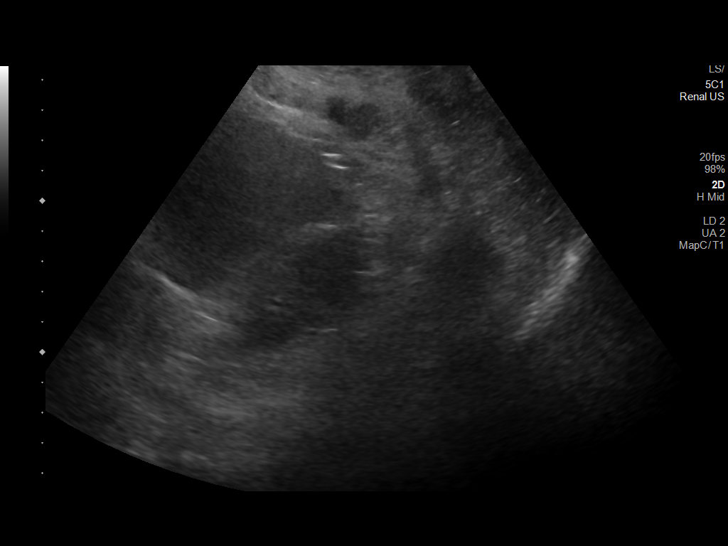
[im 51/51]
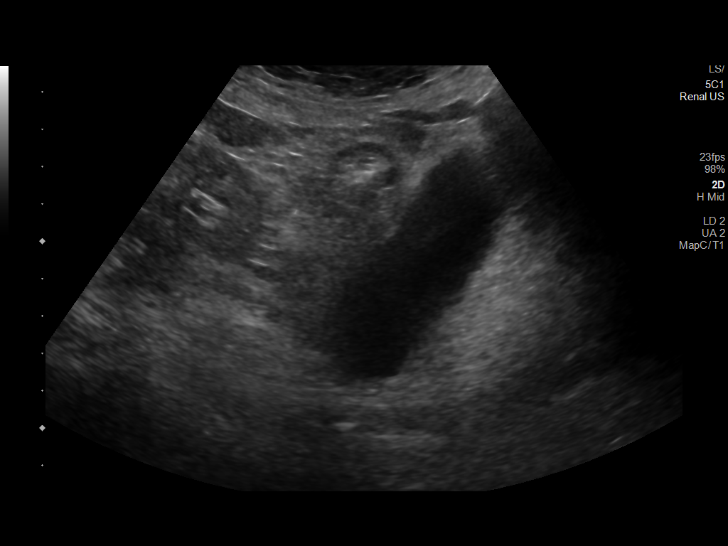

[14 of 25 positions shown; findings below may reference images not displayed]

FINDINGS: Right Kidney:

Renal measurements: 10.6 x 3.9 x 4.5 cm = volume: 98 mL.
Echogenicity within normal limits. No mass or hydronephrosis
visualized. Shadowing, nonobstructing calculi, measuring up to
cm.

Left Kidney:

Renal measurements: 11.9 x 3.8.3 cm = volume: 102 mL. Increased
echogenicity. No mass or hydronephrosis visualized. Shadowing,
nonobstructing calculi, measuring up to 1.2 cm.

Bladder:

Appears normal for degree of bladder distention.

Other:

None.
IMPRESSION: 1. Increased echogenicity in the left kidney, as can be seen with
medical renal disease.
2. Nonobstructing calculi in the bilateral kidneys, measuring up to
1.8 cm on the right.

## 2024-02-23 IMAGING — DX DG CHEST 1V
1 series · 1 of 1 positions shown · non-contrast
Comparison: Chest x-ray 02/01/2022

CLINICAL DATA: Shortness of breath

EXAM:
CHEST  1 VIEW

[chest ap]
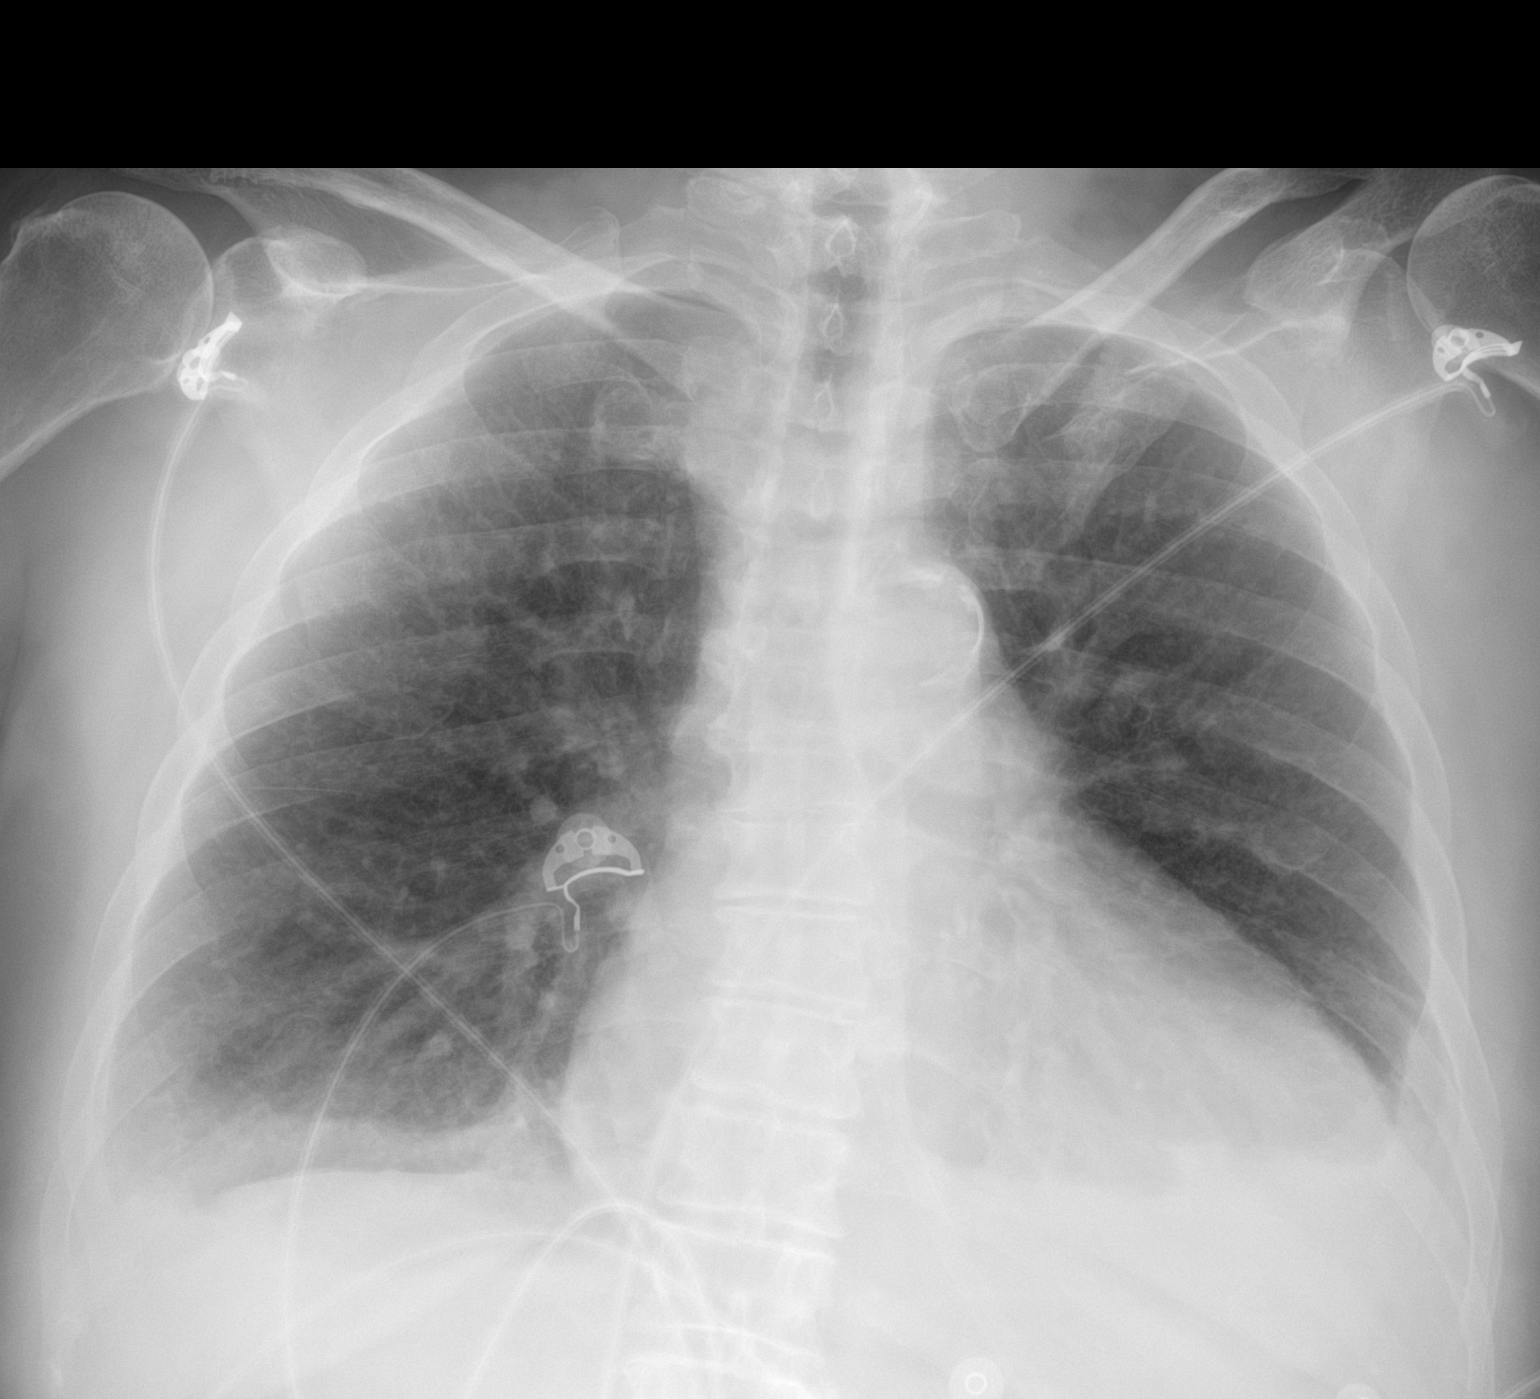

[1 of 1 positions shown; findings below may reference images not displayed]

FINDINGS: Cardiomegaly and mediastinum appear stable. Calcified plaques in the
aortic arch. Mild central pulmonary vascular prominence, improved
since previous study. Small bilateral pleural effusions with
associated atelectasis. No pneumothorax.
IMPRESSION: Interval improved pulmonary vascular congestion. Small bilateral
pleural effusions.
